# Patient Record
Sex: Male | Born: 1960 | Race: White | Hispanic: No | Marital: Married | Smoking: Former smoker
Health system: Southern US, Community
[De-identification: ages and names within clinical notes are randomized; demographics above are authoritative.]

## PROBLEM LIST (undated history)

## (undated) DIAGNOSIS — I10 Essential (primary) hypertension: Secondary | ICD-10-CM

## (undated) DIAGNOSIS — J309 Allergic rhinitis, unspecified: Secondary | ICD-10-CM

## (undated) DIAGNOSIS — F329 Major depressive disorder, single episode, unspecified: Secondary | ICD-10-CM

## (undated) DIAGNOSIS — I639 Cerebral infarction, unspecified: Secondary | ICD-10-CM

## (undated) DIAGNOSIS — F988 Other specified behavioral and emotional disorders with onset usually occurring in childhood and adolescence: Secondary | ICD-10-CM

## (undated) DIAGNOSIS — N189 Chronic kidney disease, unspecified: Secondary | ICD-10-CM

## (undated) HISTORY — DX: Other specified behavioral and emotional disorders with onset usually occurring in childhood and adolescence: F98.8

## (undated) HISTORY — DX: Allergic rhinitis, unspecified: J30.9

---
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
## 2001-11-27 HISTORY — PX: INGUINAL HERNIA REPAIR: SUR1180

## 2002-10-20 ENCOUNTER — Ambulatory Visit (HOSPITAL_COMMUNITY): Admission: RE | Admit: 2002-10-20 | Discharge: 2002-10-20 | Payer: Self-pay | Admitting: General Surgery

## 2004-12-11 ENCOUNTER — Observation Stay (HOSPITAL_COMMUNITY): Admission: EM | Admit: 2004-12-11 | Discharge: 2004-12-12 | Payer: Self-pay | Admitting: Emergency Medicine

## 2012-09-06 ENCOUNTER — Encounter: Payer: Self-pay | Admitting: Internal Medicine

## 2012-09-06 DIAGNOSIS — J309 Allergic rhinitis, unspecified: Secondary | ICD-10-CM | POA: Insufficient documentation

## 2012-09-06 DIAGNOSIS — F988 Other specified behavioral and emotional disorders with onset usually occurring in childhood and adolescence: Secondary | ICD-10-CM | POA: Insufficient documentation

## 2012-09-06 DIAGNOSIS — Z0000 Encounter for general adult medical examination without abnormal findings: Secondary | ICD-10-CM | POA: Insufficient documentation

## 2012-09-20 ENCOUNTER — Encounter: Payer: Self-pay | Admitting: Internal Medicine

## 2012-09-20 ENCOUNTER — Other Ambulatory Visit (INDEPENDENT_AMBULATORY_CARE_PROVIDER_SITE_OTHER): Payer: BC Managed Care – PPO

## 2012-09-20 ENCOUNTER — Ambulatory Visit (INDEPENDENT_AMBULATORY_CARE_PROVIDER_SITE_OTHER): Payer: BC Managed Care – PPO | Admitting: Internal Medicine

## 2012-09-20 VITALS — BP 122/90 | HR 96 | Temp 97.0°F | Ht 72.0 in | Wt 231.4 lb

## 2012-09-20 DIAGNOSIS — Z0000 Encounter for general adult medical examination without abnormal findings: Secondary | ICD-10-CM

## 2012-09-20 DIAGNOSIS — Z8349 Family history of other endocrine, nutritional and metabolic diseases: Secondary | ICD-10-CM | POA: Insufficient documentation

## 2012-09-20 DIAGNOSIS — R5383 Other fatigue: Secondary | ICD-10-CM

## 2012-09-20 DIAGNOSIS — Z23 Encounter for immunization: Secondary | ICD-10-CM

## 2012-09-20 DIAGNOSIS — R5381 Other malaise: Secondary | ICD-10-CM

## 2012-09-20 LAB — BASIC METABOLIC PANEL
BUN: 16 mg/dL (ref 6–23)
Calcium: 9.3 mg/dL (ref 8.4–10.5)
GFR: 76.84 mL/min (ref >60.00)
Glucose, Bld: 80 mg/dL (ref 70–99)

## 2012-09-20 LAB — CBC WITH DIFFERENTIAL/PLATELET
Eosinophils Relative: 3.1 % (ref 0.0–5.0)
HCT: 49.5 % (ref 39.0–52.0)
Hemoglobin: 16.4 g/dL (ref 13.0–17.0)
Lymphs Abs: 2.3 10*3/uL (ref 0.7–4.0)
Monocytes Relative: 10.3 % (ref 3.0–12.0)
Platelets: 242 10*3/uL (ref 150.0–400.0)
WBC: 8.4 10*3/uL (ref 4.5–10.5)

## 2012-09-20 LAB — LIPID PANEL
Cholesterol: 176 mg/dL (ref 0–200)
HDL: 33.8 mg/dL — ABNORMAL LOW (ref >39.00)
Triglycerides: 174 mg/dL — ABNORMAL HIGH (ref 0.0–149.0)
VLDL: 34.8 mg/dL (ref 0.0–40.0)

## 2012-09-20 LAB — URINALYSIS, ROUTINE W REFLEX MICROSCOPIC
Ketones, ur: NEGATIVE
Leukocytes, UA: NEGATIVE
Nitrite: NEGATIVE
Specific Gravity, Urine: 1.02 (ref 1.000–1.030)
pH: 6.5 (ref 5.0–8.0)

## 2012-09-20 LAB — TSH: TSH: 1.23 u[IU]/mL (ref 0.35–5.50)

## 2012-09-20 LAB — IBC PANEL: Iron: 82 ug/dL (ref 42–165)

## 2012-09-20 LAB — PSA: PSA: 0.6 ng/mL (ref 0.10–4.00)

## 2012-09-20 LAB — HEPATIC FUNCTION PANEL
AST: 20 U/L (ref 0–37)
Albumin: 3.9 g/dL (ref 3.5–5.2)

## 2012-09-20 MED ORDER — ASPIRIN 81 MG PO TBEC: 81.0000 mg | DELAYED_RELEASE_TABLET | Freq: Every day | ORAL | Status: DC

## 2012-09-20 NOTE — Assessment & Plan Note (Signed)

## 2012-09-20 NOTE — Assessment & Plan Note (Addendum)
Father history - Ok to check iron/ferritin

## 2012-09-20 NOTE — Assessment & Plan Note (Signed)
Etiology unclear, Exam otherwise benign, to check labs as documented, follow with expectant management, for testosterone check

## 2012-09-20 NOTE — Patient Instructions (Addendum)
Please start the Aspirin 81 mg - 1 per day - Coated only Continue all other medications as before Please keep your appointments with your specialists as you have planned - Dr Woodfin Ganja had the flu shot and tetanus shots today You will be contacted regarding the referral for: colonoscopy Please go to LAB in the Basement for the blood and/or urine tests to be done today You will be contacted by phone if any changes need to be made immediately.  Otherwise, you will receive a letter about your results with an explanation. Please remember to sign up for My Chart at your earliest convenience, as this will be important to you in the future with finding out test results. Please return in 1 year for your yearly visit, or sooner if needed, with Lab testing done 3-5 days before

## 2012-09-21 ENCOUNTER — Encounter: Payer: Self-pay | Admitting: Internal Medicine

## 2012-09-21 NOTE — Progress Notes (Signed)
Subjective:    Patient ID: Micheal Ayers, male    DOB: May 31, 1961, 51 y.o.   MRN: [id number]  HPI Here for wellness and f/u;  Overall doing ok;  Pt denies CP, worsening SOB, DOE, wheezing, orthopnea, PND, worsening LE edema, palpitations, dizziness or syncope.  Pt denies neurological change such as new Headache, facial or extremity weakness.  Pt denies polydipsia, polyuria, or low sugar symptoms. Pt states overall good compliance with treatment and medications, good tolerability, and trying to follow lower cholesterol diet.  Pt denies worsening depressive symptoms, suicidal ideation or panic. No fever, wt loss, night sweats, loss of appetite, or other constitutional symptoms.  Pt states good ability with ADL's, low fall risk, home safety reviewed and adequate, no significant changes in hearing or vision, and occasionally active with exercise.  Has not seen an MD for many yrs, except Dr Evelene Croon for ADD.  Does have sense of ongoing fatigue, but denies signficant hypersomnolence. Past Medical History  Diagnosis Date  . ADD (attention deficit disorder)   . Allergic rhinitis, cause unspecified    Past Surgical History  Procedure Date  . Elbow surgury 1969    left elbow  . Inguinal hernia repair 2003    left    reports that he has quit smoking. He has never used smokeless tobacco. He reports that he drinks alcohol. He reports that he does not use illicit drugs. family history includes Alcohol abuse in his others; Arthritis in his other; Cancer in his others; Hypertension in his others and unspecified family member; Kidney disease in his other; and Stroke in his other. Allergies  Allergen Reactions  . Erythromycin    Current Outpatient Prescriptions on File Prior to Visit  Medication Sig Dispense Refill  . atomoxetine (STRATTERA) 60 MG capsule Take 60 mg by mouth 2 (two) times daily.       Review of Systems Review of Systems  Constitutional: Negative for diaphoresis, activity change, appetite  change and unexpected weight change.  HENT: Negative for hearing loss, ear pain, facial swelling, mouth sores and neck stiffness.   Eyes: Negative for pain, redness and visual disturbance.  Respiratory: Negative for shortness of breath and wheezing.   Cardiovascular: Negative for chest pain and palpitations.  Gastrointestinal: Negative for diarrhea, blood in stool, abdominal distention and rectal pain.  Genitourinary: Negative for hematuria, flank pain and decreased urine volume.  Musculoskeletal: Negative for myalgias and joint swelling.  Skin: Negative for color change and wound.  Neurological: Negative for syncope and numbness.  Hematological: Negative for adenopathy.  Psychiatric/Behavioral: Negative for hallucinations, self-injury, decreased concentration and agitation.      Objective:   Physical Exam BP 122/90  Pulse 96  Temp 97 F (36.1 C) (Oral)  Ht 6' (1.829 m)  Wt 231 lb 6 oz (104.951 kg)  BMI 31.38 kg/m2  SpO2 97% Physical Exam  VS noted Constitutional: Pt is oriented to person, place, and time. Appears well-developed and well-nourished.  HENT:  Head: Normocephalic and atraumatic.  Right Ear: External ear normal.  Left Ear: External ear normal.  Nose: Nose normal.  Mouth/Throat: Oropharynx is clear and moist.  Eyes: Conjunctivae and EOM are normal. Pupils are equal, round, and reactive to light.  Neck: Normal range of motion. Neck supple. No JVD present. No tracheal deviation present.  Cardiovascular: Normal rate, regular rhythm, normal heart sounds and intact distal pulses.   Pulmonary/Chest: Effort normal and breath sounds normal.  Abdominal: Soft. Bowel sounds are normal. There is no tenderness.  Musculoskeletal: Normal range of motion. Exhibits no edema.  Lymphadenopathy:  Has no cervical adenopathy.  Neurological: Pt is alert and oriented to person, place, and time. Pt has normal reflexes. No cranial nerve deficit.  Skin: Skin is warm and dry. No rash noted.    Psychiatric:  Has  normal mood and affect. Behavior is normal.     Assessment & Plan:

## 2013-09-22 ENCOUNTER — Encounter: Payer: BC Managed Care – PPO | Admitting: Internal Medicine

## 2017-02-09 DIAGNOSIS — F900 Attention-deficit hyperactivity disorder, predominantly inattentive type: Secondary | ICD-10-CM | POA: Diagnosis not present

## 2017-06-15 DIAGNOSIS — E291 Testicular hypofunction: Secondary | ICD-10-CM | POA: Diagnosis not present

## 2017-06-20 DIAGNOSIS — E291 Testicular hypofunction: Secondary | ICD-10-CM | POA: Diagnosis not present

## 2017-07-06 DIAGNOSIS — E291 Testicular hypofunction: Secondary | ICD-10-CM | POA: Diagnosis not present

## 2017-08-14 DIAGNOSIS — Z23 Encounter for immunization: Secondary | ICD-10-CM | POA: Diagnosis not present

## 2017-08-14 DIAGNOSIS — Z79899 Other long term (current) drug therapy: Secondary | ICD-10-CM | POA: Diagnosis not present

## 2017-08-14 DIAGNOSIS — I10 Essential (primary) hypertension: Secondary | ICD-10-CM | POA: Diagnosis not present

## 2017-08-14 DIAGNOSIS — E291 Testicular hypofunction: Secondary | ICD-10-CM | POA: Diagnosis not present

## 2018-01-31 DIAGNOSIS — F900 Attention-deficit hyperactivity disorder, predominantly inattentive type: Secondary | ICD-10-CM | POA: Diagnosis not present

## 2018-05-02 DIAGNOSIS — M79606 Pain in leg, unspecified: Secondary | ICD-10-CM | POA: Diagnosis not present

## 2018-05-02 DIAGNOSIS — M25551 Pain in right hip: Secondary | ICD-10-CM | POA: Diagnosis not present

## 2018-05-02 DIAGNOSIS — R5383 Other fatigue: Secondary | ICD-10-CM | POA: Diagnosis not present

## 2018-05-02 DIAGNOSIS — M6281 Muscle weakness (generalized): Secondary | ICD-10-CM | POA: Diagnosis not present

## 2018-05-07 ENCOUNTER — Other Ambulatory Visit: Payer: Self-pay | Admitting: Family Medicine

## 2018-05-07 DIAGNOSIS — R5381 Other malaise: Secondary | ICD-10-CM

## 2018-05-07 DIAGNOSIS — Z87891 Personal history of nicotine dependence: Secondary | ICD-10-CM

## 2018-05-13 ENCOUNTER — Ambulatory Visit
Admission: RE | Admit: 2018-05-13 | Discharge: 2018-05-13 | Disposition: A | Discharge status: Home / Self Care | Payer: BLUE CROSS/BLUE SHIELD | Source: Ambulatory Visit | Attending: Nurse Practitioner | Admitting: Nurse Practitioner

## 2018-05-13 DIAGNOSIS — I714 Abdominal aortic aneurysm, without rupture: Secondary | ICD-10-CM | POA: Diagnosis not present

## 2018-05-13 DIAGNOSIS — Z87891 Personal history of nicotine dependence: Secondary | ICD-10-CM

## 2018-05-14 DIAGNOSIS — M722 Plantar fascial fibromatosis: Secondary | ICD-10-CM | POA: Diagnosis not present

## 2018-05-14 DIAGNOSIS — R202 Paresthesia of skin: Secondary | ICD-10-CM | POA: Diagnosis not present

## 2018-05-14 DIAGNOSIS — R29898 Other symptoms and signs involving the musculoskeletal system: Secondary | ICD-10-CM | POA: Diagnosis not present

## 2018-05-14 DIAGNOSIS — M79604 Pain in right leg: Secondary | ICD-10-CM | POA: Diagnosis not present

## 2018-05-15 ENCOUNTER — Other Ambulatory Visit: Payer: Self-pay | Admitting: Family Medicine

## 2018-05-17 ENCOUNTER — Other Ambulatory Visit: Payer: Self-pay | Admitting: Family Medicine

## 2018-05-17 DIAGNOSIS — R29898 Other symptoms and signs involving the musculoskeletal system: Secondary | ICD-10-CM

## 2018-06-06 DIAGNOSIS — M79606 Pain in leg, unspecified: Secondary | ICD-10-CM | POA: Diagnosis not present

## 2018-06-06 DIAGNOSIS — R030 Elevated blood-pressure reading, without diagnosis of hypertension: Secondary | ICD-10-CM | POA: Diagnosis not present

## 2018-06-09 ENCOUNTER — Ambulatory Visit
Admission: RE | Admit: 2018-06-09 | Discharge: 2018-06-09 | Disposition: A | Discharge status: Home / Self Care | Payer: BLUE CROSS/BLUE SHIELD | Source: Ambulatory Visit | Attending: Pediatric Pulmonology | Admitting: Pediatric Pulmonology

## 2018-06-09 DIAGNOSIS — M48061 Spinal stenosis, lumbar region without neurogenic claudication: Secondary | ICD-10-CM | POA: Diagnosis not present

## 2018-06-09 DIAGNOSIS — R29898 Other symptoms and signs involving the musculoskeletal system: Secondary | ICD-10-CM

## 2018-06-09 DIAGNOSIS — M5116 Intervertebral disc disorders with radiculopathy, lumbar region: Secondary | ICD-10-CM | POA: Diagnosis not present

## 2018-06-09 MED ORDER — GADOBENATE DIMEGLUMINE 529 MG/ML IV SOLN
20.0000 mL | Freq: Once | INTRAVENOUS | Status: AC | PRN
  Administered 2018-06-09: 20 mL via INTRAVENOUS

## 2018-07-10 DIAGNOSIS — R030 Elevated blood-pressure reading, without diagnosis of hypertension: Secondary | ICD-10-CM | POA: Diagnosis not present

## 2018-07-10 DIAGNOSIS — M5416 Radiculopathy, lumbar region: Secondary | ICD-10-CM | POA: Diagnosis not present

## 2018-07-10 DIAGNOSIS — Z6832 Body mass index (BMI) 32.0-32.9, adult: Secondary | ICD-10-CM | POA: Diagnosis not present

## 2018-07-20 IMAGING — MR MR LUMBAR SPINE WO/W CM
4 of 7 series · 16 of 48 positions shown · IV contrast (multihance)
Comparison: None.

CLINICAL DATA: Bilateral low back pain radiating to the right leg.

EXAM:
MRI LUMBAR SPINE WITHOUT AND WITH CONTRAST
TECHNIQUE: Multiplanar and multiecho pulse sequences of the lumbar spine were
obtained without and with intravenous contrast.
CONTRAST:  20mL MULTIHANCE GADOBENATE DIMEGLUMINE 529 MG/ML IV SOLN

[Series 6: T1 · sagittal · 4.0mm · 0.73mm/px · 3 of 15 slices shown (1 of 2)]
[im 1/15]
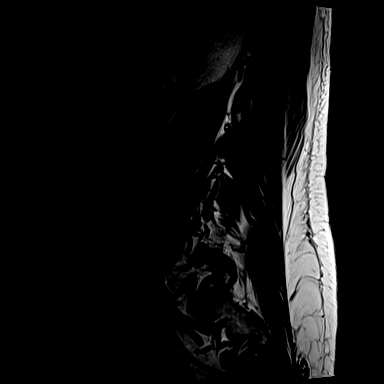
[im 8/15]
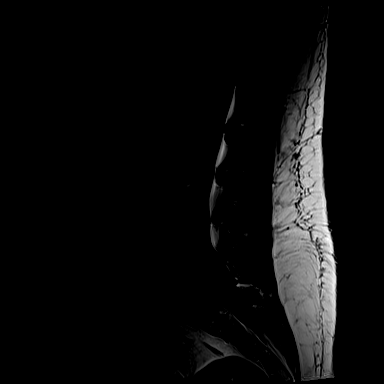
[im 15/15]
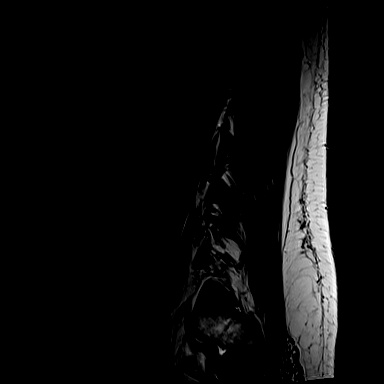

[Series 10: T1 · axial · 4.0mm · 0.31mm/px · z∈[-91,+96]mm · 3 of 41 slices shown (2 of 2)]
[im 5/41]
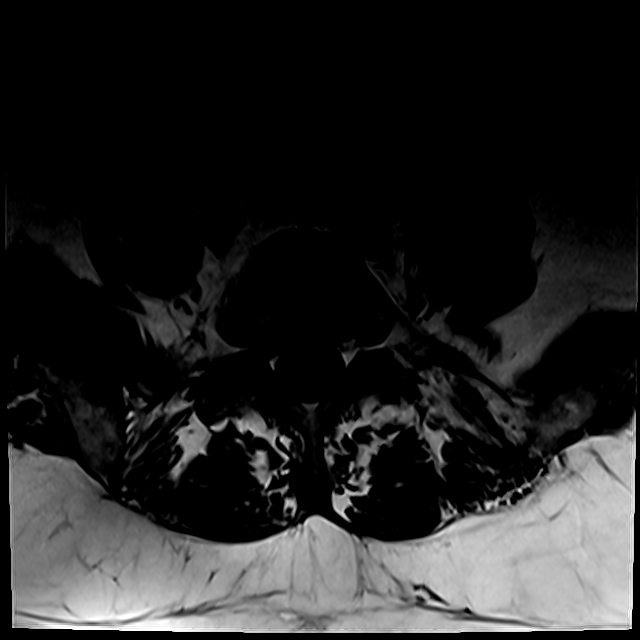
[im 21/41]
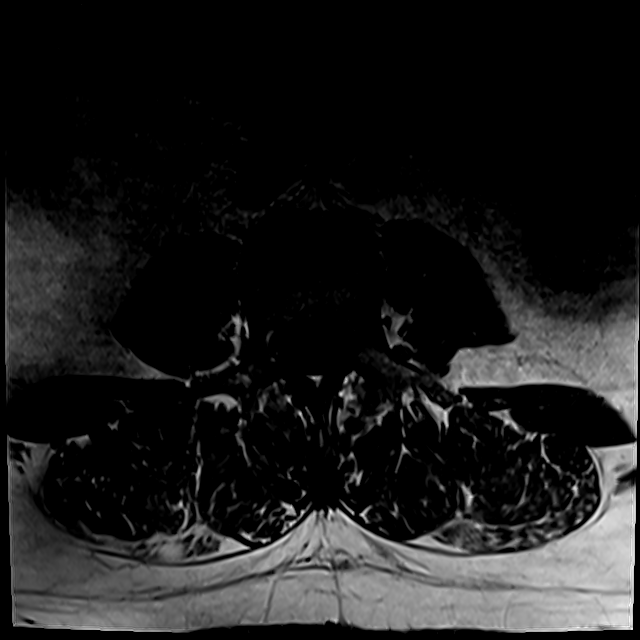
[im 37/41]
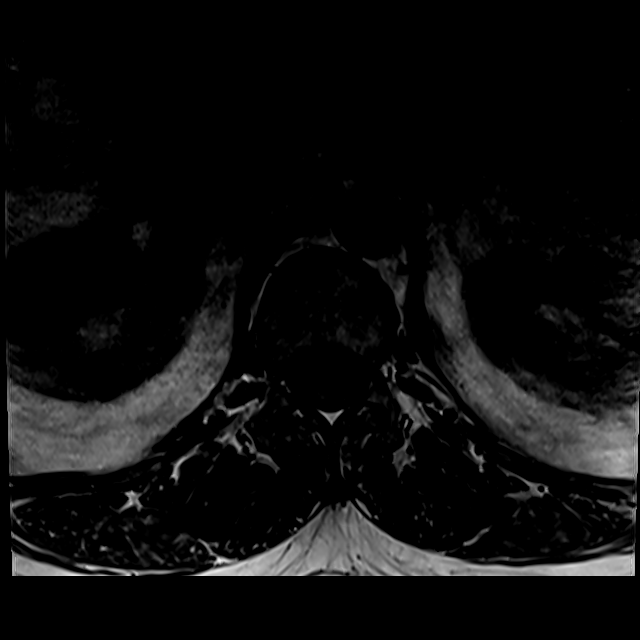

[Series 13: T2 · axial · 4.0mm · 0.31mm/px · z∈[-111,+96]mm · 7 of 41 slices shown (1 of 2)]
[im 1/41]
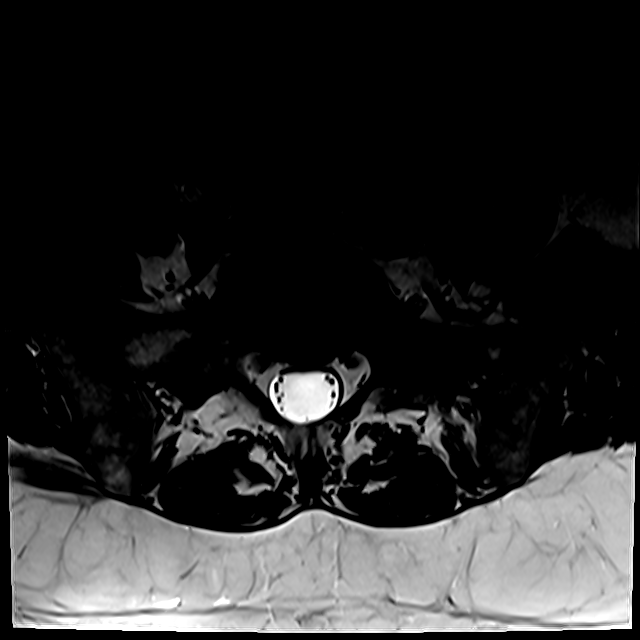
[im 5/41]
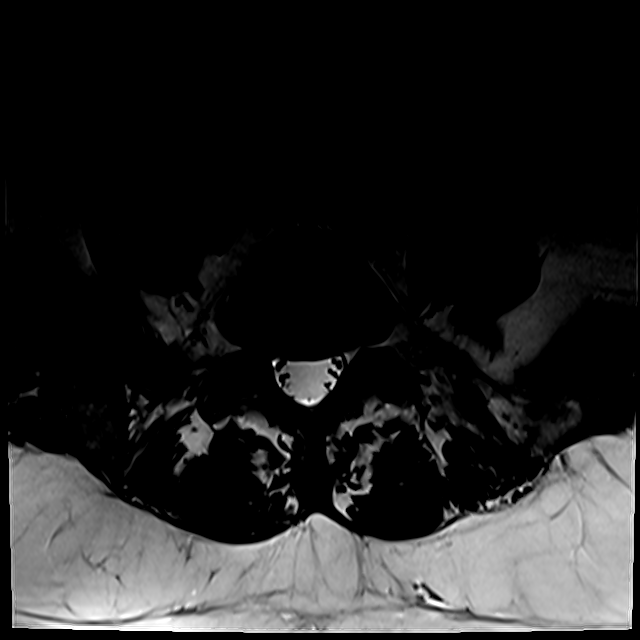
[im 9/41]
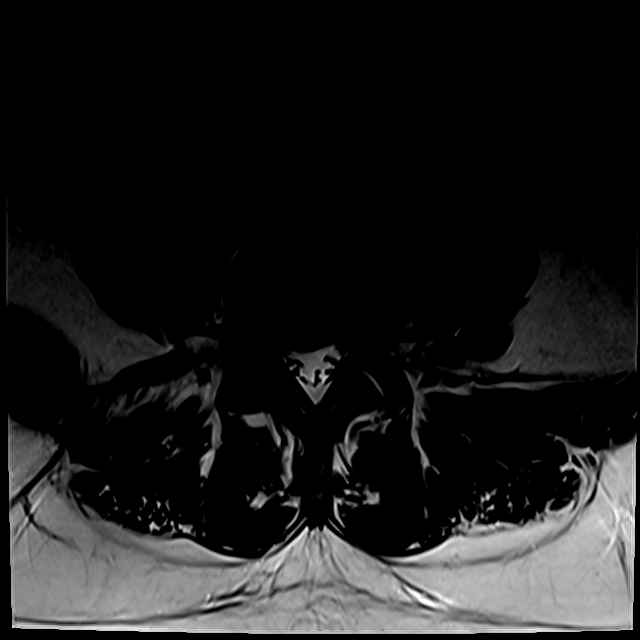
[im 13/41]
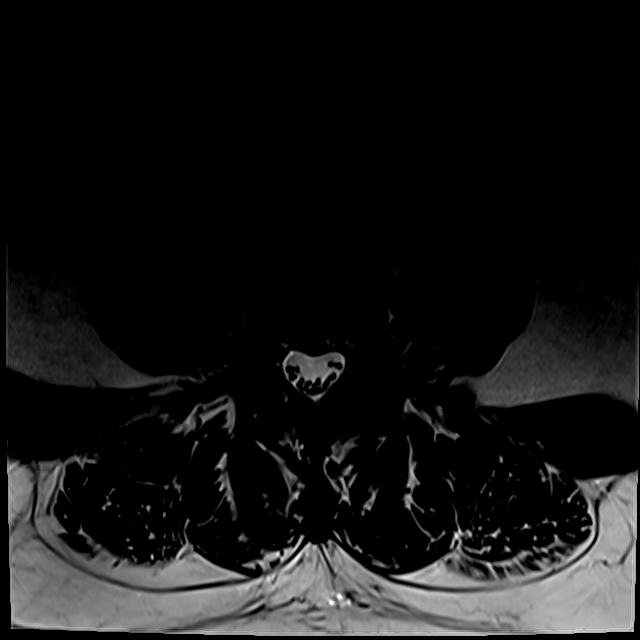
[im 17/41]
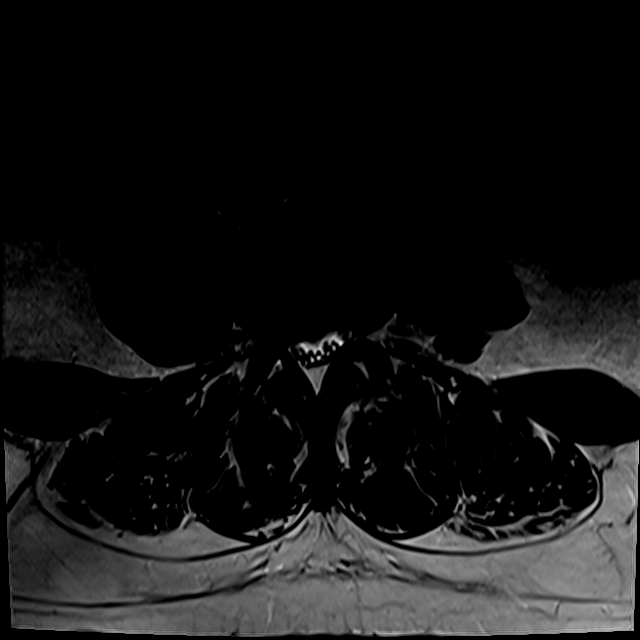
[im 21/41]
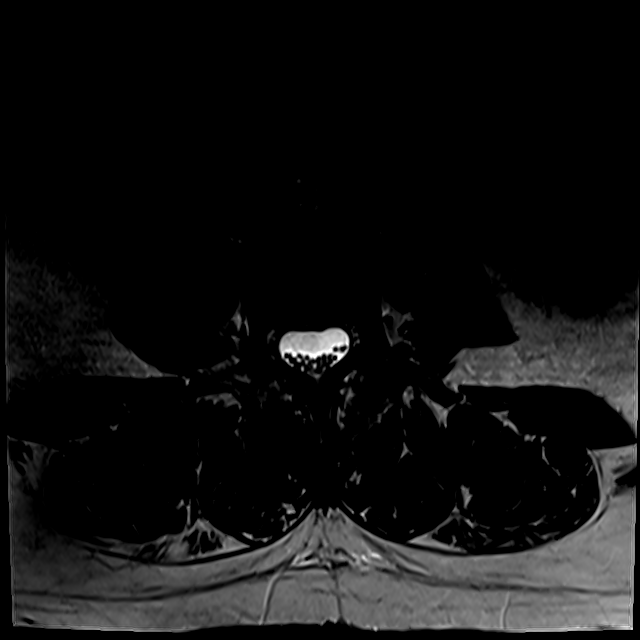
[im 37/41]
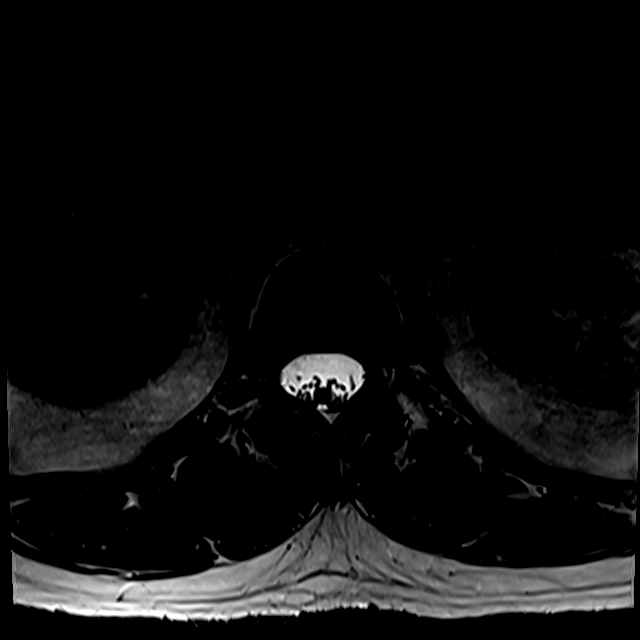

[Series 14: T2 · sagittal · 4.0mm · 0.73mm/px · 3 of 15 slices shown (2 of 2)]
[im 1/15]
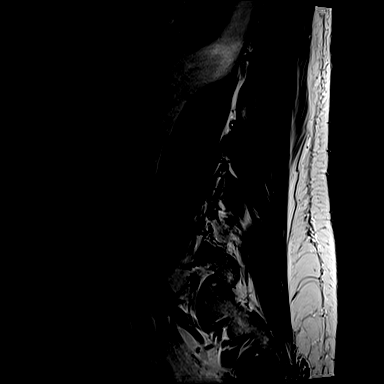
[im 10/15]
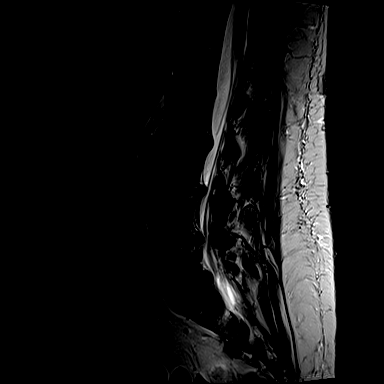
[im 15/15]
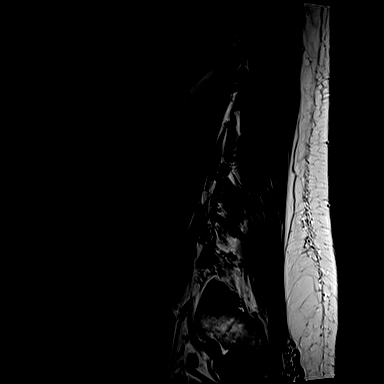

[16 of 48 positions shown; findings below may reference images not displayed]

FINDINGS: Segmentation: Normal. The lowest disc space is considered to be
L5-S1.

Alignment:  Normal

Vertebrae: No acute compression fracture, discitis-osteomyelitis of
focal marrow lesion.

Conus medullaris and cauda equina: The conus medullaris terminates
at the L1 level. The cauda equina and conus medullaris are both
normal.

Paraspinal and other soft tissues: The visualized aorta, IVC and
iliac vessels are normal. The visualized retroperitoneal organs and
paraspinal soft tissues are normal.

Disc levels:

The T12-L2 disc levels are normal.

L2-L3: Small left subarticular disc protrusion narrowing the left
lateral recess. No central spinal canal or neural foraminal
stenosis.

L3-L4: Medium-sized central disc protrusion narrows both lateral
recesses, right greater than left. Mild spinal canal stenosis. No
neural foraminal stenosis.

L4-L5: Small central disc extrusion with inferior migration mildly
narrowing the right lateral recess. No central spinal canal
stenosis. No neural foraminal stenosis.

L5-S1: Small disc bulge, right eccentric. Mild right foraminal
stenosis.

The visualized sacrum is normal.
IMPRESSION: 1. L3-L4 mild spinal canal stenosis with bilateral lateral recess
narrowing, right greater than left. Correlate for corresponding L4
radiculopathy.
2. L4-5 central disc extrusion narrowing the right lateral recess.

## 2018-08-08 DIAGNOSIS — M5416 Radiculopathy, lumbar region: Secondary | ICD-10-CM | POA: Diagnosis not present

## 2018-09-12 DIAGNOSIS — E559 Vitamin D deficiency, unspecified: Secondary | ICD-10-CM | POA: Diagnosis not present

## 2018-09-12 DIAGNOSIS — Z13220 Encounter for screening for lipoid disorders: Secondary | ICD-10-CM | POA: Diagnosis not present

## 2018-09-12 DIAGNOSIS — F900 Attention-deficit hyperactivity disorder, predominantly inattentive type: Secondary | ICD-10-CM | POA: Diagnosis not present

## 2018-09-12 DIAGNOSIS — Z79899 Other long term (current) drug therapy: Secondary | ICD-10-CM | POA: Diagnosis not present

## 2018-09-12 DIAGNOSIS — Z0000 Encounter for general adult medical examination without abnormal findings: Secondary | ICD-10-CM | POA: Diagnosis not present

## 2019-01-18 DIAGNOSIS — F900 Attention-deficit hyperactivity disorder, predominantly inattentive type: Secondary | ICD-10-CM | POA: Diagnosis not present

## 2019-04-20 DIAGNOSIS — R040 Epistaxis: Secondary | ICD-10-CM | POA: Diagnosis not present

## 2019-04-22 DIAGNOSIS — R040 Epistaxis: Secondary | ICD-10-CM | POA: Diagnosis not present

## 2020-02-21 ENCOUNTER — Ambulatory Visit: Payer: BC Managed Care – PPO | Attending: Family Medicine

## 2020-02-21 DIAGNOSIS — Z23 Encounter for immunization: Secondary | ICD-10-CM

## 2020-02-21 NOTE — Progress Notes (Signed)
   Covid-19 Vaccination Clinic  Name:  Micheal Ayers    MRN: [id number] DOB: [birth year]  02/21/2020  Mr. Micheal Ayers was observed post Covid-19 immunization for 15 minutes without incident. He was provided with Vaccine Information Sheet and instruction to access the V-Safe system.   Mr. Micheal Ayers was instructed to call 911 with any severe reactions post vaccine: Marland Kitchen Difficulty breathing  . Swelling of face and throat  . A fast heartbeat  . A bad rash all over body  . Dizziness and weakness   Immunizations Administered    Name Date Dose VIS Date Route   Pfizer COVID-19 Vaccine 02/21/2020  4:20 PM 0.3 mL 11/07/2019 Intramuscular   Manufacturer: [employer], [employer]   Lot: [id number]   NDC: [address]-0

## 2020-03-22 ENCOUNTER — Ambulatory Visit: Payer: BC Managed Care – PPO | Attending: Internal Medicine

## 2020-03-22 DIAGNOSIS — Z23 Encounter for immunization: Secondary | ICD-10-CM

## 2020-03-22 NOTE — Progress Notes (Signed)
   Covid-19 Vaccination Clinic  Name:  Micheal Ayers    MRN: [id number] DOB: [birth year]  03/22/2020  Micheal Ayers was observed post Covid-19 immunization for 15 minutes without incident. He was provided with Vaccine Information Sheet and instruction to access the V-Safe system.   Micheal Ayers was instructed to call 911 with any severe reactions post vaccine: Marland Kitchen Difficulty breathing  . Swelling of face and throat  . A fast heartbeat  . A bad rash all over body  . Dizziness and weakness   Immunizations Administered    Name Date Dose VIS Date Route   Pfizer COVID-19 Vaccine 03/22/2020  8:11 AM 0.3 mL 01/21/2019 Intramuscular   Manufacturer: [employer], [employer]   Lot: [id number]   NDC: [address]-3

## 2020-04-21 DIAGNOSIS — F900 Attention-deficit hyperactivity disorder, predominantly inattentive type: Secondary | ICD-10-CM | POA: Diagnosis not present

## 2020-07-22 DIAGNOSIS — I10 Essential (primary) hypertension: Secondary | ICD-10-CM | POA: Diagnosis not present

## 2020-07-22 DIAGNOSIS — R5382 Chronic fatigue, unspecified: Secondary | ICD-10-CM | POA: Diagnosis not present

## 2020-07-22 DIAGNOSIS — G479 Sleep disorder, unspecified: Secondary | ICD-10-CM | POA: Diagnosis not present

## 2020-07-22 DIAGNOSIS — R1030 Lower abdominal pain, unspecified: Secondary | ICD-10-CM | POA: Diagnosis not present

## 2020-07-30 DIAGNOSIS — R5382 Chronic fatigue, unspecified: Secondary | ICD-10-CM | POA: Diagnosis not present

## 2020-07-30 DIAGNOSIS — I10 Essential (primary) hypertension: Secondary | ICD-10-CM | POA: Diagnosis not present

## 2020-07-30 DIAGNOSIS — R7301 Impaired fasting glucose: Secondary | ICD-10-CM | POA: Diagnosis not present

## 2020-07-30 DIAGNOSIS — Z125 Encounter for screening for malignant neoplasm of prostate: Secondary | ICD-10-CM | POA: Diagnosis not present

## 2020-07-30 DIAGNOSIS — Z79899 Other long term (current) drug therapy: Secondary | ICD-10-CM | POA: Diagnosis not present

## 2020-12-14 DIAGNOSIS — Z1152 Encounter for screening for COVID-19: Secondary | ICD-10-CM | POA: Diagnosis not present

## 2022-06-09 ENCOUNTER — Ambulatory Visit (INDEPENDENT_AMBULATORY_CARE_PROVIDER_SITE_OTHER): Payer: No Typology Code available for payment source | Admitting: Adult Health

## 2022-06-09 ENCOUNTER — Encounter: Payer: Self-pay | Admitting: Adult Health

## 2022-06-09 VITALS — BP 165/109

## 2022-06-09 DIAGNOSIS — F902 Attention-deficit hyperactivity disorder, combined type: Secondary | ICD-10-CM

## 2022-06-09 MED ORDER — STRATTERA 100 MG PO CAPS: 100.0000 mg | ORAL_CAPSULE | Freq: Every day | ORAL | 2 refills | Status: DC

## 2022-06-09 MED ORDER — ATOMOXETINE HCL 60 MG PO CAPS: 60.0000 mg | ORAL_CAPSULE | Freq: Every day | ORAL | 0 refills | Status: DC

## 2022-06-09 MED ORDER — ATOMOXETINE HCL 25 MG PO CAPS: 25.0000 mg | ORAL_CAPSULE | Freq: Every day | ORAL | 0 refills | Status: DC

## 2022-06-09 MED ORDER — ATOMOXETINE HCL 40 MG PO CAPS: 40.0000 mg | ORAL_CAPSULE | Freq: Every day | ORAL | 0 refills | Status: DC

## 2022-06-09 NOTE — Progress Notes (Signed)
[facility] MD/PA/NP Initial Note  06/09/2022 9:08 AM Micheal Ayers  MRN:  [id number]  Chief Complaint:   HPI:   Patient seen today for initial psychiatric evaluation.  Describes mood today as - Mood symptoms - denies depression and anxiety. Reports irritability. Reports some worry and rumination. Mood is "all over the map". Feels "blah" a lot of the time with an occasional "I'm having a good day".  Feels like his job has played a part in mood dysregulation. Also noting Covid as a precipitant to some mood issues. Currently taking Stratera at 100mg  for ADD. Was diagnosed with ADD through family doctor when he was between 52 and 41. Was trialed on different medications that didn't work - Adderall - Ritalin. Was switched to [facility] after that and has taken it for nearly 15 years. He was unable to take the generic version and and has been on the brand name for years. His insurance recently changed and they no longer cover the brand name and he is paying over $300 a month. Feels like he is unable to continue to afford the medication on his salary and is considering other options. Varying interest and motivation. Taking medications as prescribed.  Energy levels lower. Active, does not have a regular exercise routine. Enjoys some usual interests and activities. Married. Lives with wife - 37 year old daughter in college. Sister in [place]. Spending time with family. Appetite adequate. Weight gain. Sleeps well most nights. Averages 8 hours. Focus and concentration stable. Completing tasks. Managing aspects of household. Works full time. Denies SI or HI.  Denies AH or VH.  Previous medication trials:  Ritalin, Adderall  Visit Diagnosis:    ICD-10-CM   1. Attention deficit hyperactivity disorder (ADHD), combined type  F90.2       Past Psychiatric History: Denies psychiatric hospitalization.   Past Medical History:  Past Medical History:  Diagnosis Date   ADD (attention deficit disorder)     Allergic rhinitis, cause unspecified     Past Surgical History:  Procedure Laterality Date   elbow surgury  1969   left elbow   INGUINAL HERNIA REPAIR  2003   left    Family Psychiatric History: Denies any family history of mental illness.   Family History:  Family History  Problem Relation Age of Onset   Hypertension Other    Alcohol abuse Other    Hypertension Other    Kidney disease Other    Cancer Other    Alcohol abuse Other    Arthritis Other    Stroke Other    Hypertension Other    Cancer Other     Social History:  Social History   Socioeconomic History   Marital status: Married    Spouse name: Not on file   Number of children: Not on file   Years of education: Not on file   Highest education level: Not on file  Occupational History   Not on file  Tobacco Use   Smoking status: Former   Smokeless tobacco: Never  Substance and Sexual Activity   Alcohol use: Yes    Comment: social   Drug use: No   Sexual activity: Not on file  Other Topics Concern   Not on file  Social History Narrative   Not on file   Social Determinants of Health   Financial Resource Strain: Not on file  Food Insecurity: Not on file  Transportation Needs: Not on file  Physical Activity: Not on file  Stress: Not on file  Social Connections: Not on file    Allergies:  Allergies  Allergen Reactions   Erythromycin     Other reaction(s): hives    Metabolic Disorder Labs: No results found for: "HGBA1C", "MPG" No results found for: "PROLACTIN" Lab Results  Component Value Date   CHOL 176 09/20/2012   TRIG 174.0 (H) 09/20/2012   HDL 33.80 (L) 09/20/2012   CHOLHDL 5 09/20/2012   VLDL 34.8 09/20/2012   LDLCALC 107 (H) 09/20/2012   Lab Results  Component Value Date   TSH 1.23 09/20/2012    Therapeutic Level Labs: No results found for: "LITHIUM" No results found for: "VALPROATE" No results found for: "CBMZ"  Current Medications: Current Outpatient Medications   Medication Sig Dispense Refill   aspirin 81 MG EC tablet Take 1 tablet (81 mg total) by mouth daily. Swallow whole. 30 tablet 12   atomoxetine (STRATTERA) 60 MG capsule Take 60 mg by mouth 2 (two) times daily.     No current facility-administered medications for this visit.    Medication Side Effects: none  Orders placed this visit:  No orders of the defined types were placed in this encounter.   Psychiatric Specialty Exam:  Review of Systems  Musculoskeletal:  Negative for gait problem.  Neurological:  Negative for tremors.  Psychiatric/Behavioral:         Please refer to HPI    There were no vitals taken for this visit.There is no height or weight on file to calculate BMI.  General Appearance: Casual and Neat  Eye Contact:  Good  Speech:  Clear and Coherent and Normal Rate  Volume:  Normal  Mood:  Euthymic  Affect:  Appropriate and Congruent  Thought Process:  Coherent and Descriptions of Associations: Intact  Orientation:  Full (Time, Place, and Person)  Thought Content: Logical   Suicidal Thoughts:  No  Homicidal Thoughts:  No  Memory:  WNL  Judgement:  Good  Insight:  Good  Psychomotor Activity:  Normal  Concentration:  Concentration: Good  Recall:  Good  Fund of Knowledge: Good  Language: Good  Assets:  Communication Skills Desire for Improvement Financial Resources/Insurance Housing Intimacy Leisure Time Physical Health Resilience Social Support Talents/Skills Transportation Vocational/Educational  ADL's:  Intact  Cognition: WNL  Prognosis:  Good   Screenings:   Receiving Psychotherapy: No   Treatment Plan/Recommendations:  Plan:  PDMP reviewed  Stratera 100mg  daily - brand name only  Also sent in taper if brand not covered - 60mg  x 7, 40mg  x 7, 25mg  x 7, then d/c.  Consider Vyvanse if not covered  Talked about BP issues   Time spent with patient was 60 minutes. Greater than 50% of face to face time with patient was spent on  counseling and coordination of care.    RTC 4 weeks  Patient advised to contact office with any questions, adverse effects, or acute worsening in signs and symptoms.      , NP

## 2022-06-13 ENCOUNTER — Telehealth: Payer: Self-pay

## 2022-06-14 NOTE — Telephone Encounter (Signed)
I submitted a prior Authorization for pt's Micheal Ayers and it was approved through 06/14/2023, unfortunately his co-pay is over $300.00 per Karin Golden. Not sure there is anything to do to get it lowered.

## 2022-06-14 NOTE — Telephone Encounter (Signed)
Ok -can we follow up with him and see what he would like to do?

## 2022-06-22 NOTE — Telephone Encounter (Signed)
LVM to [facility]. PA approved 7/18.

## 2022-06-22 NOTE — Telephone Encounter (Signed)
Patient notified of PA and scripts.

## 2022-06-22 NOTE — Telephone Encounter (Signed)
Patient lvm inquiring about the status of the Strattera.   Contact per patients request at # [phone]

## 2022-07-06 ENCOUNTER — Ambulatory Visit: Payer: No Typology Code available for payment source | Admitting: Adult Health

## 2022-07-06 ENCOUNTER — Encounter: Payer: Self-pay | Admitting: Adult Health

## 2022-07-06 DIAGNOSIS — F902 Attention-deficit hyperactivity disorder, combined type: Secondary | ICD-10-CM

## 2022-07-06 MED ORDER — ATOMOXETINE HCL 60 MG PO CAPS: 60.0000 mg | ORAL_CAPSULE | Freq: Every day | ORAL | 5 refills | Status: DC

## 2022-07-06 NOTE — Progress Notes (Signed)
TILMAN MCCLAREN [id number] 10-06-1961 61 y.o.  Subjective:   Patient ID:  Micheal Ayers is a 61 y.o. (DOB 1961/08/05) male.  Chief Complaint: No chief complaint on file.   HPI Micheal Ayers presents to the office today for follow-up of ADD.  Describes mood today as "ok". Denies tearfulness. Mood symptoms - denies depression (here and there) and anxiety. Reports less irritability. Reports some worry and rumination. Mood is consistent. Stating "I'm doing alright". Continues to taper down on Stratera - now at 40mg  daily. Willing to do a trial of the generic 60mg  daily. Was diagnosed with ADD through family doctor when he was between 22 and 41. Was trialed on different medications that didn't work - Adderall - Ritalin. Was switched to [facility] after that Varying interest and motivation. Taking medications as prescribed.  Energy levels lower. Active, does not have a regular exercise routine. Enjoys some usual interests and activities. Married. Lives with wife - 49 year old daughter. Sister in [place]. Spending time with family. Appetite adequate. Weight loss - 5 to 10 pounds. Sleeps well most nights. Averages 8 hours. Focus and concentration "not as sharp with decline in [place]". Completing tasks. Managing aspects of household. Works full time. Denies SI or HI.  Denies AH or VH. Denies self harm. Denies substance use.  Previous medication trials:  Ritalin, Adderall     Review of Systems:  Review of Systems  Musculoskeletal:  Negative for gait problem.  Neurological:  Negative for tremors.  Psychiatric/Behavioral:         Please refer to HPI    Medications: I have reviewed the patient's current medications.  Current Outpatient Medications  Medication Sig Dispense Refill   aspirin 81 MG EC tablet Take 1 tablet (81 mg total) by mouth daily. Swallow whole. 30 tablet 12   atomoxetine (STRATTERA) 25 MG capsule Take 1 capsule (25 mg total) by mouth daily. 7 capsule 0    atomoxetine (STRATTERA) 40 MG capsule Take 1 capsule (40 mg total) by mouth daily. 7 capsule 0   atomoxetine (STRATTERA) 60 MG capsule Take 60 mg by mouth 2 (two) times daily.     atomoxetine (STRATTERA) 60 MG capsule Take 1 capsule (60 mg total) by mouth daily. 7 capsule 0   STRATTERA 100 MG capsule Take 1 capsule (100 mg total) by mouth daily. 30 capsule 2   No current facility-administered medications for this visit.    Medication Side Effects: None  Allergies:  Allergies  Allergen Reactions   Erythromycin     Other reaction(s): hives    Past Medical History:  Diagnosis Date   ADD (attention deficit disorder)    Allergic rhinitis, cause unspecified     Past Medical History, Surgical history, Social history, and Family history were reviewed and updated as appropriate.   Please see review of systems for further details on the patient's review from today.   Objective:   Physical Exam:  There were no vitals taken for this visit.  Physical Exam Constitutional:      General: He is not in acute distress. Musculoskeletal:        General: No deformity.  Neurological:     Mental Status: He is alert and oriented to person, place, and time.     Coordination: Coordination normal.  Psychiatric:        Attention and Perception: Attention and perception normal. He does not perceive auditory or visual hallucinations.        Mood and Affect: Mood  normal. Mood is not anxious or depressed. Affect is not labile, blunt, angry or inappropriate.        Speech: Speech normal.        Behavior: Behavior normal.        Thought Content: Thought content normal. Thought content is not paranoid or delusional. Thought content does not include homicidal or suicidal ideation. Thought content does not include homicidal or suicidal plan.        Cognition and Memory: Cognition and memory normal.        Judgment: Judgment normal.     Comments: Insight intact     Lab Review:     Component Value  Date/Time   NA 137 09/20/2012 1054   K 4.3 09/20/2012 1054   CL 103 09/20/2012 1054   CO2 26 09/20/2012 1054   GLUCOSE 80 09/20/2012 1054   BUN 16 09/20/2012 1054   CREATININE 1.1 09/20/2012 1054   CALCIUM 9.3 09/20/2012 1054   PROT 7.5 09/20/2012 1054   ALBUMIN 3.9 09/20/2012 1054   AST 20 09/20/2012 1054   ALT 24 09/20/2012 1054   ALKPHOS 80 09/20/2012 1054   BILITOT 0.6 09/20/2012 1054       Component Value Date/Time   WBC 8.4 09/20/2012 1054   RBC 5.54 09/20/2012 1054   HGB 16.4 09/20/2012 1054   HCT 49.5 09/20/2012 1054   PLT 242.0 09/20/2012 1054   MCV 89.3 09/20/2012 1054   MCHC 33.1 09/20/2012 1054   RDW 12.6 09/20/2012 1054   LYMPHSABS 2.3 09/20/2012 1054   MONOABS 0.9 09/20/2012 1054   EOSABS 0.3 09/20/2012 1054   BASOSABS 0.0 09/20/2012 1054    No results found for: "POCLITH", "LITHIUM"   No results found for: "PHENYTOIN", "PHENOBARB", "VALPROATE", "CBMZ"   .res Assessment: Plan:    Plan:  PDMP reviewed  D/C Stratera 100mg  daily - brand name only  Add Stratera 60mg  daily  Consider Vyvanse if not covered  Talked about BP issues  Time spent with patient was 20 minutes. Greater than 50% of face to face time with patient was spent on counseling and coordination of care.    RTC 4 weeks  Patient advised to contact office with any questions, adverse effects, or acute worsening in signs and symptoms.  There are no diagnoses linked to this encounter.   Please see After Visit Summary for patient specific instructions.  No future appointments.  No orders of the defined types were placed in this encounter.   -------------------------------

## 2022-07-07 ENCOUNTER — Ambulatory Visit: Payer: No Typology Code available for payment source | Admitting: Adult Health

## 2023-01-04 ENCOUNTER — Ambulatory Visit (INDEPENDENT_AMBULATORY_CARE_PROVIDER_SITE_OTHER): Payer: 59 | Admitting: Adult Health

## 2023-01-04 ENCOUNTER — Encounter: Payer: Self-pay | Admitting: Adult Health

## 2023-01-04 DIAGNOSIS — F902 Attention-deficit hyperactivity disorder, combined type: Secondary | ICD-10-CM | POA: Diagnosis not present

## 2023-01-04 MED ORDER — ATOMOXETINE HCL 60 MG PO CAPS: 60.0000 mg | ORAL_CAPSULE | Freq: Every day | ORAL | 5 refills | Status: DC

## 2023-01-04 NOTE — Progress Notes (Signed)
Micheal Ayers [id number] [birth year] 62 y.o.  Subjective:   Patient ID:  Micheal Ayers is a 62 y.o. (DOB [birth year]) male.  Chief Complaint: No chief complaint on file.   HPI Micheal Ayers presents to the office today for follow-up of ADD.  Describes mood today as "ok". Denies tearfulness. Mood symptoms - denies depression and anxiety. Reports  irritability - "time to time". Reports some worry and rumination. Mood is consistent. Stating "I'm doing good". Feels like the Stratera at 60mg  works well. Varying interest and motivation. Taking medications as prescribed.  Energy levels lower. Active, does not have a regular exercise routine. Enjoys some usual interests and activities. Married. Lives with wife - 72 year old daughter. Sister in [place]. Spending time with family. Appetite adequate. Weight stable. Sleeps well most nights. Averages 8 to 9 hours. Focus and concentration stable. Completing tasks. Managing aspects of household. Works full time. Denies SI or HI.  Denies AH or VH. Denies self harm. Denies substance use.  Previous medication trials:  Ritalin, Adderall   Review of Systems:  Review of Systems  Musculoskeletal:  Negative for gait problem.  Neurological:  Negative for tremors.  Psychiatric/Behavioral:         Please refer to HPI    Medications: I have reviewed the patient's current medications.  Current Outpatient Medications  Medication Sig Dispense Refill   aspirin 81 MG EC tablet Take 1 tablet (81 mg total) by mouth daily. Swallow whole. 30 tablet 12   atomoxetine (STRATTERA) 60 MG capsule Take 1 capsule (60 mg total) by mouth daily. 30 capsule 5   STRATTERA 100 MG capsule Take 1 capsule (100 mg total) by mouth daily. 30 capsule 2   No current facility-administered medications for this visit.    Medication Side Effects: None  Allergies:  Allergies  Allergen Reactions   Erythromycin     Other reaction(s): hives    Past Medical History:   Diagnosis Date   ADD (attention deficit disorder)    Allergic rhinitis, cause unspecified     Past Medical History, Surgical history, Social history, and Family history were reviewed and updated as appropriate.   Please see review of systems for further details on the patient's review from today.   Objective:   Physical Exam:  There were no vitals taken for this visit.  Physical Exam Constitutional:      General: He is not in acute distress. Musculoskeletal:        General: No deformity.  Neurological:     Mental Status: He is alert and oriented to person, place, and time.     Coordination: Coordination normal.  Psychiatric:        Attention and Perception: Attention and perception normal. He does not perceive auditory or visual hallucinations.        Mood and Affect: Mood normal. Mood is not anxious or depressed. Affect is not labile, blunt, angry or inappropriate.        Speech: Speech normal.        Behavior: Behavior normal.        Thought Content: Thought content normal. Thought content is not paranoid or delusional. Thought content does not include homicidal or suicidal ideation. Thought content does not include homicidal or suicidal plan.        Cognition and Memory: Cognition and memory normal.        Judgment: Judgment normal.     Comments: Insight intact     Lab Review:  Component Value Date/Time   NA 137 09/20/2012 1054   K 4.3 09/20/2012 1054   CL 103 09/20/2012 1054   CO2 26 09/20/2012 1054   GLUCOSE 80 09/20/2012 1054   BUN 16 09/20/2012 1054   CREATININE 1.1 09/20/2012 1054   CALCIUM 9.3 09/20/2012 1054   PROT 7.5 09/20/2012 1054   ALBUMIN 3.9 09/20/2012 1054   AST 20 09/20/2012 1054   ALT 24 09/20/2012 1054   ALKPHOS 80 09/20/2012 1054   BILITOT 0.6 09/20/2012 1054       Component Value Date/Time   WBC 8.4 09/20/2012 1054   RBC 5.54 09/20/2012 1054   HGB 16.4 09/20/2012 1054   HCT 49.5 09/20/2012 1054   PLT 242.0 09/20/2012 1054   MCV  89.3 09/20/2012 1054   MCHC 33.1 09/20/2012 1054   RDW 12.6 09/20/2012 1054   LYMPHSABS 2.3 09/20/2012 1054   MONOABS 0.9 09/20/2012 1054   EOSABS 0.3 09/20/2012 1054   BASOSABS 0.0 09/20/2012 1054    No results found for: "POCLITH", "LITHIUM"   No results found for: "PHENYTOIN", "PHENOBARB", "VALPROATE", "CBMZ"   .res Assessment: Plan:    Plan:  PDMP reviewed  Continue Stratera 60mg  daily  Time spent with patient was 15 minutes. Greater than 50% of face to face time with patient was spent on counseling and coordination of care.    RTC 6 months  Patient advised to contact office with any questions, adverse effects, or acute worsening in signs and symptoms.  There are no diagnoses linked to this encounter.   Please see After Visit Summary for patient specific instructions.  No future appointments.  No orders of the defined types were placed in this encounter.   -------------------------------

## 2023-02-18 ENCOUNTER — Other Ambulatory Visit: Payer: Self-pay

## 2023-02-18 ENCOUNTER — Emergency Department (HOSPITAL_BASED_OUTPATIENT_CLINIC_OR_DEPARTMENT_OTHER): Payer: 59

## 2023-02-18 ENCOUNTER — Emergency Department (HOSPITAL_BASED_OUTPATIENT_CLINIC_OR_DEPARTMENT_OTHER): Payer: 59 | Admitting: Radiology

## 2023-02-18 ENCOUNTER — Inpatient Hospital Stay (HOSPITAL_BASED_OUTPATIENT_CLINIC_OR_DEPARTMENT_OTHER)
Admission: EM | Admit: 2023-02-18 | Discharge: 2023-02-21 | DRG: 065 | Disposition: A | Discharge status: Home / Self Care | Payer: 59 | Attending: Internal Medicine | Admitting: Internal Medicine

## 2023-02-18 ENCOUNTER — Emergency Department (HOSPITAL_COMMUNITY): Payer: 59

## 2023-02-18 DIAGNOSIS — Z1152 Encounter for screening for COVID-19: Secondary | ICD-10-CM

## 2023-02-18 DIAGNOSIS — Z7982 Long term (current) use of aspirin: Secondary | ICD-10-CM

## 2023-02-18 DIAGNOSIS — R9389 Abnormal findings on diagnostic imaging of other specified body structures: Secondary | ICD-10-CM | POA: Diagnosis present

## 2023-02-18 DIAGNOSIS — R42 Dizziness and giddiness: Secondary | ICD-10-CM | POA: Diagnosis not present

## 2023-02-18 DIAGNOSIS — Z6833 Body mass index (BMI) 33.0-33.9, adult: Secondary | ICD-10-CM

## 2023-02-18 DIAGNOSIS — Z823 Family history of stroke: Secondary | ICD-10-CM

## 2023-02-18 DIAGNOSIS — N182 Chronic kidney disease, stage 2 (mild): Secondary | ICD-10-CM | POA: Diagnosis present

## 2023-02-18 DIAGNOSIS — F988 Other specified behavioral and emotional disorders with onset usually occurring in childhood and adolescence: Secondary | ICD-10-CM | POA: Diagnosis present

## 2023-02-18 DIAGNOSIS — G9389 Other specified disorders of brain: Secondary | ICD-10-CM | POA: Diagnosis present

## 2023-02-18 DIAGNOSIS — I160 Hypertensive urgency: Secondary | ICD-10-CM | POA: Diagnosis present

## 2023-02-18 DIAGNOSIS — R4781 Slurred speech: Secondary | ICD-10-CM | POA: Diagnosis present

## 2023-02-18 DIAGNOSIS — R270 Ataxia, unspecified: Secondary | ICD-10-CM | POA: Diagnosis present

## 2023-02-18 DIAGNOSIS — I129 Hypertensive chronic kidney disease with stage 1 through stage 4 chronic kidney disease, or unspecified chronic kidney disease: Secondary | ICD-10-CM | POA: Diagnosis present

## 2023-02-18 DIAGNOSIS — Z888 Allergy status to other drugs, medicaments and biological substances status: Secondary | ICD-10-CM

## 2023-02-18 DIAGNOSIS — Z87891 Personal history of nicotine dependence: Secondary | ICD-10-CM

## 2023-02-18 DIAGNOSIS — G912 (Idiopathic) normal pressure hydrocephalus: Secondary | ICD-10-CM | POA: Diagnosis present

## 2023-02-18 DIAGNOSIS — R918 Other nonspecific abnormal finding of lung field: Secondary | ICD-10-CM

## 2023-02-18 DIAGNOSIS — I63542 Cerebral infarction due to unspecified occlusion or stenosis of left cerebellar artery: Secondary | ICD-10-CM | POA: Diagnosis not present

## 2023-02-18 DIAGNOSIS — Z8249 Family history of ischemic heart disease and other diseases of the circulatory system: Secondary | ICD-10-CM

## 2023-02-18 DIAGNOSIS — I639 Cerebral infarction, unspecified: Secondary | ICD-10-CM

## 2023-02-18 DIAGNOSIS — I739 Peripheral vascular disease, unspecified: Secondary | ICD-10-CM | POA: Diagnosis present

## 2023-02-18 DIAGNOSIS — R3915 Urgency of urination: Secondary | ICD-10-CM | POA: Diagnosis present

## 2023-02-18 DIAGNOSIS — F419 Anxiety disorder, unspecified: Secondary | ICD-10-CM | POA: Diagnosis present

## 2023-02-18 DIAGNOSIS — E785 Hyperlipidemia, unspecified: Secondary | ICD-10-CM | POA: Diagnosis present

## 2023-02-18 DIAGNOSIS — E669 Obesity, unspecified: Secondary | ICD-10-CM | POA: Diagnosis present

## 2023-02-18 DIAGNOSIS — N179 Acute kidney failure, unspecified: Secondary | ICD-10-CM | POA: Diagnosis present

## 2023-02-18 LAB — COMPREHENSIVE METABOLIC PANEL
ALT: 16 U/L (ref 0–44)
AST: 14 U/L — ABNORMAL LOW (ref 15–41)
Albumin: 4.4 g/dL (ref 3.5–5.0)
Alkaline Phosphatase: 65 U/L (ref 38–126)
Anion gap: 7 (ref 5–15)
BUN: 29 mg/dL — ABNORMAL HIGH (ref 6–20)
CO2: 28 mmol/L (ref 22–32)
Calcium: 10.1 mg/dL (ref 8.9–10.3)
Chloride: 102 mmol/L (ref 98–111)
Creatinine, Ser: 1.41 mg/dL — ABNORMAL HIGH (ref 0.61–1.24)
GFR, Estimated: 57 mL/min — ABNORMAL LOW (ref >60)
Glucose, Bld: 144 mg/dL — ABNORMAL HIGH (ref 70–99)
Potassium: 3.7 mmol/L (ref 3.5–5.1)
Sodium: 137 mmol/L (ref 135–145)
Total Bilirubin: 0.6 mg/dL (ref 0.3–1.2)
Total Protein: 7.4 g/dL (ref 6.5–8.1)

## 2023-02-18 LAB — CBC WITH DIFFERENTIAL/PLATELET
Abs Immature Granulocytes: 0.03 10*3/uL (ref 0.00–0.07)
Basophils Absolute: 0.1 10*3/uL (ref 0.0–0.1)
Basophils Relative: 1 %
Eosinophils Absolute: 0.2 10*3/uL (ref 0.0–0.5)
Eosinophils Relative: 2 %
HCT: 48.5 % (ref 39.0–52.0)
Hemoglobin: 16.7 g/dL (ref 13.0–17.0)
Immature Granulocytes: 0 %
Lymphocytes Relative: 26 %
Lymphs Abs: 2.5 10*3/uL (ref 0.7–4.0)
MCH: 29.9 pg (ref 26.0–34.0)
MCHC: 34.4 g/dL (ref 30.0–36.0)
MCV: 86.9 fL (ref 80.0–100.0)
Monocytes Absolute: 0.9 10*3/uL (ref 0.1–1.0)
Monocytes Relative: 9 %
Neutro Abs: 6.1 10*3/uL (ref 1.7–7.7)
Neutrophils Relative %: 62 %
Platelets: 255 10*3/uL (ref 150–400)
RBC: 5.58 MIL/uL (ref 4.22–5.81)
RDW: 12.7 % (ref 11.5–15.5)
WBC: 9.8 10*3/uL (ref 4.0–10.5)
nRBC: 0 % (ref 0.0–0.2)

## 2023-02-18 LAB — TROPONIN I (HIGH SENSITIVITY): Troponin I (High Sensitivity): 4 ng/L (ref <18)

## 2023-02-18 LAB — TSH: TSH: 0.795 u[IU]/mL (ref 0.350–4.500)

## 2023-02-18 LAB — RESP PANEL BY RT-PCR (RSV, FLU A&B, COVID)  RVPGX2
Influenza A by PCR: NEGATIVE
Influenza B by PCR: NEGATIVE
Resp Syncytial Virus by PCR: NEGATIVE
SARS Coronavirus 2 by RT PCR: NEGATIVE

## 2023-02-18 LAB — T4, FREE: Free T4: 1.01 ng/dL (ref 0.61–1.12)

## 2023-02-18 MED ORDER — DOXYCYCLINE HYCLATE 100 MG PO CAPS: 100.0000 mg | ORAL_CAPSULE | Freq: Two times a day (BID) | ORAL | 0 refills | Status: AC

## 2023-02-18 MED ORDER — AMOXICILLIN-POT CLAVULANATE 875-125 MG PO TABS: 1.0000 | ORAL_TABLET | Freq: Two times a day (BID) | ORAL | 0 refills | Status: AC

## 2023-02-18 NOTE — Plan of Care (Signed)
Called by Dr. Billy Fischer at [facility] for this patient with multiple weeks of gait disturbance.  CT head concerning for severe ventriculomegaly.  Recommend MRI brain without contrast followed by large-volume tap.  If gait improves after large-volume tap, please consult neurosurgery for further evaluation for NPH.  Please call neurology with questions as needed once the patient has the MRI and if there are any questions or concerns.  -- Amie Portland, MD Neurologist [facility] Pager: [phone]

## 2023-02-18 NOTE — ED Notes (Signed)
Transferred from [facility] by POV with IV in left Ac for MRI. In visitor waiting area with wife

## 2023-02-18 NOTE — ED Provider Notes (Signed)
[facility] Provider Note   CSN: [id number] Arrival date & time: 02/18/23  1436     History  Chief Complaint  Patient presents with   Fatigue   Medical Clearance    Micheal Ayers is a 62 y.o. male.  HPI     62yo male with history of ADD, low testosterone, presents with concern for fatigue.   Started blood pressure medication on Thursday BP 99991111 systolic  Feeling unsteady, running into walls for 2 weeks  Last night like he didn't have enough energy to talk, slurred speech, seems better today Started BP medicine Thursday morning Not feeling any better  Both sensation of being off balance and fatigue.  Does not feel like going to pass out and not room spinning. Feels like could fall asleep at any time.  2 weeks ago had joint pain, was tested for RMSF and Lyme.  No known tick bites. 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  Urologist put him on medicine more than one month ago, has had one refill-maybe 6 weeks ago, addressing low T.  In 150s.   No chest pain, n/v/d, no known black or bloody stools but doesn't look, no fever, no cough, congestion\. No falls. No headache. No change in vision or drooping of face. At different times arms feel heavy.    Pain in knees, joints   Dyspnea at times, not consistent, no specific time, felt it yesterday for less than 30 seconds   Occ etoh, remote hx of smoking, no other drugs  Past Medical History:  Diagnosis Date   ADD (attention deficit disorder)    Allergic rhinitis, cause unspecified      Home Medications Prior to Admission medications   Medication Sig Start Date End Date Taking? Authorizing Provider  amoxicillin-clavulanate (AUGMENTIN) 875-125 MG tablet Take 1 tablet by mouth every 12 (twelve) hours for 7 days. 02/18/23 02/25/23 Yes Gareth Morgan, MD  doxycycline (VIBRAMYCIN) 100 MG capsule Take 1 capsule (100 mg total) by mouth 2 (two) times daily for 7 days.  02/18/23 02/25/23 Yes Gareth Morgan, MD  aspirin 81 MG EC tablet Take 1 tablet (81 mg total) by mouth daily. Swallow whole. 09/20/12   Biagio Borg, MD  atomoxetine (STRATTERA) 60 MG capsule Take 1 capsule (60 mg total) by mouth daily. 01/04/23   Mozingo, Berdie Ogren, NP      Allergies    Erythromycin    Review of Systems   Review of Systems  Physical Exam Updated Vital Signs BP (!) 153/106 (BP Location: Right Arm)   Pulse (!) 101   Temp 97.9 F (36.6 C)   Resp 18   Ht 5\' 10"  (1.778 m)   Wt 107 kg   SpO2 96%   BMI 33.86 kg/m  Physical Exam Vitals and nursing note reviewed.  Constitutional:      General: He is not in acute distress.    Appearance: Normal appearance. He is well-developed. He is not ill-appearing or diaphoretic.  HENT:     Head: Normocephalic and atraumatic.  Eyes:     General: No visual field deficit.    Extraocular Movements: Extraocular movements intact.     Conjunctiva/sclera: Conjunctivae normal.     Pupils: Pupils are equal, round, and reactive to light.  Cardiovascular:     Rate and Rhythm: Normal rate and regular rhythm.     Pulses: Normal pulses.     Heart sounds: Normal heart sounds. No murmur heard.  No friction rub. No gallop.  Pulmonary:     Effort: Pulmonary effort is normal. No respiratory distress.     Breath sounds: Normal breath sounds. No wheezing or rales.  Abdominal:     General: There is no distension.     Palpations: Abdomen is soft.     Tenderness: There is no abdominal tenderness. There is no guarding.  Musculoskeletal:        General: No swelling or tenderness.     Cervical back: Normal range of motion.  Skin:    General: Skin is warm and dry.     Findings: No erythema or rash.  Neurological:     General: No focal deficit present.     Mental Status: He is alert and oriented to person, place, and time.     GCS: GCS eye subscore is 4. GCS verbal subscore is 5. GCS motor subscore is 6.     Cranial Nerves: No cranial  nerve deficit, dysarthria or facial asymmetry.     Sensory: No sensory deficit.     Motor: No weakness or tremor.     Coordination: Coordination normal. Finger-Nose-Finger Test normal.     ED Results / Procedures / Treatments   Labs (all labs ordered are listed, but only abnormal results are displayed) Labs Reviewed  COMPREHENSIVE METABOLIC PANEL - Abnormal; Notable for the following components:      Result Value   Glucose, Bld 144 (*)    BUN 29 (*)    Creatinine, Ser 1.41 (*)    AST 14 (*)    GFR, Estimated 57 (*)    All other components within normal limits  RESP PANEL BY RT-PCR (RSV, FLU A&B, COVID)  RVPGX2  TSH  CBC WITH DIFFERENTIAL/PLATELET  T4, FREE  TROPONIN I (HIGH SENSITIVITY)  TROPONIN I (HIGH SENSITIVITY)    EKG EKG Interpretation  Date/Time:  Sunday February 18 2023 15:29:26 EDT Ventricular Rate:  84 PR Interval:  204 QRS Duration: 110 QT Interval:  368 QTC Calculation: 435 R Axis:   63 Text Interpretation: Sinus rhythm Borderline prolonged PR interval Left atrial enlargement Nonspecific TW changes Confirmed by Gareth Morgan [id number]) on 02/18/2023 4:51:25 PM  Radiology CT Head Wo Contrast  Result Date: 02/18/2023 CLINICAL DATA:  Neuro deficit, increased fatigue and weakness EXAM: CT HEAD WITHOUT CONTRAST TECHNIQUE: Contiguous axial images were obtained from the base of the skull through the vertex without intravenous contrast. RADIATION DOSE REDUCTION: This exam was performed according to the departmental dose-optimization program which includes automated exposure control, adjustment of the mA and/or kV according to patient size and/or use of iterative reconstruction technique. COMPARISON:  None Available. FINDINGS: Brain: Ventriculomegaly, without acute callosal angle or sulcal crowding at the vertex, favored to represent ex vacuo dilatation in the setting of diffuse cerebral volume loss, which is most prominent in the frontal and parietal lobes, with less  significant atrophy in the temporal and right occipital lobe. No evidence of acute infarction, hemorrhage, mass, mass effect, or midline shift. Sequela of remote infarct in the inferior left cerebellum. No extra-axial fluid collection. Partial empty sella. Normal craniocervical junction. Vascular: No hyperdense vessel. Skull: Negative for fracture or focal lesion. Sinuses/Orbits: No acute finding. Other: The mastoid air cells are well aerated. IMPRESSION: 1. No acute intracranial process. 2. Ventriculomegaly, favored to represent ex vacuo dilatation in the setting of diffuse cerebral volume loss, which is most prominent in the frontal and parietal lobes, with less significant atrophy in the temporal and right occipital lobe.  Electronically Signed   By: Merilyn Baba M.D.   On: 02/18/2023 16:47   DG Chest 2 View  Result Date: 02/18/2023 CLINICAL DATA:  Fatigue. EXAM: CHEST - 2 VIEW COMPARISON:  December 11, 2004. FINDINGS: The heart size and mediastinal contours are within normal limits. Right lung is clear. Left lingular opacity is noted concerning for pneumonia or atelectasis. Mild compression deformity of a midthoracic vertebral body is noted most consistent with old fracture. IMPRESSION: Left lingular opacity is noted most consistent with pneumonia or atelectasis. Followup PA and lateral chest X-ray is recommended in 3-4 weeks following trial of antibiotic therapy to ensure resolution and exclude underlying malignancy. Electronically Signed   By: Marijo Conception M.D.   On: 02/18/2023 16:21    Procedures Procedures    Medications Ordered in ED Medications - No data to display  ED Course/ Medical Decision Making/ A&P                               62yo male with history of ADD, low testosterone, presents with concern for fatigue, sensation of being off balance.  Differential diagnosis includes anemia, electrolyte abnormality, cardiac abnormality, infection, hypothyroidism, low testosterone, CVA,  ICH, other toxic/metabolic abnormalities.  Had outpatient testing negative for lyme/RMSF.  EKG completed and personally evaluated and interpreted by me with sinus rhythm, no acute ST changes.   Labs completed and personally about interpreted by me show no clinically significant anemia, electrolyte abnormalities.  Troponin evaluated given report of occasional feeling of bilateral arm heaviness was negative and have low suspicion for ACS.  Ordered a TSH which normal.  Was started on a medication by his urologist 6 weeks ago, unknown with this medication was unclear if this could be a contributor.  CXR shows left lingular opacity noted most consistent with pneumonia or atelectasis. Will plan on treating for pneumonia-sent rx for doxycycline and augmentin to pharmacy. Recommend XR in 3-4 weeks to evaluate for resolution.  Given sensation of being off balance, CT head was completed which shows ventriculomegaly, favored to be volume loss.    Discussed transfer to [facility] for MRI to evaluate for posterior circulation CVA in setting of sensation of disequilibrium. Do feel he is stable to go POV.  MRI ordered.   Discussed with Dr. Rory Percy, Neurology regarding ventriculomegaly/?NPH. Typically this is outpatient work up but recommends ambulation, LP, repeat ambulation to see if improvement, referral to [facility] if NPH concerns.  Can discuss further with Neurology following MRI.          Final Clinical Impression(s) / ED Diagnoses Final diagnoses:  Disequilibrium  Opacity of lung on imaging study  Cerebral ventriculomegaly    Rx / DC Orders ED Discharge Orders          Ordered    doxycycline (VIBRAMYCIN) 100 MG capsule  2 times daily        02/18/23 1709    amoxicillin-clavulanate (AUGMENTIN) 875-125 MG tablet  Every 12 hours        02/18/23 1709              Gareth Morgan, MD 02/18/23 1744

## 2023-02-18 NOTE — ED Triage Notes (Addendum)
Patient arrives with complaints of increased fatigue/weakness x2 weeks. He tested negative for [place]/Lyme disease. Reports chills earlier in the week as well.   He has also been started on bp meds due to hypertension (systolic in the [id number]).  Sent here for further workup and CT scan of the head due to feeling of balance.

## 2023-02-19 ENCOUNTER — Encounter (HOSPITAL_COMMUNITY): Payer: Self-pay | Admitting: Family Medicine

## 2023-02-19 ENCOUNTER — Observation Stay (HOSPITAL_COMMUNITY): Payer: 59

## 2023-02-19 DIAGNOSIS — G912 (Idiopathic) normal pressure hydrocephalus: Secondary | ICD-10-CM | POA: Diagnosis present

## 2023-02-19 DIAGNOSIS — G9389 Other specified disorders of brain: Secondary | ICD-10-CM

## 2023-02-19 DIAGNOSIS — I6389 Other cerebral infarction: Secondary | ICD-10-CM

## 2023-02-19 DIAGNOSIS — N182 Chronic kidney disease, stage 2 (mild): Secondary | ICD-10-CM | POA: Diagnosis present

## 2023-02-19 DIAGNOSIS — N179 Acute kidney failure, unspecified: Secondary | ICD-10-CM

## 2023-02-19 DIAGNOSIS — I739 Peripheral vascular disease, unspecified: Secondary | ICD-10-CM | POA: Diagnosis present

## 2023-02-19 DIAGNOSIS — R3915 Urgency of urination: Secondary | ICD-10-CM | POA: Diagnosis present

## 2023-02-19 DIAGNOSIS — F419 Anxiety disorder, unspecified: Secondary | ICD-10-CM | POA: Diagnosis present

## 2023-02-19 DIAGNOSIS — I160 Hypertensive urgency: Secondary | ICD-10-CM

## 2023-02-19 DIAGNOSIS — R42 Dizziness and giddiness: Secondary | ICD-10-CM | POA: Diagnosis present

## 2023-02-19 DIAGNOSIS — F988 Other specified behavioral and emotional disorders with onset usually occurring in childhood and adolescence: Secondary | ICD-10-CM | POA: Diagnosis present

## 2023-02-19 DIAGNOSIS — Z6833 Body mass index (BMI) 33.0-33.9, adult: Secondary | ICD-10-CM | POA: Diagnosis not present

## 2023-02-19 DIAGNOSIS — Z1152 Encounter for screening for COVID-19: Secondary | ICD-10-CM | POA: Diagnosis not present

## 2023-02-19 DIAGNOSIS — R9389 Abnormal findings on diagnostic imaging of other specified body structures: Secondary | ICD-10-CM

## 2023-02-19 DIAGNOSIS — I129 Hypertensive chronic kidney disease with stage 1 through stage 4 chronic kidney disease, or unspecified chronic kidney disease: Secondary | ICD-10-CM | POA: Diagnosis present

## 2023-02-19 DIAGNOSIS — I639 Cerebral infarction, unspecified: Secondary | ICD-10-CM | POA: Diagnosis not present

## 2023-02-19 DIAGNOSIS — Z87891 Personal history of nicotine dependence: Secondary | ICD-10-CM | POA: Diagnosis not present

## 2023-02-19 DIAGNOSIS — I63542 Cerebral infarction due to unspecified occlusion or stenosis of left cerebellar artery: Secondary | ICD-10-CM | POA: Diagnosis present

## 2023-02-19 DIAGNOSIS — E785 Hyperlipidemia, unspecified: Secondary | ICD-10-CM | POA: Diagnosis present

## 2023-02-19 DIAGNOSIS — R4781 Slurred speech: Secondary | ICD-10-CM | POA: Diagnosis present

## 2023-02-19 DIAGNOSIS — Z8249 Family history of ischemic heart disease and other diseases of the circulatory system: Secondary | ICD-10-CM | POA: Diagnosis not present

## 2023-02-19 DIAGNOSIS — R270 Ataxia, unspecified: Secondary | ICD-10-CM | POA: Diagnosis present

## 2023-02-19 DIAGNOSIS — Z7982 Long term (current) use of aspirin: Secondary | ICD-10-CM | POA: Diagnosis not present

## 2023-02-19 DIAGNOSIS — Z888 Allergy status to other drugs, medicaments and biological substances status: Secondary | ICD-10-CM | POA: Diagnosis not present

## 2023-02-19 DIAGNOSIS — E669 Obesity, unspecified: Secondary | ICD-10-CM | POA: Diagnosis present

## 2023-02-19 DIAGNOSIS — Z823 Family history of stroke: Secondary | ICD-10-CM | POA: Diagnosis not present

## 2023-02-19 LAB — HIV ANTIBODY (ROUTINE TESTING W REFLEX): HIV Screen 4th Generation wRfx: NONREACTIVE

## 2023-02-19 LAB — BASIC METABOLIC PANEL
Anion gap: 9 (ref 5–15)
BUN: 20 mg/dL (ref 6–20)
CO2: 25 mmol/L (ref 22–32)
Calcium: 9.2 mg/dL (ref 8.9–10.3)
Chloride: 104 mmol/L (ref 98–111)
Creatinine, Ser: 1 mg/dL (ref 0.61–1.24)
GFR, Estimated: >60 mL/min (ref >60)
Glucose, Bld: 107 mg/dL — ABNORMAL HIGH (ref 70–99)
Potassium: 3.9 mmol/L (ref 3.5–5.1)
Sodium: 138 mmol/L (ref 135–145)

## 2023-02-19 LAB — LIPID PANEL
Cholesterol: 176 mg/dL (ref 0–200)
HDL: 37 mg/dL — ABNORMAL LOW (ref >40)
LDL Cholesterol: 118 mg/dL — ABNORMAL HIGH (ref 0–99)
Total CHOL/HDL Ratio: 4.8 RATIO
Triglycerides: 107 mg/dL (ref <150)
VLDL: 21 mg/dL (ref 0–40)

## 2023-02-19 LAB — ECHOCARDIOGRAM COMPLETE
AR max vel: 3.11 cm2
AV Area VTI: 2.91 cm2
AV Area mean vel: 3.02 cm2
AV Mean grad: 3 mmHg
AV Peak grad: 5.7 mmHg
Ao pk vel: 1.19 m/s
Area-P 1/2: 3.1 cm2
Height: 70 in
MV VTI: 3.98 cm2
S' Lateral: 2.7 cm
Weight: 3776 oz

## 2023-02-19 LAB — PROCALCITONIN: Procalcitonin: <0.1 ng/mL

## 2023-02-19 LAB — TROPONIN I (HIGH SENSITIVITY)
Troponin I (High Sensitivity): 5 ng/L (ref <18)
Troponin I (High Sensitivity): 5 ng/L (ref <18)

## 2023-02-19 MED ORDER — ACETAMINOPHEN 650 MG RE SUPP: 650.0000 mg | RECTAL | Status: DC | PRN

## 2023-02-19 MED ORDER — ASPIRIN 81 MG PO TBEC
81.0000 mg | DELAYED_RELEASE_TABLET | Freq: Every day | ORAL | Status: DC
  Administered 2023-02-19 – 2023-02-21 (×3): 81 mg via ORAL
  Filled 2023-02-19 (×3): qty 1

## 2023-02-19 MED ORDER — ROSUVASTATIN CALCIUM 5 MG PO TABS
10.0000 mg | ORAL_TABLET | Freq: Every day | ORAL | Status: DC
  Administered 2023-02-19 – 2023-02-21 (×3): 10 mg via ORAL
  Filled 2023-02-19 (×3): qty 2

## 2023-02-19 MED ORDER — SODIUM CHLORIDE 0.9 % IV SOLN: INTRAVENOUS | Status: AC

## 2023-02-19 MED ORDER — ENOXAPARIN SODIUM 40 MG/0.4ML IJ SOSY
40.0000 mg | PREFILLED_SYRINGE | INTRAMUSCULAR | Status: DC
  Administered 2023-02-19 – 2023-02-21 (×3): 40 mg via SUBCUTANEOUS
  Filled 2023-02-19 (×3): qty 0.4

## 2023-02-19 MED ORDER — ACETAMINOPHEN 160 MG/5ML PO SOLN: 650.0000 mg | ORAL | Status: DC | PRN

## 2023-02-19 MED ORDER — SENNOSIDES-DOCUSATE SODIUM 8.6-50 MG PO TABS: 1.0000 | ORAL_TABLET | Freq: Every evening | ORAL | Status: DC | PRN

## 2023-02-19 MED ORDER — LABETALOL HCL 5 MG/ML IV SOLN: 10.0000 mg | INTRAVENOUS | Status: DC | PRN

## 2023-02-19 MED ORDER — CLOPIDOGREL BISULFATE 75 MG PO TABS: 75.0000 mg | ORAL_TABLET | Freq: Every day | ORAL | Status: DC

## 2023-02-19 MED ORDER — ENOXAPARIN SODIUM 60 MG/0.6ML IJ SOSY: 0.5000 mg/kg | PREFILLED_SYRINGE | INTRAMUSCULAR | Status: DC

## 2023-02-19 MED ORDER — ACETAMINOPHEN 325 MG PO TABS: 650.0000 mg | ORAL_TABLET | ORAL | Status: DC | PRN

## 2023-02-19 MED ORDER — STROKE: EARLY STAGES OF RECOVERY BOOK
Freq: Once | Status: AC
  Filled 2023-02-19: qty 1

## 2023-02-19 NOTE — Progress Notes (Signed)
   02/19/23 1430  Spiritual Encounters  Type of Visit Initial  Care provided to: Pt and family  Referral source Patient request  Reason for visit Urgent spiritual support  OnCall Visit No  Spiritual Framework  Presenting Themes Meaning/purpose/sources of inspiration;Values and beliefs;Significant life change;Coping tools;Impactful experiences and emotions;Courage hope and growth  Patient Stress Factors Health changes;Major life changes  Interventions  Spiritual Care Interventions Made Established relationship of care and support;Reflective listening;Normalization of emotions;Narrative/life review;Explored values/beliefs/practices/strengths;Prayer;Self-care teaching;Encouragement   Chap responded to Spiritual Consult request for chap to help PT work through emotions after significance health event. PT was eager to converse, as we his family who was present, Wife Micheal Ayers) and daughter Micheal Ayers).  Chap listened with compassion as PT described what happened to him and how it has impacted him emotionally. Micheal Ayers helped PT explore his faith while also encouraging self reflection in this time, challenging him to use both to grow after he goes home and is recovered from this.  PT showed great emotion throughout our time together showed encouragement as a result of our time together. Chaplain service remain available.  Chap encouraged PT to have nurse page Spiritual Care if he wants to talk again.

## 2023-02-19 NOTE — Hospital Course (Signed)
Patient is a 62 year old male with past medical history of ADD and obesity who presented to the emergency room on 3/24 with slurred speech and difficulty walking.  He states the difficulty walking issues have been going on for 2 weeks and the trouble talking have been going on for about a week.  He had been evaluated in the outpatient setting with negative workup for tickborne diseases, but also noted to have elevated blood pressure and started on blood pressure medication.  At that time, no brain imaging was done.  In the emergency room, patient's head CT noted for ventriculomegaly and MRI noted small acute to early subacute infarctions and marked ventriculomegaly.  Also noted to have a creatinine of 1.41.  Patient brought in for further evaluation.

## 2023-02-19 NOTE — Progress Notes (Signed)
[facility] Progress Note Patient: Micheal Ayers [id number] DOB: [birth year] DOA: 02/18/2023  DOS: the patient was seen and examined on 02/19/2023  Brief hospital course: PMH of ADD present to the hospital with complaints of slurred speech and difficulty with balance. 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  Neurology was consulted.  Currently concerned with regards to NPH and would recommend LP before discharge. Assessment and Plan: Multiple acute small stroke. Likely small vessel disease. CT head unremarkable. MRI brain shows small acute to early subacute small vessel infarcts. MRA without large vessel occlusion. Echocardiogram shows preserved EF no valvular abnormality. LDL 118. Hemoglobin A1c currently pending. Cleared swallowing evaluation.  Currently on regular diet. Aspirin 81 mg daily prior to admission. Aspirin and Plavix on discharge for 3 weeks recommended then Plavix alone. Plavix currently on hold due to consideration for LP.  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 Neurology recommend LP and even shunt placement. Currently awaiting further recommendation.  HTN. On Diovan at home. Blood pressure currently stable. Monitor.  HLD. LDL 118. Currently on no medication. Crestor 10 mg recommended.  Former smoker.  Obesity. Body mass index is 33.86 kg/m.  Placing the patient at high risk of poor outcome.  Obesity Body mass index is 33.86 kg/m.  Placing the pt at higher risk of poor outcomes.  Subjective: No acute complaint.  No nausea no vomiting no fever no chills.  Physical Exam: General: in Mild distress, No Rash Cardiovascular: S1 and S2 Present, No Murmur Respiratory: Good respiratory effort, Bilateral Air entry present. No Crackles, No wheezes Abdomen: Bowel Sound present, No tenderness Extremities: No edema Neuro: Alert and oriented x3, no new focal deficit  Data Reviewed: I  have Reviewed nursing notes, Vitals, and Lab results. Since last encounter, pertinent lab results CBC and BMP   . I have ordered test including CBC and BMP  . I have discussed pt's care plan and test results with neurology  .   Disposition: Status is: Inpatient Remains inpatient appropriate because: Need further workup for NPH.  enoxaparin (LOVENOX) injection 40 mg Start: 02/19/23 1815 SCD's Start: 02/19/23 0151   Family Communication: No one at bedside Level of care: Telemetry Medical   Vitals:   02/19/23 0411 02/19/23 0716 02/19/23 1104 02/19/23 1556  BP: (!) 154/108 (!) 150/93 (!) 134/92 132/84  Pulse: 80 84 85 87  Resp: 17 17 16 16   Temp: 97.9 F (36.6 C) 98.2 F (36.8 C) 98.3 F (36.8 C) 98.6 F (37 C)  TempSrc: Oral Oral Oral Oral  SpO2: 100% 99% 98% 99%  Weight: 107 kg     Height: 5\' 10"  (1.778 m)        Author: Berle Mull, MD 02/19/2023 7:42 PM  Please look on [email or web address] to find out who is on call.

## 2023-02-19 NOTE — Progress Notes (Signed)
Patient just arrived to the floor as a new admission. Admissions was notified.

## 2023-02-19 NOTE — Progress Notes (Addendum)
STROKE TEAM PROGRESS NOTE   INTERVAL HISTORY No family is at the bedside.   Patient presented with balance issues x 2 weeks without any focal weakness noted.  Wife also stated that he was slurring words over the past week.  MRI revealed several small strokes, along with hydrocephalus. Stroke workup is continuing.  However hydrocephalus is a major concern and we discussed the possibility of a lumbar puncture to relieve this pressure as well as a neurosurgical consult for VP shunt for future management .Discussed extensively with patient at bedside.  Vitals:   02/19/23 0411 02/19/23 0716 02/19/23 1104 02/19/23 1556  BP: (!) 154/108 (!) 150/93 (!) 134/92 132/84  Pulse: 80 84 85 87  Resp: 17 17 16 16   Temp: 97.9 F (36.6 C) 98.2 F (36.8 C) 98.3 F (36.8 C) 98.6 F (37 C)  TempSrc: Oral Oral Oral Oral  SpO2: 100% 99% 98% 99%  Weight: 107 kg     Height: 5\' 10"  (1.778 m)      CBC:  Recent Labs  Lab 02/18/23 1528  WBC 9.8  NEUTROABS 6.1  HGB 16.7  HCT 48.5  MCV 86.9  PLT 123456   Basic Metabolic Panel:  Recent Labs  Lab 02/18/23 1528 02/19/23 1230  NA 137 138  K 3.7 3.9  CL 102 104  CO2 28 25  GLUCOSE 144* 107*  BUN 29* 20  CREATININE 1.41* 1.00  CALCIUM 10.1 9.2   Lipid Panel:  Recent Labs  Lab 02/19/23 0605  CHOL 176  TRIG 107  HDL 37*  CHOLHDL 4.8  VLDL 21  LDLCALC 118*   HgbA1c: No results for input(s): "HGBA1C" in the last 168 hours. Urine Drug Screen: No results for input(s): "LABOPIA", "COCAINSCRNUR", "LABBENZ", "AMPHETMU", "THCU", "LABBARB" in the last 168 hours.  Alcohol Level No results for input(s): "ETH" in the last 168 hours.  IMAGING past 24 hours ECHOCARDIOGRAM COMPLETE  Result Date: 02/19/2023    ECHOCARDIOGRAM REPORT   Patient Name:   Micheal Ayers Date of Exam: 02/19/2023 Medical Rec #:  [id number]        Height:       70.0 in Accession #:    [id number]       Weight:       236.0 lb Date of Birth:  1961/10/25        BSA:          2.239 m  Patient Age:    62 years         BP:           134/92 mmHg Patient Gender: M                HR:           74 bpm. Exam Location:  Inpatient Procedure: 2D Echo, Cardiac Doppler and Color Doppler Indications:    Stroke  History:        Patient has no prior history of Echocardiogram examinations.                 Stroke, Signs/Symptoms:Fatigue; Risk Factors:Hypertension.  Sonographer:    Wenda Low Referring Phys: [id number] Lynd  1. Left ventricular ejection fraction, by estimation, is 60 to 65%. The left ventricle has normal function. The left ventricle has no regional wall motion abnormalities. Left ventricular diastolic parameters were normal.  2. Right ventricular systolic function is normal. The right ventricular size is normal. There is normal pulmonary artery systolic pressure.  3. The mitral  valve is normal in structure. No evidence of mitral valve regurgitation. No evidence of mitral stenosis.  4. The aortic valve is normal in structure. Aortic valve regurgitation is not visualized. No aortic stenosis is present.  5. The inferior vena cava is normal in size with greater than 50% respiratory variability, suggesting right atrial pressure of 3 mmHg. FINDINGS  Left Ventricle: Left ventricular ejection fraction, by estimation, is 60 to 65%. The left ventricle has normal function. The left ventricle has no regional wall motion abnormalities. The left ventricular internal cavity size was normal in size. There is  no left ventricular hypertrophy. Left ventricular diastolic parameters were normal. Right Ventricle: The right ventricular size is normal. No increase in right ventricular wall thickness. Right ventricular systolic function is normal. There is normal pulmonary artery systolic pressure. The tricuspid regurgitant velocity is 1.68 m/s, and  with an assumed right atrial pressure of 8 mmHg, the estimated right ventricular systolic pressure is 123456 mmHg. Left Atrium: Left atrial size was  normal in size. Right Atrium: Right atrial size was normal in size. Pericardium: There is no evidence of pericardial effusion. Mitral Valve: The mitral valve is normal in structure. No evidence of mitral valve regurgitation. No evidence of mitral valve stenosis. MV peak gradient, 4.6 mmHg. The mean mitral valve gradient is 1.0 mmHg. Tricuspid Valve: The tricuspid valve is normal in structure. Tricuspid valve regurgitation is not demonstrated. No evidence of tricuspid stenosis. Aortic Valve: The aortic valve is normal in structure. Aortic valve regurgitation is not visualized. No aortic stenosis is present. Aortic valve mean gradient measures 3.0 mmHg. Aortic valve peak gradient measures 5.7 mmHg. Aortic valve area, by VTI measures 2.91 cm. Pulmonic Valve: The pulmonic valve was normal in structure. Pulmonic valve regurgitation is trivial. No evidence of pulmonic stenosis. Aorta: The aortic root is normal in size and structure. Venous: The inferior vena cava is normal in size with greater than 50% respiratory variability, suggesting right atrial pressure of 3 mmHg. IAS/Shunts: No atrial level shunt detected by color flow Doppler.  LEFT VENTRICLE PLAX 2D LVIDd:         4.60 cm   Diastology LVIDs:         2.70 cm   LV e' medial:    4.79 cm/s LV PW:         1.50 cm   LV E/e' medial:  11.1 LV IVS:        1.30 cm   LV e' lateral:   9.68 cm/s LVOT diam:     2.10 cm   LV E/e' lateral: 5.5 LV SV:         75 LV SV Index:   33 LVOT Area:     3.46 cm  RIGHT VENTRICLE RV Basal diam:  3.80 cm RV Mid diam:    3.80 cm RV S prime:     13.30 cm/s TAPSE (M-mode): 2.6 cm LEFT ATRIUM             Index        RIGHT ATRIUM           Index LA diam:        3.80 cm 1.70 cm/m   RA Area:     19.90 cm LA Vol (A2C):   53.5 ml 23.89 ml/m  RA Volume:   58.50 ml  26.12 ml/m LA Vol (A4C):   60.0 ml 26.79 ml/m LA Biplane Vol: 58.1 ml 25.94 ml/m  AORTIC VALVE  PULMONIC VALVE AV Area (Vmax):    3.11 cm     PV Vmax:       0.67  m/s AV Area (Vmean):   3.02 cm     PV Peak grad:  1.8 mmHg AV Area (VTI):     2.91 cm AV Vmax:           119.00 cm/s AV Vmean:          75.400 cm/s AV VTI:            0.257 m AV Peak Grad:      5.7 mmHg AV Mean Grad:      3.0 mmHg LVOT Vmax:         107.00 cm/s LVOT Vmean:        65.800 cm/s LVOT VTI:          0.216 m LVOT/AV VTI ratio: 0.84  AORTA Ao Root diam: 4.00 cm Ao Asc diam:  3.50 cm MITRAL VALVE               TRICUSPID VALVE MV Area (PHT): 3.10 cm    TR Peak grad:   11.3 mmHg MV Area VTI:   3.98 cm    TR Vmax:        168.00 cm/s MV Peak grad:  4.6 mmHg MV Mean grad:  1.0 mmHg    SHUNTS MV Vmax:       1.07 m/s    Systemic VTI:  0.22 m MV Vmean:      45.8 cm/s   Systemic Diam: 2.10 cm MV Decel Time: 245 msec MV E velocity: 53.10 cm/s MV A velocity: 91.70 cm/s MV E/A ratio:  0.58 Aditya Sabharwal Electronically signed by Hebert Soho Signature Date/Time: 02/19/2023/3:32:52 PM    Final    MR ANGIO HEAD WO CONTRAST  Result Date: 02/19/2023 CLINICAL DATA:  Neuro deficit, acute, stroke suspected; Stroke/TIA, determine embolic source. EXAM: MRA HEAD WITHOUT CONTRAST MRA NECK WITHOUT CONTRAST TECHNIQUE: Multiplanar, multiecho pulse sequences of the brain and surrounding structures were obtained without intravenous contrast. Angiographic images of the Circle of Willis were obtained using MRA technique without intravenous contrast. Angiographic images of the neck were obtained using MRA technique without intravenous contrast. Carotid stenosis measurements (when applicable) are obtained utilizing NASCET criteria, using the distal internal carotid diameter as the denominator. COMPARISON:  None Available. FINDINGS: MRA HEAD FINDINGS The study is mildly motion degraded. The intracranial vertebral arteries are widely patent to the basilar. Patent PICA and SCA origins are seen bilaterally. The basilar artery is widely patent. The basilar artery is widely patent. Posterior communicating arteries are diminutive or  absent. Both PCAs are patent proximally without evidence of a flow limiting proximal stenosis. The internal carotid arteries are patent from skull base to carotid termini without evidence of a significant stenosis. 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 ACAs and MCAs are patent without evidence of a significant A1 or M1 stenosis. Detailed branch vessel evaluation is limited by motion. No aneurysm is identified. MRA NECK FINDINGS Assessment is limited by noncontrast technique and mild motion. The origins of the common carotid arteries are not well evaluated. The remainder of the common carotid arteries as well as the cervical internal carotid arteries are patent without evidence of a significant stenosis. The vertebral arteries are patent and codominant with antegrade flow bilaterally. Portions of the V1 and V3 segments are not well evaluated.  No flow limiting vertebral artery stenosis is evident elsewhere. IMPRESSION: 1. Motion degraded head MRA without evidence of a large vessel occlusion or flow limiting proximal stenosis. 2. Patent cervical carotid and vertebral arteries without evidence of a flow limiting stenosis within the above described study limitations. Electronically Signed   By: Logan Bores M.D.   On: 02/19/2023 11:33   MR ANGIO NECK WO CONTRAST  Result Date: 02/19/2023 CLINICAL DATA:  Neuro deficit, acute, stroke suspected; Stroke/TIA, determine embolic source. EXAM: MRA HEAD WITHOUT CONTRAST MRA NECK WITHOUT CONTRAST TECHNIQUE: Multiplanar, multiecho pulse sequences of the brain and surrounding structures were obtained without intravenous contrast. Angiographic images of the Circle of Willis were obtained using MRA technique without intravenous contrast. Angiographic images of the neck were obtained using MRA technique without intravenous contrast. Carotid stenosis measurements (when applicable) are  obtained utilizing NASCET criteria, using the distal internal carotid diameter as the denominator. COMPARISON:  None Available. FINDINGS: MRA HEAD FINDINGS The study is mildly motion degraded. The intracranial vertebral arteries are widely patent to the basilar. Patent PICA and SCA origins are seen bilaterally. The basilar artery is widely patent. The basilar artery is widely patent. Posterior communicating arteries are diminutive or absent. Both PCAs are patent proximally without evidence of a flow limiting proximal stenosis. The internal carotid arteries are patent from skull base to carotid termini without evidence of a significant stenosis. 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 ACAs and MCAs are patent without evidence of a significant A1 or M1 stenosis. Detailed branch vessel evaluation is limited by motion. No aneurysm is identified. MRA NECK FINDINGS Assessment is limited by noncontrast technique and mild motion. The origins of the common carotid arteries are not well evaluated. The remainder of the common carotid arteries as well as the cervical internal carotid arteries are patent without evidence of a significant stenosis. The vertebral arteries are patent and codominant with antegrade flow bilaterally. Portions of the V1 and V3 segments are not well evaluated. No flow limiting vertebral artery stenosis is evident elsewhere. IMPRESSION: 1. Motion degraded head MRA without evidence of a large vessel occlusion or flow limiting proximal stenosis. 2. Patent cervical carotid and vertebral arteries without evidence of a flow limiting stenosis within the above described study limitations. Electronically Signed   By: Logan Bores M.D.   On: 02/19/2023 11:33   MR BRAIN WO CONTRAST  Result Date: 02/18/2023 CLINICAL DATA:  Initial evaluation for neuro deficit, stroke suspected. EXAM: MRI HEAD WITHOUT CONTRAST  TECHNIQUE: Multiplanar, multiecho pulse sequences of the brain and surrounding structures were obtained without intravenous contrast. COMPARISON:  Prior CT from earlier the same day. FINDINGS: Brain: Cerebral volume within normal limits. Scattered patchy T2/FLAIR hyperintensity involving the periventricular deep white matter both cerebral hemispheres, most consistent with chronic small vessel ischemic disease, mild for age. Small remote left cerebellar infarct. 7 mm focus of diffusion signal abnormality seen involving the subcortical white matter of the anterior right frontal corona radiata (series 2, image 44). In additional 8 mm focus of diffusion signal abnormality seen involving the right thalamus (series 2, image 31). Subtle associated signal loss seen at these locations on corresponding ADC map (series 250, images 44, 31 respectively. Findings consistent with small acute to early subacute small vessel infarcts. No associated hemorrhage or mass effect. No other evidence for acute or subacute ischemia. No areas of chronic cortical infarction. No acute or chronic intracranial  blood products. No mass lesion, mass effect or, or midline shift. Marked lateral and third ventriculomegaly, with comparatively normal size of the fourth ventricle. Changes are somewhat out of portion to overlying cortical sulcation. No findings to suggest transependymal flow of CSF. No extra-axial collection. Pituitary gland and suprasellar region within normal limits. Vascular: Major intracranial vascular flow voids are maintained. Skull and upper cervical spine: Craniocervical junction within normal limits. Bone marrow signal intensity normal. No scalp soft tissue abnormality. Sinuses/Orbits: Globes orbital soft tissues demonstrate no acute finding. Tiny subcentimeter T2 hypointense focus noted along the posteromedial aspect of the left globe on T2 weighted sequence (series 5, image 13), not well seen on corresponding sequences, and  favored to be related to an underlying vessel. Mild mucosal thickening noted about the left maxillary sinus. Paranasal sinuses are otherwise clear. Trace left mastoid effusion, of doubtful significance. Other: None. IMPRESSION: 1. Small acute to early subacute small vessel infarcts involving the subcortical white matter of the anterior right frontal corona radiata and right thalamus. No associated hemorrhage or mass effect. 2. Marked lateral and third ventriculomegaly, somewhat out of proportion to overlying cortical sulcation. Clinical correlation for possible in pH is advised. Possible underlying cerebral aqueductal stenosis would be the primary differential consideration. No significant transependymal flow of CSF, suggesting at this is a chronic and compensated finding. 3. Underlying mild chronic microvascular ischemic disease. Small remote left cerebellar infarct. Electronically Signed   By: Jeannine Boga M.D.   On: 02/18/2023 19:49    PHYSICAL EXAM  Temp:  [97.8 F (36.6 C)-98.6 F (37 C)] 98.6 F (37 C) (03/25 1556) Pulse Rate:  [65-102] 87 (03/25 1556) Resp:  [16-22] 16 (03/25 1556) BP: (132-173)/(84-117) 132/84 (03/25 1556) SpO2:  [96 %-100 %] 99 % (03/25 1556) Weight:  OH:[id number] kg] 107 kg (03/25 0411)  Physical Exam  Appears well-developed and well-nourished.  Middle-aged Caucasian male   Neuro: Mental Status: Patient is awake, alert, oriented to person, place, month, year, and situation. Patient is able to give a clear and coherent history. No signs of aphasia or neglect Cranial Nerves: II: Visual Fields are full. Pupils are equal, round, and reactive to light.   III,IV, VI: EOMI without ptosis or diploplia.  V: Facial sensation is symmetric to temperature VII: Facial movement is symmetric.  VIII: hearing is intact to voice X: Uvula elevates symmetrically XI: Shoulder shrug is symmetric. XII: tongue is midline without atrophy or fasciculations.  Motor: Tone is normal.  Bulk is normal. 5/5 strength was present in all four extremities.  Sensory: Sensation is symmetric to light touch and temperature in the arms and legs. Cerebellar: FNF intact bilaterally   ASSESSMENT/PLAN Micheal Ayers is a 62 y.o. male with history of ADD, low testosterone presenting with balance issues x 2 weeks without any focal weakness noted.  Wife also stated that he was slurring words over the past week.  MRI revealed several small strokes, along with hydrocephalus. Symptoms have improved.   Multiple small acute strokes, likely due to concurrent small vessel disease.  Concurrent with normal pressure hydrocephalus  CT head:  No acute intracranial process. Ventriculomegaly, favored to represent ex vacuo dilatation in the setting of diffuse cerebral volume loss, which is most prominent in the frontal and parietal lobes, with less significant atrophy in the temporal and right occipital lobe. MRI  Small acute to early subacute small vessel infarcts involving the subcortical white matter of the anterior right frontal corona radiata and right thalamus. No associated hemorrhage  or mass effect. Marked lateral and third ventriculomegaly, somewhat out of proportion to overlying cortical sulcation. Clinical correlation for possible in pH is advised. Possible underlying cerebral aqueductal stenosis would be the primary differential consideration. No significant transependymal flow of CSF, suggesting at this is a chronic and compensated finding. Underlying mild chronic microvascular ischemic disease. Small remote left cerebellar infarct MRA:  Motion degraded head MRA without evidence of a large vessel occlusion or flow limiting proximal stenosis. Patent cervical carotid and vertebral arteries without evidence of a flow limiting stenosis within the above described study limitations  2D Echo: EF 60 to 65%, no LVH, normal pulmonary artery systolic pressure no MVR, no TVR, no AVR trivial  pulmonic valve regurgitation.  LDL 118 HgbA1c No results found for requested labs within last 1095 days. VTE prophylaxis - lovenox    Diet   Diet regular Room service appropriate? No; Fluid consistency: Thin   aspirin 81 mg daily prior to admission, now on aspirin. Recommend aspirin and Plavix on discharge x 3 weeks then Plavix alone. PLAVIX on HOLD due to patient considering inpatient LP procedure Therapy recommendations:  Home with assistance Disposition:  home (lives with wife)  Normal-Pressure Hydrocephalus MRI  Marked lateral and third ventriculomegaly, somewhat out of proportion to overlying cortical sulcation. Clinical correlation for possible in pH is advised. Possible underlying cerebral aqueductal stenosis would be the primary differential consideration. No significant transependymal flow of CSF, suggesting at this is a chronic and compensated finding. Discussed LP procedure and Shunt placement with patient at bedside, pending decision on LP inpatient.  Monitor for increased NPH symptoms (balance issues, mood changes, forgetfulness/confusion) SAT CT with any acute worsening in mental status  Hypertension Home meds:  Diovan Permissive hypertension (OK if < 220/120) but gradually normalize in 5-7 days Long-term BP goal normotensive  Hyperlipidemia Home meds:  none LDL 118, goal < 70 Add Crestor 10mg   Continue statin at discharge  Other Stroke Risk Factors Former Cigarette smoker Obesity, Body mass index is 33.86 kg/m., BMI >/= 30 associated with increased stroke risk, recommend weight loss, diet and exercise as appropriate  Family hx stroke (granparent)  Other Active Problems 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 Note/plan to be edited by MD as needed.    Otelia Santee, DNP, AGACNP-BC [facility] Please use AMION for pager and EPIC for messaging  STROKE MD NOTE :  I have personally obtained  history,examined this patient, reviewed notes, independently viewed imaging studies, participated in medical decision making and plan of care.ROS completed by me personally and pertinent positives fully documented  I have made any additions or clarifications directly to the above note. Agree with note above.  Patient presented with 2-week history of slurred speech and worsening gait and balance issues as well as some bladder urgency.  Denies any new memory loss though he has some pre-existing short-term memory issues.  MRI scan shows tiny right frontal white matter and thalamic lacunar infarcts from small vessel disease.  CT scan and MRI shows significant ventriculomegaly without corresponding cortical atrophy raising concern for normal pressure hydrocephalus.  Long discussion with patient about need to do spinal tap to look for objective improvement in his gait and balance prior to and after the spinal tap and if helpful consider referral to neurosurgery for VP shunt.  Patient denies any prior history of remote head trauma, subarachnoid hemorrhage, meningitis.  Dual antiplatelet therapy but  hold Plavix till patient makes decision on spinal tap,.  Aggressive risk factor modification.  Discussed with Dr. Posey Pronto.  Greater than 50% time during this 50-minute visit was spent in counseling and coordination of care about his lacunar stroke and possible normal pressure hydrocephalus and discussion about evaluation and treatment plan and answering questions.  Antony Contras, MD Medical Director [facility] Stroke [facility] Pager: [phone] 02/19/2023 5:17 PM  To contact Stroke Continuity provider, please refer to [email or web address] After hours, contact General Neurology

## 2023-02-19 NOTE — Evaluation (Signed)
Clinical/Bedside Swallow Evaluation Patient Details  Name: Micheal Ayers MRN: [id number] Date of Birth: Nov 14, 1961  Today's Date: 02/19/2023 Time: SLP Start Time (ACUTE ONLY): 1117 SLP Stop Time (ACUTE ONLY): 1128 SLP Time Calculation (min) (ACUTE ONLY): 11 min  Past Medical History:  Past Medical History:  Diagnosis Date   ADD (attention deficit disorder)    Allergic rhinitis, cause unspecified    Past Surgical History:  Past Surgical History:  Procedure Laterality Date   elbow surgury  1969   left elbow   INGUINAL HERNIA REPAIR  2003   left   HPI:  Pt is a 62 y.o. M who presented 02/18/23 with slurred speech and balance difficulty. MRI brian 3/24: Small acute to early subacute small vessel infarcts involving the subcortical white matter of the anterior right frontal corona radiata and right thalamus. No significant PMH.    Assessment / Plan / Recommendation  Clinical Impression  Pt was seen for bedside swallow evaluation. He reported that he has been coughing intermittently with thin liquids for the past six weeks and questioned whether it aligns with when he drinks quickly. He exhibited a delayed cough once with consecutive swallows of thin liquids via straw, but tolerated all other boluses of liquids and solids without symptoms of oropharyngeal dysphagia. A regular texture diet with thin liquids is recommended at this time. SLP will follow to ensure tolerance to determine whether there are further needs. SLP Visit Diagnosis: Dysphagia, unspecified (R13.10)    Aspiration Risk  Mild aspiration risk    Diet Recommendation Regular;Thin liquid   Liquid Administration via: Cup;Straw Medication Administration: Whole meds with puree (or with liquid as tolerated) Supervision: Patient able to self feed;Intermittent supervision to cue for compensatory strategies Compensations: Slow rate;Small sips/bites Postural Changes: Seated upright at 90 degrees    Other  Recommendations Oral  Care Recommendations: Oral care BID    Recommendations for follow up therapy are one component of a multi-disciplinary discharge planning process, led by the attending physician.  Recommendations may be updated based on patient status, additional functional criteria and insurance authorization.  Follow up Recommendations Outpatient SLP      Assistance Recommended at Discharge    Functional Status Assessment Patient has had a recent decline in their functional status and demonstrates the ability to make significant improvements in function in a reasonable and predictable amount of time.  Frequency and Duration min 2x/week  2 weeks       Prognosis Prognosis for improved oropharyngeal function: Good      Swallow Study   General Date of Onset: 01/08/23 HPI: Pt is a 62 y.o. M who presented 02/18/23 with slurred speech and balance difficulty. MRI brian 3/24: Small acute to early subacute small vessel infarcts involving the subcortical white matter of the anterior right frontal corona radiata and right thalamus. No significant PMH. Type of Study: Bedside Swallow Evaluation Previous Swallow Assessment: none Diet Prior to this Study: NPO Temperature Spikes Noted: No Respiratory Status: Room air History of Recent Intubation: No Behavior/Cognition: Alert;Cooperative;Pleasant mood Oral Cavity Assessment: Within Functional Limits Oral Care Completed by SLP: No Oral Cavity - Dentition: Adequate natural dentition Vision: Functional for self-feeding Self-Feeding Abilities: Able to feed self Patient Positioning: Upright in bed;Postural control adequate for testing Baseline Vocal Quality: Normal Volitional Cough: Strong Volitional Swallow: Able to elicit    Oral/Motor/Sensory Function Overall Oral Motor/Sensory Function: Mild impairment   Ice Chips Ice chips: Within functional limits Presentation: Spoon   Thin Liquid Thin Liquid: Impaired Presentation: Cup;Straw  Pharyngeal  Phase  Impairments: Cough - Delayed    Nectar Thick Nectar Thick Liquid: Not tested   Honey Thick Honey Thick Liquid: Not tested   Puree Puree: Within functional limits Presentation: Spoon   Solid     Solid: Within functional limits Presentation: [place] [address], [place], [place] Office number [phone]  Horton Marshall 02/19/2023,12:42 PM

## 2023-02-19 NOTE — TOC Initial Note (Signed)
Transition of Care [facility] - Initial/Assessment Note    Patient Details  Name: Micheal Ayers MRN: [id number] Date of Birth: [birth year]  Transition of Care [facility]) CM/SW Contact:    Pollie Friar, RN Phone Number: 02/19/2023, 4:27 PM  Clinical Narrative:                 Pt is from home with his spouse and daughter. He usually has someone with him most of the time.  No DME at home.  Pt was driving self prior to the hospital but wife can provide transportation as needed. Pt manages his own medications and denies any issues. Outpatient therapy recommended. Pt prefers to attend at [facility]. Referral sent to [facility] outpatient rehab. Information on the AVS.l Wife will transport home when medically ready.  Expected Discharge Plan: OP Rehab Barriers to Discharge: Continued Medical Work up   Patient Goals and CMS Choice   CMS Medicare.gov Compare Post Acute Care list provided to:: Patient Choice offered to / list presented to : Patient, Spouse      Expected Discharge Plan and Services   Discharge Planning Services: CM Consult   Living arrangements for the past 2 months: Single Family Home                                      Prior Living Arrangements/Services Living arrangements for the past 2 months: Single Family Home Lives with:: Spouse Patient language and need for interpreter reviewed:: Yes Do you feel safe going back to the place where you live?: Yes        Care giver support system in place?: Yes (comment)   Criminal Activity/Legal Involvement Pertinent to Current Situation/Hospitalization: No - Comment as needed  Activities of Daily Living Home Assistive Devices/Equipment: None ADL Screening (condition at time of admission) Patient's cognitive ability adequate to safely complete daily activities?: Yes Is the patient deaf or have difficulty hearing?: No Does the patient have difficulty seeing, even when wearing glasses/contacts?: No Does the patient  have difficulty concentrating, remembering, or making decisions?: No Patient able to express need for assistance with ADLs?: No Does the patient have difficulty dressing or bathing?: No Independently performs ADLs?: Yes (appropriate for developmental age) Does the patient have difficulty walking or climbing stairs?: No Weakness of Legs: None Weakness of Arms/Hands: Left  Permission Sought/Granted                  Emotional Assessment Appearance:: Appears stated age Attitude/Demeanor/Rapport: Engaged Affect (typically observed): Accepting Orientation: : Oriented to Self, Oriented to Place, Oriented to  Time, Oriented to Situation   Psych Involvement: No (comment)  Admission diagnosis:  Disequilibrium [R42] Cerebral ventriculomegaly [G93.89] Acute CVA (cerebrovascular accident) ([place]) [I63.9] Opacity of lung on imaging study [R91.8] Ischemic cerebrovascular accident (CVA) [facility]) [I63.9] Patient Active Problem List   Diagnosis Date Noted   Ischemic cerebrovascular accident (CVA) ([place]) 02/19/2023   Cerebral ventriculomegaly 02/19/2023   Acute renal failure superimposed on stage 2 chronic kidney disease ([place]) 02/19/2023   Hypertensive urgency 02/19/2023   Abnormal CXR 02/19/2023   Fatigue 09/20/2012   Family history of hemochromatosis 09/20/2012   Preventative health care 09/06/2012   ADD (attention deficit disorder)    Allergic rhinitis, cause unspecified    PCP:  Jonathon Jordan, MD Pharmacy:   [facility], [place]  [place] [address] Phone: [phone] Fax: [phone]     Social Determinants of Health (SDOH) Social History: [place]: No Food Insecurity (02/19/2023)  Housing: Low Risk  (02/19/2023)  Transportation Needs: No Transportation Needs (02/19/2023)  Utilities: Not At Risk (02/19/2023)  Tobacco Use: Medium Risk (02/19/2023)   SDOH Interventions:      Readmission Risk Interventions     No data to display

## 2023-02-19 NOTE — Plan of Care (Signed)
  Problem: Education: Goal: Knowledge of disease or condition will improve Outcome: Not Progressing Goal: Knowledge of secondary prevention will improve (MUST DOCUMENT ALL) Outcome: Not Progressing Goal: Knowledge of patient specific risk factors will improve (Mark N/A or DELETE if not current risk factor) Outcome: Not Progressing   Problem: Ischemic Stroke/TIA Tissue Perfusion: Goal: Complications of ischemic stroke/TIA will be minimized Outcome: Not Progressing   Problem: Coping: Goal: Will verbalize positive feelings about self Outcome: Not Progressing Goal: Will identify appropriate support needs Outcome: Not Progressing   Problem: Health Behavior/Discharge Planning: Goal: Ability to manage health-related needs will improve Outcome: Not Progressing Goal: Goals will be collaboratively established with patient/family Outcome: Not Progressing   Problem: Self-Care: Goal: Ability to participate in self-care as condition permits will improve Outcome: Not Progressing Goal: Verbalization of feelings and concerns over difficulty with self-care will improve Outcome: Not Progressing Goal: Ability to communicate needs accurately will improve Outcome: Not Progressing   Problem: Nutrition: Goal: Risk of aspiration will decrease Outcome: Not Progressing Goal: Dietary intake will improve Outcome: Not Progressing   Problem: Education: Goal: Knowledge of General Education information will improve Description: Including pain rating scale, medication(s)/side effects and non-pharmacologic comfort measures Outcome: Not Progressing   Problem: Health Behavior/Discharge Planning: Goal: Ability to manage health-related needs will improve Outcome: Not Progressing   Problem: Clinical Measurements: Goal: Ability to maintain clinical measurements within normal limits will improve Outcome: Not Progressing Goal: Will remain free from infection Outcome: Not Progressing Goal: Diagnostic test  results will improve Outcome: Not Progressing Goal: Respiratory complications will improve Outcome: Not Progressing Goal: Cardiovascular complication will be avoided Outcome: Not Progressing   Problem: Activity: Goal: Risk for activity intolerance will decrease Outcome: Not Progressing   Problem: Nutrition: Goal: Adequate nutrition will be maintained Outcome: Not Progressing   Problem: Coping: Goal: Level of anxiety will decrease Outcome: Not Progressing   Problem: Elimination: Goal: Will not experience complications related to bowel motility Outcome: Not Progressing Goal: Will not experience complications related to urinary retention Outcome: Not Progressing   Problem: Pain Managment: Goal: General experience of comfort will improve Outcome: Not Progressing   Problem: Safety: Goal: Ability to remain free from injury will improve Outcome: Not Progressing   Problem: Skin Integrity: Goal: Risk for impaired skin integrity will decrease Outcome: Not Progressing   

## 2023-02-19 NOTE — Progress Notes (Signed)
OT Cancellation Note  Patient Details Name: Micheal Ayers MRN: [id number] DOB: [birth year]   Cancelled Treatment:    Reason Eval/Treat Not Completed: OT screened, no needs identified, will sign off Discussed with PT. Pt moving well and able to manage ADLs without assistance. No formal OT eval needed at this time.  Layla Maw 02/19/2023, 11:26 AM

## 2023-02-19 NOTE — Plan of Care (Signed)

## 2023-02-19 NOTE — Progress Notes (Signed)
Received information about this patient from [place], [place]. Awaiting for this patient to be transferred to our unit.

## 2023-02-19 NOTE — Progress Notes (Addendum)
ANTICOAGULATION CONSULT NOTE - Initial Consult  Pharmacy Consult for lovenox Indication: VTE prophylaxis  Allergies  Allergen Reactions   Erythromycin Hives    Patient Measurements: Height: 5\' 10"  (177.8 cm) Weight: 107 kg (236 lb) IBW/kg (Calculated) : 73  Vital Signs: Temp: 98.6 F (37 C) (03/25 1556) Temp Source: Oral (03/25 1556) BP: 132/84 (03/25 1556) Pulse Rate: 87 (03/25 1556)  Labs: Recent Labs    02/18/23 1528 02/19/23 0605 02/19/23 1027 02/19/23 1230  HGB 16.7  --   --   --   HCT 48.5  --   --   --   PLT 255  --   --   --   CREATININE 1.41*  --   --  1.00  TROPONINIHS 4 5 5   --     Estimated Creatinine Clearance: 96.2 mL/min (by C-G formula based on SCr of 1 mg/dL).   Medical History: Past Medical History:  Diagnosis Date   ADD (attention deficit disorder)    Allergic rhinitis, cause unspecified     Medications:  Medications Prior to Admission  Medication Sig Dispense Refill Last Dose   atomoxetine (STRATTERA) 60 MG capsule Take 1 capsule (60 mg total) by mouth daily. 30 capsule 5 02/18/2023   Cyanocobalamin (VITAMIN B12 PO) Take 1 tablet by mouth daily.   02/18/2023   meloxicam (MOBIC) 15 MG tablet Take 15 mg by mouth daily as needed for pain.   02/18/2023   valsartan-hydrochlorothiazide (DIOVAN-HCT) 80-12.5 MG tablet Take 1 tablet by mouth daily.   02/18/2023   VITAMIN D PO Take 1 tablet by mouth daily.   02/18/2023   Scheduled:   [START ON 02/20/2023]  stroke: early stages of recovery book   Does not apply Once   aspirin EC  81 mg Oral Daily   enoxaparin (LOVENOX) injection  0.5 mg/kg Subcutaneous Q24H   rosuvastatin  10 mg Oral Daily    Assessment: 25 YOM requiring VTE ppx with LMWH s/p CVA MR brain results reviewed. No bleed noted.  Goal of Therapy:  Monitor platelets by anticoagulation protocol: Yes   Plan:  Lovenox 40 mg [place] qday Monitor for signs of bleeding Pharmacy will sign off consult but continue to make recommendations  prn Thank you   Vaughan Basta BS, PharmD, BCPS Clinical Pharmacist 02/19/2023 5:12 PM  Contact: [phone] after 3 PM  "Be curious, not judgmental..." -Jamal Maes

## 2023-02-19 NOTE — H&P (Signed)
History and Physical    Micheal Ayers [id number] DOB: 05-08-1961 DOA: 02/18/2023  PCP: Jonathon Jordan, MD   Patient coming from: Home   Chief Complaint: Difficulty with balance, slurred speech   HPI: Micheal Ayers is a 62 y.o. male with medical history significant for ADD who presents to the emergency department with slurred speech and difficulty with balance.  Patient reports difficulty with balance while walking for roughly 2 weeks now.  He describes bumping into walls while walking.  He believes he has been falling towards the left and the right at times.  His wife noted dysarthria for the past week or so.  Patient was evaluated in the outpatient setting where he reports the workup which included testing for RMSF and Lyme was negative but he was noted to be hypertensive and started blood pressure medication 02/15/2023.  He did not have brain imaging as part of the outpatient workup.  He denies confusion or incontinence.  ED Course: Upon arrival to the ED, patient is found to be afebrile and saturating well on room air with elevated blood pressure.  EKG demonstrates sinus rhythm.  Chest x-ray notable for lingular opacity.  Head CT notable for ventriculomegaly.  MRI brain concerning for small acute to early subacute infarctions and marked ventriculomegaly.  Labs most notable for creatinine 1.41.  Neurology was consulted by the ED physician.  Review of Systems:  All other systems reviewed and apart from HPI, are negative.  Past Medical History:  Diagnosis Date   ADD (attention deficit disorder)    Allergic rhinitis, cause unspecified     Past Surgical History:  Procedure Laterality Date   elbow surgury  1969   left elbow   INGUINAL HERNIA REPAIR  2003   left    Social History:   reports that he has quit smoking. He has never used smokeless tobacco. He reports current alcohol use. He reports that he does not use drugs.  Allergies  Allergen Reactions   Erythromycin      Other reaction(s): hives    Family History  Problem Relation Age of Onset   Hypertension Other    Alcohol abuse Other    Hypertension Other    Kidney disease Other    Cancer Other    Alcohol abuse Other    Arthritis Other    Stroke Other    Hypertension Other    Cancer Other      Prior to Admission medications   Medication Sig Start Date End Date Taking? Authorizing Provider  amoxicillin-clavulanate (AUGMENTIN) 875-125 MG tablet Take 1 tablet by mouth every 12 (twelve) hours for 7 days. 02/18/23 02/25/23 Yes Gareth Morgan, MD  doxycycline (VIBRAMYCIN) 100 MG capsule Take 1 capsule (100 mg total) by mouth 2 (two) times daily for 7 days. 02/18/23 02/25/23 Yes Gareth Morgan, MD  aspirin 81 MG EC tablet Take 1 tablet (81 mg total) by mouth daily. Swallow whole. 09/20/12   Biagio Borg, MD  atomoxetine (STRATTERA) 60 MG capsule Take 1 capsule (60 mg total) by mouth daily. 01/04/23   Mozingo, Berdie Ogren, NP    Physical Exam: Vitals:   02/18/23 1759 02/18/23 2129 02/19/23 0059 02/19/23 0411  BP: (!) 173/117 (!) 164/110 (!) 149/113 (!) 154/108  Pulse: 100 (!) 102 65 80  Resp:  (!) 22 16 17   Temp: 97.8 F (36.6 C) 98.3 F (36.8 C) 98 F (36.7 C) 97.9 F (36.6 C)  TempSrc: Oral  Oral Oral  SpO2: 97% 96% 97% 100%  Weight:    107 kg  Height:    5\' 10"  (1.778 m)     Constitutional: NAD, no pallor or diaphoresis   Eyes: PERTLA, lids and conjunctivae normal ENMT: Mucous membranes are moist. Posterior pharynx clear of any exudate or lesions.   Neck: supple, no masses  Respiratory: no wheezing, no crackles. No accessory muscle use.  Cardiovascular: S1 & S2 heard, regular rate and rhythm. No extremity edema.   Abdomen: No distension, no tenderness, soft. Bowel sounds active.  Musculoskeletal: no clubbing / cyanosis. No joint deformity upper and lower extremities.   Skin: no significant rashes, lesions, ulcers. Warm, dry, well-perfused. Neurologic: CN 2-12 grossly intact.  Sensation intact. Strength 5/5 in all 4 limbs. Alert and oriented.  Psychiatric: Calm. Cooperative.    Labs and Imaging on Admission: I have personally reviewed following labs and imaging studies  CBC: Recent Labs  Lab 02/18/23 1528  WBC 9.8  NEUTROABS 6.1  HGB 16.7  HCT 48.5  MCV 86.9  PLT 123456   Basic Metabolic Panel: Recent Labs  Lab 02/18/23 1528  NA 137  K 3.7  CL 102  CO2 28  GLUCOSE 144*  BUN 29*  CREATININE 1.41*  CALCIUM 10.1   GFR: Estimated Creatinine Clearance: 68.2 mL/min (A) (by C-G formula based on SCr of 1.41 mg/dL (H)). Liver Function Tests: Recent Labs  Lab 02/18/23 1528  AST 14*  ALT 16  ALKPHOS 65  BILITOT 0.6  PROT 7.4  ALBUMIN 4.4   No results for input(s): "LIPASE", "AMYLASE" in the last 168 hours. No results for input(s): "AMMONIA" in the last 168 hours. Coagulation Profile: No results for input(s): "INR", "PROTIME" in the last 168 hours. Cardiac Enzymes: No results for input(s): "CKTOTAL", "CKMB", "CKMBINDEX", "TROPONINI" in the last 168 hours. BNP (last 3 results) No results for input(s): "PROBNP" in the last 8760 hours. HbA1C: No results for input(s): "HGBA1C" in the last 72 hours. CBG: No results for input(s): "GLUCAP" in the last 168 hours. Lipid Profile: No results for input(s): "CHOL", "HDL", "LDLCALC", "TRIG", "CHOLHDL", "LDLDIRECT" in the last 72 hours. Thyroid Function Tests: Recent Labs    02/18/23 1528  TSH 0.795  FREET4 1.01   Anemia Panel: No results for input(s): "VITAMINB12", "FOLATE", "FERRITIN", "TIBC", "IRON", "RETICCTPCT" in the last 72 hours. Urine analysis:    Component Value Date/Time   COLORURINE LT. YELLOW 09/20/2012 1054   APPEARANCEUR CLEAR 09/20/2012 1054   LABSPEC 1.020 09/20/2012 1054   PHURINE 6.5 09/20/2012 1054   GLUCOSEU NEGATIVE 09/20/2012 1054   HGBUR NEGATIVE 09/20/2012 1054   BILIRUBINUR NEGATIVE 09/20/2012 1054   KETONESUR NEGATIVE 09/20/2012 1054   UROBILINOGEN 2.0 09/20/2012  1054   NITRITE NEGATIVE 09/20/2012 1054   LEUKOCYTESUR NEGATIVE 09/20/2012 1054   Sepsis Labs: @LABRCNTIP (procalcitonin:4,lacticidven:4) ) Recent Results (from the past 240 hour(s))  Resp panel by RT-PCR (RSV, Flu A&B, Covid) Anterior Nasal Swab     Status: None   Collection Time: 02/18/23  3:05 PM   Specimen: Anterior Nasal Swab  Result Value Ref Range Status   SARS Coronavirus 2 by RT PCR NEGATIVE NEGATIVE Final    Comment: (NOTE) SARS-CoV-2 target nucleic acids are NOT DETECTED.  The SARS-CoV-2 RNA is generally detectable in upper respiratory specimens during the acute phase of infection. The lowest concentration of SARS-CoV-2 viral copies this assay can detect is 138 copies/mL. A negative result does not preclude SARS-Cov-2 infection and should not be used as the sole basis for treatment or other patient management decisions. A  negative result may occur with  improper specimen collection/handling, submission of specimen other than nasopharyngeal swab, presence of viral mutation(s) within the areas targeted by this assay, and inadequate number of viral copies(<138 copies/mL). A negative result must be combined with clinical observations, patient history, and epidemiological information. The expected result is Negative.  Fact Sheet for Patients:  EntrepreneurPulse.com.au  Fact Sheet for Healthcare Providers:  IncredibleEmployment.be  This test is no t yet approved or cleared by the [place] FDA and  has been authorized for detection and/or diagnosis of SARS-CoV-2 by FDA under an Emergency Use Authorization (EUA). This EUA will remain  in effect (meaning this test can be used) for the duration of the COVID-19 declaration under Section 564(b)(1) of the Act, 21 U.S.C.section 360bbb-3(b)(1), unless the authorization is terminated  or revoked sooner.       Influenza A by PCR NEGATIVE NEGATIVE Final   Influenza B by PCR NEGATIVE  NEGATIVE Final    Comment: (NOTE) The Xpert Xpress SARS-CoV-2/FLU/RSV plus assay is intended as an aid in the diagnosis of influenza from Nasopharyngeal swab specimens and should not be used as a sole basis for treatment. Nasal washings and aspirates are unacceptable for Xpert Xpress SARS-CoV-2/FLU/RSV testing.  Fact Sheet for Patients: EntrepreneurPulse.com.au  Fact Sheet for Healthcare Providers: IncredibleEmployment.be  This test is not yet approved or cleared by the [place] FDA and has been authorized for detection and/or diagnosis of SARS-CoV-2 by FDA under an Emergency Use Authorization (EUA). This EUA will remain in effect (meaning this test can be used) for the duration of the COVID-19 declaration under Section 564(b)(1) of the Act, 21 U.S.C. section 360bbb-3(b)(1), unless the authorization is terminated or revoked.     Resp Syncytial Virus by PCR NEGATIVE NEGATIVE Final    Comment: (NOTE) Fact Sheet for Patients: EntrepreneurPulse.com.au  Fact Sheet for Healthcare Providers: IncredibleEmployment.be  This test is not yet approved or cleared by the [place] FDA and has been authorized for detection and/or diagnosis of SARS-CoV-2 by FDA under an Emergency Use Authorization (EUA). This EUA will remain in effect (meaning this test can be used) for the duration of the COVID-19 declaration under Section 564(b)(1) of the Act, 21 U.S.C. section 360bbb-3(b)(1), unless the authorization is terminated or revoked.  Performed at [facility], [address] [place], [place] [address]      Radiological Exams on Admission: MR BRAIN WO CONTRAST  Result Date: 02/18/2023 CLINICAL DATA:  Initial evaluation for neuro deficit, stroke suspected. EXAM: MRI HEAD WITHOUT CONTRAST TECHNIQUE: Multiplanar, multiecho pulse sequences of the brain and surrounding structures were obtained  without intravenous contrast. COMPARISON:  Prior CT from earlier the same day. FINDINGS: Brain: Cerebral volume within normal limits. Scattered patchy T2/FLAIR hyperintensity involving the periventricular deep white matter both cerebral hemispheres, most consistent with chronic small vessel ischemic disease, mild for age. Small remote left cerebellar infarct. 7 mm focus of diffusion signal abnormality seen involving the subcortical white matter of the anterior right frontal corona radiata (series 2, image 44). In additional 8 mm focus of diffusion signal abnormality seen involving the right thalamus (series 2, image 31). Subtle associated signal loss seen at these locations on corresponding ADC map (series 250, images 44, 31 respectively. Findings consistent with small acute to early subacute small vessel infarcts. No associated hemorrhage or mass effect. No other evidence for acute or subacute ischemia. No areas of chronic cortical infarction. No acute or chronic intracranial blood products. No mass lesion, mass effect or, or  midline shift. Marked lateral and third ventriculomegaly, with comparatively normal size of the fourth ventricle. Changes are somewhat out of portion to overlying cortical sulcation. No findings to suggest transependymal flow of CSF. No extra-axial collection. Pituitary gland and suprasellar region within normal limits. Vascular: Major intracranial vascular flow voids are maintained. Skull and upper cervical spine: Craniocervical junction within normal limits. Bone marrow signal intensity normal. No scalp soft tissue abnormality. Sinuses/Orbits: Globes orbital soft tissues demonstrate no acute finding. Tiny subcentimeter T2 hypointense focus noted along the posteromedial aspect of the left globe on T2 weighted sequence (series 5, image 13), not well seen on corresponding sequences, and favored to be related to an underlying vessel. Mild mucosal thickening noted about the left maxillary sinus.  Paranasal sinuses are otherwise clear. Trace left mastoid effusion, of doubtful significance. Other: None. IMPRESSION: 1. Small acute to early subacute small vessel infarcts involving the subcortical white matter of the anterior right frontal corona radiata and right thalamus. No associated hemorrhage or mass effect. 2. Marked lateral and third ventriculomegaly, somewhat out of proportion to overlying cortical sulcation. Clinical correlation for possible in pH is advised. Possible underlying cerebral aqueductal stenosis would be the primary differential consideration. No significant transependymal flow of CSF, suggesting at this is a chronic and compensated finding. 3. Underlying mild chronic microvascular ischemic disease. Small remote left cerebellar infarct. Electronically Signed   By: Jeannine Boga M.D.   On: 02/18/2023 19:49   CT Head Wo Contrast  Result Date: 02/18/2023 CLINICAL DATA:  Neuro deficit, increased fatigue and weakness EXAM: CT HEAD WITHOUT CONTRAST TECHNIQUE: Contiguous axial images were obtained from the base of the skull through the vertex without intravenous contrast. RADIATION DOSE REDUCTION: This exam was performed according to the departmental dose-optimization program which includes automated exposure control, adjustment of the mA and/or kV according to patient size and/or use of iterative reconstruction technique. COMPARISON:  None Available. FINDINGS: Brain: Ventriculomegaly, without acute callosal angle or sulcal crowding at the vertex, favored to represent ex vacuo dilatation in the setting of diffuse cerebral volume loss, which is most prominent in the frontal and parietal lobes, with less significant atrophy in the temporal and right occipital lobe. No evidence of acute infarction, hemorrhage, mass, mass effect, or midline shift. Sequela of remote infarct in the inferior left cerebellum. No extra-axial fluid collection. Partial empty sella. Normal craniocervical junction.  Vascular: No hyperdense vessel. Skull: Negative for fracture or focal lesion. Sinuses/Orbits: No acute finding. Other: The mastoid air cells are well aerated. IMPRESSION: 1. No acute intracranial process. 2. Ventriculomegaly, favored to represent ex vacuo dilatation in the setting of diffuse cerebral volume loss, which is most prominent in the frontal and parietal lobes, with less significant atrophy in the temporal and right occipital lobe. Electronically Signed   By: Merilyn Baba M.D.   On: 02/18/2023 16:47   DG Chest 2 View  Result Date: 02/18/2023 CLINICAL DATA:  Fatigue. EXAM: CHEST - 2 VIEW COMPARISON:  December 11, 2004. FINDINGS: The heart size and mediastinal contours are within normal limits. Right lung is clear. Left lingular opacity is noted concerning for pneumonia or atelectasis. Mild compression deformity of a midthoracic vertebral body is noted most consistent with old fracture. IMPRESSION: Left lingular opacity is noted most consistent with pneumonia or atelectasis. Followup PA and lateral chest X-ray is recommended in 3-4 weeks following trial of antibiotic therapy to ensure resolution and exclude underlying malignancy. Electronically Signed   By: Marijo Conception M.D.   On: 02/18/2023  16:21    EKG: Independently reviewed. Sinus rhythm.   Assessment/Plan   1. Ischemic CVA; ventriculomegaly  - Appreciate neurology recommendations   - Continue cardiac monitoring with frequent neuro checks, check echo, MRA head & neck, A1c, and lipids, consult PT/OT/SLP, start ASA mg daily, and reevaulate need for NPH workup after he had recovered from the CVA    2. 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 Abnormal CXR  - Lingular opacity noted on CXR  - No signs or symptoms of PNA, procalcitonin pending  - Repeat imaging in 3-4 wks recommended    4. Hypertensive urgency  -  Appears to be outside window for permissive hypertension  - Valsartan-HCTZ held in light of acute increase in creatinine  - Use labetalol as-needed for now    DVT prophylaxis: Lovenox  Code Status: Full  Level of Care: Level of care: Telemetry Medical Family Communication: Wife at bedside   Disposition Plan:  Patient is from: Home Anticipated d/c is to: TBD Anticipated d/c date is: Possibly as early as 02/20/23  Patient currently: Pending CVA workup   Consults called: Neurology  Admission status: Observation     Vianne Bulls, MD [facility]  02/19/2023, 5:41 AM

## 2023-02-19 NOTE — Consult Note (Signed)
Neurology Consultation Reason for Consult: Stroke Referring Physician: Opyd, T  CC: Stroke  History is obtained from: Patient  HPI: Micheal Ayers is a 62 y.o. male who presents with trouble walking that is been going on for a couple of weeks.  He checked his blood pressure and found his systolic to be high and that is what caused him to present to the emergency department at [place].  His wife also notes that he has been slurring his words some over the past week.  Due to the symptoms he was brought to [facility] to get an MRI which reveals several small strokes as well as emergent ventricles.   LKW: 2 weeks ago tnk given?: no, outside of window   Past Medical History:  Diagnosis Date   ADD (attention deficit disorder)    Allergic rhinitis, cause unspecified     Family History  Problem Relation Age of Onset   Hypertension Other    Alcohol abuse Other    Hypertension Other    Kidney disease Other    Cancer Other    Alcohol abuse Other    Arthritis Other    Stroke Other    Hypertension Other    Cancer Other      Social History:  reports that he has quit smoking. He has never used smokeless tobacco. He reports current alcohol use. He reports that he does not use drugs.   Exam: Current vital signs: BP (!) 154/108 (BP Location: Right Arm)   Pulse 80   Temp 98 F (36.7 C) (Oral)   Resp 17   Ht 5\' 10"  (1.778 m)   Wt 107 kg   SpO2 100%   BMI 33.86 kg/m  Vital signs in last 24 hours: Temp:  [97.8 F (36.6 C)-98.3 F (36.8 C)] 98 F (36.7 C) (03/25 0059) Pulse Rate:  [65-102] 80 (03/25 0411) Resp:  [16-22] 17 (03/25 0411) BP: (149-173)/(106-117) 154/108 (03/25 0411) SpO2:  [96 %-100 %] 100 % (03/25 0411) Weight:  OH:[id number] kg] 107 kg (03/24 1447)   Physical Exam  Appears well-developed and well-nourished.   Neuro: Mental Status: Patient is awake, alert, oriented to person, place, month, year, and situation. Patient is able to give a clear and coherent  history. No signs of aphasia or neglect Cranial Nerves: II: Visual Fields are full. Pupils are equal, round, and reactive to light.   III,IV, VI: EOMI without ptosis or diploplia.  V: Facial sensation is symmetric to temperature VII: Facial movement is symmetric.  VIII: hearing is intact to voice X: Uvula elevates symmetrically XI: Shoulder shrug is symmetric. XII: tongue is midline without atrophy or fasciculations.  Motor: Tone is normal. Bulk is normal. 5/5 strength was present in all four extremities.  Sensory: Sensation is symmetric to light touch and temperature in the arms and legs. Cerebellar: FNF and HKS are intact bilaterally   I have reviewed labs in epic and the results pertinent to this consultation are: Creatinine 1.41  I have reviewed the images obtained: MRI brain-multiple small acute strokes  Impression: 62 year old male with multiple small acute strokes.  My suspicion is this represents concurrent small vessel disease as opposed to emboli, but he will need a workup for both.  I do not think that evaluating for NPH in the setting of acute strokes would be the best time, I would let him recover from this and then reevaluate the need for NPH evaluation.  The ventricles are slightly larger compared to the degree of atrophy,  but it is only slight and there is no transependymal flow, so I think I would favor not NPH.  Recommendations: - HgbA1c, fasting lipid panel - Frequent neuro checks - Echocardiogram - MRA head and neck - Prophylactic therapy-aspirin 81 mg daily - Risk factor modification - Telemetry monitoring - PT consult, OT consult, Speech consult - Stroke team to follow    Roland Rack, MD [facility] [phone]  If 7pm- 7am, please page neurology on call as listed in [place].

## 2023-02-19 NOTE — Evaluation (Signed)
Physical Therapy Evaluation Patient Details Name: MELVYN DEREN MRN: [id number] DOB: 12/27/1960 Today's Date: 02/19/2023  History of Present Illness  Pt is a 62 y.o. M who presents 02/18/2023 with slurred speech and difficulty in balance. MRI showing several small strokes as well as emergent ventricles. Significant PMH: none.  Clinical Impression  PTA, pt lives with his spouse and works for a [employer]. Pt presents with equal strength in BLE's and normal sensation and proprioception. Pt ambulating 600 ft with no assistive device and negotiated steps with a railing without physical assist. Demonstrates left drift and decreased left step length. Scoring 19/24 on the DGI, indicating impairments in dynamic balance. Would benefit from neuro OPPT to address and maximize functional independence.     Recommendations for follow up therapy are one component of a multi-disciplinary discharge planning process, led by the attending physician.  Recommendations may be updated based on patient status, additional functional criteria and insurance authorization.         Assistance Recommended at Discharge PRN  Patient can return home with the following  Assist for transportation    Equipment Recommendations None recommended by PT  Recommendations for Other Services       Functional Status Assessment Patient has had a recent decline in their functional status and demonstrates the ability to make significant improvements in function in a reasonable and predictable amount of time.     Precautions / Restrictions Precautions Precautions: Fall Restrictions Weight Bearing Restrictions: No      Mobility  Bed Mobility Overal bed mobility: Independent                  Transfers Overall transfer level: Independent Equipment used: None                    Ambulation/Gait Ambulation/Gait assistance: Supervision Gait Distance (Feet): 600 Feet Assistive device: None Gait  Pattern/deviations: Step-through pattern, Decreased stride length, Drifts right/left, Decreased step length - left Gait velocity: decreased     General Gait Details: Tendency to drift left, cues for correction. Shortened L step length, but adequate clearance  Stairs Stairs: Yes Stairs assistance: Supervision Stair Management: One rail Right Number of Stairs: 2    Wheelchair Mobility    Modified Rankin (Stroke Patients Only) Modified Rankin (Stroke Patients Only) Pre-Morbid Rankin Score: No symptoms Modified Rankin: Moderately severe disability     Balance Overall balance assessment: Needs assistance Sitting-balance support: Feet supported Sitting balance-Leahy Scale: Good     Standing balance support: No upper extremity supported, During functional activity Standing balance-Leahy Scale: Good                   Standardized Balance Assessment Standardized Balance Assessment : Dynamic Gait Index   Dynamic Gait Index Level Surface: Mild Impairment Change in Gait Speed: Mild Impairment Gait with Horizontal Head Turns: Normal Gait with Vertical Head Turns: Normal Gait and Pivot Turn: Normal Step Over Obstacle: Mild Impairment Step Around Obstacles: Mild Impairment Steps: Mild Impairment Total Score: 19       Pertinent Vitals/Pain Pain Assessment Pain Assessment: No/denies pain    Home Living Family/patient expects to be discharged to:: Private residence Living Arrangements: Spouse/significant other;Children (spouse, daughter) Available Help at Discharge: Family   Home Access: Stairs to enter   Technical brewer of Steps: 2 Alternate Level Stairs-Number of Steps:  (flight) Home Layout: Two level        Prior Function Prior Level of Function : Independent/Modified Independent  Mobility Comments: works for [employer], office duties       Journalist, newspaper        Extremity/Trunk Assessment   Upper Extremity  Assessment Upper Extremity Assessment: RUE deficits/detail;LUE deficits/detail RUE Deficits / Details: Strength 5/5 LUE Deficits / Details: Strength 5/5    Lower Extremity Assessment Lower Extremity Assessment: RLE deficits/detail;LLE deficits/detail RLE Deficits / Details: Strength 5/5 RLE Sensation: WNL LLE Deficits / Details: Strength 5/5 LLE Sensation: WNL    Cervical / Trunk Assessment Cervical / Trunk Assessment: Normal  Communication   Communication: No difficulties  Cognition Arousal/Alertness: Awake/alert Behavior During Therapy: WFL for tasks assessed/performed Overall Cognitive Status: Within Functional Limits for tasks assessed                                          General Comments      Exercises     Assessment/Plan    PT Assessment Patient needs continued PT services  PT Problem List Decreased balance;Decreased mobility       PT Treatment Interventions Gait training;Stair training;Functional mobility training;Therapeutic activities;Therapeutic exercise;Balance training    PT Goals (Current goals can be found in the Care Plan section)  Acute Rehab PT Goals Patient Stated Goal: return to baseline PT Goal Formulation: With patient Time For Goal Achievement: 03/05/23 Potential to Achieve Goals: Good    Frequency Min 2X/week     Co-evaluation               AM-PAC PT "6 Clicks" Mobility  Outcome Measure Help needed turning from your back to your side while in a flat bed without using bedrails?: None Help needed moving from lying on your back to sitting on the side of a flat bed without using bedrails?: None Help needed moving to and from a bed to a chair (including a wheelchair)?: None Help needed standing up from a chair using your arms (e.g., wheelchair or bedside chair)?: None Help needed to walk in hospital room?: A Little Help needed climbing 3-5 steps with a railing? : A Little 6 Click Score: 22    End of Session  Equipment Utilized During Treatment: Gait belt Activity Tolerance: Patient tolerated treatment well Patient left: in bed;with call bell/phone within reach;with bed alarm set Nurse Communication: Mobility status PT Visit Diagnosis: Unsteadiness on feet (R26.81)    [id number] PT Time Calculation (min) (ACUTE ONLY): 32 min   Charges:   PT Evaluation $PT Eval Low Complexity: 1 Low PT Treatments $Therapeutic Activity: 8-22 mins        Wyona Almas, PT, DPT Acute Rehabilitation Services Office [phone]   Deno Etienne 02/19/2023, 10:27 AM

## 2023-02-19 NOTE — Evaluation (Signed)
Speech Language Pathology Evaluation Patient Details Name: Micheal Ayers MRN: [id number] DOB: 22-May-1961 Today's Date: 02/19/2023 Time: [id number] SLP Time Calculation (min) (ACUTE ONLY): 16 min  Problem List:  Patient Active Problem List   Diagnosis Date Noted   Ischemic cerebrovascular accident (CVA) ([place]) 02/19/2023   Cerebral ventriculomegaly 02/19/2023   Acute renal failure superimposed on stage 2 chronic kidney disease ([place]) 02/19/2023   Hypertensive urgency 02/19/2023   Abnormal CXR 02/19/2023   Fatigue 09/20/2012   Family history of hemochromatosis 09/20/2012   Preventative health care 09/06/2012   ADD (attention deficit disorder)    Allergic rhinitis, cause unspecified    Past Medical History:  Past Medical History:  Diagnosis Date   ADD (attention deficit disorder)    Allergic rhinitis, cause unspecified    Past Surgical History:  Past Surgical History:  Procedure Laterality Date   elbow surgury  1969   left elbow   INGUINAL HERNIA REPAIR  2003   left   HPI:  Pt is a 62 y.o. M who presented 02/18/23 with slurred speech and balance difficulty. MRI brian 3/24: Small acute to early subacute small vessel infarcts involving the subcortical white matter of the anterior right frontal corona radiata and right thalamus. PMH: ADD.   Assessment / Plan / Recommendation Clinical Impression  Pt participated in speech-language-cognition evaluation. 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 He reported that he is on medication for ADD, but still has some difficulty attending at baseline. Pt reported that he has recently noticed that he has been having difficulty counting money at work and that he has been forgetting whether he has completed tasks. He reported that his speech is now "slurred" and that it is now approximately 75% back to baseline. The [facility] Mental Status Examination  was completed to evaluate the pt's cognitive-linguistic skills. He achieved a score of 28/30 which is within the normal limits of 27 or more out of 30. However, pt stated that he believes his cognition and processing speed are slower. Some difficulty was noted with memory and tasks which required executive functions; self-correction and additional processing time were often necessary for accuracy during these tasks. Pt also presented with mild dysarthria characterized by reduced articulatory precision and a reduced ability to adequately coordinate respiration with speech at the conversational level. Speech intelligibility was occasionally impacted during conversation. Pt became tearful towards the end of the evaluation and expressed that he was having difficulty emotionally processing everything; pt verbalized agreement with spiritual care consult and Ty, RN advised that she will place the order. Skilled SLP services are clinically indicated at this time to improve motor speech and cognitive-linguistic function.    SLP Assessment  SLP Recommendation/Assessment: Patient needs continued Speech Lanaguage Pathology Services SLP Visit Diagnosis: Cognitive communication deficit (R41.841);Dysarthria and anarthria (R47.1)    Recommendations for follow up therapy are one component of a multi-disciplinary discharge planning process, led by the attending physician.  Recommendations may be updated based on patient status, additional functional criteria and insurance authorization.    Follow Up Recommendations  Outpatient SLP    Assistance Recommended at Discharge     Functional Status Assessment Patient has had a recent decline in their functional status and demonstrates the ability to make significant improvements in function in a reasonable and predictable amount of time.  Frequency and Duration min 2x/week  2 weeks      SLP Evaluation  Cognition  Overall Cognitive Status: Impaired/Different from  baseline Arousal/Alertness: Awake/alert Orientation Level: Oriented X4 Year: 2024 Month: March Day of Week: Correct Attention: Focused;Sustained;Selective Focused Attention: Appears intact Sustained Attention: Appears intact Selective Attention: Impaired Selective Attention Impairment: Verbal complex Memory: Impaired Memory Impairment:  (Immediate: 5/5; delayed: 3/5; with cues: 2/2) Awareness: Appears intact Problem Solving: Impaired Problem Solving Impairment: Verbal complex (Money: 3/3; time: 1/1, but with reported difficulty processing) Executive Function: Reasoning;Sequencing;Organizing Reasoning: Appears intact Sequencing:  (clock: 4/4) Organizing: Impaired Organizing Impairment: Verbal complex (backward digit span: 2/2 with additional processing time)       Comprehension  Auditory Comprehension Overall Auditory Comprehension: Appears within functional limits for tasks assessed Yes/No Questions: Within Functional Limits Commands: Within Functional Limits    Expression Expression Primary Mode of Expression: Verbal Verbal Expression Overall Verbal Expression: Appears within functional limits for tasks assessed Initiation: No impairment Level of Generative/Spontaneous Verbalization: Conversation Repetition: No impairment Naming: No impairment   Oral / Motor  Oral Motor/Sensory Function Overall Oral Motor/Sensory Function: Mild impairment Motor Speech Overall Motor Speech: Impaired Respiration: Impaired Level of Impairment: Conversation Phonation: Low vocal intensity Articulation: Impaired Level of Impairment: Conversation Intelligibility: Intelligibility reduced Word: 75-100% accurate Phrase: 75-100% accurate Sentence: 75-100% accurate Conversation: 75-100% accurate Motor Planning: Witnin functional limits Motor Speech Errors: Aware;Consistent           Azure Barrales I. Hardin Negus, [place], [place] Office number [phone]  Horton Marshall 02/19/2023, 1:16 PM

## 2023-02-19 NOTE — Progress Notes (Signed)
*  PRELIMINARY RESULTS* Echocardiogram 2D Echocardiogram has been performed.  Micheal Ayers 02/19/2023, 2:59 PM

## 2023-02-19 NOTE — ED Provider Notes (Signed)
Pt transferred for MRI due to difficulty walking for 2 weeks.  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  Discussed with neurologist-recommends admission for ongoing workup and treatment.  Medicine consulted for admission for ongoing care.  Patient and wife updated of findings of studies and they are in agreement with treatment plan.   Quintella Reichert, MD 02/19/23 [phone]

## 2023-02-20 ENCOUNTER — Inpatient Hospital Stay (HOSPITAL_COMMUNITY): Payer: 59

## 2023-02-20 DIAGNOSIS — I639 Cerebral infarction, unspecified: Secondary | ICD-10-CM | POA: Diagnosis not present

## 2023-02-20 LAB — CBC
HCT: 49.8 % (ref 39.0–52.0)
Hemoglobin: 17.1 g/dL — ABNORMAL HIGH (ref 13.0–17.0)
MCH: 30.2 pg (ref 26.0–34.0)
MCHC: 34.3 g/dL (ref 30.0–36.0)
MCV: 88 fL (ref 80.0–100.0)
Platelets: 219 10*3/uL (ref 150–400)
RBC: 5.66 MIL/uL (ref 4.22–5.81)
RDW: 12.4 % (ref 11.5–15.5)
WBC: 6.2 10*3/uL (ref 4.0–10.5)
nRBC: 0 % (ref 0.0–0.2)

## 2023-02-20 LAB — GLUCOSE, CSF: Glucose, CSF: 65 mg/dL (ref 40–70)

## 2023-02-20 LAB — HEMOGLOBIN A1C
Hgb A1c MFr Bld: 6 % — ABNORMAL HIGH (ref 4.8–5.6)
Mean Plasma Glucose: 126 mg/dL

## 2023-02-20 LAB — BASIC METABOLIC PANEL
Anion gap: 9 (ref 5–15)
BUN: 18 mg/dL (ref 6–20)
CO2: 26 mmol/L (ref 22–32)
Calcium: 9.2 mg/dL (ref 8.9–10.3)
Chloride: 101 mmol/L (ref 98–111)
Creatinine, Ser: 0.99 mg/dL (ref 0.61–1.24)
GFR, Estimated: >60 mL/min (ref >60)
Glucose, Bld: 107 mg/dL — ABNORMAL HIGH (ref 70–99)
Potassium: 4 mmol/L (ref 3.5–5.1)
Sodium: 136 mmol/L (ref 135–145)

## 2023-02-20 LAB — CSF CELL COUNT WITH DIFFERENTIAL
RBC Count, CSF: 0 /mm3
Tube #: 3
WBC, CSF: 1 /mm3 (ref 0–5)

## 2023-02-20 LAB — MAGNESIUM: Magnesium: 1.9 mg/dL (ref 1.7–2.4)

## 2023-02-20 LAB — PROTEIN, CSF: Total  Protein, CSF: 60 mg/dL — ABNORMAL HIGH (ref 15–45)

## 2023-02-20 MED ORDER — LIDOCAINE HCL (PF) 1 % IJ SOLN
5.0000 mL | Freq: Once | INTRAMUSCULAR | Status: AC
  Administered 2023-02-20: 2 mL

## 2023-02-20 MED ORDER — HYDROCHLOROTHIAZIDE 12.5 MG PO TABS
12.5000 mg | ORAL_TABLET | Freq: Every day | ORAL | Status: DC
  Administered 2023-02-20 – 2023-02-21 (×2): 12.5 mg via ORAL
  Filled 2023-02-20 (×2): qty 1

## 2023-02-20 MED ORDER — LIDOCAINE HCL (PF) 1 % IJ SOLN: 5.0000 mL | Freq: Once | INTRAMUSCULAR | Status: DC

## 2023-02-20 MED ORDER — LORAZEPAM 0.5 MG PO TABS: 0.5000 mg | ORAL_TABLET | Freq: Four times a day (QID) | ORAL | Status: DC | PRN

## 2023-02-20 MED ORDER — LIDOCAINE HCL (PF) 1 % IJ SOLN
INTRAMUSCULAR | Status: AC
  Administered 2023-02-20: 5 mL
  Filled 2023-02-20: qty 5

## 2023-02-20 NOTE — Plan of Care (Signed)
  Problem: Education: Goal: Knowledge of disease or condition will improve Outcome: Progressing   Problem: Coping: Goal: Will verbalize positive feelings about self Outcome: Progressing Goal: Will identify appropriate support needs Outcome: Progressing   Problem: Self-Care: Goal: Ability to participate in self-care as condition permits will improve Outcome: Progressing Goal: Ability to communicate needs accurately will improve Outcome: Progressing   Problem: Nutrition: Goal: Dietary intake will improve Outcome: Progressing   Problem: Education: Goal: Knowledge of General Education information will improve Description: Including pain rating scale, medication(s)/side effects and non-pharmacologic comfort measures Outcome: Progressing   Problem: Activity: Goal: Risk for activity intolerance will decrease Outcome: Progressing   Problem: Nutrition: Goal: Adequate nutrition will be maintained Outcome: Progressing

## 2023-02-20 NOTE — Procedures (Signed)
Successful image guided LP from L2-L3 level. Clear colorless CSF. 57mL removed, 67mL sent to lab. No complications. See PACS report for details   Ascencion Dike [facility] Interventional Radiology 02/20/2023 4:47 PM

## 2023-02-20 NOTE — Progress Notes (Addendum)
STROKE TEAM PROGRESS NOTE   INTERVAL HISTORY Patient lying in bed in no acute distress.  Wife is at bedside. After discussion with patient and wife, patient has elected to have the LP procedure today for the noted hydrocephalus.   Unsuccessful attempt at bedside, please see procedure note. We will order for fluoroscopy-guided LP tomorrow.  LP will need to be done with physical therapy assessment before and after to evaluate effectiveness of release of pressure on patient's symptoms of balance issues.   Vitals:   02/19/23 2320 02/20/23 0404 02/20/23 0718 02/20/23 1106  BP: (!) 154/100 (!) 144/89 (!) 151/87 (!) 135/93  Pulse: 92 80 75 85  Resp: 18 18 18 16   Temp: 98.1 F (36.7 C) 98 F (36.7 C) 98.1 F (36.7 C) 98.1 F (36.7 C)  TempSrc: Oral Oral Oral Oral  SpO2: 98% 97% 98% 98%  Weight:      Height:       CBC:  Recent Labs  Lab 02/18/23 1528 02/20/23 0717  WBC 9.8 6.2  NEUTROABS 6.1  --   HGB 16.7 17.1*  HCT 48.5 49.8  MCV 86.9 88.0  PLT 255 [id number]    Basic Metabolic Panel:  Recent Labs  Lab 02/19/23 1230 02/20/23 0717  NA 138 136  K 3.9 4.0  CL 104 101  CO2 25 26  GLUCOSE 107* 107*  BUN 20 18  CREATININE 1.00 0.99  CALCIUM 9.2 9.2  MG  --  1.9    Lipid Panel:  Recent Labs  Lab 02/19/23 0605  CHOL 176  TRIG 107  HDL 37*  CHOLHDL 4.8  VLDL 21  LDLCALC 118*    HgbA1c:  Recent Labs  Lab 02/19/23 0605  HGBA1C 6.0*   Urine Drug Screen: No results for input(s): "LABOPIA", "COCAINSCRNUR", "LABBENZ", "AMPHETMU", "THCU", "LABBARB" in the last 168 hours.  Alcohol Level No results for input(s): "ETH" in the last 168 hours.  IMAGING past 24 hours ECHOCARDIOGRAM COMPLETE  Result Date: 02/19/2023    ECHOCARDIOGRAM REPORT   Patient Name:   BOMANI PIOTROWICZ [facility] Date of Exam: 02/19/2023 Medical Rec #:  [id number]        Height:       70.0 in Accession #:    [id number]       Weight:       236.0 lb Date of Birth:  May 25, 1961        BSA:          2.239 m Patient Age:     62 years         BP:           134/92 mmHg Patient Gender: M                HR:           74 bpm. Exam Location:  Inpatient Procedure: 2D Echo, Cardiac Doppler and Color Doppler Indications:    Stroke  History:        Patient has no prior history of Echocardiogram examinations.                 Stroke, Signs/Symptoms:Fatigue; Risk Factors:Hypertension.  Sonographer:    Wenda Low Referring Phys: [id number] [place]  1. Left ventricular ejection fraction, by estimation, is 60 to 65%. The left ventricle has normal function. The left ventricle has no regional wall motion abnormalities. Left ventricular diastolic parameters were normal.  2. Right ventricular systolic function is normal. The right ventricular size is normal. There  is normal pulmonary artery systolic pressure.  3. The mitral valve is normal in structure. No evidence of mitral valve regurgitation. No evidence of mitral stenosis.  4. The aortic valve is normal in structure. Aortic valve regurgitation is not visualized. No aortic stenosis is present.  5. The inferior vena cava is normal in size with greater than 50% respiratory variability, suggesting right atrial pressure of 3 mmHg. FINDINGS  Left Ventricle: Left ventricular ejection fraction, by estimation, is 60 to 65%. The left ventricle has normal function. The left ventricle has no regional wall motion abnormalities. The left ventricular internal cavity size was normal in size. There is  no left ventricular hypertrophy. Left ventricular diastolic parameters were normal. Right Ventricle: The right ventricular size is normal. No increase in right ventricular wall thickness. Right ventricular systolic function is normal. There is normal pulmonary artery systolic pressure. The tricuspid regurgitant velocity is 1.68 m/s, and  with an assumed right atrial pressure of 8 mmHg, the estimated right ventricular systolic pressure is 123456 mmHg. Left Atrium: Left atrial size was normal in size.  Right Atrium: Right atrial size was normal in size. Pericardium: There is no evidence of pericardial effusion. Mitral Valve: The mitral valve is normal in structure. No evidence of mitral valve regurgitation. No evidence of mitral valve stenosis. MV peak gradient, 4.6 mmHg. The mean mitral valve gradient is 1.0 mmHg. Tricuspid Valve: The tricuspid valve is normal in structure. Tricuspid valve regurgitation is not demonstrated. No evidence of tricuspid stenosis. Aortic Valve: The aortic valve is normal in structure. Aortic valve regurgitation is not visualized. No aortic stenosis is present. Aortic valve mean gradient measures 3.0 mmHg. Aortic valve peak gradient measures 5.7 mmHg. Aortic valve area, by VTI measures 2.91 cm. Pulmonic Valve: The pulmonic valve was normal in structure. Pulmonic valve regurgitation is trivial. No evidence of pulmonic stenosis. Aorta: The aortic root is normal in size and structure. Venous: The inferior vena cava is normal in size with greater than 50% respiratory variability, suggesting right atrial pressure of 3 mmHg. IAS/Shunts: No atrial level shunt detected by color flow Doppler.  LEFT VENTRICLE PLAX 2D LVIDd:         4.60 cm   Diastology LVIDs:         2.70 cm   LV e' medial:    4.79 cm/s LV PW:         1.50 cm   LV E/e' medial:  11.1 LV IVS:        1.30 cm   LV e' lateral:   9.68 cm/s LVOT diam:     2.10 cm   LV E/e' lateral: 5.5 LV SV:         75 LV SV Index:   33 LVOT Area:     3.46 cm  RIGHT VENTRICLE RV Basal diam:  3.80 cm RV Mid diam:    3.80 cm RV S prime:     13.30 cm/s TAPSE (M-mode): 2.6 cm LEFT ATRIUM             Index        RIGHT ATRIUM           Index LA diam:        3.80 cm 1.70 cm/m   RA Area:     19.90 cm LA Vol (A2C):   53.5 ml 23.89 ml/m  RA Volume:   58.50 ml  26.12 ml/m LA Vol (A4C):   60.0 ml 26.79 ml/m LA Biplane Vol: 58.1 ml 25.94  ml/m  AORTIC VALVE                    PULMONIC VALVE AV Area (Vmax):    3.11 cm     PV Vmax:       0.67 m/s AV Area  (Vmean):   3.02 cm     PV Peak grad:  1.8 mmHg AV Area (VTI):     2.91 cm AV Vmax:           119.00 cm/s AV Vmean:          75.400 cm/s AV VTI:            0.257 m AV Peak Grad:      5.7 mmHg AV Mean Grad:      3.0 mmHg LVOT Vmax:         107.00 cm/s LVOT Vmean:        65.800 cm/s LVOT VTI:          0.216 m LVOT/AV VTI ratio: 0.84  AORTA Ao Root diam: 4.00 cm Ao Asc diam:  3.50 cm MITRAL VALVE               TRICUSPID VALVE MV Area (PHT): 3.10 cm    TR Peak grad:   11.3 mmHg MV Area VTI:   3.98 cm    TR Vmax:        168.00 cm/s MV Peak grad:  4.6 mmHg MV Mean grad:  1.0 mmHg    SHUNTS MV Vmax:       1.07 m/s    Systemic VTI:  0.22 m MV Vmean:      45.8 cm/s   Systemic Diam: 2.10 cm MV Decel Time: 245 msec MV E velocity: 53.10 cm/s MV A velocity: 91.70 cm/s MV E/A ratio:  0.58 Aditya Sabharwal Electronically signed by Hebert Soho Signature Date/Time: 02/19/2023/3:32:52 PM    Final     PHYSICAL EXAM  Temp:  [98 F (36.7 C)-98.6 F (37 C)] 98.1 F (36.7 C) (03/26 1106) Pulse Rate:  [75-92] 85 (03/26 1106) Resp:  [16-18] 16 (03/26 1106) BP: (132-159)/(84-104) 135/93 (03/26 1106) SpO2:  [95 %-99 %] 98 % (03/26 1106)  Physical Exam  Appears well-developed and well-nourished.  Middle-aged Caucasian male   Neuro: Mental Status: Patient is awake, alert, oriented to person, place, month, year, and situation. Patient is able to give a clear and coherent history. No signs of aphasia or neglect.  Able to name previous 5 presidents/multiple animals when requested, able to correctly add, remembered 2 out of 3 words when asked after multiple minutes of conversation Cranial Nerves: II: Visual Fields are full. Pupils are equal, round, and reactive to light.   III,IV, VI: EOMI without ptosis or diploplia.  V: Facial sensation is symmetric to temperature VII: Facial movement is symmetric.  VIII: hearing is intact to voice X: Uvula elevates symmetrically XI: Shoulder shrug is symmetric. XII: tongue is  midline without atrophy or fasciculations.  Motor: Tone is normal. Bulk is normal. 5/5 strength was present in all four extremities.  Sensory: Sensation is symmetric to light touch and temperature in the arms and legs. Cerebellar: FNF intact bilaterally   ASSESSMENT/PLAN Mr. Micheal Ayers is a 62 y.o. male with history of ADD, low testosterone presenting with balance issues x 2 weeks without any focal weakness noted.  Wife also stated that he was slurring words over the past week.  MRI revealed several small strokes, along with hydrocephalus. Symptoms have improved.  Multiple small acute strokes, likely due to concurrent small vessel disease.  Concurrent with normal pressure hydrocephalus  CT head:  No acute intracranial process. Ventriculomegaly, favored to represent ex vacuo dilatation in the setting of diffuse cerebral volume loss, which is most prominent in the frontal and parietal lobes, with less significant atrophy in the temporal and right occipital lobe. MRI  Small acute to early subacute small vessel infarcts involving the subcortical white matter of the anterior right frontal corona radiata and right thalamus. No associated hemorrhage or mass effect. Marked lateral and third ventriculomegaly, somewhat out of proportion to overlying cortical sulcation. Clinical correlation for possible in pH is advised. Possible underlying cerebral aqueductal stenosis would be the primary differential consideration. No significant transependymal flow of CSF, suggesting at this is a chronic and compensated finding. Underlying mild chronic microvascular ischemic disease. Small remote left cerebellar infarct MRA:  Motion degraded head MRA without evidence of a large vessel occlusion or flow limiting proximal stenosis. Patent cervical carotid and vertebral arteries without evidence of a flow limiting stenosis within the above described study limitations  2D Echo: EF 60 to 65%, no LVH,  normal pulmonary artery systolic pressure no MVR, no TVR, no AVR trivial pulmonic valve regurgitation.  LDL 118 HgbA1c No results found for requested labs within last 1095 days. VTE prophylaxis - lovenox    Diet   Diet regular Room service appropriate? No; Fluid consistency: Thin   aspirin 81 mg daily prior to admission, now on aspirin. Recommend aspirin and Plavix on discharge x 3 weeks then Plavix alone. PLAVIX on HOLD due to pending inpatient LP procedure Therapy recommendations:  Home with assistance Disposition:  home (lives with wife)  Normal-Pressure Hydrocephalus MRI  Marked lateral and third ventriculomegaly, somewhat out of proportion to overlying cortical sulcation. Clinical correlation for possible in pH is advised. Possible underlying cerebral aqueductal stenosis would be the primary differential consideration. No significant transependymal flow of CSF, suggesting at this is a chronic and compensated finding. Bedside LP unsuccessful.  Pending fluoroscopy guided LP.  Monitor for increased NPH symptoms (balance issues, mood changes, forgetfulness/confusion) SAT CT with any acute worsening in mental status  Hypertension Home meds:  Diovan Permissive hypertension (OK if < 220/120) but gradually normalize in 5-7 days Long-term BP goal normotensive  Hyperlipidemia Home meds:  none LDL 118, goal < 70 Add Crestor 10mg   Continue statin at discharge  Other Stroke Risk Factors Former Cigarette smoker Obesity, Body mass index is 33.86 kg/m., BMI >/= 30 associated with increased stroke risk, recommend weight loss, diet and exercise as appropriate  Family hx stroke (grandparent)  Other Active Problems 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 Note/plan to be edited by MD as needed.    Otelia Santee, DNP, AGACNP-BC [facility] Please use AMION for pager and EPIC for messaging I have personally obtained  history,examined this patient, reviewed notes, independently viewed imaging studies, participated in medical decision making and plan of care.ROS completed by me personally and pertinent positives fully documented  I have made any additions or clarifications directly to the above note. Agree with note above.  Patient neurologically is improved from his small lacunar strokes.  He does have mild cognitive difficulties and some balance problems of late symptoms for normal pressure hydrocephalus are soft and borderline likely we were doing diagnostic spinal tap.  Discussed risk benefits with patient and wife and answered questions and  agreeable to proceed however bedside spinal tap was unsuccessful.  We will schedule this under fluoroscopy tomorrow with physical therapy assessment for gait and balance prior to spinal tap and after spinal tap to see if there is any meaningful improvement.  Discussed with Dr. Posey Pronto and Dr. Malen Gauze..  Greater than 50% time during this 35-minute visit was spent on counseling and coordination of care about his lacunar strokes and normal pressure hydrocephalus and answering questions.  Antony Contras, MD Medical Director [facility] Pager: [phone] 02/20/2023 1:47 PM

## 2023-02-20 NOTE — Progress Notes (Signed)
[facility] Progress Note Patient: Micheal Ayers [id number] DOB: 06/25/1961 DOA: 02/18/2023  DOS: the patient was seen and examined on 02/20/2023  Brief hospital course: PMH of ADD present to the hospital with complaints of slurred speech and difficulty with balance. 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  Neurology was consulted.  Currently concerned with regards to NPH and would recommend LP before discharge. Assessment and Plan: Multiple acute small stroke. Likely small vessel disease. CT head unremarkable. MRI brain shows small acute to early subacute small vessel infarcts. MRA without large vessel occlusion. Echocardiogram shows preserved EF no valvular abnormality. LDL 118. Hemoglobin A1c currently pending. Cleared swallowing evaluation.  Currently on regular diet. Aspirin 81 mg daily prior to admission. Aspirin and Plavix on discharge for 3 weeks recommended then Plavix alone. Plavix currently on hold due to consideration for LP.  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 Neurology recommend LP and even shunt placement. LP performed.  Opening pressure 15 cm. Repeat PT evaluation requested.  HTN. On Diovan at home. Blood pressure currently stable. Monitor.  HLD. LDL 118. Currently on no medication. Crestor 10 mg recommended.  Former smoker.  Obesity. Body mass index is 33.86 kg/m.  Placing the patient at high risk of poor outcome.  Anxiety. Will initiate Ativan. Patient has concerns with regards to uncertainty and appears to be crying easily.  Subjective: No acute complaint.  No nausea no vomiting no fever no chills.  Continues to have ataxia.  Continues to have fatigue and tiredness.  Continues to have urinary urgency.  Physical Exam: Clear to auscultation. S1-S2 present. In mild distress. Severely anxious and crying. No asterixis. No pronator drift. Mild left-sided weakness  noted.  Data Reviewed: I have Reviewed nursing notes, Vitals, and Lab results. Reviewed CBC and BMP.  Reordered CBC and BMP.  Disposition: Status is: Inpatient Remains inpatient appropriate because: Need further workup for NPH.  enoxaparin (LOVENOX) injection 40 mg Start: 02/19/23 1815 SCD's Start: 02/19/23 0151   Family Communication: Wife at bedside Level of care: Telemetry Medical   Vitals:   02/20/23 0404 02/20/23 0718 02/20/23 1106 02/20/23 1822  BP: (!) 144/89 (!) 151/87 (!) 135/93 (!) 128/90  Pulse: 80 75 85 92  Resp: 18 18 16 16   Temp: 98 F (36.7 C) 98.1 F (36.7 C) 98.1 F (36.7 C) 98 F (36.7 C)  TempSrc: Oral Oral Oral Oral  SpO2: 97% 98% 98% 94%  Weight:      Height:         Author: Berle Mull, MD 02/20/2023 6:55 PM  Please look on [email or web address] to find out who is on call.

## 2023-02-20 NOTE — Progress Notes (Signed)
Patient back in the unit after Lumber puncture

## 2023-02-20 NOTE — Procedures (Signed)
LUMBAR PUNCTURE (SPINAL TAP) PROCEDURE NOTE  Indication: AMS, ?NPH   Proceduralists: A. Rory Percy, MD  S. Toberman, NP   Risks, benefits and alternatives of the procedure were dicussed with the patient including but not limited to post-LP headache, bleeding, infection, weakness/numbness of legs(radiculopathy), death.   Consent obtained from: patient   Procedure Note The patient was prepped and draped, and using sterile technique a 20 gauge quinke spinal needle was inserted in the L4-5 space.   Unsuccessful return of fluid on 4 tries. Procedure will be requested under fluoro.  -- Amie Portland, MD Neurologist [facility] Pager: [phone]

## 2023-02-20 NOTE — Progress Notes (Signed)
Speech Language Pathology Treatment: Dysphagia;Cognitive-Linquistic  Patient Details Name: Micheal Ayers MRN: [id number] DOB: Apr 03, 1961 Today's Date: 02/20/2023 Time: 1205-1230 SLP Time Calculation (min) (ACUTE ONLY): 25 min  Assessment / Plan / Recommendation Clinical Impression  Pt seen at bedside for skilled ST intervention targeting goals for swallow safety, and education of compensatory strategies. Pt's wife was present at bedside. Pt reported no difficulty with swallowing, and indicated tolerance of regular diet and thin liquids. Pt accepted trials of these consistencies, and exhibited no difficulty with these textures at bedside. SLP provided education regarding compensatory strategies to maximize functional recall and intelligibility. Pt was encouraged to write checklists or notes to facilitate recall. Pt indicated this is a challenge for him, so SLP suggested recording lists on smart phone to facilitate completion and functional recall. Pt's speech was noted to be fully intelligible during this session. His wife indicated the dysarthria seems to surface with fatigue at the end of the day. Pt/wife were encouraged to implement strategies to increase clarity of speech, including decrease rate, and over-articulate sounds.   At this point, the session was interrupted, and soon after the pt was engaged in a procedure. SLP unable to resume education at that time. Will continue efforts. Goals updated.    HPI HPI: Pt is a 62 y.o. M who presented 02/18/23 with slurred speech and balance difficulty. MRI brian 3/24: Small acute to early subacute small vessel infarcts involving the subcortical white matter of the anterior right frontal corona radiata and right thalamus. PMH: ADD.      SLP Plan  Continue with current plan of care      Recommendations for follow up therapy are one component of a multi-disciplinary discharge planning process, led by the attending physician.  Recommendations may be  updated based on patient status, additional functional criteria and insurance authorization.    Recommendations  Diet recommendations: Regular;Thin liquid Liquids provided via: Cup;Straw Medication Administration:  (as tolerated) Supervision: Patient able to self feed Compensations: Slow rate;Small sips/bites;Minimize environmental distractions Postural Changes and/or Swallow Maneuvers: Seated upright 90 degrees           Oral care BID     Cognitive communication deficit (R41.841);Dysarthria and anarthria (R47.1);Dysphagia, unspecified (R13.10)     Continue with current plan of care    Micheal Ayers B. Quentin Ore, [facility], [place] Speech Language Pathologist Office: [phone]  Shonna Chock 02/20/2023, 2:12 PM

## 2023-02-21 ENCOUNTER — Other Ambulatory Visit (HOSPITAL_COMMUNITY): Payer: Self-pay

## 2023-02-21 DIAGNOSIS — G912 (Idiopathic) normal pressure hydrocephalus: Secondary | ICD-10-CM | POA: Diagnosis not present

## 2023-02-21 DIAGNOSIS — I639 Cerebral infarction, unspecified: Secondary | ICD-10-CM | POA: Diagnosis not present

## 2023-02-21 DIAGNOSIS — N179 Acute kidney failure, unspecified: Secondary | ICD-10-CM | POA: Diagnosis not present

## 2023-02-21 DIAGNOSIS — I160 Hypertensive urgency: Secondary | ICD-10-CM | POA: Diagnosis not present

## 2023-02-21 LAB — BASIC METABOLIC PANEL
Anion gap: 10 (ref 5–15)
BUN: 16 mg/dL (ref 6–20)
CO2: 26 mmol/L (ref 22–32)
Calcium: 9.4 mg/dL (ref 8.9–10.3)
Chloride: 101 mmol/L (ref 98–111)
Creatinine, Ser: 0.99 mg/dL (ref 0.61–1.24)
GFR, Estimated: >60 mL/min (ref >60)
Glucose, Bld: 119 mg/dL — ABNORMAL HIGH (ref 70–99)
Potassium: 3.7 mmol/L (ref 3.5–5.1)
Sodium: 137 mmol/L (ref 135–145)

## 2023-02-21 LAB — CBC
HCT: 49.8 % (ref 39.0–52.0)
Hemoglobin: 17.1 g/dL — ABNORMAL HIGH (ref 13.0–17.0)
MCH: 29.8 pg (ref 26.0–34.0)
MCHC: 34.3 g/dL (ref 30.0–36.0)
MCV: 86.9 fL (ref 80.0–100.0)
Platelets: 230 10*3/uL (ref 150–400)
RBC: 5.73 MIL/uL (ref 4.22–5.81)
RDW: 12.5 % (ref 11.5–15.5)
WBC: 7.9 10*3/uL (ref 4.0–10.5)
nRBC: 0 % (ref 0.0–0.2)

## 2023-02-21 LAB — MAGNESIUM: Magnesium: 1.7 mg/dL (ref 1.7–2.4)

## 2023-02-21 MED ORDER — ROSUVASTATIN CALCIUM 10 MG PO TABS
10.0000 mg | ORAL_TABLET | Freq: Every day | ORAL | 2 refills | Status: DC
  Filled 2023-02-21: qty 30, 30d supply, fill #0

## 2023-02-21 MED ORDER — ORAL CARE MOUTH RINSE: 15.0000 mL | OROMUCOSAL | Status: DC | PRN

## 2023-02-21 MED ORDER — CLOPIDOGREL BISULFATE 75 MG PO TABS
75.0000 mg | ORAL_TABLET | Freq: Every day | ORAL | 0 refills | Status: AC
  Filled 2023-02-21: qty 21, 21d supply, fill #0

## 2023-02-21 MED ORDER — ASPIRIN 81 MG PO TBEC
81.0000 mg | DELAYED_RELEASE_TABLET | Freq: Every day | ORAL | 12 refills | Status: DC
  Filled 2023-02-21: qty 30, 30d supply, fill #0

## 2023-02-21 NOTE — Plan of Care (Signed)
  Problem: Education: Goal: Knowledge of disease or condition will improve Outcome: Progressing Goal: Knowledge of secondary prevention will improve (MUST DOCUMENT ALL) Outcome: Progressing Goal: Knowledge of patient specific risk factors will improve (Mark N/A or DELETE if not current risk factor) Outcome: Progressing   Problem: Coping: Goal: Will verbalize positive feelings about self Outcome: Progressing Goal: Will identify appropriate support needs Outcome: Progressing   

## 2023-02-21 NOTE — Plan of Care (Signed)
  Problem: Education: Goal: Knowledge of disease or condition will improve Outcome: Progressing   Problem: Ischemic Stroke/TIA Tissue Perfusion: Goal: Complications of ischemic stroke/TIA will be minimized Outcome: Progressing   Problem: Coping: Goal: Will verbalize positive feelings about self Outcome: Progressing   Problem: Health Behavior/Discharge Planning: Goal: Ability to manage health-related needs will improve Outcome: Progressing   Problem: Nutrition: Goal: Dietary intake will improve Outcome: Progressing   Problem: Education: Goal: Knowledge of General Education information will improve Description: Including pain rating scale, medication(s)/side effects and non-pharmacologic comfort measures Outcome: Progressing

## 2023-02-21 NOTE — Progress Notes (Signed)
Mobility Specialist: Progress Note   02/21/23 1607  Mobility  Activity Ambulated with assistance in hallway  Level of Assistance Standby assist, set-up cues, supervision of patient - no hands on  Assistive Device None  Distance Ambulated (ft) 750 ft  Activity Response Tolerated well  Mobility Referral Yes  $Mobility charge 1 Mobility   Pre-Mobility: 116 HR Post-Mobility: 128 HR  Pt received in the bed and agreeable to mobility. Mod I with bed mobility and standby assist during ambulation. Occasional cross-over gait during ambulation but no overt LOB. C/o mild HA at end of ambulation, otherwise asymptomatic. Pt sitting EOB after session with call bell at his side.   [name] Mobility Specialist Please contact via SecureChat or Rehab office at [phone]

## 2023-02-21 NOTE — Progress Notes (Signed)
Discharge instructions given to patient. Patient ready to go home

## 2023-02-21 NOTE — Progress Notes (Signed)
Physical Therapy Treatment Patient Details Name: Micheal Ayers MRN: [id number] DOB: February 09, 1961 Today's Date: 02/21/2023   History of Present Illness Pt is a 62 y.o. M who presents 02/18/2023 with slurred speech and difficulty in balance. MRI showing several small strokes as well as emergent ventricles. S/p LP 3/26. Significant PMH: none.    PT Comments    Pt has made excellent progress towards his physical therapy goals during his inpatient stay. Demonstrates an improvement from 19/24 to 22/24 on the Dynamic Gait Index and an increase from 22/24 to 23/24 on the AMPAC 6 clicks score. Pt ambulating hallway distances independently. Still continues with mild decreased shortened L step length, decreased LLE SLS time, and mild LLE circumduction during swing through phase. Overall decreased drift towards left when ambulating. Would recommend follow up OPPT to address. No further acute PT needs.    Recommendations for follow up therapy are one component of a multi-disciplinary discharge planning process, led by the attending physician.  Recommendations may be updated based on patient status, additional functional criteria and insurance authorization.  Assistance Recommended at Discharge PRN  Patient can return home with the following Assist for transportation   Equipment Recommendations  None recommended by PT    Recommendations for Other Services       Precautions / Restrictions Precautions Precautions: None Restrictions Weight Bearing Restrictions: No     Mobility  Bed Mobility Overal bed mobility: Independent                  Transfers Overall transfer level: Independent Equipment used: None                    Ambulation/Gait Ambulation/Gait assistance: Modified independent (Device/Increase time) Gait Distance (Feet): 350 Feet Assistive device: None Gait Pattern/deviations: Step-through pattern, Decreased stride length, Decreased step length - left Gait  velocity: 2.5 ft/s Gait velocity interpretation: 1.31 - 2.62 ft/sec, indicative of limited community ambulator   General Gait Details: Shortened L step length, but able to correct with cueing. Mild circumduction   Stairs Stairs: Yes Stairs assistance: Supervision Stair Management: No rails Number of Stairs: 10     Wheelchair Mobility    Modified Rankin (Stroke Patients Only) Modified Rankin (Stroke Patients Only) Pre-Morbid Rankin Score: No symptoms Modified Rankin: No significant disability     Balance Overall balance assessment: Needs assistance Sitting-balance support: Feet supported Sitting balance-Leahy Scale: Good     Standing balance support: No upper extremity supported, During functional activity Standing balance-Leahy Scale: Good   Single Leg Stance - Right Leg: 10 Single Leg Stance - Left Leg: 0 Tandem Stance - Right Leg: 10 Tandem Stance - Left Leg: 10 Rhomberg - Eyes Opened: 10       Standardized Balance Assessment Standardized Balance Assessment : Dynamic Gait Index   Dynamic Gait Index Level Surface: Mild Impairment Change in Gait Speed: Normal Gait with Horizontal Head Turns: Normal Gait with Vertical Head Turns: Normal Gait and Pivot Turn: Normal Step Over Obstacle: Normal Step Around Obstacles: Mild Impairment Steps: Normal Total Score: 22      Cognition Arousal/Alertness: Awake/alert Behavior During Therapy: WFL for tasks assessed/performed Overall Cognitive Status: Within Functional Limits for tasks assessed                                          Exercises      General Comments  Pertinent Vitals/Pain Pain Assessment Pain Assessment: No/denies pain    Home Living                          Prior Function            PT Goals (current goals can now be found in the care plan section) Acute Rehab PT Goals Patient Stated Goal: return to baseline Potential to Achieve Goals: Good Progress  towards PT goals: Goals met/education completed, patient discharged from PT    Frequency    Min 2X/week      PT Plan Current plan remains appropriate    Co-evaluation              AM-PAC PT "6 Clicks" Mobility   Outcome Measure  Help needed turning from your back to your side while in a flat bed without using bedrails?: None Help needed moving from lying on your back to sitting on the side of a flat bed without using bedrails?: None Help needed moving to and from a bed to a chair (including a wheelchair)?: None Help needed standing up from a chair using your arms (e.g., wheelchair or bedside chair)?: None Help needed to walk in hospital room?: None Help needed climbing 3-5 steps with a railing? : A Little 6 Click Score: 23    End of Session   Activity Tolerance: Patient tolerated treatment well Patient left: in bed;with call bell/phone within reach Nurse Communication: Mobility status PT Visit Diagnosis: Unsteadiness on feet (R26.81)     Time: [id number] PT Time Calculation (min) (ACUTE ONLY): 10 min  Charges:  $Therapeutic Activity: 8-22 mins                     Wyona Almas, PT, DPT Acute Rehabilitation Services Office [phone]    Deno Etienne 02/21/2023, 5:11 PM

## 2023-02-21 NOTE — Progress Notes (Signed)
Stroke education given to pt. Specifically, BEFAST and 911 activation discussed. Individual risk factors discussed.   [place] RN Stroke Response

## 2023-02-21 NOTE — Discharge Summary (Signed)
Physician Discharge Summary   Patient: Micheal Ayers MRN: [id number] DOB: June 04, 1961  Admit date:     02/18/2023  Discharge date: {dischdate:26783}  Discharge Physician: Annita Brod   PCP: Jonathon Jordan, MD   Recommendations at discharge:  {Tip this will not be part of the note when signed- Example include specific recommendations for outpatient follow-up, pending tests to follow-up on. (Optional):26781}  ***  Discharge Diagnoses: Principal Problem:   Ischemic cerebrovascular accident (CVA) ([place]) Active Problems:   Cerebral ventriculomegaly   Acute renal failure superimposed on stage 2 chronic kidney disease (HCC)   Hypertensive urgency   Abnormal CXR   CVA (cerebral vascular accident) ([place])  Resolved Problems:   * No resolved hospital problems. [facility] Course: PMH of ADD present to the hospital with complaints of slurred speech and difficulty with balance. 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  Neurology was consulted.  Currently concerned with regards to NPH and would recommend LP before discharge.  Assessment and Plan: No notes have been filed under this hospital service. Service: Hospitalist     {Tip this will not be part of the note when signed Body mass index is 33.86 kg/m. , ,  (Optional):26781}  {(NOTE) Pain control PDMP Statment (Optional):26782} Consultants: *** Procedures performed: ***  Disposition: {Plan; Disposition:26390} Diet recommendation:  Discharge Diet Orders (From admission, onward)     Start     Ordered   02/21/23 0000  Diet - low sodium heart healthy        02/21/23 1745           {Diet_Plan:26776} DISCHARGE MEDICATION: Allergies as of 02/21/2023       Reactions   Erythromycin Hives        Medication List     STOP taking these medications    meloxicam 15 MG tablet Commonly known as: MOBIC       TAKE these medications    amoxicillin-clavulanate 875-125 MG tablet Commonly known as:  AUGMENTIN Take 1 tablet by mouth every 12 (twelve) hours for 7 days.   aspirin EC 81 MG tablet Take 1 tablet (81 mg total) by mouth daily. Swallow whole. Start taking on: February 22, 2023   atomoxetine 60 MG capsule Commonly known as: STRATTERA Take 1 capsule (60 mg total) by mouth daily.   clopidogrel 75 MG tablet Commonly known as: Plavix Take 1 tablet (75 mg total) by mouth daily for 21 days.   doxycycline 100 MG capsule Commonly known as: VIBRAMYCIN Take 1 capsule (100 mg total) by mouth 2 (two) times daily for 7 days.   rosuvastatin 10 MG tablet Commonly known as: CRESTOR Take 1 tablet (10 mg total) by mouth daily. Start taking on: February 22, 2023   valsartan-hydrochlorothiazide 80-12.5 MG tablet Commonly known as: DIOVAN-HCT Take 1 tablet by mouth daily.   VITAMIN B12 PO Take 1 tablet by mouth daily.   VITAMIN D PO Take 1 tablet by mouth daily.        Follow-up Information     [place] .   Why: ED for MRI, reevaluation Contact information: [address] [place] SSN-[id number] [phone]        Jonathon Jordan, MD .   Specialty: Family Medicine Why: recommend XR chest in 3-4 weeks to evaluate for resolution of possible infection on chest XR Contact information: [address] [place] [address] [place]  [facility]. Schedule an appointment as soon as possible for a visit in 1 week(s).   Specialty: Rehabilitation Contact information: [place], [place] [id number] [place] [phone]               Discharge Exam: Filed Weights   02/18/23 1447 02/19/23 0411  Weight: 107 kg 107 kg   ***  Condition at discharge: {DC Condition:26389}  The results of significant diagnostics from this hospitalization (including imaging, microbiology, ancillary and laboratory) are listed below for  reference.   Imaging Studies: DG FL GUIDED LUMBAR PUNCTURE  Result Date: 02/20/2023 CLINICAL DATA:  Ventriculomegaly on brain imaging. 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 Request for diagnostic lumbar puncture. EXAM: DIAGNOSTIC LUMBAR PUNCTURE UNDER FLUOROSCOPIC GUIDANCE COMPARISON:  Head CT and MRI 02/18/2023.  Lumbar MRI 06/09/2018. FLUOROSCOPY: Radiation Exposure Index (as provided by the fluoroscopic device): 4.4 mGy Kerma PROCEDURE: Informed consent was obtained from the patient prior to the procedure, including potential complications of headache, allergy, and pain. With the patient prone, the lower back was prepped with Betadine. 1% Lidocaine was used for local anesthesia. Lumbar puncture was performed at the L2-L3 level using a 20 gauge needle with return of clear, colorless CSF with an opening pressure of 15 cm water. A total of approximately 32 ml of CSF were obtained, of which 30 mL were sent for laboratory studies. Closing pressure of 13 cm of water. The patient tolerated the procedure well and there were no apparent complications. IMPRESSION: Technically successful lumbar puncture at the L2-L3 level as above. Procedure performed by Ascencion Dike PA-C and supervised by Dr. Richardean Sale Electronically Signed   By: Richardean Sale M.D.   On: 02/20/2023 17:04   ECHOCARDIOGRAM COMPLETE  Result Date: 02/19/2023    ECHOCARDIOGRAM REPORT   Patient Name:   Micheal Ayers [facility] Date of Exam: 02/19/2023 Medical Rec #:  [id number]        Height:       70.0 in Accession #:    [id number]       Weight:       236.0 lb Date of Birth:  [birth year]        BSA:          2.239 m Patient Age:    3 years         BP:           134/92 mmHg Patient Gender: M                HR:           74 bpm. Exam Location:  Inpatient Procedure: 2D Echo, Cardiac Doppler and Color Doppler Indications:    Stroke  History:        Patient has no prior history of Echocardiogram examinations.                 Stroke,  Signs/Symptoms:Fatigue; Risk Factors:Hypertension.  Sonographer:    Wenda Low Referring Phys: [id number] Posey  1. Left ventricular ejection fraction, by estimation, is 60 to 65%. The left ventricle has normal function. The left ventricle has no regional wall motion abnormalities. Left ventricular diastolic parameters were normal.  2. Right ventricular systolic function is normal. The right ventricular size is normal. There is normal pulmonary artery systolic pressure.  3. The mitral valve is normal in structure. No evidence of mitral valve regurgitation. No evidence of mitral stenosis.  4. The aortic valve is normal in structure.  Aortic valve regurgitation is not visualized. No aortic stenosis is present.  5. The inferior vena cava is normal in size with greater than 50% respiratory variability, suggesting right atrial pressure of 3 mmHg. FINDINGS  Left Ventricle: Left ventricular ejection fraction, by estimation, is 60 to 65%. The left ventricle has normal function. The left ventricle has no regional wall motion abnormalities. The left ventricular internal cavity size was normal in size. There is  no left ventricular hypertrophy. Left ventricular diastolic parameters were normal. Right Ventricle: The right ventricular size is normal. No increase in right ventricular wall thickness. Right ventricular systolic function is normal. There is normal pulmonary artery systolic pressure. The tricuspid regurgitant velocity is 1.68 m/s, and  with an assumed right atrial pressure of 8 mmHg, the estimated right ventricular systolic pressure is 123456 mmHg. Left Atrium: Left atrial size was normal in size. Right Atrium: Right atrial size was normal in size. Pericardium: There is no evidence of pericardial effusion. Mitral Valve: The mitral valve is normal in structure. No evidence of mitral valve regurgitation. No evidence of mitral valve stenosis. MV peak gradient, 4.6 mmHg. The mean mitral valve  gradient is 1.0 mmHg. Tricuspid Valve: The tricuspid valve is normal in structure. Tricuspid valve regurgitation is not demonstrated. No evidence of tricuspid stenosis. Aortic Valve: The aortic valve is normal in structure. Aortic valve regurgitation is not visualized. No aortic stenosis is present. Aortic valve mean gradient measures 3.0 mmHg. Aortic valve peak gradient measures 5.7 mmHg. Aortic valve area, by VTI measures 2.91 cm. Pulmonic Valve: The pulmonic valve was normal in structure. Pulmonic valve regurgitation is trivial. No evidence of pulmonic stenosis. Aorta: The aortic root is normal in size and structure. Venous: The inferior vena cava is normal in size with greater than 50% respiratory variability, suggesting right atrial pressure of 3 mmHg. IAS/Shunts: No atrial level shunt detected by color flow Doppler.  LEFT VENTRICLE PLAX 2D LVIDd:         4.60 cm   Diastology LVIDs:         2.70 cm   LV e' medial:    4.79 cm/s LV PW:         1.50 cm   LV E/e' medial:  11.1 LV IVS:        1.30 cm   LV e' lateral:   9.68 cm/s LVOT diam:     2.10 cm   LV E/e' lateral: 5.5 LV SV:         75 LV SV Index:   33 LVOT Area:     3.46 cm  RIGHT VENTRICLE RV Basal diam:  3.80 cm RV Mid diam:    3.80 cm RV S prime:     13.30 cm/s TAPSE (M-mode): 2.6 cm LEFT ATRIUM             Index        RIGHT ATRIUM           Index LA diam:        3.80 cm 1.70 cm/m   RA Area:     19.90 cm LA Vol (A2C):   53.5 ml 23.89 ml/m  RA Volume:   58.50 ml  26.12 ml/m LA Vol (A4C):   60.0 ml 26.79 ml/m LA Biplane Vol: 58.1 ml 25.94 ml/m  AORTIC VALVE                    PULMONIC VALVE AV Area (Vmax):    3.11 cm  PV Vmax:       0.67 m/s AV Area (Vmean):   3.02 cm     PV Peak grad:  1.8 mmHg AV Area (VTI):     2.91 cm AV Vmax:           119.00 cm/s AV Vmean:          75.400 cm/s AV VTI:            0.257 m AV Peak Grad:      5.7 mmHg AV Mean Grad:      3.0 mmHg LVOT Vmax:         107.00 cm/s LVOT Vmean:        65.800 cm/s LVOT VTI:           0.216 m LVOT/AV VTI ratio: 0.84  AORTA Ao Root diam: 4.00 cm Ao Asc diam:  3.50 cm MITRAL VALVE               TRICUSPID VALVE MV Area (PHT): 3.10 cm    TR Peak grad:   11.3 mmHg MV Area VTI:   3.98 cm    TR Vmax:        168.00 cm/s MV Peak grad:  4.6 mmHg MV Mean grad:  1.0 mmHg    SHUNTS MV Vmax:       1.07 m/s    Systemic VTI:  0.22 m MV Vmean:      45.8 cm/s   Systemic Diam: 2.10 cm MV Decel Time: 245 msec MV E velocity: 53.10 cm/s MV A velocity: 91.70 cm/s MV E/A ratio:  0.58 Aditya Sabharwal Electronically signed by Hebert Soho Signature Date/Time: 02/19/2023/3:32:52 PM    Final    MR ANGIO HEAD WO CONTRAST  Result Date: 02/19/2023 CLINICAL DATA:  Neuro deficit, acute, stroke suspected; Stroke/TIA, determine embolic source. EXAM: MRA HEAD WITHOUT CONTRAST MRA NECK WITHOUT CONTRAST TECHNIQUE: Multiplanar, multiecho pulse sequences of the brain and surrounding structures were obtained without intravenous contrast. Angiographic images of the Circle of Willis were obtained using MRA technique without intravenous contrast. Angiographic images of the neck were obtained using MRA technique without intravenous contrast. Carotid stenosis measurements (when applicable) are obtained utilizing NASCET criteria, using the distal internal carotid diameter as the denominator. COMPARISON:  None Available. FINDINGS: MRA HEAD FINDINGS The study is mildly motion degraded. The intracranial vertebral arteries are widely patent to the basilar. Patent PICA and SCA origins are seen bilaterally. The basilar artery is widely patent. The basilar artery is widely patent. Posterior communicating arteries are diminutive or absent. Both PCAs are patent proximally without evidence of a flow limiting proximal stenosis. The internal carotid arteries are patent from skull base to carotid termini without evidence of a significant stenosis. 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 ACAs and MCAs are patent without evidence of a significant A1 or M1 stenosis. Detailed branch vessel evaluation is limited by motion. No aneurysm is identified. MRA NECK FINDINGS Assessment is limited by noncontrast technique and mild motion. The origins of the common carotid arteries are not well evaluated. The remainder of the common carotid arteries as well as the cervical internal carotid arteries are patent without evidence of a significant stenosis. The vertebral arteries are patent and codominant with antegrade flow bilaterally. Portions of the V1 and V3 segments are not well evaluated. No flow limiting vertebral artery stenosis is evident elsewhere. IMPRESSION: 1. Motion degraded head  MRA without evidence of a large vessel occlusion or flow limiting proximal stenosis. 2. Patent cervical carotid and vertebral arteries without evidence of a flow limiting stenosis within the above described study limitations. Electronically Signed   By: Logan Bores M.D.   On: 02/19/2023 11:33   MR ANGIO NECK WO CONTRAST  Result Date: 02/19/2023 CLINICAL DATA:  Neuro deficit, acute, stroke suspected; Stroke/TIA, determine embolic source. EXAM: MRA HEAD WITHOUT CONTRAST MRA NECK WITHOUT CONTRAST TECHNIQUE: Multiplanar, multiecho pulse sequences of the brain and surrounding structures were obtained without intravenous contrast. Angiographic images of the Circle of Willis were obtained using MRA technique without intravenous contrast. Angiographic images of the neck were obtained using MRA technique without intravenous contrast. Carotid stenosis measurements (when applicable) are obtained utilizing NASCET criteria, using the distal internal carotid diameter as the denominator. COMPARISON:  None Available. FINDINGS: MRA HEAD FINDINGS The study is mildly motion degraded. The intracranial vertebral arteries are widely patent to the basilar. Patent PICA and SCA origins are seen  bilaterally. The basilar artery is widely patent. The basilar artery is widely patent. Posterior communicating arteries are diminutive or absent. Both PCAs are patent proximally without evidence of a flow limiting proximal stenosis. The internal carotid arteries are patent from skull base to carotid termini without evidence of a significant stenosis. 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 ACAs and MCAs are patent without evidence of a significant A1 or M1 stenosis. Detailed branch vessel evaluation is limited by motion. No aneurysm is identified. MRA NECK FINDINGS Assessment is limited by noncontrast technique and mild motion. The origins of the common carotid arteries are not well evaluated. The remainder of the common carotid arteries as well as the cervical internal carotid arteries are patent without evidence of a significant stenosis. The vertebral arteries are patent and codominant with antegrade flow bilaterally. Portions of the V1 and V3 segments are not well evaluated. No flow limiting vertebral artery stenosis is evident elsewhere. IMPRESSION: 1. Motion degraded head MRA without evidence of a large vessel occlusion or flow limiting proximal stenosis. 2. Patent cervical carotid and vertebral arteries without evidence of a flow limiting stenosis within the above described study limitations. Electronically Signed   By: Logan Bores M.D.   On: 02/19/2023 11:33   MR BRAIN WO CONTRAST  Result Date: 02/18/2023 CLINICAL DATA:  Initial evaluation for neuro deficit, stroke suspected. EXAM: MRI HEAD WITHOUT CONTRAST TECHNIQUE: Multiplanar, multiecho pulse sequences of the brain and surrounding structures were obtained without intravenous contrast. COMPARISON:  Prior CT from earlier the same day. FINDINGS: Brain: Cerebral volume within normal limits. Scattered patchy T2/FLAIR hyperintensity involving the  periventricular deep white matter both cerebral hemispheres, most consistent with chronic small vessel ischemic disease, mild for age. Small remote left cerebellar infarct. 7 mm focus of diffusion signal abnormality seen involving the subcortical white matter of the anterior right frontal corona radiata (series 2, image 44). In additional 8 mm focus of diffusion signal abnormality seen involving the right thalamus (series 2, image 31). Subtle associated signal loss seen at these locations on corresponding ADC map (series 250, images 44, 31 respectively. Findings consistent with small acute to early subacute small vessel infarcts. No associated hemorrhage or mass effect. No other evidence for acute or subacute ischemia. No areas of chronic cortical infarction. No acute or chronic intracranial blood products. No mass lesion, mass effect or, or midline shift. Marked lateral and  third ventriculomegaly, with comparatively normal size of the fourth ventricle. Changes are somewhat out of portion to overlying cortical sulcation. No findings to suggest transependymal flow of CSF. No extra-axial collection. Pituitary gland and suprasellar region within normal limits. Vascular: Major intracranial vascular flow voids are maintained. Skull and upper cervical spine: Craniocervical junction within normal limits. Bone marrow signal intensity normal. No scalp soft tissue abnormality. Sinuses/Orbits: Globes orbital soft tissues demonstrate no acute finding. Tiny subcentimeter T2 hypointense focus noted along the posteromedial aspect of the left globe on T2 weighted sequence (series 5, image 13), not well seen on corresponding sequences, and favored to be related to an underlying vessel. Mild mucosal thickening noted about the left maxillary sinus. Paranasal sinuses are otherwise clear. Trace left mastoid effusion, of doubtful significance. Other: None. IMPRESSION: 1. Small acute to early subacute small vessel infarcts involving the  subcortical white matter of the anterior right frontal corona radiata and right thalamus. No associated hemorrhage or mass effect. 2. Marked lateral and third ventriculomegaly, somewhat out of proportion to overlying cortical sulcation. Clinical correlation for possible in pH is advised. Possible underlying cerebral aqueductal stenosis would be the primary differential consideration. No significant transependymal flow of CSF, suggesting at this is a chronic and compensated finding. 3. Underlying mild chronic microvascular ischemic disease. Small remote left cerebellar infarct. Electronically Signed   By: Jeannine Boga M.D.   On: 02/18/2023 19:49   CT Head Wo Contrast  Result Date: 02/18/2023 CLINICAL DATA:  Neuro deficit, increased fatigue and weakness EXAM: CT HEAD WITHOUT CONTRAST TECHNIQUE: Contiguous axial images were obtained from the base of the skull through the vertex without intravenous contrast. RADIATION DOSE REDUCTION: This exam was performed according to the departmental dose-optimization program which includes automated exposure control, adjustment of the mA and/or kV according to patient size and/or use of iterative reconstruction technique. COMPARISON:  None Available. FINDINGS: Brain: Ventriculomegaly, without acute callosal angle or sulcal crowding at the vertex, favored to represent ex vacuo dilatation in the setting of diffuse cerebral volume loss, which is most prominent in the frontal and parietal lobes, with less significant atrophy in the temporal and right occipital lobe. No evidence of acute infarction, hemorrhage, mass, mass effect, or midline shift. Sequela of remote infarct in the inferior left cerebellum. No extra-axial fluid collection. Partial empty sella. Normal craniocervical junction. Vascular: No hyperdense vessel. Skull: Negative for fracture or focal lesion. Sinuses/Orbits: No acute finding. Other: The mastoid air cells are well aerated. IMPRESSION: 1. No acute  intracranial process. 2. Ventriculomegaly, favored to represent ex vacuo dilatation in the setting of diffuse cerebral volume loss, which is most prominent in the frontal and parietal lobes, with less significant atrophy in the temporal and right occipital lobe. Electronically Signed   By: Merilyn Baba M.D.   On: 02/18/2023 16:47   DG Chest 2 View  Result Date: 02/18/2023 CLINICAL DATA:  Fatigue. EXAM: CHEST - 2 VIEW COMPARISON:  December 11, 2004. FINDINGS: The heart size and mediastinal contours are within normal limits. Right lung is clear. Left lingular opacity is noted concerning for pneumonia or atelectasis. Mild compression deformity of a midthoracic vertebral body is noted most consistent with old fracture. IMPRESSION: Left lingular opacity is noted most consistent with pneumonia or atelectasis. Followup PA and lateral chest X-ray is recommended in 3-4 weeks following trial of antibiotic therapy to ensure resolution and exclude underlying malignancy. Electronically Signed   By: Marijo Conception M.D.   On: 02/18/2023 16:21    Microbiology:  Results for orders placed or performed during the hospital encounter of 02/18/23  Resp panel by RT-PCR (RSV, Flu A&B, Covid) Anterior Nasal Swab     Status: None   Collection Time: 02/18/23  3:05 PM   Specimen: Anterior Nasal Swab  Result Value Ref Range Status   SARS Coronavirus 2 by RT PCR NEGATIVE NEGATIVE Final    Comment: (NOTE) SARS-CoV-2 target nucleic acids are NOT DETECTED.  The SARS-CoV-2 RNA is generally detectable in upper respiratory specimens during the acute phase of infection. The lowest concentration of SARS-CoV-2 viral copies this assay can detect is 138 copies/mL. A negative result does not preclude SARS-Cov-2 infection and should not be used as the sole basis for treatment or other patient management decisions. A negative result may occur with  improper specimen collection/handling, submission of specimen other than nasopharyngeal  swab, presence of viral mutation(s) within the areas targeted by this assay, and inadequate number of viral copies(<138 copies/mL). A negative result must be combined with clinical observations, patient history, and epidemiological information. The expected result is Negative.  Fact Sheet for Patients:  EntrepreneurPulse.com.au  Fact Sheet for Healthcare Providers:  IncredibleEmployment.be  This test is no t yet approved or cleared by the [place] FDA and  has been authorized for detection and/or diagnosis of SARS-CoV-2 by FDA under an Emergency Use Authorization (EUA). This EUA will remain  in effect (meaning this test can be used) for the duration of the COVID-19 declaration under Section 564(b)(1) of the Act, 21 U.S.C.section 360bbb-3(b)(1), unless the authorization is terminated  or revoked sooner.       Influenza A by PCR NEGATIVE NEGATIVE Final   Influenza B by PCR NEGATIVE NEGATIVE Final    Comment: (NOTE) The Xpert Xpress SARS-CoV-2/FLU/RSV plus assay is intended as an aid in the diagnosis of influenza from Nasopharyngeal swab specimens and should not be used as a sole basis for treatment. Nasal washings and aspirates are unacceptable for Xpert Xpress SARS-CoV-2/FLU/RSV testing.  Fact Sheet for Patients: EntrepreneurPulse.com.au  Fact Sheet for Healthcare Providers: IncredibleEmployment.be  This test is not yet approved or cleared by the [place] FDA and has been authorized for detection and/or diagnosis of SARS-CoV-2 by FDA under an Emergency Use Authorization (EUA). This EUA will remain in effect (meaning this test can be used) for the duration of the COVID-19 declaration under Section 564(b)(1) of the Act, 21 U.S.C. section 360bbb-3(b)(1), unless the authorization is terminated or revoked.     Resp Syncytial Virus by PCR NEGATIVE NEGATIVE Final    Comment: (NOTE) Fact Sheet for  Patients: EntrepreneurPulse.com.au  Fact Sheet for Healthcare Providers: IncredibleEmployment.be  This test is not yet approved or cleared by the [place] FDA and has been authorized for detection and/or diagnosis of SARS-CoV-2 by FDA under an Emergency Use Authorization (EUA). This EUA will remain in effect (meaning this test can be used) for the duration of the COVID-19 declaration under Section 564(b)(1) of the Act, 21 U.S.C. section 360bbb-3(b)(1), unless the authorization is terminated or revoked.  Performed at [facility], [address], [place], [place] [address]   CSF culture w Gram Stain     Status: None (Preliminary result)   Collection Time: 02/20/23  4:30 PM   Specimen: PATH Cytology CSF; Cerebrospinal Fluid  Result Value Ref Range Status   Specimen Description CSF  Final   Special Requests NONE  Final   Gram Stain NO WBC SEEN NO ORGANISMS SEEN CYTOSPIN SMEAR   Final   Culture  Final    NO GROWTH < 24 HOURS Performed at [facility], [place] [address] [address] [place], [place] [address]    Report Status PENDING  Incomplete    Labs: CBC: Recent Labs  Lab 02/18/23 1528 02/20/23 0717 02/21/23 0358  WBC 9.8 6.2 7.9  NEUTROABS 6.1  --   --   HGB 16.7 17.1* 17.1*  HCT 48.5 49.8 49.8  MCV 86.9 88.0 86.9  PLT 255 219 [id number]   Basic Metabolic Panel: Recent Labs  Lab 02/18/23 1528 02/19/23 1230 02/20/23 0717 02/21/23 0358  NA 137 138 136 137  K 3.7 3.9 4.0 3.7  CL 102 104 101 101  CO2 28 25 26 26   GLUCOSE 144* 107* 107* 119*  BUN 29* 20 18 16   CREATININE 1.41* 1.00 0.99 0.99  CALCIUM 10.1 9.2 9.2 9.4  MG  --   --  1.9 1.7   Liver Function Tests: Recent Labs  Lab 02/18/23 1528  AST 14*  ALT 16  ALKPHOS 65  BILITOT 0.6  PROT 7.4  ALBUMIN 4.4   CBG: No results for input(s): "GLUCAP" in the last 168 hours.  Discharge time spent: {LESS THAN/GREATER DI:[id number] 30  minutes.  Signed: Annita Brod, MD [facility] 02/21/2023

## 2023-02-21 NOTE — Progress Notes (Signed)
STROKE TEAM PROGRESS NOTE   INTERVAL HISTORY Patient lying in bed in no acute distress.   He had spinal tap done yesterday by interventional radiology and opening pressure was only 14.  34 cc were removed.  Patient feels subjectively may be feeling little better with physical therapy and not yet walking look for objective improvement. Spinal fluid shows slightly elevated protein of 60.  Glucose and cell count is normal Vitals:   02/20/23 2307 02/21/23 0403 02/21/23 0430 02/21/23 1221  BP: (!) 134/90 (!) 153/103 127/83 (!) 144/105  Pulse: 79 78  83  Resp: 18 18  18   Temp: 98 F (36.7 C) 97.9 F (36.6 C)  98.1 F (36.7 C)  TempSrc: Oral Oral  Oral  SpO2: 95% 96%  97%  Weight:      Height:       CBC:  Recent Labs  Lab 02/18/23 1528 02/20/23 0717 02/21/23 0358  WBC 9.8 6.2 7.9  NEUTROABS 6.1  --   --   HGB 16.7 17.1* 17.1*  HCT 48.5 49.8 49.8  MCV 86.9 88.0 86.9  PLT 255 219 [id number]   Basic Metabolic Panel:  Recent Labs  Lab 02/20/23 0717 02/21/23 0358  NA 136 137  K 4.0 3.7  CL 101 101  CO2 26 26  GLUCOSE 107* 119*  BUN 18 16  CREATININE 0.99 0.99  CALCIUM 9.2 9.4  MG 1.9 1.7   Lipid Panel:  Recent Labs  Lab 02/19/23 0605  CHOL 176  TRIG 107  HDL 37*  CHOLHDL 4.8  VLDL 21  LDLCALC 118*   HgbA1c:  Recent Labs  Lab 02/19/23 0605  HGBA1C 6.0*   Urine Drug Screen: No results for input(s): "LABOPIA", "COCAINSCRNUR", "LABBENZ", "AMPHETMU", "THCU", "LABBARB" in the last 168 hours.  Alcohol Level No results for input(s): "ETH" in the last 168 hours.  IMAGING past 24 hours DG FL GUIDED LUMBAR PUNCTURE  Result Date: 02/20/2023 CLINICAL DATA:  Ventriculomegaly on brain imaging. 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 Request for diagnostic lumbar puncture. EXAM: DIAGNOSTIC LUMBAR PUNCTURE UNDER FLUOROSCOPIC GUIDANCE COMPARISON:  Head CT and MRI 02/18/2023.  Lumbar MRI 06/09/2018. FLUOROSCOPY: Radiation Exposure Index (as provided by the fluoroscopic device): 4.4  mGy Kerma PROCEDURE: Informed consent was obtained from the patient prior to the procedure, including potential complications of headache, allergy, and pain. With the patient prone, the lower back was prepped with Betadine. 1% Lidocaine was used for local anesthesia. Lumbar puncture was performed at the L2-L3 level using a 20 gauge needle with return of clear, colorless CSF with an opening pressure of 15 cm water. A total of approximately 32 ml of CSF were obtained, of which 30 mL were sent for laboratory studies. Closing pressure of 13 cm of water. The patient tolerated the procedure well and there were no apparent complications. IMPRESSION: Technically successful lumbar puncture at the L2-L3 level as above. Procedure performed by Ascencion Dike PA-C and supervised by Dr. Richardean Sale Electronically Signed   By: Richardean Sale M.D.   On: 02/20/2023 17:04    PHYSICAL EXAM  Temp:  [97.9 F (36.6 C)-98.4 F (36.9 C)] 98.1 F (36.7 C) (03/27 1221) Pulse Rate:  [78-92] 83 (03/27 1221) Resp:  [16-18] 18 (03/27 1221) BP: (117-153)/(83-105) 144/105 (03/27 1221) SpO2:  [93 %-97 %] 97 % (03/27 1221)  Physical Exam  Appears well-developed and well-nourished.  Middle-aged Caucasian male   Neuro: Mental Status: Patient is awake, alert, oriented to person, place, month, year, and situation. Patient is able  to give a clear and coherent history. No signs of aphasia or neglect.  Able to name previous 5 presidents/multiple animals when requested, able to correctly add, remembered 2 out of 3 words when asked after multiple minutes of conversation Cranial Nerves: II: Visual Fields are full. Pupils are equal, round, and reactive to light.   III,IV, VI: EOMI without ptosis or diploplia.  V: Facial sensation is symmetric to temperature VII: Facial movement is symmetric.  VIII: hearing is intact to voice X: Uvula elevates symmetrically XI: Shoulder shrug is symmetric. XII: tongue is midline without atrophy  or fasciculations.  Motor: Tone is normal. Bulk is normal. 5/5 strength was present in all four extremities.  Sensory: Sensation is symmetric to light touch and temperature in the arms and legs. Cerebellar: FNF intact bilaterally   ASSESSMENT/PLAN Micheal Ayers is a 62 y.o. male with history of ADD, low testosterone presenting with balance issues x 2 weeks without any focal weakness noted.  Wife also stated that he was slurring words over the past week.  MRI revealed several small strokes, along with hydrocephalus. Symptoms have improved.   Multiple small acute strokes, likely due to concurrent small vessel disease.  Concurrent with  ? normal pressure hydrocephalus  CT head:  No acute intracranial process. Ventriculomegaly, favored to represent ex vacuo dilatation in the setting of diffuse cerebral volume loss, which is most prominent in the frontal and parietal lobes, with less significant atrophy in the temporal and right occipital lobe. MRI  Small acute to early subacute small vessel infarcts involving the subcortical white matter of the anterior right frontal corona radiata and right thalamus. No associated hemorrhage or mass effect. Marked lateral and third ventriculomegaly, somewhat out of proportion to overlying cortical sulcation. Clinical correlation for possible in pH is advised. Possible underlying cerebral aqueductal stenosis would be the primary differential consideration. No significant transependymal flow of CSF, suggesting at this is a chronic and compensated finding. Underlying mild chronic microvascular ischemic disease. Small remote left cerebellar infarct MRA:  Motion degraded head MRA without evidence of a large vessel occlusion or flow limiting proximal stenosis. Patent cervical carotid and vertebral arteries without evidence of a flow limiting stenosis within the above described study limitations  2D Echo: EF 60 to 65%, no LVH, normal pulmonary artery  systolic pressure no MVR, no TVR, no AVR trivial pulmonic valve regurgitation.  LDL 118 HgbA1c No results found for requested labs within last 1095 days. VTE prophylaxis - lovenox    Diet   Diet regular Room service appropriate? No; Fluid consistency: Thin   aspirin 81 mg daily prior to admission, now on aspirin. Recommend aspirin and Plavix on discharge x 3 weeks then Plavix alone. PLAVIX on HOLD due to pending inpatient LP procedure Therapy recommendations:  Home with assistance Disposition:  home (lives with wife)  Normal-Pressure Hydrocephalus MRI  Marked lateral and third ventriculomegaly, somewhat out of proportion to overlying cortical sulcation. Clinical correlation for possible in pH is advised. Possible underlying cerebral aqueductal stenosis would be the primary differential consideration. No significant transependymal flow of CSF, suggesting at this is a chronic and compensated finding. Bedside LP unsuccessful.  Pending fluoroscopy guided LP showed elevated opening pressure of only 14.  CSF cell count is normal..  Monitor for increased NPH symptoms (balance issues, mood changes, forgetfulness/confusion) SAT CT with any acute worsening in mental status  Hypertension Home meds:  Diovan Permissive hypertension (OK if < 220/120) but gradually normalize in 5-7 days Long-term BP goal  normotensive  Hyperlipidemia Home meds:  none LDL 118, goal < 70 Add Crestor 10mg   Continue statin at discharge  Other Stroke Risk Factors Former Cigarette smoker Obesity, Body mass index is 33.86 kg/m., BMI >/= 30 associated with increased stroke risk, recommend weight loss, diet and exercise as appropriate  Family hx stroke (grandparent)  Other Active Problems 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  He does have mild cognitive difficulties and some balance problems of late symptoms for normal pressure  hydrocephalus are soft and borderline and spinal tap showed only opening pressure of 14.  Patient may be referred for outpatient consultation with neurosurgery to relax the classical clinical triad but brain imaging shows significant ventriculomegaly..  Stroke team will sign off.  Discussed with Dr. Maryland Pink..  Greater than 50% time during this 35-minute visit was spent on counseling and coordination of care about his lacunar strokes and normal pressure hydrocephalus and answering questions.  Antony Contras, MD Medical Director [facility] Pager: [phone] 02/21/2023 12:39 PM

## 2023-02-22 ENCOUNTER — Other Ambulatory Visit (HOSPITAL_COMMUNITY): Payer: Self-pay

## 2023-02-24 LAB — CSF CULTURE W GRAM STAIN
Culture: NO GROWTH
Gram Stain: NONE SEEN

## 2023-02-27 ENCOUNTER — Ambulatory Visit: Payer: 59

## 2023-02-27 ENCOUNTER — Encounter: Payer: Self-pay | Admitting: Physical Therapy

## 2023-02-27 ENCOUNTER — Ambulatory Visit: Payer: 59 | Attending: Hematology and Oncology | Admitting: Physical Therapy

## 2023-02-27 ENCOUNTER — Other Ambulatory Visit: Payer: Self-pay

## 2023-02-27 DIAGNOSIS — R41841 Cognitive communication deficit: Secondary | ICD-10-CM | POA: Diagnosis present

## 2023-02-27 DIAGNOSIS — I639 Cerebral infarction, unspecified: Secondary | ICD-10-CM | POA: Insufficient documentation

## 2023-02-27 DIAGNOSIS — R2689 Other abnormalities of gait and mobility: Secondary | ICD-10-CM | POA: Diagnosis present

## 2023-02-27 DIAGNOSIS — M6281 Muscle weakness (generalized): Secondary | ICD-10-CM

## 2023-02-27 DIAGNOSIS — R2681 Unsteadiness on feet: Secondary | ICD-10-CM

## 2023-02-27 NOTE — Therapy (Signed)
OUTPATIENT SPEECH LANGUAGE PATHOLOGY EVALUATION   Patient Name: Micheal Ayers MRN: [id number] DOB:01-15-1961, 62 y.o., male Today's Date: 02/27/2023  PCP: Jonathon Jordan, MD REFERRING PROVIDER: Lavina Hamman, MD Jonathon Jordan, MD (documentation))  END OF SESSION:  End of Session - 02/27/23 2312     Visit Number 1    Number of Visits 9    Date for SLP Re-Evaluation 05/05/23    SLP Start Time 1021    SLP Stop Time  1102    SLP Time Calculation (min) 41 min    Activity Tolerance Patient tolerated treatment well             Past Medical History:  Diagnosis Date   ADD (attention deficit disorder)    Allergic rhinitis, cause unspecified    Past Surgical History:  Procedure Laterality Date   elbow surgury  1969   left elbow   INGUINAL HERNIA REPAIR  2003   left   Patient Active Problem List   Diagnosis Date Noted   Ischemic cerebrovascular accident (CVA) 02/19/2023   Cerebral ventriculomegaly 02/19/2023   Acute renal failure superimposed on stage 2 chronic kidney disease 02/19/2023   Hypertensive urgency 02/19/2023   Abnormal CXR 02/19/2023   CVA (cerebral vascular accident) 02/19/2023   Fatigue 09/20/2012   Family history of hemochromatosis 09/20/2012   Preventative health care 09/06/2012   ADD (attention deficit disorder)    Allergic rhinitis, cause unspecified     ONSET DATE: 02/18/23   REFERRING DIAG:  Diagnosis  I63.9 (ICD-10-CM) - Acute CVA     THERAPY DIAG:  Cognitive communication deficit  Rationale for Evaluation and Treatment: Rehabilitation  SUBJECTIVE:   SUBJECTIVE STATEMENT: "My memory wasn't great before but it's worse now." (Pt with premorbid dx of ADD) Pt accompanied by: self  PERTINENT HISTORY: HPI: Pt is a 62 y.o. M who presented 02/18/23 with slurred speech and balance difficulty. MRI brian 3/24: Small acute to early subacute small vessel infarcts involving the subcortical white matter of the anterior right frontal corona  radiata and right thalamus. Pt has appointment to discuss possible shunt placement for presence of NPH, next week. PMH: ADD.  PAIN:  Are you having pain? No  FALLS: Has patient fallen in last 6 months?  See PT evaluation for details  LIVING ENVIRONMENT: Lives with:their spouse Lives in: House/apartment  PLOF:  Level of assistance: Independent with ADLs, Independent with IADLs, Needed assistance with IADLS (calendar, meds) Employment: Full-time employment  PATIENT GOALS: Improve memory  OBJECTIVE:   DIAGNOSTIC FINDINGS:  MR without contrast 02/18/23: IMPRESSION: 1. Small acute to early subacute small vessel infarcts involving the subcortical white matter of the anterior right frontal corona radiata and right thalamus. No associated hemorrhage or mass effect. 2. Marked lateral and third ventriculomegaly, somewhat out of proportion to overlying cortical sulcation. Clinical correlation for possible in pH is advised. Possible underlying cerebral aqueductal stenosis would be the primary differential consideration. No significant transependymal flow of CSF, suggesting at this is a chronic and compensated finding. 3. Underlying mild chronic microvascular ischemic disease. Small remote left cerebellar infarct.  Speech-Language Eval 02-19-23: Pt participated in speech-language-cognition evaluation. 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 He reported that he is on medication for ADD, but still has some difficulty attending at baseline. Pt reported that he has recently noticed that he has been having difficulty counting money at work and that he has been forgetting  whether he has completed tasks. He reported that his speech is now "slurred" and that it is now approximately 75% back to baseline. [facility] Mental Status Examination was completed to evaluate the pt's cognitive-linguistic  skills. He achieved a score of 28/30 which is within the normal limits of 27 or more out of 30. However, pt stated that he believes his cognition and processing speed are slower. Some difficulty was noted with memory and tasks which required executive functions; self-correction and additional processing time were often necessary for accuracy during these tasks. Pt also presented with mild dysarthria characterized by reduced articulatory precision and a reduced ability to adequately coordinate respiration with speech at the conversational level. Speech intelligibility was occasionally impacted during conversation. Pt became tearful towards the end of the evaluation and expressed that he was having difficulty emotionally processing everything; pt verbalized agreement with spiritual care consult and Ty, RN advised that she will place the order. Skilled SLP services are clinically indicated at this time to improve motor speech and cognitive-linguistic function.    COGNITION: Overall cognitive status: Impaired and History of cognitive impairments - at baseline (ADD) Areas of impairment:  Memory: Impaired: Short term Prospective Functional deficits: Pt reports "wasn't perfect" with med administration prior to CVA due to ADD, and req'd notes and reminders but did not always use them. He endorses rare but consistent skipping of meds, and the need to write notes at work due to detailed work.  Pt tells SLP he will "probably have to take more notes now" because he tells SLP he did not make enough compensations due to his ADD, prior to CVA.  Today pt and SLP reviewed keeping a planner and putting alarms in his phone for his meds; SLP also explained NO STOPPING alarms until he has COMPLETED the thing the alarm is set for. Pt acknowledged understanding.  MOTOR SPEECH: Appears WNL for this evaluation. Yvone Neu states that some afternoons his speech articulation remains better than others, but he feels like he is "almost  where I was before". SLP will not address dysarthria due to not bothering pt and pt's speech back to baseline, currently.   COGNITIVE COMMUNICATION: Following directions: Follows multi-step commands inconsistently  Auditory comprehension: Impaired: inasmuch as pt's attention is affected Verbal expression: WFL Functional communication: WFL  ORAL MOTOR EXAMINATION: Overall status: WFL  STANDARDIZED ASSESSMENTS: [facility] Verbal Learning Assessment indicated pt has some attentional deficits negatively impacting his memory, and likely pure memory deficits as well.  RECALL: 3/12 (below WNL), 4/12 (below WNL), and 8/12 (below WNL). RECOGNITION: 10/12 (below WNL). When SLP asked pt to use pen/paper, he recalled 11/12, and would have been 12/12 "If I could read my writing."  PATIENT REPORTED OUTCOME MEASURES (PROM): Cognitive Function: to be provided next session.   TODAY'S TREATMENT:  DATE:  02/27/23: Today pt and SLP reviewed keeping a planner and putting alarms in his phone for his meds; SLP also explained NO STOPPING alarms until he has COMPLETED the thing the alarm is set for. Pt acknowledged understanding. SLP performed diagnostic therapy and pt wrote down the list as much as possible and he recalled 11/12 and told SLP he could have remembered one more if his writing were more legible.   PATIENT EDUCATION: Education details: see "today's treatment" Person educated: Patient Education method: Explanation, Demonstration, Verbal cues, and Handouts Education comprehension: verbalized understanding, returned demonstration, verbal cues required, and needs further education   GOALS: Goals reviewed with patient? Yes  SHORT TERM GOALS: Target date: 04/01/23  Pt will utilize at least 1 compensation for memory during the day in 3 sessions Baseline: Goal status:  INITIAL  2.  Pt will report 100% compliance with meds and appointments with modified independence for 1 week Baseline:  Goal status: INITIAL  3.  Pt will indicate on-time payment of all bills for one bill cycle with mod I (with use of compensations) Baseline:  Goal status: INITIAL    LONG TERM GOALS: Target date: 05/05/23  Pt will utilize at least 2 compensations for memory during  his workday  Baseline:  Goal status: INITIAL  2.  Pt will report 100% compliance with meds and appointments with modified independent for 2 weeks Baseline:  Goal status: INITIAL  3.  Pt will indicate on-time payment of all bills for one bill cycle  after 04/01/23 with mod I (with use of compensations) Baseline:  Goal status: INITIAL  4.  Pt will score better than initial administration on PROM Baseline:  Goal status: INITIAL  ASSESSMENT:  CLINICAL IMPRESSION: Patient is a 62 y.o. male who was seen today for assessment of speech and memory skills following CVAs 02/18/23. Pt works as an Sports coach at a [employer] and needs to recall details to complete his job. Prior to CVA pt reports he was not adequately compensating for his ADD and would miss bill due dates and medications. Pt is currently using the schedule he got at discharge to track appointments but endorses this would no longer be adequate when he makes appointments for therapy today. He will stop and purchase a planner for calendar and important notes and to-do's. He states he is supposed to return to work full-time on Thursday 03/01/23. SLP is unsure pt will be able to withstand a full day of work 10 days after his CVA. SLP told pt to talk with PCP about this and suggested pt return half time until next Friday, 03/09/23.  OBJECTIVE IMPAIRMENTS: include memory. These impairments are limiting patient from return to work, managing medications, managing appointments, managing finances, household responsibilities, ADLs/IADLs, and  effectively communicating at home and in community. Factors affecting potential to achieve goals and functional outcome are previous level of function.. Patient will benefit from skilled SLP services to address above impairments and improve overall function.  REHAB POTENTIAL: Good  PLAN:  SLP FREQUENCY: 1x/week  SLP DURATION: 8 weeks  PLANNED INTERVENTIONS: Environmental controls, Cognitive reorganization, Internal/external aids, Functional tasks, SLP instruction and feedback, Compensatory strategies, and Patient/family education    [facility], [place] 02/27/2023, 11:13 PM

## 2023-02-27 NOTE — Therapy (Signed)
OUTPATIENT PHYSICAL THERAPY NEURO EVALUATION   Patient Name: ABBIE Ayers MRN: [id number] DOB:1961/03/03, 62 y.o., male Today's Date: 02/28/2023   PCP: Jonathon Jordan, MD REFERRING PROVIDER: Lavina Hamman, MD (Will send to Dr. Stephanie Acre)  [place]:  PT End of Session - 02/28/23 0737     Visit Number 1    Number of Visits 13    Date for PT Re-Evaluation 04/13/23    Authorization Type [employer]    PT Start Time [id number]    PT Stop Time [id number]    PT Time Calculation (min) 40 min    Activity Tolerance Patient tolerated treatment well    Behavior During Therapy [facility] for tasks assessed/performed             Past Medical History:  Diagnosis Date   ADD (attention deficit disorder)    Allergic rhinitis, cause unspecified    Past Surgical History:  Procedure Laterality Date   elbow surgury  1969   left elbow   INGUINAL HERNIA REPAIR  2003   left   Patient Active Problem List   Diagnosis Date Noted   Ischemic cerebrovascular accident (CVA) 02/19/2023   Cerebral ventriculomegaly 02/19/2023   Acute renal failure superimposed on stage 2 chronic kidney disease 02/19/2023   Hypertensive urgency 02/19/2023   Abnormal CXR 02/19/2023   CVA (cerebral vascular accident) 02/19/2023   Fatigue 09/20/2012   Family history of hemochromatosis 09/20/2012   Preventative health care 09/06/2012   ADD (attention deficit disorder)    Allergic rhinitis, cause unspecified     ONSET DATE: 02/18/2023  REFERRING DIAG: I63.9 (ICD-10-CM) - Acute CVA (cerebrovascular accident)  THERAPY DIAG:  Other abnormalities of gait and mobility  Unsteadiness on feet  Muscle weakness (generalized)  Rationale for Evaluation and Treatment: Rehabilitation  SUBJECTIVE:                                                                                                                                                                                             SUBJECTIVE STATEMENT: I'm doing okay, just "so darn  tired."  Maybe this all started about 3 weeks ago.  Woke up and had significant joint pain, overall didn't feel good.  Was having the balance issues, especially in the shower. Pt accompanied by: self  PERTINENT HISTORY: PMH:  NPH, HTN, HLD, obesity, anxiety  PAIN:  Are you having pain? No  PRECAUTIONS: Fall  WEIGHT BEARING RESTRICTIONS: No  FALLS: Has patient fallen in last 6 months? No  LIVING ENVIRONMENT: Lives with: lives with their spouse Lives in: House/apartment Stairs: Yes: Internal: 3 steps; on right going up and External:  12 steps; on right going up Has following equipment at home: None  PLOF: Independent; works two jobs  PATIENT GOALS: Pt's goal for therapy is to get back to normal, to do things without having to think about them.  OBJECTIVE:   DIAGNOSTIC FINDINGS: MRI shows acute CVA; Ventriculomegaly on brain imaging. 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 Request for diagnostic lumbar puncture.  COGNITION: Overall cognitive status:  Slowed answering questions; pt reports at times, wife had difficulty understanding him in the past several weeks.   Vitals:  137/108  HR 93 bpm After approx 5 minutes- 127/99 HR 91 bpm   SENSATION: WFL  COORDINATION: Slightly slowed heel to shin LLE  MUSCLE TONE: LLE: Mild   POSTURE: No Significant postural limitations  LOWER EXTREMITY ROM:   AROM WFL  LOWER EXTREMITY MMT:  Not formally assessed, but at least 3/5 BLEs  MMT Right Eval Left Eval  Hip flexion    Hip extension    Hip abduction    Hip adduction    Hip internal rotation    Hip external rotation    Knee flexion    Knee extension    Ankle dorsiflexion    Ankle plantarflexion    Ankle inversion    Ankle eversion    (Blank rows = not tested)   TRANSFERS: Assistive device utilized: None  Sit to stand: Modified independence Stand to sit: Modified independence  GAIT: Gait pattern: step through pattern, decreased arm swing- Left, decreased  step length- Left, and poor foot clearance- Left Distance walked: 100 ft Assistive device utilized: None Level of assistance: Modified independence Comments: decr L foot clearance  FUNCTIONAL TESTS:  Timed up and go (TUG): 12.62 10 meter walk test: 10.35 sec = 3.09 ft/sec SLS:  R 5.31, L 1.75 sec M-CTSIB  Condition 1: Firm Surface, EO 30 Sec, Normal Sway  Condition 2: Firm Surface, EC 30 Sec, Mild Sway  Condition 3: Foam Surface, EO 30 Sec, Mild and Moderate Sway  Condition 4: Foam Surface, EC 10.31 Sec, Severe Sway    PATIENT SURVEYS:  FOTO Intake 63; predicted 77  TODAY'S TREATMENT:                                                                                                                              DATE: 02/27/2023    PATIENT EDUCATION: Education details: PT eval results, POC, initiated HEP Person educated: Patient Education method: Explanation, Demonstration, and Handouts Education comprehension: verbalized understanding and needs further education  HOME EXERCISE PROGRAM: Access Code: [email or web address] URL: [email or web address] Date: 02/27/2023 Prepared by: [facility]  Exercises - Single Leg Stance with Support  - 1 x daily - 7 x weekly - 1 sets - 3 reps - 15 sec hold - Narrow Stance with Counter Support  - 1 x daily - 7 x weekly - 1 sets - 3 reps - 30 sec hold  GOALS: Goals reviewed with patient?  Yes  SHORT TERM GOALS: Target date: 03/16/2023  Pt will be independent with HEP for improved balance, gait.  Baseline: Goal status: INITIAL  LONG TERM GOALS: Target date: 04/13/2023  Pt will be independent with HEP for improved gait and balance. Baseline:  Goal status: INITIAL  2.  Pt will improve SLS on LLE to at least 3 sec for improved balance for stair and obstacle negotiation. Baseline: 1.75 sec L, 5.31 sec R Goal status: INITIAL  3.  Pt will improve Condition 4 on MCTSIB to at least 30 sec with moderate or less  sway to show improved balance. Baseline: 10 sec severe sway Goal status: INITIAL  4.  Pt will improve FOTO score to at least 77 to demonstrate improved overall functional mobility. Baseline: 63 Goal status: INITIAL  ASSESSMENT:  CLINICAL IMPRESSION: Patient is a 62 y.o. male who was seen today for physical therapy evaluation and treatment for acute CVA/disequilibrium.   He was hospitalized 02/18/2023 after presenting to ED; however, he reports several weeks hx prior of joint pain and decreased balance.  He does not report falls.  MRI shows acute CVA; lumbar puncture performed due to concerns for NPH; he is to follow up with neurologist next week.  Pt presents to OPPT with decreased balance, abnormal gait pattern, decreased functional strength of LLE with decreased foot clearance, decreased LLE SLS, decreased vestibular system use for balance.  Pt would benefit from skilled PT to address the above stated deficits for improved overall functional mobility and return to independent PLOF.  OBJECTIVE IMPAIRMENTS: Abnormal gait, decreased balance, decreased mobility, difficulty walking, and decreased strength.   ACTIVITY LIMITATIONS: standing and locomotion level  PARTICIPATION LIMITATIONS: community activity and occupation  PERSONAL FACTORS: HTN, HLD, obesity, anxiety, ADD are also affecting patient's functional outcome.   REHAB POTENTIAL: Good  CLINICAL DECISION MAKING: Evolving/moderate complexity  EVALUATION COMPLEXITY: Moderate  PLAN:  PT FREQUENCY: 2x/week  PT DURATION: 6 weeks  PLANNED INTERVENTIONS: Therapeutic exercises, Therapeutic activity, Neuromuscular re-education, Balance training, Gait training, Patient/Family education, Self Care, and Vestibular training  PLAN FOR NEXT SESSION: Review initial HEP and progress HEP for balance, LLE strength and SLS   Khristopher Kapaun W., PT 02/28/2023, 7:38 AM  [facility] at [facility] [address], [place], [place] [address] Phone # [phone] Fax # [phone]

## 2023-02-27 NOTE — Patient Instructions (Signed)

## 2023-03-05 NOTE — Progress Notes (Deleted)
[facility]  PATIENT: Micheal Ayers DOB: Sep 04, 1961  REFERRING DOCTOR OR PCP:  *** SOURCE: ***  _________________________________   HISTORICAL  CHIEF COMPLAINT:  No chief complaint on file.   HISTORY OF PRESENT ILLNESS:  ***   Imaging: MRI of the brain 02/18/2023 shows 2 small subacute infarctions in the anterior right frontal corona radiata and in the right thalamus.  Additionally there is severe lateral and third ventriculomegaly though the temporal horns are not enlarged and there is not crowding at the vertex.  No films are available for comparison.  MR angiogram of the neck and head 02/18/2023 showed normal arteries.   REVIEW OF SYSTEMS: Constitutional: No fevers, chills, sweats, or change in appetite Eyes: No visual changes, double vision, eye pain Ear, nose and throat: No hearing loss, ear pain, nasal congestion, sore throat Cardiovascular: No chest pain, palpitations Respiratory:  No shortness of breath at rest or with exertion.   No wheezes GastrointestinaI: No nausea, vomiting, diarrhea, abdominal pain, fecal incontinence Genitourinary:  No dysuria, urinary retention or frequency.  No nocturia. Musculoskeletal:  No neck pain, back pain Integumentary: No rash, pruritus, skin lesions Neurological: as above Psychiatric: No depression at this time.  No anxiety Endocrine: No palpitations, diaphoresis, change in appetite, change in weigh or increased thirst Hematologic/Lymphatic:  No anemia, purpura, petechiae. Allergic/Immunologic: No itchy/runny eyes, nasal congestion, recent allergic reactions, rashes  ALLERGIES: Allergies  Allergen Reactions   Erythromycin Hives    HOME MEDICATIONS:  Current Outpatient Medications:    aspirin EC 81 MG tablet, Take 1 tablet (81 mg total) by mouth daily. Swallow whole., Disp: 30 tablet, Rfl: 12   atomoxetine (STRATTERA) 60 MG capsule, Take 1 capsule (60 mg total) by mouth daily., Disp: 30 capsule, Rfl:  5   clopidogrel (PLAVIX) 75 MG tablet, Take 1 tablet (75 mg total) by mouth daily for 21 days., Disp: 21 tablet, Rfl: 0   Cyanocobalamin (VITAMIN B12 PO), Take 1 tablet by mouth daily., Disp: , Rfl:    rosuvastatin (CRESTOR) 10 MG tablet, Take 1 tablet (10 mg total) by mouth daily., Disp: 30 tablet, Rfl: 2   valsartan-hydrochlorothiazide (DIOVAN-HCT) 80-12.5 MG tablet, Take 1 tablet by mouth daily., Disp: , Rfl:    VITAMIN D PO, Take 1 tablet by mouth daily., Disp: , Rfl:   PAST MEDICAL HISTORY: Past Medical History:  Diagnosis Date   ADD (attention deficit disorder)    Allergic rhinitis, cause unspecified     PAST SURGICAL HISTORY: Past Surgical History:  Procedure Laterality Date   elbow surgury  1969   left elbow   INGUINAL HERNIA REPAIR  2003   left    FAMILY HISTORY: Family History  Problem Relation Age of Onset   Hypertension Other    Alcohol abuse Other    Hypertension Other    Kidney disease Other    Cancer Other    Alcohol abuse Other    Arthritis Other    Stroke Other    Hypertension Other    Cancer Other     SOCIAL HISTORY: Social History   Socioeconomic History   Marital status: Married    Spouse name: Not on file   Number of children: Not on file   Years of education: Not on file   Highest education level: Not on file  Occupational History   Not on file  Tobacco Use   Smoking status: Former   Smokeless tobacco: Never  Substance and Sexual Activity   Alcohol use: Yes  Comment: social   Drug use: No   Sexual activity: Not on file  Other Topics Concern   Not on file  Social History Narrative   Not on file   Social Determinants of Health   Financial Resource Strain: Not on file  Food Insecurity: No Food Insecurity (02/19/2023)   Hunger Vital Sign    Worried About Running Out of Food in the Last Year: Never true    Ran Out of Food in the Last Year: Never true  Transportation Needs: No Transportation Needs (02/19/2023)   PRAPARE -  Administrator, Civil ServiceTransportation    Lack of Transportation (Medical): No    Lack of Transportation (Non-Medical): No  Physical Activity: Not on file  Stress: Not on file  Social Connections: Not on file  Intimate Partner Violence: Not At Risk (02/19/2023)   Humiliation, Afraid, Rape, and Kick questionnaire    Fear of Current or Ex-Partner: No    Emotionally Abused: No    Physically Abused: No    Sexually Abused: No       PHYSICAL EXAM  There were no vitals filed for this visit.  There is no height or weight on file to calculate BMI.   General: The patient is well-developed and well-nourished and in no acute distress  HEENT:  Head is [place]/AT.  Sclera are anicteric.  Funduscopic exam shows normal optic discs and retinal vessels.  Neck: No carotid bruits are noted.  The neck is nontender.  Cardiovascular: The heart has a regular rate and rhythm with a normal S1 and S2. There were no murmurs, gallops or rubs.    Skin: Extremities are without rash or  edema.  Musculoskeletal:  Back is nontender  Neurologic Exam  Mental status: The patient is alert and oriented x 3 at the time of the examination. The patient has apparent normal recent and remote memory, with an apparently normal attention span and concentration ability.   Speech is normal.  Cranial nerves: Extraocular movements are full. Pupils are equal, round, and reactive to light and accomodation.  Visual fields are full.  Facial symmetry is present. There is good facial sensation to soft touch bilaterally.Facial strength is normal.  Trapezius and sternocleidomastoid strength is normal. No dysarthria is noted.  The tongue is midline, and the patient has symmetric elevation of the soft palate. No obvious hearing deficits are noted.  Motor:  Muscle bulk is normal.   Tone is normal. Strength is  5 / 5 in all 4 extremities.   Sensory: Sensory testing is intact to pinprick, soft touch and vibration sensation in all 4 extremities.  Coordination:  Cerebellar testing reveals good finger-nose-finger and heel-to-shin bilaterally.  Gait and station: Station is normal.   Gait is normal. Tandem gait is normal. Romberg is negative.   Reflexes: Deep tendon reflexes are symmetric and normal bilaterally.   Plantar responses are flexor.    DIAGNOSTIC DATA (LABS, IMAGING, TESTING) - I reviewed patient records, labs, notes, testing and imaging myself where available.  Lab Results  Component Value Date   WBC 7.9 02/21/2023   HGB 17.1 (H) 02/21/2023   HCT 49.8 02/21/2023   MCV 86.9 02/21/2023   PLT 230 02/21/2023      Component Value Date/Time   NA 137 02/21/2023 0358   K 3.7 02/21/2023 0358   CL 101 02/21/2023 0358   CO2 26 02/21/2023 0358   GLUCOSE 119 (H) 02/21/2023 0358   BUN 16 02/21/2023 0358   CREATININE 0.99 02/21/2023 0358   CALCIUM 9.4  02/21/2023 0358   PROT 7.4 02/18/2023 1528   ALBUMIN 4.4 02/18/2023 1528   AST 14 (L) 02/18/2023 1528   ALT 16 02/18/2023 1528   ALKPHOS 65 02/18/2023 1528   BILITOT 0.6 02/18/2023 1528   GFRNONAA >60 02/21/2023 0358   Lab Results  Component Value Date   CHOL 176 02/19/2023   HDL 37 (L) 02/19/2023   LDLCALC 118 (H) 02/19/2023   TRIG 107 02/19/2023   CHOLHDL 4.8 02/19/2023   Lab Results  Component Value Date   HGBA1C 6.0 (H) 02/19/2023   No results found for: "VITAMINB12" Lab Results  Component Value Date   TSH 0.795 02/18/2023       ASSESSMENT AND PLAN  ***   Winona Sison A. Epimenio Foot, MD, [facility] 03/05/2023, 8:14 PM Certified in Neurology, Clinical Neurophysiology, Sleep Medicine and Neuroimaging  [facility] [address] [place], [place] [address] [phone]

## 2023-03-06 ENCOUNTER — Encounter: Payer: Self-pay | Admitting: Physical Therapy

## 2023-03-06 ENCOUNTER — Ambulatory Visit: Payer: 59

## 2023-03-06 ENCOUNTER — Ambulatory Visit: Payer: 59 | Admitting: Physical Therapy

## 2023-03-06 DIAGNOSIS — M6281 Muscle weakness (generalized): Secondary | ICD-10-CM

## 2023-03-06 DIAGNOSIS — R2689 Other abnormalities of gait and mobility: Secondary | ICD-10-CM | POA: Diagnosis not present

## 2023-03-06 DIAGNOSIS — R41841 Cognitive communication deficit: Secondary | ICD-10-CM

## 2023-03-06 DIAGNOSIS — R2681 Unsteadiness on feet: Secondary | ICD-10-CM

## 2023-03-06 NOTE — Therapy (Signed)
OUTPATIENT SPEECH LANGUAGE PATHOLOGY TREATMENT   Patient Name: Micheal Ayers MRN: [id number] DOB:09/24/1961, 62 y.o., male Today's Date: 03/06/2023  PCP: Mila Palmer, MD REFERRING PROVIDER: Rolly Salter, MD Mila Palmer, MD (documentation))  END OF SESSION:  End of Session - 03/06/23 0902     Visit Number 2    Number of Visits 9    Date for SLP Re-Evaluation 05/05/23    SLP Start Time 0848    SLP Stop Time  0930    SLP Time Calculation (min) 42 min    Activity Tolerance Patient tolerated treatment well             Past Medical History:  Diagnosis Date   ADD (attention deficit disorder)    Allergic rhinitis, cause unspecified    Past Surgical History:  Procedure Laterality Date   elbow surgury  1969   left elbow   INGUINAL HERNIA REPAIR  2003   left   Patient Active Problem List   Diagnosis Date Noted   Ischemic cerebrovascular accident (CVA) 02/19/2023   Cerebral ventriculomegaly 02/19/2023   Acute renal failure superimposed on stage 2 chronic kidney disease 02/19/2023   Hypertensive urgency 02/19/2023   Abnormal CXR 02/19/2023   CVA (cerebral vascular accident) 02/19/2023   Fatigue 09/20/2012   Family history of hemochromatosis 09/20/2012   Preventative health care 09/06/2012   ADD (attention deficit disorder)    Allergic rhinitis, cause unspecified     ONSET DATE: 02/18/23   REFERRING DIAG:  Diagnosis  I63.9 (ICD-10-CM) - Acute CVA     THERAPY DIAG:  Cognitive communication deficit  Rationale for Evaluation and Treatment: Rehabilitation  SUBJECTIVE:   SUBJECTIVE STATEMENT: Pt is using pictures as memory compensation since last session, and is using other memory strategies since last session. Is buying a planner today and is going to work full time next week. Pt accompanied by: self  PERTINENT HISTORY: HPI: Pt is a 62 y.o. M who presented 02/18/23 with slurred speech and balance difficulty. MRI brian 3/24: Small acute to early  subacute small vessel infarcts involving the subcortical white matter of the anterior right frontal corona radiata and right thalamus. Pt has appointment to discuss possible shunt placement for presence of NPH, next week. PMH: ADD.  PAIN:  Are you having pain? No  PATIENT GOALS: Improve memory  OBJECTIVE:   DIAGNOSTIC FINDINGS:  MR without contrast 02/18/23: IMPRESSION: 1. Small acute to early subacute small vessel infarcts involving the subcortical white matter of the anterior right frontal corona radiata and right thalamus. No associated hemorrhage or mass effect. 2. Marked lateral and third ventriculomegaly, somewhat out of proportion to overlying cortical sulcation. Clinical correlation for possible in pH is advised. Possible underlying cerebral aqueductal stenosis would be the primary differential consideration. No significant transependymal flow of CSF, suggesting at this is a chronic and compensated finding. 3. Underlying mild chronic microvascular ischemic disease. Small remote left cerebellar infarct.  Speech-Language Eval 02-19-23: Pt participated in speech-language-cognition evaluation. 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 He reported that he is on medication for ADD, but still has some difficulty attending at baseline. Pt reported that he has recently noticed that he has been having difficulty counting money at work and that he has been forgetting whether he has completed tasks. He reported that his speech is now "slurred" and that it is now approximately 75% back to baseline. [facility]  Mental Status Examination was completed to evaluate the pt's cognitive-linguistic skills. He achieved a score of 28/30 which is within the normal limits of 27 or more out of 30. However, pt stated that he believes his cognition and processing speed are slower. Some difficulty was noted with  memory and tasks which required executive functions; self-correction and additional processing time were often necessary for accuracy during these tasks. Pt also presented with mild dysarthria characterized by reduced articulatory precision and a reduced ability to adequately coordinate respiration with speech at the conversational level. Speech intelligibility was occasionally impacted during conversation. Pt became tearful towards the end of the evaluation and expressed that he was having difficulty emotionally processing everything; pt verbalized agreement with spiritual care consult and Ty, RN advised that she will place the order. Skilled SLP services are clinically indicated at this time to improve motor speech and cognitive-linguistic function.   PATIENT REPORTED OUTCOME MEASURES (PROM): Cognitive Function: (long form) provided today and pt to return next visit.   TODAY'S TREATMENT:                                                                                                                                         DATE:  03/06/23: Pt has taken his meds without an alarm in the past week and been 100% successful. He has not gotten the planner yet but is going to today. He is using other memory compensations such as taking pictures. SLP encouraged planner usage once pt obtains planner. SLP reviewed pt's goals with him and he agreed that they are appropriate.  Pt to return to work next week and SLP and pt discussed some ways to make this transition easier using compensations. Pt reminded SLP he is dx'd with ADD prior to CVA and did not always have the best habits with memory compensations.  02/27/23: Today pt and SLP reviewed keeping a planner and putting alarms in his phone for his meds; SLP also explained NO STOPPING alarms until he has COMPLETED the thing the alarm is set for. Pt acknowledged understanding. SLP performed diagnostic therapy and pt wrote down the list as much as possible and he  recalled 11/12 and told SLP he could have remembered one more if his writing were more legible.   PATIENT EDUCATION: Education details: see "today's treatment" Person educated: Patient Education method: Explanation, Demonstration, Verbal cues, and Handouts Education comprehension: verbalized understanding, returned demonstration, verbal cues required, and needs further education   GOALS: Goals reviewed with patient? Yes  SHORT TERM GOALS: Target date: 04/01/23  Pt will utilize at least 1 compensation for memory during the day in 3 sessions Baseline:03/06/23 Goal status: Ongoing  2.  Pt will report 100% compliance with meds and appointments with modified independence for 1 week Baseline:  Goal status: Met  3.  Pt will indicate on-time payment of all bills for one bill cycle with mod I (  with use of compensations) Baseline:  Goal status: Ongoing    LONG TERM GOALS: Target date: 05/05/23  Pt will utilize at least 2 compensations for memory during  his workday  Baseline:  Goal status: Ongoing  2.  Pt will report 100% compliance with meds and appointments with modified independent for 2 weeks Baseline:  Goal status: Ongoing  3.  Pt will indicate on-time payment of all bills for one bill cycle  after 04/01/23 with mod I (with use of compensations) Baseline:  Goal status: Ongoing  4.  Pt will score better than initial administration on PROM Baseline:  Goal status: Ongoing  ASSESSMENT:  CLINICAL IMPRESSION: Patient is a 62 y.o. male who was seen today for treatment of speech and memory skills following CVAs 02/18/23. 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 Prior to CVA pt reports he was not adequately compensating for his ADD and would miss bill due dates and medications. Pt is currently using the schedule he got at discharge to track appointments but endorses this would no  longer be adequate when he makes appointments for therapy today. He has not purchased a Pensions consultantplanner for calendar and important notes and to-do's but tells SLP he is going today to do so. SLP is unsure pt will be able to withstand a full day of work a little over 2 weeks after his CVA.   OBJECTIVE IMPAIRMENTS: include memory. These impairments are limiting patient from return to work, managing medications, managing appointments, managing finances, household responsibilities, ADLs/IADLs, and effectively communicating at home and in community. Factors affecting potential to achieve goals and functional outcome are previous level of function.. Patient will benefit from skilled SLP services to address above impairments and improve overall function.  REHAB POTENTIAL: Good  PLAN:  SLP FREQUENCY: 1x/week  SLP DURATION: 8 weeks  PLANNED INTERVENTIONS: Environmental controls, Cognitive reorganization, Internal/external aids, Functional tasks, SLP instruction and feedback, Compensatory strategies, and Patient/family education    [facility] County Medical CenterCHINKE,Tayden Duran, CCC-SLP 03/06/2023, 9:05 AM

## 2023-03-06 NOTE — Therapy (Signed)
OUTPATIENT PHYSICAL THERAPY NEURO TREATMENT   Patient Name: Micheal Ayers MRN: [id number] DOB:1961/04/02, 62 y.o., male Today's Date: 03/06/2023   PCP: Mila PalmerSharon Wolters, MD REFERRING PROVIDER: Rolly SalterPranav M Patel, MD (Will send to Dr. Paulino RilyWolters)  END OF SESSION:  PT End of Session - 03/06/23 0801     Visit Number 2    Number of Visits 13    Date for PT Re-Evaluation 04/13/23    Authorization Type [employer]    PT Start Time 0805    PT Stop Time 0845    PT Time Calculation (min) 40 min    Activity Tolerance Patient tolerated treatment well    Behavior [facility] Therapy [facility] for tasks assessed/performed             Past Medical History:  Diagnosis Date   ADD (attention deficit disorder)    Allergic rhinitis, cause unspecified    Past Surgical History:  Procedure Laterality Date   elbow surgury  1969   left elbow   INGUINAL HERNIA REPAIR  2003   left   Patient Active Problem List   Diagnosis Date Noted   Ischemic cerebrovascular accident (CVA) 02/19/2023   Cerebral ventriculomegaly 02/19/2023   Acute renal failure superimposed on stage 2 chronic kidney disease 02/19/2023   Hypertensive urgency 02/19/2023   Abnormal CXR 02/19/2023   CVA (cerebral vascular accident) 02/19/2023   Fatigue 09/20/2012   Family history of hemochromatosis 09/20/2012   Preventative health care 09/06/2012   ADD (attention deficit disorder)    Allergic rhinitis, cause unspecified     ONSET DATE: 02/18/2023  REFERRING DIAG: I63.9 (ICD-10-CM) - Acute CVA (cerebrovascular accident)  THERAPY DIAG:  Other abnormalities of gait and mobility  Unsteadiness on feet  Muscle weakness (generalized)  Rationale for Evaluation and Treatment: Rehabilitation  SUBJECTIVE:                                                                                                                                                                                             SUBJECTIVE STATEMENT: Doing well except the fatigue.   Woke up today and very fatigued.  To see the neurologist soon. Pt accompanied by: self  PERTINENT HISTORY: PMH:  NPH, HTN, HLD, obesity, anxiety  PAIN:  Are you having pain? No  PRECAUTIONS: Fall  WEIGHT BEARING RESTRICTIONS: No  FALLS: Has patient fallen in last 6 months? No  LIVING ENVIRONMENT: Lives with: lives with their spouse Lives in: House/apartment Stairs: Yes: Internal: 3 steps; on right going up and External: 12 steps; on right going up Has following equipment at home: None  PLOF: Independent; works two jobs  PATIENT  GOALS: Pt's goal for therapy is to get back to normal, to do things without having to think about them.  OBJECTIVE:   TODAY'S TREATMENT: 03/06/2023 Activity Comments  Sit<>stand x 5 reps from mat surface, then 2nd set x 5 with LLE tucked posteriorly Decreased eccentric control   BP 115/95, HR 97 bpm   Alt step taps to 6" step, 2 x 10 reps Decreased foot clearance LLE  Marching in place 10 reps, 2#   Hip abduction/hip extension 2#, 2 x 10 reps Cues for upright trunk  Reviewed HEP from last visit: -SLS and Romberg Good return demo  Romberg EC 30 sec  Romberg EO head turns/head nods x 5 Light UE support  Standing on Airex:  feet apart head turns/nods x 5 EO Feet apart EC 3 x 30" Light UE support  Gait x 3 minutes in clinic, supervision Cues for posture, arm swing, L foot clearance   Access Code: [email or web address] URL: [email or web address] Date: 03/06/2023 Prepared by: [facility] - Outpatient  Rehab - [facility]  Program Notes Walk for 3-4 minutes, in your home, 3 times daily.  Make sure to stand tall, clear your feet with walking.  Exercises - Single Leg Stance with Support  - 1 x daily - 7 x weekly - 1 sets - 3 reps - 15 sec hold - Narrow Stance with Counter Support  - 1 x daily - 7 x weekly - 1 sets - 3 reps - 30 sec hold - March in Place  - 1 x daily - 3 x weekly - 3 sets - 10 reps - Standing Hip Abduction with Counter Support  - 1  x daily - 3 x weekly - 3 sets - 10 reps - Standing Hip Extension with Counter Support  - 1 x daily - 3 x weekly - 3 sets - 10 reps  PATIENT EDUCATION: Education details: HEP additions Person educated: Patient Education method: Explanation, Demonstration, and Handouts Education comprehension: verbalized understanding and returned demonstration  --------------------------------------------------------------- Objective measures below taken at initial evaluation:  DIAGNOSTIC FINDINGS: MRI shows acute CVA; Ventriculomegaly on brain imaging. 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 Request for diagnostic lumbar puncture.  COGNITION: Overall cognitive status:  Slowed answering questions; pt reports at times, wife had difficulty understanding him in the past several weeks.   Vitals:  137/108  HR 93 bpm After approx 5 minutes- 127/99 HR 91 bpm   SENSATION: WFL  COORDINATION: Slightly slowed heel to shin LLE  MUSCLE TONE: LLE: Mild   POSTURE: No Significant postural limitations  LOWER EXTREMITY ROM:   AROM WFL  LOWER EXTREMITY MMT:  Not formally assessed, but at least 3/5 BLEs  MMT Right Eval Left Eval  Hip flexion    Hip extension    Hip abduction    Hip adduction    Hip internal rotation    Hip external rotation    Knee flexion    Knee extension    Ankle dorsiflexion    Ankle plantarflexion    Ankle inversion    Ankle eversion    (Blank rows = not tested)   TRANSFERS: Assistive device utilized: None  Sit to stand: Modified independence Stand to sit: Modified independence  GAIT: Gait pattern: step through pattern, decreased arm swing- Left, decreased step length- Left, and poor foot clearance- Left Distance walked: 100 ft Assistive device utilized: None Level of assistance: Modified independence Comments: decr L foot clearance  FUNCTIONAL TESTS:  Timed up and go (TUG): 12.62 10 meter walk test:  10.35 sec = 3.09 ft/sec SLS:  R 5.31, L 1.75 sec M-CTSIB   Condition 1: Firm Surface, EO 30 Sec, Normal Sway  Condition 2: Firm Surface, EC 30 Sec, Mild Sway  Condition 3: Foam Surface, EO 30 Sec, Mild and Moderate Sway  Condition 4: Foam Surface, EC 10.31 Sec, Severe Sway    PATIENT SURVEYS:  FOTO Intake 63; predicted 77  TODAY'S TREATMENT:                                                                                                                              DATE: 02/27/2023    PATIENT EDUCATION: Education details: PT eval results, POC, initiated HEP Person educated: Patient Education method: Explanation, Demonstration, and Handouts Education comprehension: verbalized understanding and needs further education  HOME EXERCISE PROGRAM: Access Code: [email or web address] URL: [email or web address] Date: 02/27/2023 Prepared by: [facility] - Outpatient  Rehab - [facility]  Exercises - Single Leg Stance with Support  - 1 x daily - 7 x weekly - 1 sets - 3 reps - 15 sec hold - Narrow Stance with Counter Support  - 1 x daily - 7 x weekly - 1 sets - 3 reps - 30 sec hold  GOALS: Goals reviewed with patient? Yes  SHORT TERM GOALS: Target date: 03/16/2023  Pt will be independent with HEP for improved balance, gait.  Baseline: Goal status: IN PROGRESS  LONG TERM GOALS: Target date: 04/13/2023  Pt will be independent with HEP for improved gait and balance. Baseline:  Goal status: IN PROGRESS  2.  Pt will improve SLS on LLE to at least 3 sec for improved balance for stair and obstacle negotiation. Baseline: 1.75 sec L, 5.31 sec R Goal status: IN PROGRESS  3.  Pt will improve Condition 4 on MCTSIB to at least 30 sec with moderate or less sway to show improved balance. Baseline: 10 sec severe sway Goal status: IN PROGRESS  4.  Pt will improve FOTO score to at least 77 to demonstrate improved overall functional mobility. Baseline: 63 Goal status: IN PROGRESS  ASSESSMENT:  CLINICAL IMPRESSION: Skilled PT session today focused  on strengthening and balance.  Pt continues to report fatigue and PT encouraged pt to ask MD at appointment tomorrow about fatigue.  Added exercises to address LLE strengthening, and worked on Banker. He continues to use light UE support for balance; he will continue to benefit from skilled PT towards goals for improved overall functional mobility and return to independent PLOF.  OBJECTIVE IMPAIRMENTS: Abnormal gait, decreased balance, decreased mobility, difficulty walking, and decreased strength.   ACTIVITY LIMITATIONS: standing and locomotion level  PARTICIPATION LIMITATIONS: community activity and occupation  PERSONAL FACTORS: HTN, HLD, obesity, anxiety, ADD are also affecting patient's functional outcome.   REHAB POTENTIAL: Good  CLINICAL DECISION MAKING: Evolving/moderate complexity  EVALUATION COMPLEXITY: Moderate  PLAN:  PT FREQUENCY: 2x/week  PT DURATION: 6 weeks  PLANNED INTERVENTIONS: Therapeutic exercises, Therapeutic activity, Neuromuscular re-education, Balance  training, Gait training, Patient/Family education, Self Care, and Vestibular training  PLAN FOR NEXT SESSION: Review additions toHEP and progress HEP for balance, LLE strength and SLS.  Dynamic balance, attention to L side.   Gean Maidens., PT 03/06/2023, 8:37 AM  [facility] Re[facility] at [facility] [address] [place], [address] [place], [place] [address] Phone # [phone] Fax # [phone]

## 2023-03-07 ENCOUNTER — Encounter: Payer: Self-pay | Admitting: Neurology

## 2023-03-07 ENCOUNTER — Ambulatory Visit: Payer: 59 | Admitting: Neurology

## 2023-03-08 ENCOUNTER — Other Ambulatory Visit: Payer: Self-pay | Admitting: Neurosurgery

## 2023-03-12 ENCOUNTER — Encounter (HOSPITAL_COMMUNITY): Payer: Self-pay | Admitting: Neurosurgery

## 2023-03-12 NOTE — Progress Notes (Signed)
SDW call  Patient was given pre-op instructions over the phone. Patient verbalized understanding of instructions provided.     PCP Deboraha Sprang at [facility] Cardiologist -  Pulmonary:    PPM/ICD - Denies  Chest x-ray - 02/18/2023 EKG -  02/21/2023 Stress Test - ECHO - 02/19/2023 Cardiac Cath -   Sleep Study/sleep apnea/CPAP: Denies  Non-diabetic   Blood Thinner Instructions: Plavix, states last dose was Friday, March 09, 2023 Aspirin Instructions: ASA, states last dose was Friday, March 09, 2023   ERAS Protcol - No, NPO PRE-SURGERY Ensure or G2- No   COVID TEST- n/a    Anesthesia review: Yes. HTN, stroke   Patient denies shortness of breath, fever, cough and chest pain over the phone call    Your procedure is scheduled on Tuesday March 13, 2023  Report to [facility] Main Entrance "A" at 0530  A.M., then check in with the Admitting office.  Call this number if you have problems the morning of surgery:  [phone]   If you have any questions prior to your surgery date call [phone]: Open Monday-Friday 8am-4pm If you experience any cold or flu symptoms such as cough, fever, chills, shortness of breath, etc. between now and your scheduled surgery, please notify us at the above number     Remember:  Do not eat or drink after midnight the night before your surgery   Take these medicines the morning of surgery with A SIP OF WATER:  Strattera, prilosec  As of today, STOP taking any Aspirin (unless otherwise instructed by your surgeon) Aleve, Naproxen, Ibuprofen, Motrin, Advil, Goody's, BC's, all herbal medications, fish oil, and all vitamins.

## 2023-03-13 ENCOUNTER — Inpatient Hospital Stay (HOSPITAL_COMMUNITY)
Admission: RE | Admit: 2023-03-13 | Discharge: 2023-03-14 | DRG: 033 | Disposition: A | Discharge status: Home / Self Care | Payer: 59 | Attending: Neurosurgery | Admitting: Neurosurgery

## 2023-03-13 ENCOUNTER — Other Ambulatory Visit: Payer: Self-pay

## 2023-03-13 ENCOUNTER — Encounter (HOSPITAL_COMMUNITY): Payer: Self-pay | Admitting: Neurosurgery

## 2023-03-13 ENCOUNTER — Inpatient Hospital Stay (HOSPITAL_COMMUNITY): Payer: 59 | Admitting: Physician Assistant

## 2023-03-13 ENCOUNTER — Inpatient Hospital Stay (HOSPITAL_COMMUNITY)
Admission: RE | Disposition: A | Discharge status: Home / Self Care | Payer: Self-pay | Source: Home / Self Care | Attending: Neurosurgery

## 2023-03-13 DIAGNOSIS — Z881 Allergy status to other antibiotic agents status: Secondary | ICD-10-CM | POA: Diagnosis not present

## 2023-03-13 DIAGNOSIS — Z841 Family history of disorders of kidney and ureter: Secondary | ICD-10-CM

## 2023-03-13 DIAGNOSIS — Z823 Family history of stroke: Secondary | ICD-10-CM | POA: Diagnosis not present

## 2023-03-13 DIAGNOSIS — Z8261 Family history of arthritis: Secondary | ICD-10-CM

## 2023-03-13 DIAGNOSIS — Z7902 Long term (current) use of antithrombotics/antiplatelets: Secondary | ICD-10-CM | POA: Diagnosis not present

## 2023-03-13 DIAGNOSIS — Z811 Family history of alcohol abuse and dependence: Secondary | ICD-10-CM

## 2023-03-13 DIAGNOSIS — G911 Obstructive hydrocephalus: Secondary | ICD-10-CM

## 2023-03-13 DIAGNOSIS — Z809 Family history of malignant neoplasm, unspecified: Secondary | ICD-10-CM | POA: Diagnosis not present

## 2023-03-13 DIAGNOSIS — Z79899 Other long term (current) drug therapy: Secondary | ICD-10-CM | POA: Diagnosis not present

## 2023-03-13 DIAGNOSIS — Z8249 Family history of ischemic heart disease and other diseases of the circulatory system: Secondary | ICD-10-CM

## 2023-03-13 DIAGNOSIS — I10 Essential (primary) hypertension: Secondary | ICD-10-CM | POA: Diagnosis present

## 2023-03-13 DIAGNOSIS — Z7982 Long term (current) use of aspirin: Secondary | ICD-10-CM | POA: Diagnosis not present

## 2023-03-13 DIAGNOSIS — Z87891 Personal history of nicotine dependence: Secondary | ICD-10-CM

## 2023-03-13 DIAGNOSIS — Z8673 Personal history of transient ischemic attack (TIA), and cerebral infarction without residual deficits: Secondary | ICD-10-CM

## 2023-03-13 HISTORY — DX: Essential (primary) hypertension: I10

## 2023-03-13 HISTORY — PX: VENTRICULOPERITONEAL SHUNT: SHX204

## 2023-03-13 HISTORY — DX: Chronic kidney disease, unspecified: N18.9

## 2023-03-13 HISTORY — DX: Cerebral infarction, unspecified: I63.9

## 2023-03-13 SURGERY — SHUNT INSERTION VENTRICULAR-PERITONEAL
Anesthesia: General | Laterality: Right

## 2023-03-13 MED ORDER — ACETAMINOPHEN 650 MG RE SUPP: 650.0000 mg | RECTAL | Status: DC | PRN

## 2023-03-13 MED ORDER — FENTANYL CITRATE (PF) 250 MCG/5ML IJ SOLN
INTRAMUSCULAR | Status: AC
  Filled 2023-03-13: qty 5

## 2023-03-13 MED ORDER — VALSARTAN-HYDROCHLOROTHIAZIDE 80-12.5 MG PO TABS: 1.0000 | ORAL_TABLET | Freq: Every day | ORAL | Status: DC

## 2023-03-13 MED ORDER — OXYCODONE HCL 5 MG PO TABS: 5.0000 mg | ORAL_TABLET | Freq: Once | ORAL | Status: DC | PRN

## 2023-03-13 MED ORDER — HYDROCODONE-ACETAMINOPHEN 5-325 MG PO TABS
1.0000 | ORAL_TABLET | ORAL | Status: DC | PRN
  Administered 2023-03-13 – 2023-03-14 (×5): 1 via ORAL
  Filled 2023-03-13 (×5): qty 1

## 2023-03-13 MED ORDER — VANCOMYCIN HCL 1000 MG IV SOLR
INTRAVENOUS | Status: AC
  Filled 2023-03-13: qty 20

## 2023-03-13 MED ORDER — ONDANSETRON HCL 4 MG/2ML IJ SOLN
INTRAMUSCULAR | Status: DC | PRN
  Administered 2023-03-13: 4 mg via INTRAVENOUS

## 2023-03-13 MED ORDER — SUGAMMADEX SODIUM 200 MG/2ML IV SOLN
INTRAVENOUS | Status: DC | PRN
  Administered 2023-03-13: 200 mg via INTRAVENOUS

## 2023-03-13 MED ORDER — LEVETIRACETAM IN NACL 1000 MG/100ML IV SOLN
1000.0000 mg | Freq: Once | INTRAVENOUS | Status: DC
  Filled 2023-03-13: qty 100

## 2023-03-13 MED ORDER — PROPOFOL 10 MG/ML IV BOLUS
INTRAVENOUS | Status: DC | PRN
  Administered 2023-03-13: 180 mg via INTRAVENOUS
  Administered 2023-03-13: 20 mg via INTRAVENOUS

## 2023-03-13 MED ORDER — 0.9 % SODIUM CHLORIDE (POUR BTL) OPTIME
TOPICAL | Status: DC | PRN
  Administered 2023-03-13: 1000 mL

## 2023-03-13 MED ORDER — ROCURONIUM BROMIDE 10 MG/ML (PF) SYRINGE
PREFILLED_SYRINGE | INTRAVENOUS | Status: DC | PRN
  Administered 2023-03-13: 50 mg via INTRAVENOUS
  Administered 2023-03-13: 20 mg via INTRAVENOUS

## 2023-03-13 MED ORDER — OXYCODONE HCL 5 MG/5ML PO SOLN: 5.0000 mg | Freq: Once | ORAL | Status: DC | PRN

## 2023-03-13 MED ORDER — THROMBIN 5000 UNITS EX SOLR
CUTANEOUS | Status: AC
  Filled 2023-03-13: qty 10000

## 2023-03-13 MED ORDER — CHOLECALCIFEROL 10 MCG (400 UNIT) PO TABS
20.0000 ug | ORAL_TABLET | Freq: Every day | ORAL | Status: DC
  Administered 2023-03-13 – 2023-03-14 (×2): 20 ug via ORAL
  Filled 2023-03-13 (×2): qty 1

## 2023-03-13 MED ORDER — CHLORHEXIDINE GLUCONATE CLOTH 2 % EX PADS: 6.0000 | MEDICATED_PAD | Freq: Once | CUTANEOUS | Status: DC

## 2023-03-13 MED ORDER — VITAMIN B-12 1000 MCG PO TABS
1000.0000 ug | ORAL_TABLET | Freq: Every day | ORAL | Status: DC
  Administered 2023-03-14: 1000 ug via ORAL
  Filled 2023-03-13: qty 1

## 2023-03-13 MED ORDER — ONDANSETRON HCL 4 MG/2ML IJ SOLN: 4.0000 mg | Freq: Four times a day (QID) | INTRAMUSCULAR | Status: DC | PRN

## 2023-03-13 MED ORDER — HYDROCHLOROTHIAZIDE 12.5 MG PO TABS
12.5000 mg | ORAL_TABLET | Freq: Every day | ORAL | Status: DC
  Administered 2023-03-13 – 2023-03-14 (×2): 12.5 mg via ORAL
  Filled 2023-03-13 (×2): qty 1

## 2023-03-13 MED ORDER — DEXAMETHASONE SODIUM PHOSPHATE 10 MG/ML IJ SOLN
INTRAMUSCULAR | Status: DC | PRN
  Administered 2023-03-13: 10 mg via INTRAVENOUS

## 2023-03-13 MED ORDER — ONDANSETRON HCL 4 MG PO TABS: 4.0000 mg | ORAL_TABLET | ORAL | Status: DC | PRN

## 2023-03-13 MED ORDER — CEFAZOLIN SODIUM-DEXTROSE 2-4 GM/100ML-% IV SOLN
2.0000 g | INTRAVENOUS | Status: AC
  Administered 2023-03-13: 2 g via INTRAVENOUS
  Filled 2023-03-13: qty 100

## 2023-03-13 MED ORDER — LIDOCAINE 2% (20 MG/ML) 5 ML SYRINGE
INTRAMUSCULAR | Status: DC | PRN
  Administered 2023-03-13: 80 mg via INTRAVENOUS

## 2023-03-13 MED ORDER — IRBESARTAN 75 MG PO TABS
75.0000 mg | ORAL_TABLET | Freq: Every day | ORAL | Status: DC
  Administered 2023-03-13 – 2023-03-14 (×2): 75 mg via ORAL
  Filled 2023-03-13 (×2): qty 1

## 2023-03-13 MED ORDER — CHLORHEXIDINE GLUCONATE 0.12 % MT SOLN
15.0000 mL | Freq: Once | OROMUCOSAL | Status: AC
  Administered 2023-03-13: 15 mL via OROMUCOSAL
  Filled 2023-03-13: qty 15

## 2023-03-13 MED ORDER — THROMBIN (RECOMBINANT) 5000 UNITS EX SOLR
CUTANEOUS | Status: DC | PRN
  Administered 2023-03-13: 10 mL via TOPICAL

## 2023-03-13 MED ORDER — MANNITOL 25 % IV SOLN
INTRAVENOUS | Status: AC
  Filled 2023-03-13: qty 150

## 2023-03-13 MED ORDER — SODIUM CHLORIDE 0.9 % IV SOLN: INTRAVENOUS | Status: DC | PRN

## 2023-03-13 MED ORDER — LACTATED RINGERS IV SOLN: INTRAVENOUS | Status: DC

## 2023-03-13 MED ORDER — FENTANYL CITRATE (PF) 100 MCG/2ML IJ SOLN
25.0000 ug | INTRAMUSCULAR | Status: DC | PRN
  Administered 2023-03-13 (×4): 25 ug via INTRAVENOUS

## 2023-03-13 MED ORDER — ROSUVASTATIN CALCIUM 5 MG PO TABS
10.0000 mg | ORAL_TABLET | Freq: Every day | ORAL | Status: DC
  Administered 2023-03-13 – 2023-03-14 (×2): 10 mg via ORAL
  Filled 2023-03-13 (×2): qty 2

## 2023-03-13 MED ORDER — ACETAMINOPHEN 325 MG PO TABS: 650.0000 mg | ORAL_TABLET | ORAL | Status: DC | PRN

## 2023-03-13 MED ORDER — HYDROMORPHONE HCL 1 MG/ML IJ SOLN: 0.5000 mg | INTRAMUSCULAR | Status: DC | PRN

## 2023-03-13 MED ORDER — VANCOMYCIN HCL 1000 MG IV SOLR
INTRAVENOUS | Status: DC | PRN
  Administered 2023-03-13: 1000 mg via TOPICAL

## 2023-03-13 MED ORDER — ORAL CARE MOUTH RINSE: 15.0000 mL | Freq: Once | OROMUCOSAL | Status: AC

## 2023-03-13 MED ORDER — PANTOPRAZOLE SODIUM 20 MG PO TBEC
20.0000 mg | DELAYED_RELEASE_TABLET | Freq: Every day | ORAL | Status: DC
  Administered 2023-03-14: 20 mg via ORAL
  Filled 2023-03-13 (×3): qty 1

## 2023-03-13 MED ORDER — PROPOFOL 10 MG/ML IV BOLUS
INTRAVENOUS | Status: AC
  Filled 2023-03-13: qty 20

## 2023-03-13 MED ORDER — MIDAZOLAM HCL 2 MG/2ML IJ SOLN
INTRAMUSCULAR | Status: AC
  Filled 2023-03-13: qty 2

## 2023-03-13 MED ORDER — DOCUSATE SODIUM 100 MG PO CAPS
100.0000 mg | ORAL_CAPSULE | Freq: Two times a day (BID) | ORAL | Status: DC
  Administered 2023-03-13 – 2023-03-14 (×3): 100 mg via ORAL
  Filled 2023-03-13 (×3): qty 1

## 2023-03-13 MED ORDER — PROMETHAZINE HCL 12.5 MG PO TABS: 12.5000 mg | ORAL_TABLET | ORAL | Status: DC | PRN

## 2023-03-13 MED ORDER — FENTANYL CITRATE (PF) 100 MCG/2ML IJ SOLN
INTRAMUSCULAR | Status: AC
  Filled 2023-03-13: qty 2

## 2023-03-13 MED ORDER — ONDANSETRON HCL 4 MG/2ML IJ SOLN: 4.0000 mg | INTRAMUSCULAR | Status: DC | PRN

## 2023-03-13 MED ORDER — PHENYLEPHRINE 80 MCG/ML (10ML) SYRINGE FOR IV PUSH (FOR BLOOD PRESSURE SUPPORT)
PREFILLED_SYRINGE | INTRAVENOUS | Status: DC | PRN
  Administered 2023-03-13: 80 ug via INTRAVENOUS

## 2023-03-13 MED ORDER — ATOMOXETINE HCL 60 MG PO CAPS
60.0000 mg | ORAL_CAPSULE | Freq: Every day | ORAL | Status: DC
  Administered 2023-03-14: 60 mg via ORAL
  Filled 2023-03-13 (×2): qty 1

## 2023-03-13 MED ORDER — MIDAZOLAM HCL 2 MG/2ML IJ SOLN
INTRAMUSCULAR | Status: DC | PRN
  Administered 2023-03-13: 2 mg via INTRAVENOUS

## 2023-03-13 MED ORDER — FENTANYL CITRATE (PF) 250 MCG/5ML IJ SOLN
INTRAMUSCULAR | Status: DC | PRN
  Administered 2023-03-13: 100 ug via INTRAVENOUS
  Administered 2023-03-13: 25 ug via INTRAVENOUS
  Administered 2023-03-13: 50 ug via INTRAVENOUS
  Administered 2023-03-13: 25 ug via INTRAVENOUS
  Administered 2023-03-13: 50 ug via INTRAVENOUS

## 2023-03-13 MED ORDER — CEFAZOLIN SODIUM-DEXTROSE 2-4 GM/100ML-% IV SOLN
2.0000 g | Freq: Three times a day (TID) | INTRAVENOUS | Status: AC
  Administered 2023-03-13 (×2): 2 g via INTRAVENOUS
  Filled 2023-03-13 (×2): qty 100

## 2023-03-13 MED ORDER — HEMOSTATIC AGENTS (NO CHARGE) OPTIME
TOPICAL | Status: DC | PRN
  Administered 2023-03-13: 1 via TOPICAL

## 2023-03-13 MED ORDER — HYDROMORPHONE HCL 1 MG/ML IJ SOLN
INTRAMUSCULAR | Status: AC
  Filled 2023-03-13: qty 0.5

## 2023-03-13 MED ORDER — HYDROMORPHONE HCL 1 MG/ML IJ SOLN
INTRAMUSCULAR | Status: DC | PRN
  Administered 2023-03-13: .5 mg via INTRAVENOUS

## 2023-03-13 SURGICAL SUPPLY — 66 items
ADH SKN CLS APL DERMABOND .7 (GAUZE/BANDAGES/DRESSINGS) ×1
APL SKNCLS STERI-STRIP NONHPOA (GAUZE/BANDAGES/DRESSINGS) ×1
BAG COUNTER SPONGE SURGICOUNT (BAG) ×2 IMPLANT
BAG DECANTER FOR FLEXI CONT (MISCELLANEOUS) ×2 IMPLANT
BAG SPNG CNTER NS LX DISP (BAG) ×2
BAND INSRT 18 STRL LF DISP RB (MISCELLANEOUS)
BAND RUBBER #18 3X1/16 STRL (MISCELLANEOUS) IMPLANT
BENZOIN TINCTURE PRP APPL 2/3 (GAUZE/BANDAGES/DRESSINGS) ×2 IMPLANT
BNDG ADH 1X3 SHEER STRL LF (GAUZE/BANDAGES/DRESSINGS) ×6 IMPLANT
BNDG ADH THN 3X1 STRL LF (GAUZE/BANDAGES/DRESSINGS) ×3
BNDG GAUZE DERMACEA FLUFF 4 (GAUZE/BANDAGES/DRESSINGS) IMPLANT
BNDG GZE DERMACEA 4 6PLY (GAUZE/BANDAGES/DRESSINGS)
BUR ACORN 6.0 PRECISION (BURR) ×2 IMPLANT
CANISTER SUCT 3000ML PPV (MISCELLANEOUS) ×2 IMPLANT
CATH VENTRICULAR 9CM (Shunt) IMPLANT
CLIP RANEY DISP (INSTRUMENTS) IMPLANT
CLSR STERI-STRIP ANTIMIC 1/2X4 (GAUZE/BANDAGES/DRESSINGS) IMPLANT
DERMABOND ADVANCED .7 DNX12 (GAUZE/BANDAGES/DRESSINGS) ×2 IMPLANT
DRAPE INCISE IOBAN 85X60 (DRAPES) ×2 IMPLANT
DRAPE ORTHO SPLIT 77X108 STRL (DRAPES) ×2
DRAPE SURG 17X23 STRL (DRAPES) IMPLANT
DRAPE SURG ORHT 6 SPLT 77X108 (DRAPES) ×2 IMPLANT
DRSG OPSITE 4X5.5 SM (GAUZE/BANDAGES/DRESSINGS) ×4 IMPLANT
DRSG OPSITE POSTOP 3X4 (GAUZE/BANDAGES/DRESSINGS) IMPLANT
ELECT REM PT RETURN 9FT ADLT (ELECTROSURGICAL) ×1
ELECTRODE REM PT RTRN 9FT ADLT (ELECTROSURGICAL) ×2 IMPLANT
GAUZE 4X4 16PLY ~~LOC~~+RFID DBL (SPONGE) IMPLANT
GLOVE ECLIPSE 9.0 STRL (GLOVE) ×2 IMPLANT
GLOVE EXAM NITRILE XL STR (GLOVE) IMPLANT
GOWN STRL REUS W/ TWL LRG LVL3 (GOWN DISPOSABLE) IMPLANT
GOWN STRL REUS W/ TWL XL LVL3 (GOWN DISPOSABLE) IMPLANT
GOWN STRL REUS W/TWL 2XL LVL3 (GOWN DISPOSABLE) IMPLANT
GOWN STRL REUS W/TWL LRG LVL3 (GOWN DISPOSABLE)
GOWN STRL REUS W/TWL XL LVL3 (GOWN DISPOSABLE)
HEMOSTAT SURGICEL 2X14 (HEMOSTASIS) IMPLANT
KIT BASIN OR (CUSTOM PROCEDURE TRAY) ×2 IMPLANT
KIT TURNOVER KIT B (KITS) ×2 IMPLANT
MARKER SKIN DUAL TIP RULER LAB (MISCELLANEOUS) ×2 IMPLANT
NS IRRIG 1000ML POUR BTL (IV SOLUTION) ×2 IMPLANT
PACK LAMINECTOMY NEURO (CUSTOM PROCEDURE TRAY) ×2 IMPLANT
PAD ARMBOARD 7.5X6 YLW CONV (MISCELLANEOUS) ×2 IMPLANT
SHEATH PERITONEAL INTRO 46 (SHEATH) IMPLANT
SHEATH PERITONEAL INTRO 61 (SHEATH) IMPLANT
SHUNT STRATA 11 SNAP REG (Shunt) IMPLANT
SOL ELECTROSURG ANTI STICK (MISCELLANEOUS) ×1
SOLUTION ELECTROSURG ANTI STCK (MISCELLANEOUS) ×2 IMPLANT
SPONGE INTESTINAL PEANUT (DISPOSABLE) IMPLANT
SPONGE SURGIFOAM ABS GEL SZ50 (HEMOSTASIS) IMPLANT
SPONGE T-LAP 4X18 ~~LOC~~+RFID (SPONGE) IMPLANT
STAPLER VISISTAT 35W (STAPLE) ×2 IMPLANT
STRIP CLOSURE SKIN 1/2X4 (GAUZE/BANDAGES/DRESSINGS) ×4 IMPLANT
SUT CHROMIC 3 0 SH 27 (SUTURE) IMPLANT
SUT ETHILON 3 0 FSL (SUTURE) IMPLANT
SUT ETHILON 4 0 PS 2 18 (SUTURE) IMPLANT
SUT NURALON 4 0 TR CR/8 (SUTURE) IMPLANT
SUT SILK 0 TIES 10X30 (SUTURE) IMPLANT
SUT SILK 2 0 TIES 17X18 (SUTURE) ×1
SUT SILK 2-0 18XBRD TIE BLK (SUTURE) ×2 IMPLANT
SUT SILK 3 0 SH 30 (SUTURE) IMPLANT
SUT VIC AB 2-0 CT2 18 VCP726D (SUTURE) ×2 IMPLANT
SUT VIC AB 3-0 SH 8-18 (SUTURE) ×2 IMPLANT
SUT VICRYL 4-0 PS2 18IN ABS (SUTURE) IMPLANT
TOWEL GREEN STERILE (TOWEL DISPOSABLE) ×2 IMPLANT
TOWEL GREEN STERILE FF (TOWEL DISPOSABLE) ×2 IMPLANT
UNDERPAD 30X36 HEAVY ABSORB (UNDERPADS AND DIAPERS) ×2 IMPLANT
WATER STERILE IRR 1000ML POUR (IV SOLUTION) ×2 IMPLANT

## 2023-03-13 NOTE — Anesthesia Preprocedure Evaluation (Signed)
Anesthesia Evaluation  Patient identified by MRN, date of birth, ID band Patient awake    Reviewed: Allergy & Precautions, H&P , NPO status , Patient's Chart, lab work & pertinent test results  Airway Mallampati: II   Neck ROM: full    Dental   Pulmonary former smoker   breath sounds clear to auscultation       Cardiovascular hypertension,  Rhythm:regular Rate:Normal     Neuro/Psych hydrocephalus CVA    GI/Hepatic   Endo/Other    Renal/GU      Musculoskeletal   Abdominal   Peds  Hematology   Anesthesia Other Findings   Reproductive/Obstetrics                             Anesthesia Physical Anesthesia Plan  ASA: 3  Anesthesia Plan: General   Post-op Pain Management:    Induction: Intravenous  PONV Risk Score and Plan: 2 and Ondansetron, Dexamethasone, Midazolam and Treatment may vary due to age or medical condition  Airway Management Planned: Oral ETT  Additional Equipment:   Intra-op Plan:   Post-operative Plan: Extubation in OR  Informed Consent: I have reviewed the patients History and Physical, chart, labs and discussed the procedure including the risks, benefits and alternatives for the proposed anesthesia with the patient or authorized representative who has indicated his/her understanding and acceptance.     Dental advisory given  Plan Discussed with: CRNA, Anesthesiologist and Surgeon  Anesthesia Plan Comments:        Anesthesia Quick Evaluation

## 2023-03-13 NOTE — Brief Op Note (Signed)
03/13/2023  9:35 AM  PATIENT:  Micheal Ayers  61 y.o. male  PRE-OPERATIVE DIAGNOSIS:  Obstructive hydrocephalus  POST-OPERATIVE DIAGNOSIS:  Obstructive hydrocephalus  PROCEDURE:  Procedure(s): Shunt Placment - right occipital (Right)  SURGEON:  Surgeon(s) and Role:    Julio Sicks, MD - Primary  PHYSICIAN ASSISTANT:   ASSISTANTSMarland Mcalpine   ANESTHESIA:   general  EBL:  20 mL   BLOOD ADMINISTERED:none  DRAINS: none   LOCAL MEDICATIONS USED:  NONE  SPECIMEN:  No Specimen  DISPOSITION OF SPECIMEN:  N/A  COUNTS:  YES  TOURNIQUET:  * No tourniquets in log *  DICTATION: .Dragon Dictation  PLAN OF CARE: Admit to inpatient   PATIENT DISPOSITION:  PACU - hemodynamically stable.   Delay start of Pharmacological VTE agent (>24hrs) due to surgical blood loss or risk of bleeding: yes

## 2023-03-13 NOTE — Op Note (Signed)
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  Asst. Surgeon: Doran Durand, NP  Assistant present for entire case  Anesthesia: General  Indication: 62 year old male with progressive gait instability and memory loss.  Workup demonstrates evidence of marked ventriculomegaly consistent with chronic abductor stenosis.  Patient presents now for VP shunt.  Operative note: After induction of anesthesia, patient positioned supine with his head turned to the left.  Patient's right occipital region neck chest and abdomen prepped and draped sterilely.  Incision made paramedian in his epigastric region.  This carried down sharply to the anterior rectus fascia.  Anterior rectus fascia was split.  Muscle fibers divided.  Posterior rectus fascia was elevated and incised.  Blunt access was then gained to the peritoneal cavity.  This was directly visualized.  Attention then placed to the cranial region.  A right occipital incision was made.  A right occipital burr hole was made.  Dura was coagulated.  A pocket was made inferior to the incision for shunt valve placement.  Shunt passer was then passed from the cranial wound to the abdominal wound.  A Medtronic strata 2 valve set at level 1.0 was then passed from the cranial wound to the abdominal wound.  A 9 cm ventricular catheter was then passed to the lateral ventricle with good return of clear CSF under pressure.  This is then attached to the proximal snap connector of the shunt system.  Good flow was observed through the distal end of the catheter.  The distal catheter was then placed into the abdominal cavity.  Wounds were then closed in layers with Vicryl sutures.  Staples were placed at the surface of the occipital wound.  Steri-Strips and sterile dressing  were placed on the abdominal wound.  No apparent complications.  Patient tolerated procedure well and he returns to the recovery room postop.

## 2023-03-13 NOTE — Transfer of Care (Signed)
Immediate Anesthesia Transfer of Care Note  Patient: Micheal Ayers  Procedure(s) Performed: Shunt Placment - right occipital (Right)  Patient Location: PACU  Anesthesia Type:General  Level of Consciousness: awake and patient cooperative  Airway & Oxygen Therapy: Patient Spontanous Breathing and Patient connected to face mask oxygen  Post-op Assessment: Report given to RN, Post -op Vital signs reviewed and stable, and Patient moving all extremities  Post vital signs: Reviewed and stable  Last Vitals:  Vitals Value Taken Time  BP 145/95 03/13/23 1000  Temp    Pulse 76 03/13/23 1000  Resp    SpO2 98 % 03/13/23 1000  Vitals shown include unvalidated device data.  Last Pain:  Vitals:   03/13/23 0624  TempSrc:   PainSc: 0-No pain      Patients Stated Pain Goal: 0 (03/13/23 2956)  Complications: No notable events documented.

## 2023-03-13 NOTE — Anesthesia Procedure Notes (Signed)
Procedure Name: Intubation Date/Time: 03/13/2023 8:23 AM  Performed by: Wendi Snipes, CRNAPre-anesthesia Checklist: Patient identified, Emergency Drugs available, Suction available and Patient being monitored Patient Re-evaluated:Patient Re-evaluated prior to induction Oxygen Delivery Method: Circle System Utilized Preoxygenation: Pre-oxygenation with 100% oxygen Induction Type: IV induction Ventilation: Mask ventilation without difficulty Laryngoscope Size: Mac and 4 Grade View: Grade I Tube type: Oral Tube size: 7.5 mm Number of attempts: 1 Airway Equipment and Method: Stylet and Oral airway Placement Confirmation: ETT inserted through vocal cords under direct vision, positive ETCO2 and breath sounds checked- equal and bilateral Secured at: 23 cm Tube secured with: Tape Dental Injury: Teeth and Oropharynx as per pre-operative assessment

## 2023-03-13 NOTE — H&P (Signed)
Micheal Ayers is an 62 y.o. male.   Chief Complaint: Hydrocephalus HPI: 62 year old male with progressive difficulty with balance and coordination and increasing memory issues.  Workup demonstrates evidence of marked ventriculomegaly consistent with chronic aqueduct of stenosis.  Patient underwent diagnostic and therapeutic lumbar puncture.  He improved significantly with CSF drainage.  I discussed situation with him.  He presents now for VP shunting in hopes of improving his situation.  Past Medical History:  Diagnosis Date   ADD (attention deficit disorder)    Allergic rhinitis, cause unspecified    Chronic kidney disease    Acute renal failure   Hypertension    Stroke     Past Surgical History:  Procedure Laterality Date   elbow surgury  1969   left elbow   INGUINAL HERNIA REPAIR  2003   left    Family History  Problem Relation Age of Onset   Hypertension Other    Alcohol abuse Other    Hypertension Other    Kidney disease Other    Cancer Other    Alcohol abuse Other    Arthritis Other    Stroke Other    Hypertension Other    Cancer Other    Social History:  reports that he has quit smoking. He has never used smokeless tobacco. He reports current alcohol use. He reports that he does not use drugs.  Allergies:  Allergies  Allergen Reactions   Erythromycin Hives    Medications Prior to Admission  Medication Sig Dispense Refill   aspirin EC 81 MG tablet Take 1 tablet (81 mg total) by mouth daily. Swallow whole. 30 tablet 12   atomoxetine (STRATTERA) 60 MG capsule Take 1 capsule (60 mg total) by mouth daily. 30 capsule 5   Cholecalciferol (VITAMIN D3) 50 MCG (2000 UT) CAPS Take 2,000 Units by mouth daily.     clopidogrel (PLAVIX) 75 MG tablet Take 1 tablet (75 mg total) by mouth daily for 21 days. 21 tablet 0   cyanocobalamin (VITAMIN B12) 1000 MCG tablet Take 1,000 mcg by mouth daily.     omeprazole (PRILOSEC OTC) 20 MG tablet Take 20 mg by mouth daily.      rosuvastatin (CRESTOR) 10 MG tablet Take 1 tablet (10 mg total) by mouth daily. 30 tablet 2   valsartan-hydrochlorothiazide (DIOVAN-HCT) 80-12.5 MG tablet Take 1 tablet by mouth daily.      No results found for this or any previous visit (from the past 48 hour(s)). No results found.  Pertinent items noted in HPI and remainder of comprehensive ROS otherwise negative.  Blood pressure (!) 144/99, pulse 96, temperature 97.6 F (36.4 C), temperature source Oral, resp. rate 20, height 6' (1.829 m), weight 102.1 kg, SpO2 93 %.  Patient is awake and alert.  He is oriented and reasonably appropriate.  Speech is fluent.  Judgment insight are intact.  Cranial nerve function normal bilateral.  Motor examination 5/5 bilaterally sensory examination nonfocal.  Gait unsteady.  Posterior reasonably normal.  Examination head demonstrates evidence of significant frontal bossing and some generalized cranial vault enlargement.  Oropharynx nasopharynx and external auditory canals are normal.  Chest and abdomen are benign.  Extremities are free of major deformity. Assessment/Plan Patient with longstanding aqueductal stenosis from which she is beginning to decompensate.  I discussed situation with the patient.  We discussed observation versus intervention.  Patient wishes to move forward with a right occipital VP shunt placement in hopes of improving his symptoms.  Risks and benefits of explained.  Patient wishes to proceed.  [name] 03/13/2023, 8:03 AM

## 2023-03-14 ENCOUNTER — Encounter (HOSPITAL_COMMUNITY): Payer: Self-pay | Admitting: Neurosurgery

## 2023-03-14 ENCOUNTER — Ambulatory Visit: Payer: 59 | Admitting: Physical Therapy

## 2023-03-14 ENCOUNTER — Ambulatory Visit: Payer: 59

## 2023-03-14 MED ORDER — TRAMADOL HCL 50 MG PO TABS: 50.0000 mg | ORAL_TABLET | Freq: Four times a day (QID) | ORAL | 0 refills | Status: DC | PRN

## 2023-03-14 MED FILL — Thrombin For Soln 5000 Unit: CUTANEOUS | Qty: 2 | Status: AC

## 2023-03-14 NOTE — Discharge Summary (Signed)
Physician Discharge Summary  Patient ID: Micheal Ayers MRN: [id number] DOB/AGE: 1961-08-18 62 y.o.  Admit date: 03/13/2023 Discharge date: 03/14/2023  Admission Diagnoses:  Discharge Diagnoses:  Principal Problem:   Obstructive hydrocephalus   Discharged Condition: good  Hospital Course: Patient admitted to the hospital where he underwent uncomplicated VP shunting for treatment of his obstructive hydrocephalus.  Postoperatively doing well.  Standing ambulating and voiding without difficulty.  Minimal pain.  Tolerating regular diet.  Ready for discharge home.  Consults:   Significant Diagnostic Studies:   Treatments:   Discharge Exam: Blood pressure (!) 135/90, pulse 87, temperature 97.6 F (36.4 C), temperature source Oral, resp. rate 17, height 6' (1.829 m), weight 102.1 kg, SpO2 98 %. Awake and alert.  Oriented and appropriate.  Motor and sensory function intact.  Wound clean and dry.  Chest and abdomen benign. Disposition:  There are no questions and answers to display.         Allergies as of 03/14/2023       Reactions   Erythromycin Hives        Medication List     TAKE these medications    Aspirin Low Dose 81 MG tablet Generic drug: aspirin EC Take 1 tablet (81 mg total) by mouth daily. Swallow whole.   atomoxetine 60 MG capsule Commonly known as: STRATTERA Take 1 capsule (60 mg total) by mouth daily.   clopidogrel 75 MG tablet Commonly known as: Plavix Take 1 tablet (75 mg total) by mouth daily for 21 days.   cyanocobalamin 1000 MCG tablet Commonly known as: VITAMIN B12 Take 1,000 mcg by mouth daily.   PriLOSEC OTC 20 MG tablet Generic drug: omeprazole Take 20 mg by mouth daily.   rosuvastatin 10 MG tablet Commonly known as: CRESTOR Take 1 tablet (10 mg total) by mouth daily.   traMADol 50 MG tablet Commonly known as: Ultram Take 1 tablet (50 mg total) by mouth every 6 (six) hours as needed.   valsartan-hydrochlorothiazide 80-12.5  MG tablet Commonly known as: DIOVAN-HCT Take 1 tablet by mouth daily.   Vitamin D3 50 MCG (2000 UT) capsule Take 2,000 Units by mouth daily.         Signed: Kathaleen Maser Jamin Panther 03/14/2023, 8:55 AM

## 2023-03-14 NOTE — Anesthesia Postprocedure Evaluation (Signed)
Anesthesia Post Note  Patient: Micheal Ayers  Procedure(s) Performed: Shunt Placment - right occipital (Right)     Patient location during evaluation: PACU Anesthesia Type: General Level of consciousness: awake and alert Pain management: pain level controlled Vital Signs Assessment: post-procedure vital signs reviewed and stable Respiratory status: spontaneous breathing, nonlabored ventilation, respiratory function stable and patient connected to nasal cannula oxygen Cardiovascular status: blood pressure returned to baseline and stable Postop Assessment: no apparent nausea or vomiting Anesthetic complications: no   No notable events documented.  Last Vitals:  Vitals:   03/14/23 0326 03/14/23 0758  BP: (!) 145/72 (!) 135/90  Pulse: 91 87  Resp: 18 17  Temp: 36.6 C 36.4 C  SpO2: 93% 98%    Last Pain:  Vitals:   03/14/23 0758  TempSrc: Oral  PainSc:                  [name]

## 2023-03-14 NOTE — Discharge Instructions (Signed)

## 2023-03-14 NOTE — Progress Notes (Signed)
Pt. discharged home accompanied by wife. Prescriptions and discharge instructions given with verbalization of understanding. Incision site on back of head with no s/s of infection - no swelling, redness, bleeding, and/or drainage noted. Pt. transported out of this unit in wheelchair by the volunteer

## 2023-03-21 ENCOUNTER — Ambulatory Visit: Payer: 59

## 2023-03-21 ENCOUNTER — Encounter: Payer: Self-pay | Admitting: Physical Therapy

## 2023-03-21 ENCOUNTER — Telehealth: Payer: Self-pay | Admitting: Adult Health

## 2023-03-21 ENCOUNTER — Telehealth: Payer: Self-pay | Admitting: Physical Therapy

## 2023-03-21 ENCOUNTER — Ambulatory Visit: Payer: 59 | Admitting: Physical Therapy

## 2023-03-21 DIAGNOSIS — R2689 Other abnormalities of gait and mobility: Secondary | ICD-10-CM

## 2023-03-21 DIAGNOSIS — I639 Cerebral infarction, unspecified: Secondary | ICD-10-CM | POA: Diagnosis not present

## 2023-03-21 DIAGNOSIS — M6281 Muscle weakness (generalized): Secondary | ICD-10-CM | POA: Diagnosis present

## 2023-03-21 DIAGNOSIS — R2681 Unsteadiness on feet: Secondary | ICD-10-CM

## 2023-03-21 DIAGNOSIS — R41841 Cognitive communication deficit: Secondary | ICD-10-CM

## 2023-03-21 NOTE — Patient Instructions (Signed)
   Do functional tasks that will work on your attention:  Organize a Management consultant your closet/one wall of your closet or a Therapist, nutritional app or notes app to save gb Plan dinner meals for the week and then write the grocery list Make a Netflix "want to watch" list  If you are losing attention after 15 minutes, set your timer for 20 minutes.

## 2023-03-21 NOTE — Therapy (Signed)
OUTPATIENT PHYSICAL THERAPY NEURO TREATMENT   Patient Name: Micheal Ayers MRN: [id number] DOB:Jan 07, 1961, 62 y.o., male Today's Date: 03/21/2023   PCP: Mila Palmer, MD REFERRING PROVIDER: Rolly Salter, MD (Will send to Dr. Paulino Rily)  END OF SESSION:  PT End of Session - 03/21/23 0818     Visit Number 3    Number of Visits 13    Date for PT Re-Evaluation 04/13/23    Authorization Type [employer]    PT Start Time 0818    PT Stop Time 0842    PT Time Calculation (min) 24 min    Activity Tolerance Patient tolerated treatment well    Behavior During Therapy [facility] for tasks assessed/performed             Past Medical History:  Diagnosis Date   ADD (attention deficit disorder)    Allergic rhinitis, cause unspecified    Chronic kidney disease    Acute renal failure   Hypertension    Stroke    Past Surgical History:  Procedure Laterality Date   elbow surgury  1969   left elbow   INGUINAL HERNIA REPAIR  2003   left   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/2023   Procedure: Shunt Placment - right occipital;  Surgeon: Julio Sicks, MD;  Location: [facility] OR;  Service: Neurosurgery;  Laterality: Right;   Patient Active Problem List   Diagnosis Date Noted   Obstructive hydrocephalus 03/13/2023   Ischemic cerebrovascular accident (CVA) 02/19/2023   Cerebral ventriculomegaly 02/19/2023   Acute renal failure superimposed on stage 2 chronic kidney disease 02/19/2023   Hypertensive urgency 02/19/2023   Abnormal CXR 02/19/2023   CVA (cerebral vascular accident) 02/19/2023   Fatigue 09/20/2012   Family history of hemochromatosis 09/20/2012   Preventative health care 09/06/2012   ADD (attention deficit disorder)    Allergic rhinitis, cause unspecified     ONSET DATE: 02/18/2023  REFERRING DIAG: I63.9 (ICD-10-CM) - Acute CVA (cerebrovascular accident)  THERAPY DIAG:  Other abnormalities of gait and mobility  Unsteadiness on feet  Muscle weakness (generalized)  Rationale for  Evaluation and Treatment: Rehabilitation  SUBJECTIVE:                                                                                                                                                                                             SUBJECTIVE STATEMENT: Feel better already-was surprised, after the shunt placement last week. Pt accompanied by: self  PERTINENT HISTORY: PMH:  NPH, HTN, HLD, obesity, anxiety  PAIN:  Are you having pain? No  PRECAUTIONS: Fall Per MD note Following shunt placement: No lifting >5#, avoid excess motion of  neck; no driving x 2 weeks  WEIGHT BEARING RESTRICTIONS: No  FALLS: Has patient fallen in last 6 months? No  LIVING ENVIRONMENT: Lives with: lives with their spouse Lives in: House/apartment Stairs: Yes: Internal: 3 steps; on right going up and External: 12 steps; on right going up Has following equipment at home: None  PLOF: Independent; works two jobs  PATIENT GOALS: Pt's goal for therapy is to get back to normal, to do things without having to think about them.  OBJECTIVE:    TODAY'S TREATMENT: 03/21/2023 Activity Comments  Vitals:  120/101 HR 101   Reviewed walking program for home   139/98, HR 95   3 minutes of gait in clinic, no device RPE 12, decreased LLE foot clearance towards end of gait         PATIENT EDUCATION: Education details: Hold current HEP until following up with neurosurgeon and focus on walking program for HEP; signs/symptoms of CVA, BP measures/risk factors for CVA; follow up with Dr. Jordan Likes about headache behind R eye (on same side as shunt) Person educated: Patient Education method: Explanation Education comprehension: verbalized understanding    Access Code: [email or web address] URL: [email or web address] Date: 03/06/2023 Prepared by: [facility] - Outpatient  Rehab - [facility]  Program Notes Walk for 3-4 minutes, in your home, 3 times daily.  Make sure to stand tall, clear your feet with  walking.  Exercises - Single Leg Stance with Support  - 1 x daily - 7 x weekly - 1 sets - 3 reps - 15 sec hold - Narrow Stance with Counter Support  - 1 x daily - 7 x weekly - 1 sets - 3 reps - 30 sec hold - March in Place  - 1 x daily - 3 x weekly - 3 sets - 10 reps - Standing Hip Abduction with Counter Support  - 1 x daily - 3 x weekly - 3 sets - 10 reps - Standing Hip Extension with Counter Support  - 1 x daily - 3 x weekly - 3 sets - 10 reps   --------------------------------------------------------------- Objective measures below taken at initial evaluation:  DIAGNOSTIC FINDINGS: MRI shows acute CVA; Ventriculomegaly on brain imaging. 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 Request for diagnostic lumbar puncture.  COGNITION: Overall cognitive status:  Slowed answering questions; pt reports at times, wife had difficulty understanding him in the past several weeks.   Vitals:  137/108  HR 93 bpm After approx 5 minutes- 127/99 HR 91 bpm   SENSATION: WFL  COORDINATION: Slightly slowed heel to shin LLE  MUSCLE TONE: LLE: Mild   POSTURE: No Significant postural limitations  LOWER EXTREMITY ROM:   AROM WFL  LOWER EXTREMITY MMT:  Not formally assessed, but at least 3/5 BLEs  MMT Right Eval Left Eval  Hip flexion    Hip extension    Hip abduction    Hip adduction    Hip internal rotation    Hip external rotation    Knee flexion    Knee extension    Ankle dorsiflexion    Ankle plantarflexion    Ankle inversion    Ankle eversion    (Blank rows = not tested)   TRANSFERS: Assistive device utilized: None  Sit to stand: Modified independence Stand to sit: Modified independence  GAIT: Gait pattern: step through pattern, decreased arm swing- Left, decreased step length- Left, and poor foot clearance- Left Distance walked: 100 ft Assistive device utilized: None Level of assistance: Modified independence Comments: decr L foot clearance  FUNCTIONAL TESTS:   Timed up and go (TUG): 12.62 10 meter walk test: 10.35 sec = 3.09 ft/sec SLS:  R 5.31, L 1.75 sec M-CTSIB  Condition 1: Firm Surface, EO 30 Sec, Normal Sway  Condition 2: Firm Surface, EC 30 Sec, Mild Sway  Condition 3: Foam Surface, EO 30 Sec, Mild and Moderate Sway  Condition 4: Foam Surface, EC 10.31 Sec, Severe Sway    PATIENT SURVEYS:  FOTO Intake 63; predicted 77  TODAY'S TREATMENT:                                                                                                                              DATE: 02/27/2023    PATIENT EDUCATION: Education details: PT eval results, POC, initiated HEP Person educated: Patient Education method: Explanation, Demonstration, and Handouts Education comprehension: verbalized understanding and needs further education  HOME EXERCISE PROGRAM: Access Code: [email or web address] URL: [email or web address] Date: 02/27/2023 Prepared by: [facility] - Outpatient  Rehab - [facility]  Exercises - Single Leg Stance with Support  - 1 x daily - 7 x weekly - 1 sets - 3 reps - 15 sec hold - Narrow Stance with Counter Support  - 1 x daily - 7 x weekly - 1 sets - 3 reps - 30 sec hold  GOALS: Goals reviewed with patient? Yes  SHORT TERM GOALS: Target date: 03/16/2023  Pt will be independent with HEP for improved balance, gait.  Baseline: Goal status: IN PROGRESS  LONG TERM GOALS: Target date: 04/13/2023  Pt will be independent with HEP for improved gait and balance. Baseline:  Goal status: IN PROGRESS  2.  Pt will improve SLS on LLE to at least 3 sec for improved balance for stair and obstacle negotiation. Baseline: 1.75 sec L, 5.31 sec R Goal status: IN PROGRESS  3.  Pt will improve Condition 4 on MCTSIB to at least 30 sec with moderate or less sway to show improved balance. Baseline: 10 sec severe sway Goal status: IN PROGRESS  4.  Pt will improve FOTO score to at least 77 to demonstrate improved overall functional  mobility. Baseline: 63 Goal status: IN PROGRESS  ASSESSMENT:  CLINICAL IMPRESSION: Pt returns today for OPPT, undergoing R side shunt placement since last PT session.  MD notes and Dr. Lindalou Hose telephone encounter indicate pt is able to resume PT without restrictions.  Pt does report that he notes improved balance since surgery.  Skilled session shortened today-awaiting MD clearance following surgery.  Education provided today for walking program, monitoring BP and follow up with his PCP.  He will continue to benefit from skilled PT towards goals for improved overall functional mobility and return to independent PLOF.  OBJECTIVE IMPAIRMENTS: Abnormal gait, decreased balance, decreased mobility, difficulty walking, and decreased strength.   ACTIVITY LIMITATIONS: standing and locomotion level  PARTICIPATION LIMITATIONS: community activity and occupation  PERSONAL FACTORS: HTN, HLD, obesity, anxiety, ADD are also affecting patient's functional outcome.   REHAB POTENTIAL:  Good  CLINICAL DECISION MAKING: Evolving/moderate complexity  EVALUATION COMPLEXITY: Moderate  PLAN:  PT FREQUENCY: 2x/week  PT DURATION: 6 weeks  PLANNED INTERVENTIONS: Therapeutic exercises, Therapeutic activity, Neuromuscular re-education, Balance training, Gait training, Patient/Family education, Self Care, and Vestibular training  PLAN FOR NEXT SESSION: Review additions to HEP and progress HEP for balance, LLE strength and SLS.  Dynamic balance, attention to L side.  Check about progression of walking program.   Gean Maidens., PT 03/21/2023, 10:03 PM  [facility] at [facility] [address] [place], [address] [place], [place] [address] Phone # [phone] Fax # [phone]

## 2023-03-21 NOTE — Telephone Encounter (Signed)
Patient had a stroke and is undergoing OT and ST. He said his ST asked if his Strattera dose could be increased from 60 mg. Therapist felt it would be helpful in helping with memory retention.  I know it isn't controlled, but let me know and I'll pend. F/U is 8/8. Will ask admin to schedule earlier appt.

## 2023-03-21 NOTE — Therapy (Unsigned)
OUTPATIENT SPEECH LANGUAGE PATHOLOGY TREATMENT   Patient Name: Micheal Ayers MRN: [id number] DOB:09-09-1961, 62 y.o., male Today's Date: 03/21/2023  PCP: Mila Palmer, MD REFERRING PROVIDER: Rolly Salter, MD Mila Palmer, MD (documentation))  END OF SESSION:  End of Session - 03/21/23 0857     Visit Number 3    Number of Visits 9    Date for SLP Re-Evaluation 05/05/23    SLP Start Time 0851    SLP Stop Time  0930    SLP Time Calculation (min) 39 min    Activity Tolerance Patient tolerated treatment well             Past Medical History:  Diagnosis Date   ADD (attention deficit disorder)    Allergic rhinitis, cause unspecified    Chronic kidney disease    Acute renal failure   Hypertension    Stroke    Past Surgical History:  Procedure Laterality Date   elbow surgury  1969   left elbow   INGUINAL HERNIA REPAIR  2003   left   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   Procedure: Shunt Placment - right occipital;  Surgeon: Julio Sicks, MD;  Location: [facility] OR;  Service: Neurosurgery;  Laterality: Right;   Patient Active Problem List   Diagnosis Date Noted   Obstructive hydrocephalus 03/13/2023   Ischemic cerebrovascular accident (CVA) 02/19/2023   Cerebral ventriculomegaly 02/19/2023   Acute renal failure superimposed on stage 2 chronic kidney disease 02/19/2023   Hypertensive urgency 02/19/2023   Abnormal CXR 02/19/2023   CVA (cerebral vascular accident) 02/19/2023   Fatigue 09/20/2012   Family history of hemochromatosis 09/20/2012   Preventative health care 09/06/2012   ADD (attention deficit disorder)    Allergic rhinitis, cause unspecified     ONSET DATE: 02/18/23   REFERRING DIAG:  Diagnosis  I63.9 (ICD-10-CM) - Acute CVA     THERAPY DIAG:  Cognitive communication deficit  Rationale for Evaluation and Treatment: Rehabilitation  SUBJECTIVE:   SUBJECTIVE STATEMENT: Pt had VP shunt sx 03/12/23.Resume orders rec'd for PT.  Pt  accompanied by: self  PERTINENT HISTORY: HPI: Pt is a 62 y.o. M who presented 02/18/23 with slurred speech and balance difficulty. MRI brian 3/24: Small acute to early subacute small vessel infarcts involving the subcortical white matter of the anterior right frontal corona radiata and right thalamus. Pt has appointment to discuss possible shunt placement for presence of NPH, next week. PMH: ADD.  PAIN:  Are you having pain? Yes: NPRS scale: 2/10 Pain location: head Pain description: headache Aggravating factors: IDK Relieving factors: IDK  PATIENT GOALS: Improve memory  OBJECTIVE:   DIAGNOSTIC FINDINGS:  MR without contrast 02/18/23: IMPRESSION: 1. Small acute to early subacute small vessel infarcts involving the subcortical white matter of the anterior right frontal corona radiata and right thalamus. No associated hemorrhage or mass effect. 2. Marked lateral and third ventriculomegaly, somewhat out of proportion to overlying cortical sulcation. Clinical correlation for possible in pH is advised. Possible underlying cerebral aqueductal stenosis would be the primary differential consideration. No significant transependymal flow of CSF, suggesting at this is a chronic and compensated finding. 3. Underlying mild chronic microvascular ischemic disease. Small remote left cerebellar infarct.  Speech-Language Eval 02-19-23: Pt participated in speech-language-cognition evaluation. 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 He reported that he is on medication for ADD, but still has some difficulty attending at baseline. Pt reported that he  has recently noticed that he has been having difficulty counting money at work and that he has been forgetting whether he has completed tasks. He reported that his speech is now "slurred" and that it is now approximately 75% back to baseline. The [facility] Mental  Status Examination was completed to evaluate the pt's cognitive-linguistic skills. He achieved a score of 28/30 which is within the normal limits of 27 or more out of 30. However, pt stated that he believes his cognition and processing speed are slower. Some difficulty was noted with memory and tasks which required executive functions; self-correction and additional processing time were often necessary for accuracy during these tasks. Pt also presented with mild dysarthria characterized by reduced articulatory precision and a reduced ability to adequately coordinate respiration with speech at the conversational level. Speech intelligibility was occasionally impacted during conversation. Pt became tearful towards the end of the evaluation and expressed that he was having difficulty emotionally processing everything; pt verbalized agreement with spiritual care consult and Ty, RN advised that she will place the order. Skilled SLP services are clinically indicated at this time to improve motor speech and cognitive-linguistic function.   PATIENT REPORTED OUTCOME MEASURES (PROM): Cognitive Function: (long form) provided today and pt to return next visit.   TODAY'S TREATMENT:                                                                                                                                         DATE:  03/21/23: Pt states his memory is better since last session, however he still thinks attention is slightly depressed compared to prior to CVA. He is currently on Strattera  and SLP suggested talking with his referring MD for Strattera if it would be appropriate for him to have a dosage increase. Pt req'd cues for putting this and a reminder to call neurosurgeon about headaches into his phone.  03/06/23: Pt has taken his meds without an alarm in the past week and been 100% successful. He has not gotten the planner yet but is going to today. He is using other memory compensations such as taking  pictures. SLP encouraged planner usage once pt obtains planner. SLP reviewed pt's goals with him and he agreed that they are appropriate.  Pt to return to work next week and SLP and pt discussed some ways to make this transition easier using compensations. Pt reminded SLP he is dx'd with ADD prior to CVA and did not always have the best habits with memory compensations.  02/27/23: Today pt and SLP reviewed keeping a planner and putting alarms in his phone for his meds; SLP also explained NO STOPPING alarms until he has COMPLETED the thing the alarm is set for. Pt acknowledged understanding. SLP performed diagnostic therapy and pt wrote down the list as much as possible and he recalled 11/12 and told SLP he could have remembered one more  if his writing were more legible.   PATIENT EDUCATION: Education details: see "today's treatment" Person educated: Patient Education method: Explanation, Demonstration, Verbal cues, and Handouts Education comprehension: verbalized understanding, returned demonstration, verbal cues required, and needs further education   GOALS: Goals reviewed with patient? Yes  SHORT TERM GOALS: Target date: 04/01/23  Pt will utilize at least 1 compensation for memory during the day in 3 sessions Baseline:03/06/23 Goal status: Ongoing  2.  Pt will report 100% compliance with meds and appointments with modified independence for 1 week Baseline:  Goal status: Met  3.  Pt will indicate on-time payment of all bills for one bill cycle with mod I (with use of compensations) Baseline:  Goal status: Ongoing    LONG TERM GOALS: Target date: 05/05/23  Pt will utilize at least 2 compensations for memory during  his workday  Baseline:  Goal status: Ongoing  2.  Pt will report 100% compliance with meds and appointments with modified independent for 2 weeks Baseline:  Goal status: Ongoing  3.  Pt will indicate on-time payment of all bills for one bill cycle  after 04/01/23 with mod I  (with use of compensations) Baseline:  Goal status: Ongoing  4.  Pt will score better than initial administration on PROM Baseline:  Goal status: Ongoing  ASSESSMENT:  CLINICAL IMPRESSION: Patient is a 62 y.o. male who was seen today for treatment of speech and memory skills following CVAs 02/18/23. Pt works as an Publishing copy at a [employer] and needs to recall details to complete his job, and he is returning next week. Prior to CVA pt reports he was not adequately compensating for his ADD and would miss bill due dates and medications. Pt is currently using the schedule he got at discharge to track appointments but endorses this would no longer be adequate when he makes appointments for therapy today. He has not purchased a Pensions consultant for calendar and important notes and to-do's but tells SLP he is going today to do so. SLP is unsure pt will be able to withstand a full day of work a little over 2 weeks after his CVA.   OBJECTIVE IMPAIRMENTS: include memory. These impairments are limiting patient from return to work, managing medications, managing appointments, managing finances, household responsibilities, ADLs/IADLs, and effectively communicating at home and in community. Factors affecting potential to achieve goals and functional outcome are previous level of function.. Patient will benefit from skilled SLP services to address above impairments and improve overall function.  REHAB POTENTIAL: Good  PLAN:  SLP FREQUENCY: 1x/week  SLP DURATION: 8 weeks  PLANNED INTERVENTIONS: Environmental controls, Cognitive reorganization, Internal/external aids, Functional tasks, SLP instruction and feedback, Compensatory strategies, and Patient/family education    [facility], CCC-SLP 03/21/2023, 8:59 AM

## 2023-03-21 NOTE — Telephone Encounter (Signed)
Good morning, Micheal Ayers has undergone shunt placement since last seen by OPPT.  Please clarify if there are any restrictions or precautions; please send return to PT order.  Domenic Moras, PT 03/21/23 7:55 AM Phone: [phone] Fax: [phone]

## 2023-03-21 NOTE — Telephone Encounter (Signed)
Pt LVM @ 1:10p.  He had a stroke and he has a question about his medications.  Next appt 8/8

## 2023-03-22 NOTE — Telephone Encounter (Signed)
Please call to schedule an earlier appt with Almira Coaster to discuss his request to increase Strattera dose.

## 2023-03-22 NOTE — Telephone Encounter (Signed)
Would like to discuss with patient.

## 2023-03-27 ENCOUNTER — Ambulatory Visit: Payer: 59

## 2023-03-27 ENCOUNTER — Ambulatory Visit: Payer: 59 | Admitting: Physical Therapy

## 2023-03-28 ENCOUNTER — Encounter: Payer: Self-pay | Admitting: Physical Therapy

## 2023-03-28 ENCOUNTER — Ambulatory Visit: Payer: 59

## 2023-03-28 ENCOUNTER — Ambulatory Visit: Payer: 59 | Attending: Hematology and Oncology | Admitting: Physical Therapy

## 2023-03-28 DIAGNOSIS — R2689 Other abnormalities of gait and mobility: Secondary | ICD-10-CM | POA: Diagnosis present

## 2023-03-28 DIAGNOSIS — M6281 Muscle weakness (generalized): Secondary | ICD-10-CM | POA: Insufficient documentation

## 2023-03-28 DIAGNOSIS — R2681 Unsteadiness on feet: Secondary | ICD-10-CM | POA: Insufficient documentation

## 2023-03-28 DIAGNOSIS — R41841 Cognitive communication deficit: Secondary | ICD-10-CM

## 2023-03-28 NOTE — Therapy (Signed)
[place] [facility] [address] [place], [place], [address] Phone: [phone]   Fax:  [phone]  Patient Details  Name: Micheal Ayers MRN: [id number] Date of Birth: 09-Apr-1961 Referring Provider:  Mila Palmer, MD  Encounter Date: 03/28/2023  Pt arrived at PT session today, late due to worker installing oven at home.  Assessed BP measures:   03/28/2023 Activity Comments  Vitals:129/103 HR 96    2nd trial:  134/109 HR 95 bpm                Pt denies any symptoms of headache, blurred vision, just reports being stressed out due to being late.  Advised pt to follow up with MD for BP management prior to returning to PT.  Francella Barnett W., PT 03/28/2023, 3:18 PM  [place] [facility] [address] [place], [place], [address] Phone: [phone]   Fax:  [phone]

## 2023-03-28 NOTE — Therapy (Signed)
OUTPATIENT SPEECH LANGUAGE PATHOLOGY TREATMENT   Patient Name: Micheal Ayers MRN: [id number] DOB:12/15/1960, 62 y.o., male Today's Date: 03/28/2023  PCP: Mila Palmer, MD REFERRING PROVIDER: Rolly Salter, MD Mila Palmer, MD (documentation))  END OF SESSION:  End of Session - 03/28/23 1532     Visit Number 4    Number of Visits 9    Date for SLP Re-Evaluation 05/05/23    SLP Start Time 1527    SLP Stop Time  1610    SLP Time Calculation (min) 43 min    Activity Tolerance Patient tolerated treatment well             Past Medical History:  Diagnosis Date   ADD (attention deficit disorder)    Allergic rhinitis, cause unspecified    Chronic kidney disease    Acute renal failure   Hypertension    Stroke [facility])    Past Surgical History:  Procedure Laterality Date   elbow surgury  1969   left elbow   INGUINAL HERNIA REPAIR  2003   left   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/2023   Procedure: Shunt Placment - right occipital;  Surgeon: Julio Sicks, MD;  Location: [facility] OR;  Service: Neurosurgery;  Laterality: Right;   Patient Active Problem List   Diagnosis Date Noted   Obstructive hydrocephalus (HCC) 03/13/2023   Ischemic cerebrovascular accident (CVA) (HCC) 02/19/2023   Cerebral ventriculomegaly 02/19/2023   Acute renal failure superimposed on stage 2 chronic kidney disease (HCC) 02/19/2023   Hypertensive urgency 02/19/2023   Abnormal CXR 02/19/2023   CVA (cerebral vascular accident) (HCC) 02/19/2023   Fatigue 09/20/2012   Family history of hemochromatosis 09/20/2012   Preventative health care 09/06/2012   ADD (attention deficit disorder)    Allergic rhinitis, cause unspecified     ONSET DATE: 02/18/23   REFERRING DIAG:  Diagnosis  I63.9 (ICD-10-CM) - Acute CVA     THERAPY DIAG:  Cognitive communication deficit  Rationale for Evaluation and Treatment: Rehabilitation  SUBJECTIVE:   SUBJECTIVE STATEMENT: Pt's neurosurgeon cleared pt back to  work - starts Monday.  Pt accompanied by: self  PERTINENT HISTORY: HPI: Pt is a 62 y.o. M who presented 02/18/23 with slurred speech and balance difficulty. MRI brian 3/24: Small acute to early subacute small vessel infarcts involving the subcortical white matter of the anterior right frontal corona radiata and right thalamus. Pt has appointment to discuss possible shunt placement for presence of NPH, next week. PMH: ADD.  PAIN:  Are you having pain? Yes: NPRS scale: 2/10 Pain location: head Pain description: headache Aggravating factors: IDK Relieving factors: IDK  PATIENT GOALS: Improve memory  OBJECTIVE:   DIAGNOSTIC FINDINGS:  MR without contrast 02/18/23: IMPRESSION: 1. Small acute to early subacute small vessel infarcts involving the subcortical white matter of the anterior right frontal corona radiata and right thalamus. No associated hemorrhage or mass effect. 2. Marked lateral and third ventriculomegaly, somewhat out of proportion to overlying cortical sulcation. Clinical correlation for possible in pH is advised. Possible underlying cerebral aqueductal stenosis would be the primary differential consideration. No significant transependymal flow of CSF, suggesting at this is a chronic and compensated finding. 3. Underlying mild chronic microvascular ischemic disease. Small remote left cerebellar infarct.  Speech-Language Eval 02-19-23: Pt participated in speech-language-cognition evaluation. 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 He reported that he is on medication for ADD, but still has some difficulty attending at  baseline. Pt reported that he has recently noticed that he has been having difficulty counting money at work and that he has been forgetting whether he has completed tasks. He reported that his speech is now "slurred" and that it is now approximately 75% back to baseline. The  [facility] Mental Status Examination was completed to evaluate the pt's cognitive-linguistic skills. He achieved a score of 28/30 which is within the normal limits of 27 or more out of 30. However, pt stated that he believes his cognition and processing speed are slower. Some difficulty was noted with memory and tasks which required executive functions; self-correction and additional processing time were often necessary for accuracy during these tasks. Pt also presented with mild dysarthria characterized by reduced articulatory precision and a reduced ability to adequately coordinate respiration with speech at the conversational level. Speech intelligibility was occasionally impacted during conversation. Pt became tearful towards the end of the evaluation and expressed that he was having difficulty emotionally processing everything; pt verbalized agreement with spiritual care consult and Ty, RN advised that she will place the order. Skilled SLP services are clinically indicated at this time to improve motor speech and cognitive-linguistic function.   PATIENT REPORTED OUTCOME MEASURES (PROM): Cognitive Function: (long form) provided today and pt to return next visit.   TODAY'S TREATMENT:                                                                                                                                         DATE:  03/28/23: Pt arrived today 15 minutes late due to oven installer arriving 60 minutes late. Pt keeping track of appointments on his planner. Has not skipped an appointment.  Pt will track tasks differently at work - listing items on a paper list near computer, and in computer for results of lists (expected delivery date/s, e.g.). SLP and pt discussed energy conservation by planning trips in the warehouse to get as much as possible in one trip, asking others to pick up things for him when they are returning back by him, etc. Pt meds still independent without any skips and  refills are going WNL. Pt will ask Dr. Paulino Rily about any necessary refills when stopping today to ask about BP (elevated diastolic).   03/21/23: Pt states his memory is better since last session, however he still thinks attention is slightly depressed compared to prior to CVA. He is currently on Strattera 60mg  and SLP suggested talking with his referring MD for Strattera if it would be appropriate for him to have a dosage increase. Pt req'd cues for putting this and a reminder to call neurosurgeon about headaches into his phone.  03/06/23: Pt has taken his meds without an alarm in the past week and been 100% successful. He has not gotten the planner yet but is going to today. He is using other memory compensations such as taking pictures. SLP  encouraged planner usage once pt obtains planner. SLP reviewed pt's goals with him and he agreed that they are appropriate.  Pt to return to work next week and SLP and pt discussed some ways to make this transition easier using compensations. Pt reminded SLP he is dx'd with ADD prior to CVA and did not always have the best habits with memory compensations.  02/27/23: Today pt and SLP reviewed keeping a planner and putting alarms in his phone for his meds; SLP also explained NO STOPPING alarms until he has COMPLETED the thing the alarm is set for. Pt acknowledged understanding. SLP performed diagnostic therapy and pt wrote down the list as much as possible and he recalled 11/12 and told SLP he could have remembered one more if his writing were more legible.   PATIENT EDUCATION: Education details: see "today's treatment" Person educated: Patient Education method: Explanation, Demonstration, Verbal cues, and Handouts Education comprehension: verbalized understanding, returned demonstration, verbal cues required, and needs further education   GOALS: Goals reviewed with patient? Yes  SHORT TERM GOALS: Target date: 04/01/23  Pt will utilize at least 1 compensation for  memory during the day in 3 sessions Baseline:03/06/23, 03/28/23 Goal status: Ongoing  2.  Pt will report 100% compliance with meds and appointments with modified independence for 1 week Baseline:  Goal status: Met  3.  Pt will indicate on-time payment of all bills for one bill cycle with mod I (with use of compensations) Baseline:  Goal status: Met    LONG TERM GOALS: Target date: 05/05/23  Pt will utilize at least 2 compensations for memory during  his workday  Baseline:  Goal status: Ongoing  2.  Pt will report 100% compliance with meds and appointments with modified independent for 2 weeks Baseline:  Goal status: Ongoing  3.  Pt will indicate on-time payment of all bills for one bill cycle  after 04/01/23 with mod I (with use of compensations) Baseline:  Goal status: Ongoing  4.  Pt will score better than initial administration on PROM Baseline:  Goal status: Ongoing  ASSESSMENT:  CLINICAL IMPRESSION: Patient is a 62 y.o. male who was seen today for treatment of speech and memory skills following CVAs 02/18/23. 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 Prior to CVA pt reports he was not adequately compensating for his ADD and would miss bill due dates and medications. Pt is currently using the schedule he got at discharge to track appointments but endorses this would no longer be adequate when he makes appointments for therapy today.   OBJECTIVE IMPAIRMENTS: include memory. These impairments are limiting patient from return to work, managing medications, managing appointments, managing finances, household responsibilities, ADLs/IADLs, and effectively communicating at home and in community. Factors affecting potential to achieve goals and functional outcome are previous level of function.. Patient will benefit from skilled SLP services to address above impairments and improve  overall function.  REHAB POTENTIAL: Good  PLAN:  SLP FREQUENCY: 1x/week  SLP DURATION: 8 weeks  PLANNED INTERVENTIONS: Environmental controls, Cognitive reorganization, Internal/external aids, Functional tasks, SLP instruction and feedback, Compensatory strategies, and Patient/family education    [facility], CCC-SLP 03/28/2023, 3:33 PM

## 2023-04-03 ENCOUNTER — Ambulatory Visit: Payer: 59

## 2023-04-03 ENCOUNTER — Other Ambulatory Visit (HOSPITAL_COMMUNITY): Payer: Self-pay

## 2023-04-04 ENCOUNTER — Encounter: Payer: Self-pay | Admitting: Adult Health

## 2023-04-04 ENCOUNTER — Ambulatory Visit (INDEPENDENT_AMBULATORY_CARE_PROVIDER_SITE_OTHER): Payer: 59 | Admitting: Adult Health

## 2023-04-04 DIAGNOSIS — F902 Attention-deficit hyperactivity disorder, combined type: Secondary | ICD-10-CM | POA: Diagnosis not present

## 2023-04-04 MED ORDER — ATOMOXETINE HCL 80 MG PO CAPS: 80.0000 mg | ORAL_CAPSULE | Freq: Every day | ORAL | 5 refills | Status: DC

## 2023-04-04 NOTE — Progress Notes (Signed)
SELMAN PARZIALE [id number] 05/17/1961 62 y.o.  Subjective:   Patient ID:  Micheal Ayers is a 62 y.o. (DOB 07-Apr-1961) male.  Chief Complaint: No chief complaint on file.   HPI Micheal Ayers presents to the office today for follow-up of ADD.  Describes mood today as "ok". Denies tearfulness. Mood symptoms - reports some situational depression,  anxiety, and irritability. Denies worry, rumination, and irritability. Mood is consistent. Stating "I feel like I'm doing ok, not great". Reports recent hospitalization for 3 weeks with hydrocephalus - possibly 2 mini strokes. Has returned to work and is adjusting to previous schedule. Feels like the Stratera at 60mg  is helpful, but would like to try increasing dose to 80mg  daily. Varying interest and motivation. Taking medications as prescribed.  Energy levels lower. Active, does not have a regular exercise routine. Enjoys some usual interests and activities. Married. Lives with wife - 22 year old daughter. Sister in [place]. Spending time with family. Appetite decreased. Weight loss - 15 pounds. Sleeps well most nights. Averages 8 to 9 hours. Feels like a challenge to get up in the morning. Focus and concentration difficulties - recent hospitalization. Completing tasks. Managing aspects of household. Works full time - has been out of work for 6 weeks recovering from surgery. Denies SI or HI.  Denies AH or VH. Denies self harm. Denies substance use.  Previous medication trials:  Ritalin, Adderall    Flowsheet Row Admission (Discharged) from 03/13/20[facility] in [place] [facility] ED to Hosp-Admission (Discharged) from 02/18/2023 in [facility]  C-SSRS RISK CATEGORY No Risk No Risk        Review of Systems:  Review of Systems  Musculoskeletal:  Negative for gait problem.  Neurological:  Negative for tremors.  Psychiatric/Behavioral:         Please refer to HPI    Medications: I have reviewed  the patient's current medications.  Current Outpatient Medications  Medication Sig Dispense Refill   aspirin EC 81 MG tablet Take 1 tablet (81 mg total) by mouth daily. Swallow whole. 30 tablet 12   atomoxetine (STRATTERA) 60 MG capsule Take 1 capsule (60 mg total) by mouth daily. 30 capsule 5   Cholecalciferol (VITAMIN D3) 50 MCG (2000 UT) CAPS Take 2,000 Units by mouth daily.     cyanocobalamin (VITAMIN B12) 1000 MCG tablet Take 1,000 mcg by mouth daily.     omeprazole (PRILOSEC OTC) 20 MG tablet Take 20 mg by mouth daily.     rosuvastatin (CRESTOR) 10 MG tablet Take 1 tablet (10 mg total) by mouth daily. 30 tablet 2   traMADol (ULTRAM) 50 MG tablet Take 1 tablet (50 mg total) by mouth every 6 (six) hours as needed. 20 tablet 0   valsartan-hydrochlorothiazide (DIOVAN-HCT) 80-12.5 MG tablet Take 1 tablet by mouth daily.     No current facility-administered medications for this visit.    Medication Side Effects: None  Allergies:  Allergies  Allergen Reactions   Erythromycin Hives    Past Medical History:  Diagnosis Date   ADD (attention deficit disorder)    Allergic rhinitis, cause unspecified    Chronic kidney disease    Acute renal failure   Hypertension    Stroke [facility])     Past Medical History, Surgical history, Social history, and Family history were reviewed and updated as appropriate.   Please see review of systems for further details on the patient's review from today.   Objective:  Physical Exam:  There were no vitals taken for this visit.  Physical Exam Constitutional:      General: He is not in acute distress. Musculoskeletal:        General: No deformity.  Neurological:     Mental Status: He is alert and oriented to person, place, and time.     Coordination: Coordination normal.  Psychiatric:        Attention and Perception: Attention and perception normal. He does not perceive auditory or visual hallucinations.        Mood and Affect: Mood normal.  Mood is not anxious or depressed. Affect is not labile, blunt, angry or inappropriate.        Speech: Speech normal.        Behavior: Behavior normal.        Thought Content: Thought content normal. Thought content is not paranoid or delusional. Thought content does not include homicidal or suicidal ideation. Thought content does not include homicidal or suicidal plan.        Cognition and Memory: Cognition and memory normal.        Judgment: Judgment normal.     Comments: Insight intact     Lab Review:     Component Value Date/Time   NA 137 02/21/2023 0358   K 3.7 02/21/2023 0358   CL 101 02/21/2023 0358   CO2 26 02/21/2023 0358   GLUCOSE 119 (H) 02/21/2023 0358   BUN 16 02/21/2023 0358   CREATININE 0.99 02/21/2023 0358   CALCIUM 9.4 02/21/2023 0358   PROT 7.4 02/18/2023 1528   ALBUMIN 4.4 02/18/2023 1528   AST 14 (L) 02/18/2023 1528   ALT 16 02/18/2023 1528   ALKPHOS 65 02/18/2023 1528   BILITOT 0.6 02/18/2023 1528   GFRNONAA >60 02/21/2023 0358       Component Value Date/Time   WBC 7.9 02/21/2023 0358   RBC 5.73 02/21/2023 0358   HGB 17.1 (H) 02/21/2023 0358   HCT 49.8 02/21/2023 0358   PLT 230 02/21/2023 0358   MCV 86.9 02/21/2023 0358   MCH 29.8 02/21/2023 0358   MCHC 34.3 02/21/2023 0358   RDW 12.5 02/21/2023 0358   LYMPHSABS 2.5 02/18/2023 1528   MONOABS 0.9 02/18/2023 1528   EOSABS 0.2 02/18/2023 1528   BASOSABS 0.1 02/18/2023 1528    No results found for: "POCLITH", "LITHIUM"   No results found for: "PHENYTOIN", "PHENOBARB", "VALPROATE", "CBMZ"   .res Assessment: Plan:    Plan:  PDMP reviewed  Increase Stratera 60mg  to 80mg  daily  Time spent with patient was 10 minutes. Greater than 50% of face to face time with patient was spent on counseling and coordination of care.    RTC 3 months  Patient advised to contact office with any questions, adverse effects, or acute worsening in signs and symptoms.  There are no diagnoses linked to this  encounter.   Please see After Visit Summary for patient specific instructions.  Future Appointments  Date Time Provider Department Center  04/04/2023  9:40 AM Lacrecia Delval, Thereasa Solo, NP [facility] None  04/09/2023  8:45 AM Maia Breslow, CCC-SLP [facility]  07/05/2023  8:00 AM Chetan Mehring, Thereasa Solo, NP [facility] None    No orders of the defined types were placed in this encounter.   -------------------------------

## 2023-04-09 ENCOUNTER — Ambulatory Visit: Payer: 59 | Admitting: Speech Pathology

## 2023-04-09 ENCOUNTER — Ambulatory Visit: Payer: 59 | Admitting: Neurology

## 2023-04-09 DIAGNOSIS — R41841 Cognitive communication deficit: Secondary | ICD-10-CM

## 2023-04-09 DIAGNOSIS — R2689 Other abnormalities of gait and mobility: Secondary | ICD-10-CM | POA: Diagnosis not present

## 2023-04-09 NOTE — Patient Instructions (Signed)
Why Sleep? Sleep is when our brain is able to recharge and recover from our day Sleep is when our memories encode into long term memory!  During sleep our mind is able to work through stressful situations or problems we've faced and remove the emotionality form the situation. This is why you wake up feeling clear headed regarding a stressful or emotional situation When we are fatigued, we have a much harder time focusing (and remember-- attention is so important for every other thinking task we need to do!)   Sleep Hygiene Recommendations:  Create a sleep routine. It's best to go to bed and wake at same time daily.  Avoid using cell phone for at least 30 minutes prior to bedtime. Typically TV is ok, because it is a more passive activity Think about sleep environment: dark room, cool (65-68 degrees). If room is not dark enough, consider a sleep mask Viewing 2-10 minutes of morning light and sunset light can help set your circadian rhythm.  While viewing morning and evening light, consider implementing breathing exercises to target increasing attention capacity. Focus attention on breath- 3 counts in, 4 counts out. Set a timer and maintain attention as able, redirecting back to breath if your mind wanders  Avoid caffeine within 8 hours of bed time Dim your environment in the evening-- turn off lights you don't need, use lamps if able

## 2023-04-09 NOTE — Therapy (Signed)
OUTPATIENT SPEECH LANGUAGE PATHOLOGY TREATMENT (DISCHARGE)   Patient Name: Micheal Ayers MRN: [id number] DOB:11/06/1961, 62 y.o., male Today's Date: 04/09/2023  PCP: Mila Palmer, MD REFERRING PROVIDER: Rolly Salter, MD Mila Palmer, MD (documentation))  END OF SESSION:  End of Session - 04/09/23 4098     Visit Number 5    Number of Visits 9    Date for SLP Re-Evaluation 05/05/23    SLP Start Time 0838    SLP Stop Time  0917    SLP Time Calculation (min) 39 min    Activity Tolerance Patient tolerated treatment well              Past Medical History:  Diagnosis Date   ADD (attention deficit disorder)    Allergic rhinitis, cause unspecified    Chronic kidney disease    Acute renal failure   Hypertension    Stroke [facility])    Past Surgical History:  Procedure Laterality Date   elbow surgury  1969   left elbow   INGUINAL HERNIA REPAIR  2003   left   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/2023   Procedure: Shunt Placment - right occipital;  Surgeon: Julio Sicks, MD;  Location[facility] OR;  Service: Neurosurgery;  Laterality: Right;   Patient Active Problem List   Diagnosis Date Noted   Obstructive hydrocephalus (HCC) 03/13/2023   Ischemic cerebrovascular accident (CVA) (HCC) 02/19/2023   Cerebral ventriculomegaly 02/19/2023   Acute renal failure superimposed on stage 2 chronic kidney disease (HCC) 02/19/2023   Hypertensive urgency 02/19/2023   Abnormal CXR 02/19/2023   CVA (cerebral vascular accident) (HCC) 02/19/2023   Fatigue 09/20/2012   Family history of hemochromatosis 09/20/2012   Preventative health care 09/06/2012   ADD (attention deficit disorder)    Allergic rhinitis, cause unspecified     ONSET DATE: 02/18/23   REFERRING DIAG:  Diagnosis  I63.9 (ICD-10-CM) - Acute CVA     THERAPY DIAG:  Cognitive communication deficit  Rationale for Evaluation and Treatment: Rehabilitation  SUBJECTIVE:   SUBJECTIVE STATEMENT: Micheal Ayers reports fatigue is  biggest barrier upon return to work  Micheal Ayers accompanied by: self  PERTINENT HISTORY: HPI: Micheal Ayers is a 62 y.o. M who presented 02/18/23 with slurred speech and balance difficulty. MRI brian 3/24: Small acute to early subacute small vessel infarcts involving the subcortical white matter of the anterior right frontal corona radiata and right thalamus. Micheal Ayers has appointment to discuss possible shunt placement for presence of NPH, next week. PMH: ADD.  PAIN:  Are you having pain? Yes: NPRS scale: 2/10 Pain location: head Pain description: headache Aggravating factors: IDK Relieving factors: IDK  PATIENT GOALS: Improve memory  OBJECTIVE:   DIAGNOSTIC FINDINGS:  MR without contrast 02/18/23: IMPRESSION: 1. Small acute to early subacute small vessel infarcts involving the subcortical white matter of the anterior right frontal corona radiata and right thalamus. No associated hemorrhage or mass effect. 2. Marked lateral and third ventriculomegaly, somewhat out of proportion to overlying cortical sulcation. Clinical correlation for possible in pH is advised. Possible underlying cerebral aqueductal stenosis would be the primary differential consideration. No significant transependymal flow of CSF, suggesting at this is a chronic and compensated finding. 3. Underlying mild chronic microvascular ischemic disease. Small remote left cerebellar infarct.  Speech-Language Eval 02-19-23: Micheal Ayers participated in speech-language-cognition evaluation. 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 He reported that he is on medication for ADD, but still has some difficulty  attending at baseline. Micheal Ayers reported that he has recently noticed that he has been having difficulty counting money at work and that he has been forgetting whether he has completed tasks. He reported that his speech is now "slurred" and that it is now approximately 75% back to  baseline. The [facility] Mental Status Examination was completed to evaluate the Micheal Ayers's cognitive-linguistic skills. He achieved a score of 28/30 which is within the normal limits of 27 or more out of 30. However, Micheal Ayers stated that he believes his cognition and processing speed are slower. Some difficulty was noted with memory and tasks which required executive functions; self-correction and additional processing time were often necessary for accuracy during these tasks. Micheal Ayers also presented with mild dysarthria characterized by reduced articulatory precision and a reduced ability to adequately coordinate respiration with speech at the conversational level. Speech intelligibility was occasionally impacted during conversation. Micheal Ayers became tearful towards the end of the evaluation and expressed that he was having difficulty emotionally processing everything; Micheal Ayers verbalized agreement with spiritual care consult and Ty, RN advised that she will place the order. Skilled SLP services are clinically indicated at this time to improve motor speech and cognitive-linguistic function.   PATIENT REPORTED OUTCOME MEASURES (PROM): Cognitive Function: (long form) provided today and Micheal Ayers to return next visit.   TODAY'S TREATMENT:                                                                                                                                         DATE:  04/09/23: education provided re: energy management and sleep hygiene recommendations. Following education, Micheal Ayers able to verbalize x3 strategies he can implement to manage fatigue throughout the day. Reporting decreased motivation and task initiation resulting in decreased completion of household tasks. Education provided on executive function strategies to support. Following, Micheal Ayers able to break up cleaning bathroom into small steps and generate x2 strategies to make task easier. Recommend using to-do list with top three items to encourage task initiation and  completion. Micheal Ayers agreeable to ST d/c at this time, reports has tools he needs at this time.   03/28/23: Micheal Ayers arrived today 15 minutes late due to oven installer arriving 60 minutes late. Micheal Ayers keeping track of appointments on his planner. Has not skipped an appointment.  Micheal Ayers will track tasks differently at work - listing items on a paper list near computer, and in computer for results of lists (expected delivery date/s, e.g.). SLP and Micheal Ayers discussed energy conservation by planning trips in the warehouse to get as much as possible in one trip, asking others to pick up things for him when they are returning back by him, etc. Micheal Ayers meds still independent without any skips and refills are going WNL. Micheal Ayers will ask Dr. Paulino Rily about any necessary refills when stopping today to ask about BP (elevated diastolic).   03/21/23: Micheal Ayers states his memory is better since  last session, however he still thinks attention is slightly depressed compared to prior to CVA. He is currently on Strattera 60mg  and SLP suggested talking with his referring MD for Strattera if it would be appropriate for him to have a dosage increase. Micheal Ayers req'd cues for putting this and a reminder to call neurosurgeon about headaches into his phone.  03/06/23: Micheal Ayers has taken his meds without an alarm in the past week and been 100% successful. He has not gotten the planner yet but is going to today. He is using other memory compensations such as taking pictures. SLP encouraged planner usage once Micheal Ayers obtains planner. SLP reviewed Micheal Ayers's goals with him and he agreed that they are appropriate.  Micheal Ayers to return to work next week and SLP and Micheal Ayers discussed some ways to make this transition easier using compensations. Micheal Ayers reminded SLP he is dx'd with ADD prior to CVA and did not always have the best habits with memory compensations.  02/27/23: Today Micheal Ayers and SLP reviewed keeping a planner and putting alarms in his phone for his meds; SLP also explained NO STOPPING alarms until he has COMPLETED  the thing the alarm is set for. Micheal Ayers acknowledged understanding. SLP performed diagnostic therapy and Micheal Ayers wrote down the list as much as possible and he recalled 11/12 and told SLP he could have remembered one more if his writing were more legible.   PATIENT EDUCATION: Education details: see "today's treatment" Person educated: Patient Education method: Explanation, Demonstration, Verbal cues, and Handouts Education comprehension: verbalized understanding, returned demonstration, verbal cues required, and needs further education   GOALS: Goals reviewed with patient? Yes  SHORT TERM GOALS: Target date: 04/01/23  Micheal Ayers will utilize at least 1 compensation for memory during the day in 3 sessions Baseline:03/06/23, 03/28/23 Goal status: Ongoing  2.  Micheal Ayers will report 100% compliance with meds and appointments with modified independence for 1 week Baseline:  Goal status: Met  3.  Micheal Ayers will indicate on-time payment of all bills for one bill cycle with mod I (with use of compensations) Baseline:  Goal status: Met    LONG TERM GOALS: Target date: 05/05/23  Micheal Ayers will utilize at least 2 compensations for memory during  his workday  Baseline:  Goal status: met  2.  Micheal Ayers will report 100% compliance with meds and appointments with modified independent for 2 weeks Baseline:  Goal status: met  3.  Micheal Ayers will indicate on-time payment of all bills for one bill cycle  after 04/01/23 with mod I (with use of compensations) Baseline:  Goal status: deferred, Micheal Ayers d/c early has not needed to pay bill in past week  4.  Micheal Ayers will score better than initial administration on PROM Baseline:  Goal status: deferred, not completed at eval  ASSESSMENT:  CLINICAL IMPRESSION: Patient is a 62 y.o. male who was seen today for treatment of speech and memory skills following CVAs 02/18/23. 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 Prior to CVA Micheal Ayers reports he was  not adequately compensating for his ADD and would miss bill due dates and medications. Micheal Ayers has implemented memory strategies to aid in work performance, with primary concern at d/c being fatigue.   OBJECTIVE IMPAIRMENTS: include memory. These impairments are limiting patient from return to work, managing medications, managing appointments, managing finances, household responsibilities, ADLs/IADLs, and effectively communicating at home and in community. Factors affecting potential to achieve goals and functional outcome are previous  level of function.. Patient will benefit from skilled SLP services to address above impairments and improve overall function.  REHAB POTENTIAL: Good  PLAN:  SLP FREQUENCY: 1x/week  SLP DURATION: 8 weeks  PLANNED INTERVENTIONS: Environmental controls, Cognitive reorganization, Internal/external aids, Functional tasks, SLP instruction and feedback, Compensatory strategies, and Patient/family education  SPEECH THERAPY DISCHARGE SUMMARY  Visits from Start of Care: 5  Current functional level related to goals / functional outcomes: Improving cognition with primary residual concern being fatigue. Has returned to work, is managing appointments, paying bills. Continues to report challenges in completing home based tasks d/t decreased task initiation and motivation.    Remaining deficits: Mild cognitive deficits    Education / Equipment: Cognitive strategies/compensations and employment of to meet personal needs, HEP   Patient agrees to discharge. Patient goals were partially met. Patient is being discharged due to the patient's request.   Maia Breslow, CCC-SLP 04/09/2023, 10:34 AM

## 2023-07-05 ENCOUNTER — Encounter: Payer: Self-pay | Admitting: Adult Health

## 2023-07-05 ENCOUNTER — Ambulatory Visit: Payer: 59 | Admitting: Adult Health

## 2023-07-05 DIAGNOSIS — F902 Attention-deficit hyperactivity disorder, combined type: Secondary | ICD-10-CM

## 2023-07-05 DIAGNOSIS — F331 Major depressive disorder, recurrent, moderate: Secondary | ICD-10-CM | POA: Diagnosis not present

## 2023-07-05 MED ORDER — BUPROPION HCL ER (XL) 300 MG PO TB24: 300.0000 mg | ORAL_TABLET | Freq: Every day | ORAL | 0 refills | Status: DC

## 2023-07-05 MED ORDER — BUPROPION HCL ER (XL) 150 MG PO TB24: 150.0000 mg | ORAL_TABLET | Freq: Every day | ORAL | 2 refills | Status: DC

## 2023-07-05 NOTE — Progress Notes (Signed)
Micheal Ayers [id number] January 31, 1961 62 y.o.  Subjective:   Patient ID:  Micheal Ayers is a 62 y.o. (DOB 06-30-1961) male.  Chief Complaint: No chief complaint on file.   HPI Micheal Ayers presents to the office today for follow-up of ADD.  Describes mood today as "ok". Denies tearfulness. Mood symptoms - reports depression. Reports some anxiety, and irritability. Denies panic attacks. Denies a little worry, rumination, and irritability. Mood is lower. Stating "I don't feel too good". Reports Terri Skains is helpful for focus and concentration, but feels like he needs help with mood symptoms. Varying interest and motivation. Taking medications as prescribed.  Energy levels lower. Active, does not have a regular exercise routine. Enjoys some usual interests and activities. Married. Lives with wife and daughter. Sister in [place]. Spending time with family. Appetite decreased. Weight loss - 15 pounds. Sleeps well most nights. Averages 8 to 9 hours.  Focus and concentration difficulties. Completing tasks. Managing aspects of household. Works full time. Denies SI or HI.  Denies AH or VH. Denies self harm. Denies substance use.  Previous medication trials:  Ritalin, Adderall   Flowsheet Row Admission (Discharged) from 03/13/2023 in [facility] to Hosp-Admission (Discharged) from 02/18/2023 in [facility]  C-SSRS RISK CATEGORY No Risk No Risk        Review of Systems:  Review of Systems  Musculoskeletal:  Negative for gait problem.  Neurological:  Negative for tremors.  Psychiatric/Behavioral:         Please refer to HPI    Medications: I have reviewed the patient's current medications.  Current Outpatient Medications  Medication Sig Dispense Refill   aspirin EC 81 MG tablet Take 1 tablet (81 mg total) by mouth daily. Swallow whole. 30 tablet 12   atomoxetine (STRATTERA) 80 MG capsule Take 1 capsule (80 mg total) by  mouth daily. 30 capsule 5   Cholecalciferol (VITAMIN D3) 50 MCG (2000 UT) CAPS Take 2,000 Units by mouth daily.     cyanocobalamin (VITAMIN B12) 1000 MCG tablet Take 1,000 mcg by mouth daily.     omeprazole (PRILOSEC OTC) 20 MG tablet Take 20 mg by mouth daily.     rosuvastatin (CRESTOR) 10 MG tablet Take 1 tablet (10 mg total) by mouth daily. 30 tablet 2   traMADol (ULTRAM) 50 MG tablet Take 1 tablet (50 mg total) by mouth every 6 (six) hours as needed. 20 tablet 0   valsartan-hydrochlorothiazide (DIOVAN-HCT) 80-12.5 MG tablet Take 1 tablet by mouth daily.     No current facility-administered medications for this visit.    Medication Side Effects: None  Allergies:  Allergies  Allergen Reactions   Erythromycin Hives    Past Medical History:  Diagnosis Date   ADD (attention deficit disorder)    Allergic rhinitis, cause unspecified    Chronic kidney disease    Acute renal failure   Hypertension    Stroke [facility])     Past Medical History, Surgical history, Social history, and Family history were reviewed and updated as appropriate.   Please see review of systems for further details on the patient's review from today.   Objective:   Physical Exam:  There were no vitals taken for this visit.  Physical Exam Constitutional:      General: He is not in acute distress. Musculoskeletal:        General: No deformity.  Neurological:     Mental Status: He is alert and oriented  to person, place, and time.     Coordination: Coordination normal.  Psychiatric:        Attention and Perception: Attention and perception normal. He does not perceive auditory or visual hallucinations.        Mood and Affect: Mood normal. Mood is not anxious or depressed. Affect is not labile, blunt, angry or inappropriate.        Speech: Speech normal.        Behavior: Behavior normal.        Thought Content: Thought content normal. Thought content is not paranoid or delusional. Thought content does not  include homicidal or suicidal ideation. Thought content does not include homicidal or suicidal plan.        Cognition and Memory: Cognition and memory normal.        Judgment: Judgment normal.     Comments: Insight intact     Lab Review:     Component Value Date/Time   NA 137 02/21/2023 0358   K 3.7 02/21/2023 0358   CL 101 02/21/2023 0358   CO2 26 02/21/2023 0358   GLUCOSE 119 (H) 02/21/2023 0358   BUN 16 02/21/2023 0358   CREATININE 0.99 02/21/2023 0358   CALCIUM 9.4 02/21/2023 0358   PROT 7.4 02/18/2023 1528   ALBUMIN 4.4 02/18/2023 1528   AST 14 (L) 02/18/2023 1528   ALT 16 02/18/2023 1528   ALKPHOS 65 02/18/2023 1528   BILITOT 0.6 02/18/2023 1528   GFRNONAA >60 02/21/2023 0358       Component Value Date/Time   WBC 7.9 02/21/2023 0358   RBC 5.73 02/21/2023 0358   HGB 17.1 (H) 02/21/2023 0358   HCT 49.8 02/21/2023 0358   PLT 230 02/21/2023 0358   MCV 86.9 02/21/2023 0358   MCH 29.8 02/21/2023 0358   MCHC 34.3 02/21/2023 0358   RDW 12.5 02/21/2023 0358   LYMPHSABS 2.5 02/18/2023 1528   MONOABS 0.9 02/18/2023 1528   EOSABS 0.2 02/18/2023 1528   BASOSABS 0.1 02/18/2023 1528    No results found for: "POCLITH", "LITHIUM"   No results found for: "PHENYTOIN", "PHENOBARB", "VALPROATE", "CBMZ"   .res Assessment: Plan:    Plan:  PDMP reviewed  Stratera 80mg  daily  Add Wellbutrin XL 150mg  daily for depression  RTC 3 months  Patient advised to contact office with any questions, adverse effects, or acute worsening in signs and symptoms.  There are no diagnoses linked to this encounter.   Please see After Visit Summary for patient specific instructions.  Future Appointments  Date Time Provider Department Center  07/05/2023  8:00 AM , Thereasa Solo, NP CP-CP None    No orders of the defined types were placed in this encounter.   -------------------------------

## 2023-08-02 ENCOUNTER — Encounter: Payer: Self-pay | Admitting: Adult Health

## 2023-08-02 ENCOUNTER — Ambulatory Visit (INDEPENDENT_AMBULATORY_CARE_PROVIDER_SITE_OTHER): Payer: 59 | Admitting: Adult Health

## 2023-08-02 DIAGNOSIS — F331 Major depressive disorder, recurrent, moderate: Secondary | ICD-10-CM

## 2023-08-02 DIAGNOSIS — F902 Attention-deficit hyperactivity disorder, combined type: Secondary | ICD-10-CM

## 2023-08-02 MED ORDER — DESVENLAFAXINE SUCCINATE ER 50 MG PO TB24
50.0000 mg | ORAL_TABLET | Freq: Every day | ORAL | 2 refills | Status: AC
Start: 2023-08-02 — End: ?

## 2023-08-02 NOTE — Progress Notes (Signed)
Micheal Ayers [id number] Jun 21, 1961 62 y.o.  Subjective:   Patient ID:  Micheal Ayers is a 62 y.o. (DOB [birth year]) male.  Chief Complaint: No chief complaint on file.   HPI Micheal Ayers presents to the office today for follow-up of ADD and MDD.  Describes mood today as "ok". Denies tearfulness. Mood symptoms - reports depression - 4 out of 10 - "kinda bummed out". Reports some anxiety and irritability - "a little of both, but not over the top". Denies panic attacks. Reports some  worry, rumination, and irritability. Mood is lower. Stating "I'm still not feeling too good". Does not feel like the addition of Wellbutrin has been helpful. Feels like Terri Skains is helpful for focus and concentration. Varying interest and motivation. Taking medications as prescribed.  Energy levels lower. Active, does not have a regular exercise routine. Enjoys some usual interests and activities. Married. Lives with wife and daughter. Sister in [place]. Spending time with family. Appetite decreased. Weight gain - 10 pounds. Sleeps well most nights. Averages 7 to 8 hours.  Focus and concentration difficulties. Completing tasks. Managing aspects of household. Works full time. Denies SI or HI.  Denies AH or VH. Denies self harm. Denies substance use.  Previous medication trials:  Ritalin, Adderall   Flowsheet Row Admission (Discharged) from 03/13/2023 in [facility] to Hosp-Admission (Discharged) from 02/18/2023 in [facility]  C-SSRS RISK CATEGORY No Risk No Risk        Review of Systems:  Review of Systems  Musculoskeletal:  Negative for gait problem.  Neurological:  Negative for tremors.  Psychiatric/Behavioral:         Please refer to HPI    Medications: I have reviewed the patient's current medications.  Current Outpatient Medications  Medication Sig Dispense Refill   aspirin EC 81 MG tablet Take 1 tablet (81 mg total) by mouth  daily. Swallow whole. 30 tablet 12   atomoxetine (STRATTERA) 80 MG capsule Take 1 capsule (80 mg total) by mouth daily. 30 capsule 5   buPROPion (WELLBUTRIN XL) 150 MG 24 hr tablet Take 1 tablet (150 mg total) by mouth daily. 30 tablet 2   Cholecalciferol (VITAMIN D3) 50 MCG (2000 UT) CAPS Take 2,000 Units by mouth daily.     cyanocobalamin (VITAMIN B12) 1000 MCG tablet Take 1,000 mcg by mouth daily.     omeprazole (PRILOSEC OTC) 20 MG tablet Take 20 mg by mouth daily.     rosuvastatin (CRESTOR) 10 MG tablet Take 1 tablet (10 mg total) by mouth daily. 30 tablet 2   traMADol (ULTRAM) 50 MG tablet Take 1 tablet (50 mg total) by mouth every 6 (six) hours as needed. 20 tablet 0   valsartan-hydrochlorothiazide (DIOVAN-HCT) 80-12.5 MG tablet Take 1 tablet by mouth daily.     No current facility-administered medications for this visit.    Medication Side Effects: None  Allergies:  Allergies  Allergen Reactions   Erythromycin Hives    Past Medical History:  Diagnosis Date   ADD (attention deficit disorder)    Allergic rhinitis, cause unspecified    Chronic kidney disease    Acute renal failure   Hypertension    Stroke [facility])     Past Medical History, Surgical history, Social history, and Family history were reviewed and updated as appropriate.   Please see review of systems for further details on the patient's review from today.   Objective:   Physical Exam:  There  were no vitals taken for this visit.  Physical Exam Constitutional:      General: He is not in acute distress. Musculoskeletal:        General: No deformity.  Neurological:     Mental Status: He is alert and oriented to person, place, and time.     Coordination: Coordination normal.  Psychiatric:        Attention and Perception: Attention and perception normal. He does not perceive auditory or visual hallucinations.        Mood and Affect: Mood normal. Mood is not anxious or depressed. Affect is not labile, blunt,  angry or inappropriate.        Speech: Speech normal.        Behavior: Behavior normal.        Thought Content: Thought content normal. Thought content is not paranoid or delusional. Thought content does not include homicidal or suicidal ideation. Thought content does not include homicidal or suicidal plan.        Cognition and Memory: Cognition and memory normal.        Judgment: Judgment normal.     Comments: Insight intact     Lab Review:     Component Value Date/Time   NA 137 02/21/2023 0358   K 3.7 02/21/2023 0358   CL 101 02/21/2023 0358   CO2 26 02/21/2023 0358   GLUCOSE 119 (H) 02/21/2023 0358   BUN 16 02/21/2023 0358   CREATININE 0.99 02/21/2023 0358   CALCIUM 9.4 02/21/2023 0358   PROT 7.4 02/18/2023 1528   ALBUMIN 4.4 02/18/2023 1528   AST 14 (L) 02/18/2023 1528   ALT 16 02/18/2023 1528   ALKPHOS 65 02/18/2023 1528   BILITOT 0.6 02/18/2023 1528   GFRNONAA >60 02/21/2023 0358       Component Value Date/Time   WBC 7.9 02/21/2023 0358   RBC 5.73 02/21/2023 0358   HGB 17.1 (H) 02/21/2023 0358   HCT 49.8 02/21/2023 0358   PLT 230 02/21/2023 0358   MCV 86.9 02/21/2023 0358   MCH 29.8 02/21/2023 0358   MCHC 34.3 02/21/2023 0358   RDW 12.5 02/21/2023 0358   LYMPHSABS 2.5 02/18/2023 1528   MONOABS 0.9 02/18/2023 1528   EOSABS 0.2 02/18/2023 1528   BASOSABS 0.1 02/18/2023 1528    No results found for: "POCLITH", "LITHIUM"   No results found for: "PHENYTOIN", "PHENOBARB", "VALPROATE", "CBMZ"   .res Assessment: Plan:    Plan:  PDMP reviewed  Stratera 80mg  daily  D/C Wellbutrin XL 150mg  daily for depression - not helpful Add Pristiq 50mg    RTC 4 weeks  Time spent with patient was 15 minutes. Greater than 50% of face to face time with patient was spent on counseling and coordination of care.    Patient advised to contact office with any questions, adverse effects, or acute worsening in signs and symptoms.  There are no diagnoses linked to this  encounter.   Please see After Visit Summary for patient specific instructions.  Future Appointments  Date Time Provider Department Center  08/02/2023  8:00 AM Winnifred Dufford, Thereasa Solo, NP CP-CP None    No orders of the defined types were placed in this encounter.   -------------------------------

## 2023-08-27 ENCOUNTER — Telehealth: Payer: Self-pay | Admitting: Adult Health

## 2023-08-27 NOTE — Telephone Encounter (Signed)
Next visit is 08/29/23. Janyth Pupa made an earlier appt to see Rene Kocher as she has some questions about the Vyvanse. He hasn't seen much improvement in taking it. He wanted to come in person to see her. Phone number is [phone] (M)

## 2023-08-27 NOTE — Telephone Encounter (Signed)
Error

## 2023-08-29 ENCOUNTER — Encounter: Payer: Self-pay | Admitting: Adult Health

## 2023-08-29 ENCOUNTER — Ambulatory Visit: Payer: 59 | Admitting: Adult Health

## 2023-08-29 DIAGNOSIS — F902 Attention-deficit hyperactivity disorder, combined type: Secondary | ICD-10-CM

## 2023-08-29 DIAGNOSIS — F331 Major depressive disorder, recurrent, moderate: Secondary | ICD-10-CM | POA: Diagnosis not present

## 2023-08-29 MED ORDER — BUPROPION HCL ER (XL) 150 MG PO TB24
150.0000 mg | ORAL_TABLET | Freq: Every day | ORAL | 0 refills | Status: DC
Start: 2023-08-29 — End: 2023-10-03

## 2023-08-29 MED ORDER — BUPROPION HCL ER (XL) 300 MG PO TB24
300.0000 mg | ORAL_TABLET | Freq: Every day | ORAL | 2 refills | Status: DC
Start: 2023-08-29 — End: 2023-11-29

## 2023-08-29 NOTE — Progress Notes (Signed)
Micheal Ayers [id number] [birth year] 62 y.o.  Subjective:   Patient ID:  Micheal Ayers is a 62 y.o. (DOB January 12, 1961) male.  Chief Complaint: No chief complaint on file.   HPI Micheal Ayers presents to the office today for follow-up of ADD and MDD.  Describes mood today as "about the same". Denies tearfulness. Mood symptoms - reports depression - 7 out of 10. Does not feel like the addition of the Pristiq has been helpful.  Reports anxiety and irritability. Denies panic attacks. Reports some worry, rumination, and irritability. Mood is lower. Stating "I'm not feeling any better". Does not feel like the addition of Wellbutrin has been helpful. Feels like Terri Skains is helpful for focus and concentration. Varying interest and motivation. Taking medications as prescribed.  Energy levels lower. Active, does not have a regular exercise routine. Enjoys some usual interests and activities. Married. Lives with wife and daughter. Sister in [place]. Spending time with family. Appetite decreased. Weight steady. Sleeps well most nights. Averages 7 to 8 hours.  Focus and concentration difficulties. Completing tasks. Managing aspects of household. Works full time. Denies SI or HI.  Denies AH or VH. Denies self harm. Denies substance use.  Previous medication trials:  Ritalin, Adderall, Wellbutrin, Pristiq    Flowsheet Row Admission (Discharged) from 03/13/2023 in [facility] ED to Hosp-Admission (Discharged) from 02/18/2023 in [facility]  C-SSRS RISK CATEGORY No Risk No Risk        Review of Systems:  Review of Systems  Musculoskeletal:  Negative for gait problem.  Neurological:  Negative for tremors.  Psychiatric/Behavioral:         Please refer to HPI    Medications: I have reviewed the patient's current medications.  Current Outpatient Medications  Medication Sig Dispense Refill   aspirin EC 81 MG tablet Take 1 tablet (81  mg total) by mouth daily. Swallow whole. 30 tablet 12   atomoxetine (STRATTERA) 80 MG capsule Take 1 capsule (80 mg total) by mouth daily. 30 capsule 5   Cholecalciferol (VITAMIN D3) 50 MCG (2000 UT) CAPS Take 2,000 Units by mouth daily.     cyanocobalamin (VITAMIN B12) 1000 MCG tablet Take 1,000 mcg by mouth daily.     desvenlafaxine (PRISTIQ) 50 MG 24 hr tablet Take 1 tablet (50 mg total) by mouth daily. 30 tablet 2   omeprazole (PRILOSEC OTC) 20 MG tablet Take 20 mg by mouth daily.     rosuvastatin (CRESTOR) 10 MG tablet Take 1 tablet (10 mg total) by mouth daily. 30 tablet 2   valsartan-hydrochlorothiazide (DIOVAN-HCT) 80-12.5 MG tablet Take 1 tablet by mouth daily.     No current facility-administered medications for this visit.    Medication Side Effects: None  Allergies:  Allergies  Allergen Reactions   Erythromycin Hives    Past Medical History:  Diagnosis Date   ADD (attention deficit disorder)    Allergic rhinitis, cause unspecified    Chronic kidney disease    Acute renal failure   Hypertension    Stroke [facility])     Past Medical History, Surgical history, Social history, and Family history were reviewed and updated as appropriate.   Please see review of systems for further details on the patient's review from today.   Objective:   Physical Exam:  There were no vitals taken for this visit.  Physical Exam Constitutional:      General: He is not in acute distress. Musculoskeletal:  General: No deformity.  Neurological:     Mental Status: He is alert and oriented to person, place, and time.     Coordination: Coordination normal.  Psychiatric:        Attention and Perception: Attention and perception normal. He does not perceive auditory or visual hallucinations.        Mood and Affect: Mood normal. Mood is not anxious or depressed. Affect is not labile, blunt, angry or inappropriate.        Speech: Speech normal.        Behavior: Behavior normal.         Thought Content: Thought content normal. Thought content is not paranoid or delusional. Thought content does not include homicidal or suicidal ideation. Thought content does not include homicidal or suicidal plan.        Cognition and Memory: Cognition and memory normal.        Judgment: Judgment normal.     Comments: Insight intact     Lab Review:     Component Value Date/Time   NA 137 02/21/2023 0358   K 3.7 02/21/2023 0358   CL 101 02/21/2023 0358   CO2 26 02/21/2023 0358   GLUCOSE 119 (H) 02/21/2023 0358   BUN 16 02/21/2023 0358   CREATININE 0.99 02/21/2023 0358   CALCIUM 9.4 02/21/2023 0358   PROT 7.4 02/18/2023 1528   ALBUMIN 4.4 02/18/2023 1528   AST 14 (L) 02/18/2023 1528   ALT 16 02/18/2023 1528   ALKPHOS 65 02/18/2023 1528   BILITOT 0.6 02/18/2023 1528   GFRNONAA >60 02/21/2023 0358       Component Value Date/Time   WBC 7.9 02/21/2023 0358   RBC 5.73 02/21/2023 0358   HGB 17.1 (H) 02/21/2023 0358   HCT 49.8 02/21/2023 0358   PLT 230 02/21/2023 0358   MCV 86.9 02/21/2023 0358   MCH 29.8 02/21/2023 0358   MCHC 34.3 02/21/2023 0358   RDW 12.5 02/21/2023 0358   LYMPHSABS 2.5 02/18/2023 1528   MONOABS 0.9 02/18/2023 1528   EOSABS 0.2 02/18/2023 1528   BASOSABS 0.1 02/18/2023 1528    No results found for: "POCLITH", "LITHIUM"   No results found for: "PHENYTOIN", "PHENOBARB", "VALPROATE", "CBMZ"   .res Assessment: Plan:    Plan:  PDMP reviewed  Stratera 80mg  daily  Restart Wellbutrin XL 150mg  daily x 7 days, then increase to 300mg  daily. Denies seizure history.  RTC 4 weeks  Patient advised to contact office with any questions, adverse effects, or acute worsening in signs and symptoms.  There are no diagnoses linked to this encounter.   Please see After Visit Summary for patient specific instructions.  Future Appointments  Date Time Provider Department Center  08/29/2023  8:20 AM Prerana Strayer, Thereasa Solo, NP CP-CP None  09/05/2023  8:00 AM  Sharelle Burditt, Thereasa Solo, NP CP-CP None    No orders of the defined types were placed in this encounter.   -------------------------------

## 2023-09-05 ENCOUNTER — Ambulatory Visit: Payer: 59 | Admitting: Adult Health

## 2023-09-07 ENCOUNTER — Other Ambulatory Visit (HOSPITAL_COMMUNITY): Payer: Self-pay | Admitting: Neurosurgery

## 2023-09-07 DIAGNOSIS — G911 Obstructive hydrocephalus: Secondary | ICD-10-CM

## 2023-09-12 ENCOUNTER — Ambulatory Visit (HOSPITAL_COMMUNITY)
Admission: RE | Admit: 2023-09-12 | Discharge: 2023-09-12 | Disposition: A | Discharge status: Home / Self Care | Payer: 59 | Source: Ambulatory Visit | Attending: Neurosurgery | Admitting: Neurosurgery

## 2023-09-12 DIAGNOSIS — G911 Obstructive hydrocephalus: Secondary | ICD-10-CM | POA: Diagnosis present

## 2023-10-03 ENCOUNTER — Encounter: Payer: Self-pay | Admitting: Adult Health

## 2023-10-03 ENCOUNTER — Ambulatory Visit: Payer: 59 | Admitting: Adult Health

## 2023-10-03 DIAGNOSIS — F902 Attention-deficit hyperactivity disorder, combined type: Secondary | ICD-10-CM

## 2023-10-03 DIAGNOSIS — F331 Major depressive disorder, recurrent, moderate: Secondary | ICD-10-CM

## 2023-10-03 MED ORDER — ATOMOXETINE HCL 80 MG PO CAPS: 80.0000 mg | ORAL_CAPSULE | Freq: Every day | ORAL | 5 refills | Status: DC

## 2023-10-03 NOTE — Progress Notes (Signed)
Micheal Ayers [id number] 1961/02/10 62 y.o.  Subjective:   Patient ID:  Micheal Ayers is a 62 y.o. (DOB 1961-03-22) male.  Chief Complaint: No chief complaint on file.   HPI Micheal Ayers presents to the office today for follow-up of ADD and MDD.  Describes mood today as "better". Pleasant. Denies tearfulness. Mood symptoms - reports depression has improved - 3 out of 10. Reports anxiety and irritability at times. Denies recent panic attacks. Reports some worry, rumination, and over thinking. Mood has improved. Stating "I feel like I'm in a better place". Feels like current medication regimen is working well for him. Varying interest and motivation. Taking medications as prescribed.  Energy levels lower. Active, does not have a regular exercise routine. Enjoys some usual interests and activities. Married. Lives with wife and daughter. Sister in [place]. Spending time with family. Appetite decreased. Weight loss - 5 pounds. Sleeps well most nights. Averages 7 to 8 hours.  Focus and concentration "ok, but not great". Completing tasks. Managing aspects of household. Works full time. Denies SI or HI.  Denies AH or VH. Denies self harm. Denies substance use.  Previous medication trials:  Ritalin, Adderall, Wellbutrin, Pristiq   Flowsheet Row Admission (Discharged) from 03/13/2023 in [facility] to Hosp-Admission (Discharged) from 02/18/2023 in [facility]  C-SSRS RISK CATEGORY No Risk No Risk        Review of Systems:  Review of Systems  Musculoskeletal:  Negative for gait problem.  Neurological:  Negative for tremors.  Psychiatric/Behavioral:         Please refer to HPI    Medications: I have reviewed the patient's current medications.  Current Outpatient Medications  Medication Sig Dispense Refill   aspirin EC 81 MG tablet Take 1 tablet (81 mg total) by mouth daily. Swallow whole. 30 tablet 12   atomoxetine  (STRATTERA) 80 MG capsule Take 1 capsule (80 mg total) by mouth daily. 30 capsule 5   buPROPion (WELLBUTRIN XL) 150 MG 24 hr tablet Take 1 tablet (150 mg total) by mouth daily. 7 tablet 0   buPROPion (WELLBUTRIN XL) 300 MG 24 hr tablet Take 1 tablet (300 mg total) by mouth daily. 30 tablet 2   Cholecalciferol (VITAMIN D3) 50 MCG (2000 UT) CAPS Take 2,000 Units by mouth daily.     cyanocobalamin (VITAMIN B12) 1000 MCG tablet Take 1,000 mcg by mouth daily.     omeprazole (PRILOSEC OTC) 20 MG tablet Take 20 mg by mouth daily.     rosuvastatin (CRESTOR) 10 MG tablet Take 1 tablet (10 mg total) by mouth daily. 30 tablet 2   valsartan-hydrochlorothiazide (DIOVAN-HCT) 80-12.5 MG tablet Take 1 tablet by mouth daily.     No current facility-administered medications for this visit.    Medication Side Effects: None  Allergies:  Allergies  Allergen Reactions   Erythromycin Hives    Past Medical History:  Diagnosis Date   ADD (attention deficit disorder)    Allergic rhinitis, cause unspecified    Chronic kidney disease    Acute renal failure   Hypertension    Stroke [facility])     Past Medical History, Surgical history, Social history, and Family history were reviewed and updated as appropriate.   Please see review of systems for further details on the patient's review from today.   Objective:   Physical Exam:  There were no vitals taken for this visit.  Physical Exam Constitutional:  General: He is not in acute distress. Musculoskeletal:        General: No deformity.  Neurological:     Mental Status: He is alert and oriented to person, place, and time.     Coordination: Coordination normal.  Psychiatric:        Attention and Perception: Attention and perception normal. He does not perceive auditory or visual hallucinations.        Mood and Affect: Mood normal. Mood is not anxious or depressed. Affect is not labile, blunt, angry or inappropriate.        Speech: Speech normal.         Behavior: Behavior normal.        Thought Content: Thought content normal. Thought content is not paranoid or delusional. Thought content does not include homicidal or suicidal ideation. Thought content does not include homicidal or suicidal plan.        Cognition and Memory: Cognition and memory normal.        Judgment: Judgment normal.     Comments: Insight intact     Lab Review:     Component Value Date/Time   NA 137 02/21/2023 0358   K 3.7 02/21/2023 0358   CL 101 02/21/2023 0358   CO2 26 02/21/2023 0358   GLUCOSE 119 (H) 02/21/2023 0358   BUN 16 02/21/2023 0358   CREATININE 0.99 02/21/2023 0358   CALCIUM 9.4 02/21/2023 0358   PROT 7.4 02/18/2023 1528   ALBUMIN 4.4 02/18/2023 1528   AST 14 (L) 02/18/2023 1528   ALT 16 02/18/2023 1528   ALKPHOS 65 02/18/2023 1528   BILITOT 0.6 02/18/2023 1528   GFRNONAA >60 02/21/2023 0358       Component Value Date/Time   WBC 7.9 02/21/2023 0358   RBC 5.73 02/21/2023 0358   HGB 17.1 (H) 02/21/2023 0358   HCT 49.8 02/21/2023 0358   PLT 230 02/21/2023 0358   MCV 86.9 02/21/2023 0358   MCH 29.8 02/21/2023 0358   MCHC 34.3 02/21/2023 0358   RDW 12.5 02/21/2023 0358   LYMPHSABS 2.5 02/18/2023 1528   MONOABS 0.9 02/18/2023 1528   EOSABS 0.2 02/18/2023 1528   BASOSABS 0.1 02/18/2023 1528    No results found for: "POCLITH", "LITHIUM"   No results found for: "PHENYTOIN", "PHENOBARB", "VALPROATE", "CBMZ"   .res Assessment: Plan:    Plan:  PDMP reviewed  Stratera 80mg  daily Wellbutrin XL 300mg  daily. Denies seizure history.  RTC 4 weeks  Patient advised to contact office with any questions, adverse effects, or acute worsening in signs and symptoms.  There are no diagnoses linked to this encounter.   Please see After Visit Summary for patient specific instructions.  Future Appointments  Date Time Provider Department Center  10/03/2023  8:20 AM Thomasene Dubow, Thereasa Solo, NP CP-CP None    No orders of the defined types  were placed in this encounter.   -------------------------------

## 2023-11-29 ENCOUNTER — Encounter: Payer: Self-pay | Admitting: Adult Health

## 2023-11-29 ENCOUNTER — Ambulatory Visit: Payer: 59 | Admitting: Adult Health

## 2023-11-29 DIAGNOSIS — F331 Major depressive disorder, recurrent, moderate: Secondary | ICD-10-CM | POA: Diagnosis not present

## 2023-11-29 DIAGNOSIS — F902 Attention-deficit hyperactivity disorder, combined type: Secondary | ICD-10-CM

## 2023-11-29 MED ORDER — BUPROPION HCL ER (XL) 300 MG PO TB24: 300.0000 mg | ORAL_TABLET | Freq: Every day | ORAL | 5 refills | Status: DC

## 2023-11-29 NOTE — Progress Notes (Signed)
 [name] [id number] 08-17-1961 63 y.o.  Subjective:   Patient ID:  Micheal Ayers is a 63 y.o. (DOB 1961-04-29) male.  Chief Complaint: No chief complaint on file.   HPI BREYSON KELM presents to the office today for follow-up of ADD and MDD.  Describes mood today as ok. Pleasant. Denies tearfulness. Mood symptoms - reports depression has improved. Stating I go through spells. Reports anxiety and irritability at times. Denies recent panic attacks. Reports some worry, rumination, and over thinking. Mood is more stable. Stating I feel like the lows are not what the where - pretty even keeled. Feels like current medication regimen is working well for him. Varying interest and motivation. Taking medications as prescribed.  Energy levels lower. Active, does not have a regular exercise routine. Enjoys some usual interests and activities. Married. Lives with wife and daughter. Sister in [place]. Spending time with family. Appetite adequate. Weight gain from what he lost from surgery.  Sleeps well most nights. Averages 9 hours.  Focus and concentration ok. Completing tasks. Managing aspects of household. Works full time. Denies SI or HI.  Denies AH or VH. Denies self harm. Denies substance use.  Previous medication trials:  Ritalin, Adderall, Wellbutrin , Pristiq   Flowsheet Row Admission (Discharged) from 03/13/2023 in [facility] ED to Hosp-Admission (Discharged) from 02/18/2023 in [facility] [facility]  C-SSRS RISK CATEGORY No Risk No Risk        Review of Systems:  Review of Systems  Musculoskeletal:  Negative for gait problem.  Neurological:  Negative for tremors.  Psychiatric/Behavioral:         Please refer to HPI    Medications: I have reviewed the patient's current medications.  Current Outpatient Medications  Medication Sig Dispense Refill   aspirin  EC 81 MG tablet Take 1 tablet (81 mg total) by mouth  daily. Swallow whole. 30 tablet 12   atomoxetine  (STRATTERA ) 80 MG capsule Take 1 capsule (80 mg total) by mouth daily. 30 capsule 5   buPROPion  (WELLBUTRIN  XL) 300 MG 24 hr tablet Take 1 tablet (300 mg total) by mouth daily. 30 tablet 5   Cholecalciferol  (VITAMIN D3) 50 MCG (2000 UT) CAPS Take 2,000 Units by mouth daily.     cyanocobalamin  (VITAMIN B12) 1000 MCG tablet Take 1,000 mcg by mouth daily.     omeprazole (PRILOSEC OTC) 20 MG tablet Take 20 mg by mouth daily.     rosuvastatin  (CRESTOR ) 10 MG tablet Take 1 tablet (10 mg total) by mouth daily. 30 tablet 2   valsartan -hydrochlorothiazide  (DIOVAN -HCT) 80-12.5 MG tablet Take 1 tablet by mouth daily.     No current facility-administered medications for this visit.    Medication Side Effects: None  Allergies:  Allergies  Allergen Reactions   Erythromycin Hives   Erythromycin Base     Past Medical History:  Diagnosis Date   ADD (attention deficit disorder)    Allergic rhinitis, cause unspecified    Chronic kidney disease    Acute renal failure   Hypertension    Stroke [facility])     Past Medical History, Surgical history, Social history, and Family history were reviewed and updated as appropriate.   Please see review of systems for further details on the patient's review from today.   Objective:   Physical Exam:  There were no vitals taken for this visit.  Physical Exam  Lab Review:     Component Value Date/Time   NA 137 02/21/2023 0358  K 3.7 02/21/2023 0358   CL 101 02/21/2023 0358   CO2 26 02/21/2023 0358   GLUCOSE 119 (H) 02/21/2023 0358   BUN 16 02/21/2023 0358   CREATININE 0.99 02/21/2023 0358   CALCIUM  9.4 02/21/2023 0358   PROT 7.4 02/18/2023 1528   ALBUMIN 4.4 02/18/2023 1528   AST 14 (L) 02/18/2023 1528   ALT 16 02/18/2023 1528   ALKPHOS 65 02/18/2023 1528   BILITOT 0.6 02/18/2023 1528   GFRNONAA >60 02/21/2023 0358       Component Value Date/Time   WBC 7.9 02/21/2023 0358   RBC 5.73 02/21/2023  0358   HGB 17.1 (H) 02/21/2023 0358   HCT 49.8 02/21/2023 0358   PLT 230 02/21/2023 0358   MCV 86.9 02/21/2023 0358   MCH 29.8 02/21/2023 0358   MCHC 34.3 02/21/2023 0358   RDW 12.5 02/21/2023 0358   LYMPHSABS 2.5 02/18/2023 1528   MONOABS 0.9 02/18/2023 1528   EOSABS 0.2 02/18/2023 1528   BASOSABS 0.1 02/18/2023 1528    No results found for: POCLITH, LITHIUM   No results found for: PHENYTOIN, PHENOBARB, VALPROATE, CBMZ   .res Assessment: Plan:    Plan:  PDMP reviewed  Stratera 80mg  daily Wellbutrin  XL 300mg  daily. Denies seizure history.  RTC 6 months  Patient advised to contact office with any questions, adverse effects, or acute worsening in signs and symptoms.  Diagnoses and all orders for this visit:  Attention deficit hyperactivity disorder (ADHD), combined type  Major depressive disorder, recurrent episode, moderate (HCC) -     buPROPion  (WELLBUTRIN  XL) 300 MG 24 hr tablet; Take 1 tablet (300 mg total) by mouth daily.     Please see After Visit Summary for patient specific instructions.  No future appointments.   No orders of the defined types were placed in this encounter.   -------------------------------

## 2024-02-24 ENCOUNTER — Encounter (HOSPITAL_BASED_OUTPATIENT_CLINIC_OR_DEPARTMENT_OTHER): Payer: Self-pay

## 2024-02-24 ENCOUNTER — Encounter (HOSPITAL_COMMUNITY)
Admission: EM | Disposition: A | Discharge status: Rehabilitation Facility | Payer: Self-pay | Source: Home / Self Care | Attending: Neurosurgery

## 2024-02-24 ENCOUNTER — Other Ambulatory Visit: Payer: Self-pay

## 2024-02-24 ENCOUNTER — Emergency Department (HOSPITAL_BASED_OUTPATIENT_CLINIC_OR_DEPARTMENT_OTHER): Payer: Self-pay

## 2024-02-24 ENCOUNTER — Emergency Department (HOSPITAL_COMMUNITY): Payer: Self-pay | Admitting: Anesthesiology

## 2024-02-24 ENCOUNTER — Inpatient Hospital Stay (HOSPITAL_BASED_OUTPATIENT_CLINIC_OR_DEPARTMENT_OTHER)
Admission: EM | Admit: 2024-02-24 | Discharge: 2024-03-03 | DRG: 025 | Disposition: A | Discharge status: Rehabilitation Facility | Payer: Self-pay | Attending: Neurosurgery | Admitting: Neurosurgery

## 2024-02-24 DIAGNOSIS — F32A Depression, unspecified: Secondary | ICD-10-CM | POA: Diagnosis present

## 2024-02-24 DIAGNOSIS — Z8249 Family history of ischemic heart disease and other diseases of the circulatory system: Secondary | ICD-10-CM

## 2024-02-24 DIAGNOSIS — I6203 Nontraumatic chronic subdural hemorrhage: Secondary | ICD-10-CM | POA: Diagnosis not present

## 2024-02-24 DIAGNOSIS — R29703 NIHSS score 3: Secondary | ICD-10-CM | POA: Diagnosis present

## 2024-02-24 DIAGNOSIS — R29810 Facial weakness: Secondary | ICD-10-CM | POA: Diagnosis present

## 2024-02-24 DIAGNOSIS — Z823 Family history of stroke: Secondary | ICD-10-CM | POA: Diagnosis not present

## 2024-02-24 DIAGNOSIS — G936 Cerebral edema: Secondary | ICD-10-CM | POA: Diagnosis present

## 2024-02-24 DIAGNOSIS — Z9889 Other specified postprocedural states: Secondary | ICD-10-CM

## 2024-02-24 DIAGNOSIS — G4700 Insomnia, unspecified: Secondary | ICD-10-CM | POA: Diagnosis present

## 2024-02-24 DIAGNOSIS — E785 Hyperlipidemia, unspecified: Secondary | ICD-10-CM | POA: Diagnosis present

## 2024-02-24 DIAGNOSIS — I6200 Nontraumatic subdural hemorrhage, unspecified: Principal | ICD-10-CM | POA: Diagnosis present

## 2024-02-24 DIAGNOSIS — N400 Enlarged prostate without lower urinary tract symptoms: Secondary | ICD-10-CM | POA: Diagnosis present

## 2024-02-24 DIAGNOSIS — I951 Orthostatic hypotension: Secondary | ICD-10-CM | POA: Diagnosis present

## 2024-02-24 DIAGNOSIS — R5382 Chronic fatigue, unspecified: Secondary | ICD-10-CM | POA: Diagnosis present

## 2024-02-24 DIAGNOSIS — Z8673 Personal history of transient ischemic attack (TIA), and cerebral infarction without residual deficits: Secondary | ICD-10-CM

## 2024-02-24 DIAGNOSIS — Z87891 Personal history of nicotine dependence: Secondary | ICD-10-CM

## 2024-02-24 DIAGNOSIS — Z7982 Long term (current) use of aspirin: Secondary | ICD-10-CM

## 2024-02-24 DIAGNOSIS — K5900 Constipation, unspecified: Secondary | ICD-10-CM | POA: Diagnosis not present

## 2024-02-24 DIAGNOSIS — K219 Gastro-esophageal reflux disease without esophagitis: Secondary | ICD-10-CM | POA: Diagnosis present

## 2024-02-24 DIAGNOSIS — F988 Other specified behavioral and emotional disorders with onset usually occurring in childhood and adolescence: Secondary | ICD-10-CM | POA: Diagnosis present

## 2024-02-24 DIAGNOSIS — Z79899 Other long term (current) drug therapy: Secondary | ICD-10-CM | POA: Diagnosis not present

## 2024-02-24 DIAGNOSIS — I10 Essential (primary) hypertension: Secondary | ICD-10-CM

## 2024-02-24 DIAGNOSIS — I69298 Other sequelae of other nontraumatic intracranial hemorrhage: Secondary | ICD-10-CM | POA: Diagnosis not present

## 2024-02-24 DIAGNOSIS — G479 Sleep disorder, unspecified: Secondary | ICD-10-CM | POA: Diagnosis not present

## 2024-02-24 DIAGNOSIS — R4781 Slurred speech: Secondary | ICD-10-CM | POA: Diagnosis present

## 2024-02-24 DIAGNOSIS — K5901 Slow transit constipation: Secondary | ICD-10-CM | POA: Diagnosis not present

## 2024-02-24 DIAGNOSIS — R4701 Aphasia: Secondary | ICD-10-CM | POA: Diagnosis present

## 2024-02-24 DIAGNOSIS — I679 Cerebrovascular disease, unspecified: Secondary | ICD-10-CM | POA: Diagnosis not present

## 2024-02-24 DIAGNOSIS — S065XAA Traumatic subdural hemorrhage with loss of consciousness status unknown, initial encounter: Principal | ICD-10-CM | POA: Diagnosis present

## 2024-02-24 DIAGNOSIS — F909 Attention-deficit hyperactivity disorder, unspecified type: Secondary | ICD-10-CM | POA: Diagnosis present

## 2024-02-24 DIAGNOSIS — Z888 Allergy status to other drugs, medicaments and biological substances status: Secondary | ICD-10-CM | POA: Diagnosis not present

## 2024-02-24 DIAGNOSIS — E871 Hypo-osmolality and hyponatremia: Secondary | ICD-10-CM | POA: Diagnosis present

## 2024-02-24 DIAGNOSIS — Z881 Allergy status to other antibiotic agents status: Secondary | ICD-10-CM | POA: Diagnosis not present

## 2024-02-24 DIAGNOSIS — Z982 Presence of cerebrospinal fluid drainage device: Secondary | ICD-10-CM | POA: Diagnosis not present

## 2024-02-24 DIAGNOSIS — T502X5A Adverse effect of carbonic-anhydrase inhibitors, benzothiadiazides and other diuretics, initial encounter: Secondary | ICD-10-CM | POA: Diagnosis present

## 2024-02-24 DIAGNOSIS — R4586 Emotional lability: Secondary | ICD-10-CM | POA: Diagnosis not present

## 2024-02-24 DIAGNOSIS — R4189 Other symptoms and signs involving cognitive functions and awareness: Secondary | ICD-10-CM | POA: Diagnosis present

## 2024-02-24 DIAGNOSIS — G935 Compression of brain: Secondary | ICD-10-CM | POA: Diagnosis present

## 2024-02-24 DIAGNOSIS — F411 Generalized anxiety disorder: Secondary | ICD-10-CM | POA: Diagnosis not present

## 2024-02-24 DIAGNOSIS — G8191 Hemiplegia, unspecified affecting right dominant side: Secondary | ICD-10-CM | POA: Diagnosis present

## 2024-02-24 DIAGNOSIS — G9349 Other encephalopathy: Secondary | ICD-10-CM | POA: Diagnosis present

## 2024-02-24 DIAGNOSIS — R5381 Other malaise: Secondary | ICD-10-CM | POA: Diagnosis not present

## 2024-02-24 DIAGNOSIS — D72829 Elevated white blood cell count, unspecified: Secondary | ICD-10-CM | POA: Diagnosis present

## 2024-02-24 DIAGNOSIS — F410 Panic disorder [episodic paroxysmal anxiety] without agoraphobia: Secondary | ICD-10-CM | POA: Diagnosis present

## 2024-02-24 DIAGNOSIS — I69119 Unspecified symptoms and signs involving cognitive functions following nontraumatic intracerebral hemorrhage: Secondary | ICD-10-CM | POA: Diagnosis not present

## 2024-02-24 HISTORY — PX: CRANIOTOMY: SHX93

## 2024-02-24 HISTORY — DX: Major depressive disorder, single episode, unspecified: F32.9

## 2024-02-24 HISTORY — DX: Hereditary hemochromatosis: E83.110

## 2024-02-24 LAB — COMPREHENSIVE METABOLIC PANEL WITH GFR
ALT: 11 U/L (ref 0–44)
AST: 10 U/L — ABNORMAL LOW (ref 15–41)
Albumin: 4.3 g/dL (ref 3.5–5.0)
Alkaline Phosphatase: 71 U/L (ref 38–126)
Anion gap: 10 (ref 5–15)
BUN: 17 mg/dL (ref 8–23)
CO2: 24 mmol/L (ref 22–32)
Calcium: 9.3 mg/dL (ref 8.9–10.3)
Chloride: 103 mmol/L (ref 98–111)
Creatinine, Ser: 1.1 mg/dL (ref 0.61–1.24)
GFR, Estimated: >60 mL/min (ref >60)
Glucose, Bld: 114 mg/dL — ABNORMAL HIGH (ref 70–99)
Potassium: 4.1 mmol/L (ref 3.5–5.1)
Sodium: 137 mmol/L (ref 135–145)
Total Bilirubin: 0.6 mg/dL (ref 0.0–1.2)
Total Protein: 7.6 g/dL (ref 6.5–8.1)

## 2024-02-24 LAB — DIFFERENTIAL
Abs Immature Granulocytes: 0.01 10*3/uL (ref 0.00–0.07)
Basophils Absolute: 0.1 10*3/uL (ref 0.0–0.1)
Basophils Relative: 1 %
Eosinophils Absolute: 0.1 10*3/uL (ref 0.0–0.5)
Eosinophils Relative: 1 %
Immature Granulocytes: 0 %
Lymphocytes Relative: 21 %
Lymphs Abs: 1.8 10*3/uL (ref 0.7–4.0)
Monocytes Absolute: 0.6 10*3/uL (ref 0.1–1.0)
Monocytes Relative: 7 %
Neutro Abs: 6.2 10*3/uL (ref 1.7–7.7)
Neutrophils Relative %: 70 %

## 2024-02-24 LAB — CBC
HCT: 47.9 % (ref 39.0–52.0)
Hemoglobin: 16.4 g/dL (ref 13.0–17.0)
MCH: 30.4 pg (ref 26.0–34.0)
MCHC: 34.2 g/dL (ref 30.0–36.0)
MCV: 88.9 fL (ref 80.0–100.0)
Platelets: 256 10*3/uL (ref 150–400)
RBC: 5.39 MIL/uL (ref 4.22–5.81)
RDW: 12.7 % (ref 11.5–15.5)
WBC: 8.8 10*3/uL (ref 4.0–10.5)
nRBC: 0 % (ref 0.0–0.2)

## 2024-02-24 LAB — GLUCOSE, CAPILLARY: Glucose-Capillary: 112 mg/dL — ABNORMAL HIGH (ref 70–99)

## 2024-02-24 LAB — PROTIME-INR
INR: 1.1 (ref 0.8–1.2)
Prothrombin Time: 14.2 s (ref 11.4–15.2)

## 2024-02-24 LAB — MRSA NEXT GEN BY PCR, NASAL: MRSA by PCR Next Gen: NOT DETECTED

## 2024-02-24 LAB — ABO/RH: ABO/RH(D): A POS

## 2024-02-24 LAB — CBG MONITORING, ED: Glucose-Capillary: 115 mg/dL — ABNORMAL HIGH (ref 70–99)

## 2024-02-24 LAB — ETHANOL: Alcohol, Ethyl (B): <10 mg/dL (ref <10)

## 2024-02-24 LAB — APTT: aPTT: 28 s (ref 24–36)

## 2024-02-24 LAB — TYPE AND SCREEN
ABO/RH(D): A POS
Antibody Screen: NEGATIVE

## 2024-02-24 SURGERY — CRANIOTOMY HEMATOMA EVACUATION SUBDURAL
Anesthesia: General | Site: Head | Laterality: Left

## 2024-02-24 MED ORDER — DEXAMETHASONE SODIUM PHOSPHATE 10 MG/ML IJ SOLN
6.0000 mg | Freq: Four times a day (QID) | INTRAMUSCULAR | Status: AC
  Administered 2024-02-24 – 2024-02-25 (×4): 6 mg via INTRAVENOUS
  Filled 2024-02-24 (×4): qty 1

## 2024-02-24 MED ORDER — PROMETHAZINE HCL 25 MG PO TABS: 12.5000 mg | ORAL_TABLET | ORAL | Status: DC | PRN

## 2024-02-24 MED ORDER — SODIUM CHLORIDE 0.9 % IV SOLN: INTRAVENOUS | Status: DC

## 2024-02-24 MED ORDER — ACETAMINOPHEN 325 MG PO TABS
650.0000 mg | ORAL_TABLET | ORAL | Status: DC | PRN
  Administered 2024-02-28 – 2024-03-02 (×5): 650 mg via ORAL
  Filled 2024-02-24 (×5): qty 2

## 2024-02-24 MED ORDER — HYDROCHLOROTHIAZIDE 12.5 MG PO TABS
12.5000 mg | ORAL_TABLET | Freq: Every day | ORAL | Status: DC
Start: 2024-02-25 — End: 2024-03-03
  Administered 2024-02-25 – 2024-03-03 (×8): 12.5 mg via ORAL
  Filled 2024-02-24 (×8): qty 1

## 2024-02-24 MED ORDER — LIDOCAINE 2% (20 MG/ML) 5 ML SYRINGE
INTRAMUSCULAR | Status: DC | PRN
  Administered 2024-02-24: 40 mg via INTRAVENOUS

## 2024-02-24 MED ORDER — ONDANSETRON HCL 4 MG PO TABS: 4.0000 mg | ORAL_TABLET | ORAL | Status: DC | PRN

## 2024-02-24 MED ORDER — DEXAMETHASONE SODIUM PHOSPHATE 10 MG/ML IJ SOLN
INTRAMUSCULAR | Status: DC | PRN
  Administered 2024-02-24: 10 mg via INTRAVENOUS

## 2024-02-24 MED ORDER — POTASSIUM CHLORIDE IN NACL 20-0.9 MEQ/L-% IV SOLN
INTRAVENOUS | Status: AC
  Filled 2024-02-24 (×3): qty 1000

## 2024-02-24 MED ORDER — BACITRACIN ZINC 500 UNIT/GM EX OINT
TOPICAL_OINTMENT | CUTANEOUS | Status: AC
  Filled 2024-02-24: qty 28.35

## 2024-02-24 MED ORDER — CHLORHEXIDINE GLUCONATE CLOTH 2 % EX PADS
6.0000 | MEDICATED_PAD | Freq: Every day | CUTANEOUS | Status: DC
  Administered 2024-02-24 – 2024-02-29 (×4): 6 via TOPICAL

## 2024-02-24 MED ORDER — ONDANSETRON HCL 4 MG/2ML IJ SOLN
INTRAMUSCULAR | Status: DC | PRN
  Administered 2024-02-24: 4 mg via INTRAVENOUS

## 2024-02-24 MED ORDER — LACTATED RINGERS IV SOLN: INTRAVENOUS | Status: DC | PRN

## 2024-02-24 MED ORDER — CHLORHEXIDINE GLUCONATE 0.12 % MT SOLN: 15.0000 mL | Freq: Once | OROMUCOSAL | Status: AC

## 2024-02-24 MED ORDER — THROMBIN 5000 UNITS EX KIT
PACK | CUTANEOUS | Status: AC
  Filled 2024-02-24: qty 1

## 2024-02-24 MED ORDER — ESMOLOL HCL 100 MG/10ML IV SOLN
INTRAVENOUS | Status: DC | PRN
  Administered 2024-02-24 (×2): 50 mg via INTRAVENOUS

## 2024-02-24 MED ORDER — ONDANSETRON HCL 4 MG/2ML IJ SOLN: 4.0000 mg | INTRAMUSCULAR | Status: DC | PRN

## 2024-02-24 MED ORDER — SODIUM CHLORIDE 0.9 % IV SOLN
INTRAVENOUS | Status: DC | PRN
Start: 2024-02-24 — End: 2024-02-24

## 2024-02-24 MED ORDER — DEXAMETHASONE SODIUM PHOSPHATE 4 MG/ML IJ SOLN
4.0000 mg | Freq: Three times a day (TID) | INTRAMUSCULAR | Status: DC
  Administered 2024-02-26 – 2024-03-03 (×17): 4 mg via INTRAVENOUS
  Filled 2024-02-24 (×18): qty 1

## 2024-02-24 MED ORDER — FENTANYL CITRATE (PF) 250 MCG/5ML IJ SOLN
INTRAMUSCULAR | Status: AC
Start: 2024-02-24 — End: ?
  Filled 2024-02-24: qty 5

## 2024-02-24 MED ORDER — FENTANYL CITRATE (PF) 100 MCG/2ML IJ SOLN: 25.0000 ug | INTRAMUSCULAR | Status: DC | PRN

## 2024-02-24 MED ORDER — ORAL CARE MOUTH RINSE: 15.0000 mL | OROMUCOSAL | Status: DC | PRN

## 2024-02-24 MED ORDER — OXYCODONE HCL 5 MG PO TABS: 5.0000 mg | ORAL_TABLET | Freq: Once | ORAL | Status: DC | PRN

## 2024-02-24 MED ORDER — IRBESARTAN 150 MG PO TABS
75.0000 mg | ORAL_TABLET | Freq: Every day | ORAL | Status: DC
  Administered 2024-02-25 – 2024-03-03 (×8): 75 mg via ORAL
  Filled 2024-02-24 (×8): qty 1

## 2024-02-24 MED ORDER — LORAZEPAM 2 MG/ML IJ SOLN
1.0000 mg | Freq: Once | INTRAMUSCULAR | Status: AC
  Administered 2024-02-24: 1 mg via INTRAVENOUS
  Filled 2024-02-24: qty 1

## 2024-02-24 MED ORDER — VALSARTAN-HYDROCHLOROTHIAZIDE 80-12.5 MG PO TABS: 1.0000 | ORAL_TABLET | Freq: Every day | ORAL | Status: DC

## 2024-02-24 MED ORDER — THROMBIN 5000 UNITS EX SOLR
OROMUCOSAL | Status: DC | PRN
  Administered 2024-02-24: 5 mL via TOPICAL

## 2024-02-24 MED ORDER — CHLORHEXIDINE GLUCONATE 0.12 % MT SOLN
OROMUCOSAL | Status: AC
  Administered 2024-02-24: 15 mL via OROMUCOSAL
  Filled 2024-02-24: qty 15

## 2024-02-24 MED ORDER — ACETAMINOPHEN 650 MG RE SUPP: 650.0000 mg | RECTAL | Status: DC | PRN

## 2024-02-24 MED ORDER — PANTOPRAZOLE SODIUM 40 MG IV SOLR
40.0000 mg | Freq: Every day | INTRAVENOUS | Status: DC
  Administered 2024-02-24 – 2024-02-27 (×4): 40 mg via INTRAVENOUS
  Filled 2024-02-24 (×4): qty 10

## 2024-02-24 MED ORDER — CEFAZOLIN SODIUM-DEXTROSE 1-4 GM/50ML-% IV SOLN
1.0000 g | Freq: Three times a day (TID) | INTRAVENOUS | Status: AC
  Administered 2024-02-24 – 2024-02-25 (×2): 1 g via INTRAVENOUS
  Filled 2024-02-24 (×2): qty 50

## 2024-02-24 MED ORDER — ROCURONIUM BROMIDE 10 MG/ML (PF) SYRINGE
PREFILLED_SYRINGE | INTRAVENOUS | Status: DC | PRN
  Administered 2024-02-24: 30 mg via INTRAVENOUS
  Administered 2024-02-24: 20 mg via INTRAVENOUS
  Administered 2024-02-24: 50 mg via INTRAVENOUS

## 2024-02-24 MED ORDER — AMISULPRIDE (ANTIEMETIC) 5 MG/2ML IV SOLN: 10.0000 mg | Freq: Once | INTRAVENOUS | Status: DC | PRN

## 2024-02-24 MED ORDER — BUPROPION HCL ER (XL) 150 MG PO TB24
300.0000 mg | ORAL_TABLET | Freq: Every day | ORAL | Status: DC
Start: 2024-02-24 — End: 2024-03-03
  Administered 2024-02-25 – 2024-03-03 (×8): 300 mg via ORAL
  Filled 2024-02-24 (×4): qty 2
  Filled 2024-02-24 (×2): qty 1
  Filled 2024-02-24 (×2): qty 2

## 2024-02-24 MED ORDER — IOHEXOL 350 MG/ML SOLN
100.0000 mL | Freq: Once | INTRAVENOUS | Status: AC | PRN
  Administered 2024-02-24: 100 mL via INTRAVENOUS

## 2024-02-24 MED ORDER — FENTANYL CITRATE (PF) 250 MCG/5ML IJ SOLN
INTRAMUSCULAR | Status: DC | PRN
  Administered 2024-02-24 (×2): 50 ug via INTRAVENOUS
  Administered 2024-02-24: 100 ug via INTRAVENOUS
  Administered 2024-02-24: 50 ug via INTRAVENOUS

## 2024-02-24 MED ORDER — PROPOFOL 10 MG/ML IV BOLUS
INTRAVENOUS | Status: DC | PRN
  Administered 2024-02-24: 50 mg via INTRAVENOUS
  Administered 2024-02-24: 150 mg via INTRAVENOUS

## 2024-02-24 MED ORDER — CEFAZOLIN SODIUM-DEXTROSE 2-3 GM-%(50ML) IV SOLR
INTRAVENOUS | Status: DC | PRN
  Administered 2024-02-24: 2 g via INTRAVENOUS

## 2024-02-24 MED ORDER — SENNA 8.6 MG PO TABS
1.0000 | ORAL_TABLET | Freq: Two times a day (BID) | ORAL | Status: DC
  Administered 2024-02-24 – 2024-03-03 (×16): 8.6 mg via ORAL
  Filled 2024-02-24 (×16): qty 1

## 2024-02-24 MED ORDER — SODIUM CHLORIDE 0.9 % IV SOLN: 12.5000 mg | INTRAVENOUS | Status: DC | PRN

## 2024-02-24 MED ORDER — ATOMOXETINE HCL 40 MG PO CAPS
80.0000 mg | ORAL_CAPSULE | Freq: Every day | ORAL | Status: DC
  Administered 2024-02-25 – 2024-03-03 (×8): 80 mg via ORAL
  Filled 2024-02-24 (×8): qty 2

## 2024-02-24 MED ORDER — OXYCODONE HCL 5 MG/5ML PO SOLN: 5.0000 mg | Freq: Once | ORAL | Status: DC | PRN

## 2024-02-24 MED ORDER — 0.9 % SODIUM CHLORIDE (POUR BTL) OPTIME
TOPICAL | Status: DC | PRN
  Administered 2024-02-24: 3000 mL

## 2024-02-24 MED ORDER — LABETALOL HCL 5 MG/ML IV SOLN
INTRAVENOUS | Status: DC | PRN
  Administered 2024-02-24 (×2): 5 mg via INTRAVENOUS

## 2024-02-24 MED ORDER — LEVETIRACETAM IN NACL 500 MG/100ML IV SOLN
500.0000 mg | Freq: Two times a day (BID) | INTRAVENOUS | Status: DC
Start: 2024-02-24 — End: 2024-02-28
  Administered 2024-02-24 – 2024-02-28 (×8): 500 mg via INTRAVENOUS
  Filled 2024-02-24 (×8): qty 100

## 2024-02-24 MED ORDER — THROMBIN 20000 UNITS EX SOLR
CUTANEOUS | Status: AC
  Filled 2024-02-24: qty 20000

## 2024-02-24 MED ORDER — ORAL CARE MOUTH RINSE: 15.0000 mL | Freq: Once | OROMUCOSAL | Status: AC

## 2024-02-24 MED ORDER — LIDOCAINE-EPINEPHRINE 1 %-1:100000 IJ SOLN
INTRAMUSCULAR | Status: AC
  Filled 2024-02-24: qty 1

## 2024-02-24 MED ORDER — LABETALOL HCL 5 MG/ML IV SOLN: 10.0000 mg | INTRAVENOUS | Status: DC | PRN

## 2024-02-24 MED ORDER — SUGAMMADEX SODIUM 200 MG/2ML IV SOLN
INTRAVENOUS | Status: DC | PRN
  Administered 2024-02-24 (×4): 50 mg via INTRAVENOUS

## 2024-02-24 MED ORDER — THROMBIN 20000 UNITS EX SOLR
CUTANEOUS | Status: DC | PRN
  Administered 2024-02-24: 20 mL via TOPICAL

## 2024-02-24 MED ORDER — DEXAMETHASONE SODIUM PHOSPHATE 4 MG/ML IJ SOLN
4.0000 mg | Freq: Four times a day (QID) | INTRAMUSCULAR | Status: AC
  Administered 2024-02-25 – 2024-02-26 (×4): 4 mg via INTRAVENOUS
  Filled 2024-02-24 (×3): qty 1

## 2024-02-24 MED ORDER — MORPHINE SULFATE (PF) 2 MG/ML IV SOLN: 1.0000 mg | INTRAVENOUS | Status: DC | PRN

## 2024-02-24 MED ORDER — PHENYLEPHRINE 80 MCG/ML (10ML) SYRINGE FOR IV PUSH (FOR BLOOD PRESSURE SUPPORT)
PREFILLED_SYRINGE | INTRAVENOUS | Status: DC | PRN
  Administered 2024-02-24 (×2): 40 ug via INTRAVENOUS

## 2024-02-24 MED ORDER — PROPOFOL 10 MG/ML IV BOLUS
INTRAVENOUS | Status: AC
  Filled 2024-02-24: qty 20

## 2024-02-24 MED ORDER — HYDROCODONE-ACETAMINOPHEN 5-325 MG PO TABS
1.0000 | ORAL_TABLET | ORAL | Status: DC | PRN
  Administered 2024-02-26 – 2024-02-27 (×2): 1 via ORAL
  Filled 2024-02-24 (×2): qty 1

## 2024-02-24 MED ORDER — LIDOCAINE-EPINEPHRINE 1 %-1:100000 IJ SOLN
INTRAMUSCULAR | Status: DC | PRN
  Administered 2024-02-24: 10 mL

## 2024-02-24 SURGICAL SUPPLY — 53 items
BAG COUNTER SPONGE SURGICOUNT (BAG) ×2 IMPLANT
BUR SPIRAL ROUTER 2.3 (BUR) ×2 IMPLANT
CANISTER SUCT 3000ML PPV (MISCELLANEOUS) ×2 IMPLANT
CATH VENTRIC 35X38 W/TROCAR LG (CATHETERS) IMPLANT
CLIP TI MEDIUM 6 (CLIP) IMPLANT
DRAPE MICROSCOPE SLANT 54X150 (MISCELLANEOUS) IMPLANT
DRAPE NEUROLOGICAL W/INCISE (DRAPES) ×2 IMPLANT
DRAPE SURG 17X23 STRL (DRAPES) IMPLANT
DRAPE WARM FLUID 44X44 (DRAPES) ×2 IMPLANT
DURAPREP 6ML APPLICATOR 50/CS (WOUND CARE) ×2 IMPLANT
ELECT REM PT RETURN 9FT ADLT (ELECTROSURGICAL) ×1 IMPLANT
ELECTRODE REM PT RTRN 9FT ADLT (ELECTROSURGICAL) ×2 IMPLANT
EVACUATOR 1/8 PVC DRAIN (DRAIN) IMPLANT
GAUZE 4X4 16PLY ~~LOC~~+RFID DBL (SPONGE) IMPLANT
GAUZE SPONGE 4X4 12PLY STRL (GAUZE/BANDAGES/DRESSINGS) ×2 IMPLANT
GAUZE SPONGE 4X4 12PLY STRL LF (GAUZE/BANDAGES/DRESSINGS) IMPLANT
GLOVE BIO SURGEON STRL SZ7 (GLOVE) ×2 IMPLANT
GLOVE BIO SURGEON STRL SZ8 (GLOVE) ×2 IMPLANT
GLOVE BIOGEL PI IND STRL 7.0 (GLOVE) ×2 IMPLANT
GOWN STRL REUS W/ TWL LRG LVL3 (GOWN DISPOSABLE) ×2 IMPLANT
GOWN STRL REUS W/ TWL XL LVL3 (GOWN DISPOSABLE) ×2 IMPLANT
GOWN STRL REUS W/TWL 2XL LVL3 (GOWN DISPOSABLE) IMPLANT
HEMOSTAT POWDER KIT SURGIFOAM (HEMOSTASIS) ×2 IMPLANT
KIT BASIN OR (CUSTOM PROCEDURE TRAY) ×2 IMPLANT
KIT DRAIN CSF ACCUDRAIN (MISCELLANEOUS) IMPLANT
KIT TURNOVER KIT B (KITS) ×2 IMPLANT
NDL HYPO 22X1.5 SAFETY MO (MISCELLANEOUS) ×2 IMPLANT
NEEDLE HYPO 22X1.5 SAFETY MO (MISCELLANEOUS) ×1 IMPLANT
NS IRRIG 1000ML POUR BTL (IV SOLUTION) ×2 IMPLANT
PACK CRANIOTOMY CUSTOM (CUSTOM PROCEDURE TRAY) ×2 IMPLANT
PAD ARMBOARD POSITIONER FOAM (MISCELLANEOUS) ×2 IMPLANT
PATTIES SURGICAL .25X.25 (GAUZE/BANDAGES/DRESSINGS) IMPLANT
PATTIES SURGICAL .5 X.5 (GAUZE/BANDAGES/DRESSINGS) IMPLANT
PATTIES SURGICAL .5 X3 (DISPOSABLE) IMPLANT
PATTIES SURGICAL 1X1 (DISPOSABLE) IMPLANT
PERFORATOR LRG 14-11MM (BIT) ×2 IMPLANT
PIN MAYFIELD SKULL DISP (PIN) IMPLANT
PLATE CRANIAL 12 2H RIGID UNI (Plate) IMPLANT
SCREW UNIII AXS SD 1.5X4 (Screw) IMPLANT
SPONGE NEURO XRAY DETECT 1X3 (DISPOSABLE) IMPLANT
SPONGE SURGIFOAM ABS GEL 100 (HEMOSTASIS) ×2 IMPLANT
STAPLER VISISTAT (STAPLE) ×2 IMPLANT
SUT 3-0 BLK 1X30 PSL (SUTURE) IMPLANT
SUT NURALON 4 0 TR CR/8 (SUTURE) ×4 IMPLANT
SUT VIC AB 2-0 CP2 18 (SUTURE) ×2 IMPLANT
SYR CONTROL 10ML LL (SYRINGE) ×2 IMPLANT
TAPE CLOTH SURG 4X10 WHT LF (GAUZE/BANDAGES/DRESSINGS) IMPLANT
TOWEL GREEN STERILE (TOWEL DISPOSABLE) ×2 IMPLANT
TOWEL GREEN STERILE FF (TOWEL DISPOSABLE) ×2 IMPLANT
TRAY FOLEY MTR SLVR 16FR STAT (SET/KITS/TRAYS/PACK) ×2 IMPLANT
TUBING FEATHERFLOW (TUBING) IMPLANT
UNDERPAD 30X36 HEAVY ABSORB (UNDERPADS AND DIAPERS) IMPLANT
WATER STERILE IRR 1000ML POUR (IV SOLUTION) ×2 IMPLANT

## 2024-02-24 NOTE — Anesthesia Postprocedure Evaluation (Signed)
 Anesthesia Post Note  Patient: Micheal Ayers  Procedure(s) Performed: CRANIOTOMY HEMATOMA EVACUATION SUBDURAL (Left: Head)     Patient location during evaluation: PACU Anesthesia Type: General Level of consciousness: awake and alert Pain management: pain level controlled Vital Signs Assessment: post-procedure vital signs reviewed and stable Respiratory status: spontaneous breathing, nonlabored ventilation and respiratory function stable Cardiovascular status: stable and blood pressure returned to baseline Anesthetic complications: no  No notable events documented.  Last Vitals:  Vitals:   02/24/24 1406 02/24/24 1430  BP: (!) 108/94 107/82  Pulse:  68  Resp: 13 10  Temp: (!) 36.4 C   SpO2: 96% 96%    Last Pain:  Vitals:   02/24/24 1128  TempSrc: (P) Oral                 Beryle Lathe

## 2024-02-24 NOTE — Op Note (Signed)
 02/24/2024  2:02 PM  PATIENT:  Micheal Ayers  63 y.o. male  PRE-OPERATIVE DIAGNOSIS: Large left chronic subdural hematoma  POST-OPERATIVE DIAGNOSIS:  same  PROCEDURE: Left frontotemporoparietal craniotomy for evacuation of large left chronic subdural hematoma  SURGEON:  Marikay Alar, MD  ASSISTANTS: Verlin Dike, FNP  ANESTHESIA:   General  EBL: 50 ml  Total I/O In: -  Out: 500 [Urine:450; Blood:50]  BLOOD ADMINISTERED: none  DRAINS: Subdural drain and subgaleal drain  SPECIMEN:  none  INDICATION FOR PROCEDURE: This patient presented with right facial droop and difficulty with speech. Imaging showed a large left chronic subdural hematoma. Recommended a left craniotomy for evacuation of subdural hematoma. Patient understood the risks, benefits, and alternatives and potential outcomes and wished to proceed.  PROCEDURE DETAILS: The patient was taken to the operating room and after induction of adequate generalized endotracheal anesthesia, the head was affixed in a 3 point Mayfield head rest, and turned to the right to expose the left frontotemporal parietal region. The head was shaved and then cleaned and then prepped with DuraPrep and draped in the usual sterile fashion. 10 cc of local anesthetic was injected, and a curvilinear incision was made on the left of the head on the superior temporal line. Raney clips were placed to establish hemostasis of the scalp, the muscle was reflected with the scalp flap, to expose the left frontotemporal parietal region. A burr hole was placed, and a craniotomy flap was turned utilizing the high-speed, air powered drill. The flap was then placed in bacitracin-containing saline solution, and the dura was opened to expose a large left chronic subdural hematoma multiple membranes. A hematoma was then removed with a combination of irrigation and suction.  Membranes were coagulated and fenestrated.  I continued to irrigate until the irrigant was clear  to, and dried any bleeding with bipolar cautery. I then placed a subdural drain through separate stab incision and close the dura with a running 4-0 Nurolon suture. Dural tack up sutures were placed. The dura was lined with Gelfoam, and the craniotomy flap was replaced with doggie-bone plates. The wound was copiously irrigated. A subgaleal drain was placed, and the galea was then closed with interrupted 2-0 Vicryl suture. The skin was then closed with staples a sterile dressing was applied. The patient was then taken out of the 3-point Mayfield headrest and awakened from general anesthesia, and transported to the recovery room in stable condition. At the end of the procedure all sponge, needle, and instrument counts were correct.    PLAN OF CARE: Admit to inpatient   PATIENT DISPOSITION:  PACU - hemodynamically stable.   Delay start of Pharmacological VTE agent (>24hrs) due to surgical blood loss or risk of bleeding:  yes

## 2024-02-24 NOTE — Transfer of Care (Signed)
 Immediate Anesthesia Transfer of Care Note  Patient: Micheal Ayers  Procedure(s) Performed: CRANIOTOMY HEMATOMA EVACUATION SUBDURAL (Left: Head)  Patient Location: PACU  Anesthesia Type:General  Level of Consciousness: awake, oriented, patient cooperative, and responds to stimulation  Airway & Oxygen Therapy: Patient Spontanous Breathing and Patient connected to face mask oxygen  Post-op Assessment: Report given to RN and Post -op Vital signs reviewed and stable  Post vital signs: Reviewed and stable  Last Vitals:  Vitals Value Taken Time  BP 108/94 02/24/24 1408  Temp    Pulse 76 02/24/24 1414  Resp 16 02/24/24 1414  SpO2 96 % 02/24/24 1414  Vitals shown include unfiled device data.  Last Pain:  Vitals:   02/24/24 1128  TempSrc: (P) Oral         Complications: No notable events documented.

## 2024-02-24 NOTE — H&P (Signed)
 Subjective: Micheal Ayers is a 63 y.o. male admitted for left chronic subdural hematoma. Onset of symptoms was 2 days ago, stable since that time.  The pain is rated none.  He denies trauma or head injury or falls.  He has had 2 days worth of right facial droop and some difficulty with word finding and decreased speech output.  MRI or CT showed large left chronic subdural hematoma.  Right parietal shunt is in place.  He had a right parietal shunt placed by Dr. Jordan Likes 1 year ago.  Past Medical History:  Diagnosis Date   ADD (attention deficit disorder)    Allergic rhinitis, cause unspecified    Chronic kidney disease    Acute renal failure   Hypertension    Stroke [facility])     Past Surgical History:  Procedure Laterality Date   elbow surgury  1969   left elbow   INGUINAL HERNIA REPAIR  2003   left   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/2023   Procedure: Shunt Placment - right occipital;  Surgeon: Julio Sicks, MD;  Location: [facility] OR;  Service: Neurosurgery;  Laterality: Right;    Prior to Admission medications   Medication Sig Start Date End Date Taking? Authorizing Provider  aspirin EC 81 MG tablet Take 1 tablet (81 mg total) by mouth daily. Swallow whole. 02/22/23   Hollice Espy, MD  atomoxetine (STRATTERA) 80 MG capsule Take 1 capsule (80 mg total) by mouth daily. 10/03/23   Mozingo, Thereasa Solo, NP  buPROPion (WELLBUTRIN XL) 300 MG 24 hr tablet Take 1 tablet (300 mg total) by mouth daily. 11/29/23   Mozingo, Thereasa Solo, NP  Cholecalciferol (VITAMIN D3) 50 MCG (2000 UT) CAPS Take 2,000 Units by mouth daily.    [provider]  cyanocobalamin (VITAMIN B12) 1000 MCG tablet Take 1,000 mcg by mouth daily.    [provider]  omeprazole (PRILOSEC OTC) 20 MG tablet Take 20 mg by mouth daily.    [provider]  rosuvastatin (CRESTOR) 10 MG tablet Take 1 tablet (10 mg total) by mouth daily. 02/22/23   Hollice Espy, MD  valsartan-hydrochlorothiazide (DIOVAN-HCT)  80-12.5 MG tablet Take 1 tablet by mouth daily. 02/14/23   [provider]   Allergies  Allergen Reactions   Erythromycin Hives   Erythromycin Base     Social History   Tobacco Use   Smoking status: Former   Smokeless tobacco: Never  Substance Use Topics   Alcohol use: Yes    Comment: social    Family History  Problem Relation Age of Onset   Hypertension Other    Alcohol abuse Other    Hypertension Other    Kidney disease Other    Cancer Other    Alcohol abuse Other    Arthritis Other    Stroke Other    Hypertension Other    Cancer Other      Review of Systems  Positive ROS: neg  All other systems have been reviewed and were otherwise negative with the exception of those mentioned in the HPI and as above.  Objective: Vital signs in last 24 hours: Temp:  [98 F (36.7 C)] 98 F (36.7 C) (03/30 1046) Pulse Rate:  [76-91] 76 (03/30 1048) Resp:  [13-20] 13 (03/30 1048) BP: (130-138)/(76-95) 130/76 (03/30 1047) SpO2:  [99 %-100 %] 99 % (03/30 1048) Weight:  [107.1 kg] 107.1 kg (03/30 0943)  General Appearance: Alert, cooperative, no distress, appears stated age Head: Normocephalic, without obvious abnormality, atraumatic Eyes: PERRL, conjunctiva/corneas  clear, EOM's intact    Neck: Supple, symmetrical, trachea midline Back: Symmetric, no curvature, ROM normal, no CVA tenderness Lungs:  respirations unlabored Heart: Regular rate and rhythm Abdomen: Soft, non-tender Extremities: Extremities normal, atraumatic, no cyanosis or edema Pulses: 2+ and symmetric all extremities Skin: Skin color, texture, turgor normal, no rashes or lesions  NEUROLOGIC:   Mental status: Alert and can state his name and can name my watch.  Seems to have decent attention span. Motor Exam - grossly normal with may be mild pronator drift on the right Sensory Exam - grossly normal Reflexes: Normal Coordination -not tested Gait -not tested Balance -not tested Cranial Nerves: I:  smell Not tested  II: visual acuity  OS: nl    OD: nl  II: visual fields Full to confrontation  II: pupils Equal, round, reactive to light  III,VII: ptosis None  III,IV,VI: extraocular muscles  Full ROM  V: mastication Normal  V: facial light touch sensation  Normal  V,VII: corneal reflex  Present  VII: facial muscle function - upper  Normal  VII: facial muscle function - lower Normal  VIII: hearing Not tested  IX: soft palate elevation  Normal  IX,X: gag reflex Present  XI: trapezius strength  5/5  XI: sternocleidomastoid strength 5/5  XI: neck flexion strength  5/5  XII: tongue strength  Normal    Data Review Lab Results  Component Value Date   WBC 8.8 02/24/2024   HGB 16.4 02/24/2024   HCT 47.9 02/24/2024   MCV 88.9 02/24/2024   PLT 256 02/24/2024   Lab Results  Component Value Date   NA 137 02/24/2024   K 4.1 02/24/2024   CL 103 02/24/2024   CO2 24 02/24/2024   BUN 17 02/24/2024   CREATININE 1.10 02/24/2024   GLUCOSE 114 (H) 02/24/2024   Lab Results  Component Value Date   INR 1.1 02/24/2024    Assessment/Plan:  Estimated body mass index is 32.02 kg/m as calculated from the following:   Height as of this encounter: 6' (1.829 m).   Weight as of this encounter: 107.1 kg. Micheal Ayers admitted for left craniotomy for subdural hematoma.  He reports taking no anticoagulants or antiplatelet agents..  I explained the condition and procedure to the Micheal Ayers and answered any questions.  He understands the risk of the surgery include but are not limited to bleeding, infection, stroke, loss of speech, loss of vision, numbness, weakness, paralysis, loss of bowel bladder or sexual function, need for further surgery, reaccumulation, lack of relief of symptoms, worsening symptoms, and anesthesia risks.  Micheal Ayers wishes to proceed with procedure as planned. Understands risks/ benefits and typical outcomes of procedure.   Tia Alert 02/24/2024 11:00 AM

## 2024-02-24 NOTE — Consult Note (Signed)
 1610 Code stroke cart activated/ Dr Rush Landmark at bedside.  Per report, pt LKW [id number] on 02/23/2024.  Pt having R sided facial droop and aphasia.  MRS 0 [phone] Dr Selina Cooley paged/ patient taken to CT 0946 Dr Selina Cooley called back regarding patient 0956 Dr Selina Cooley called back after seeing NCCT with subdural hematoma and canceled code stroke.

## 2024-02-24 NOTE — Anesthesia Preprocedure Evaluation (Addendum)
 Anesthesia Evaluation  Patient identified by MRN, date of birth, ID band Patient awake    Reviewed: Allergy & Precautions, NPO status , Patient's Chart, lab work & pertinent test results  History of Anesthesia Complications Negative for: history of anesthetic complications  Airway Mallampati: III  TM Distance: >3 FB Neck ROM: Full    Dental  (+) Dental Advisory Given, Teeth Intact   Pulmonary former smoker   Pulmonary exam normal        Cardiovascular hypertension, Pt. on medications Normal cardiovascular exam     Neuro/Psych  SDH  CT Head - IMPRESSION: 1. Massive subdural hematoma on the left, primarily low-density with signs of septation. Maximal thickness along the left frontal convexity measures nearly 5 cm with 1.5 cm of midline shift. 2. Small subacute appearing right parafalcine subdural hematoma measuring up to 5 mm in thickness. 3. VP shunt. Decrease in ventricular volume least partially from the mass effect but there may also be increased drainage.   CVA  negative psych ROS   GI/Hepatic negative GI ROS, Neg liver ROS,,,  Endo/Other   Obesity   Renal/GU negative Renal ROS     Musculoskeletal negative musculoskeletal ROS (+)    Abdominal   Peds  (+) ATTENTION DEFICIT DISORDER WITHOUT HYPERACTIVITY Hematology negative hematology ROS (+)   Anesthesia Other Findings   Reproductive/Obstetrics                             Anesthesia Physical Anesthesia Plan  ASA: 4 and emergent  Anesthesia Plan: General   Post-op Pain Management: Ofirmev IV (intra-op)*   Induction: Intravenous  PONV Risk Score and Plan: 2 and Treatment may vary due to age or medical condition, Ondansetron and Dexamethasone  Airway Management Planned: Oral ETT  Additional Equipment: Arterial line  Intra-op Plan:   Post-operative Plan: Possible Post-op intubation/ventilation  Informed Consent: I  have reviewed the patients History and Physical, chart, labs and discussed the procedure including the risks, benefits and alternatives for the proposed anesthesia with the patient or authorized representative who has indicated his/her understanding and acceptance.     Dental advisory given  Plan Discussed with: CRNA and Anesthesiologist  Anesthesia Plan Comments:         Anesthesia Quick Evaluation

## 2024-02-24 NOTE — ED Notes (Signed)
 RN at bedside whole time. Neurosurgeon to see at bedside.

## 2024-02-24 NOTE — ED Notes (Signed)
 Called [facility], requesting Ed to Ed tx via [facility]; accepting physician: Margarita Grizzle, MD

## 2024-02-24 NOTE — Anesthesia Procedure Notes (Signed)
 Procedure Name: Intubation Date/Time: 02/24/2024 12:29 PM  Performed by: Chelsea Aus, CRNAPre-anesthesia Checklist: Patient identified, Emergency Drugs available, Suction available and Patient being monitored Patient Re-evaluated:Patient Re-evaluated prior to induction Oxygen Delivery Method: Circle system utilized Preoxygenation: Pre-oxygenation with 100% oxygen Induction Type: IV induction Ventilation: Mask ventilation without difficulty Laryngoscope Size: Mac and 4 Grade View: Grade I Tube type: Oral Tube size: 7.0 mm Number of attempts: 1 Placement Confirmation: ETT inserted through vocal cords under direct vision, positive ETCO2 and breath sounds checked- equal and bilateral Secured at: 22 cm Tube secured with: Tape Dental Injury: Teeth and Oropharynx as per pre-operative assessment

## 2024-02-24 NOTE — ED Provider Notes (Signed)
 [place] EMERGENCY DEPARTMENT AT [facility] Provider Note   CSN: [id number] Arrival date & time: 02/24/24  0350  An emergency department physician performed an initial assessment on this suspected stroke patient at 15.  History  Chief Complaint  Patient presents with   Slurred Speech   Numbness    Micheal Ayers is a 63 y.o. male.  The history is provided by the patient, the spouse and medical records. No language interpreter was used.  Neurologic Problem This is a new problem. The current episode started 12 to 24 hours ago. The problem occurs constantly. The problem has not changed since onset.Pertinent negatives include no chest pain, no abdominal pain, no headaches and no shortness of breath. Nothing aggravates the symptoms. Nothing relieves the symptoms. He has tried nothing for the symptoms. The treatment provided no relief.       Home Medications Prior to Admission medications   Medication Sig Start Date End Date Taking? Authorizing Provider  aspirin EC 81 MG tablet Take 1 tablet (81 mg total) by mouth daily. Swallow whole. 02/22/23   Hollice Espy, MD  atomoxetine (STRATTERA) 80 MG capsule Take 1 capsule (80 mg total) by mouth daily. 10/03/23   Mozingo, Thereasa Solo, NP  buPROPion (WELLBUTRIN XL) 300 MG 24 hr tablet Take 1 tablet (300 mg total) by mouth daily. 11/29/23   Mozingo, Thereasa Solo, NP  Cholecalciferol (VITAMIN D3) 50 MCG (2000 UT) CAPS Take 2,000 Units by mouth daily.    [provider]  cyanocobalamin (VITAMIN B12) 1000 MCG tablet Take 1,000 mcg by mouth daily.    [provider]  omeprazole (PRILOSEC OTC) 20 MG tablet Take 20 mg by mouth daily.    [provider]  rosuvastatin (CRESTOR) 10 MG tablet Take 1 tablet (10 mg total) by mouth daily. 02/22/23   Hollice Espy, MD  valsartan-hydrochlorothiazide (DIOVAN-HCT) 80-12.5 MG tablet Take 1 tablet by mouth daily. 02/14/23   [provider]       Allergies    Erythromycin and Erythromycin base    Review of Systems   Review of Systems  Constitutional:  Positive for fatigue. Negative for chills and fever.  HENT:  Negative for congestion.   Eyes:  Negative for visual disturbance.  Respiratory:  Negative for cough, chest tightness and shortness of breath.   Cardiovascular:  Negative for chest pain.  Gastrointestinal:  Negative for abdominal pain, constipation, diarrhea, nausea and vomiting.  Genitourinary:  Negative for dysuria.  Musculoskeletal:  Negative for back pain and neck pain.  Skin:  Negative for rash.  Neurological:  Positive for speech difficulty and weakness. Negative for seizures, light-headedness, numbness and headaches.  Psychiatric/Behavioral:  Negative for agitation.   All other systems reviewed and are negative.   Physical Exam Updated Vital Signs BP (!) 138/95 (BP Location: Right Arm)   Pulse 91   Resp 20   Ht 6' (1.829 m)   Wt 107.1 kg   SpO2 100%   BMI 32.02 kg/m  Physical Exam Vitals and nursing note reviewed.  Constitutional:      General: He is not in acute distress.    Appearance: He is well-developed. He is not ill-appearing, toxic-appearing or diaphoretic.  HENT:     Head: Normocephalic and atraumatic.     Nose: No congestion or rhinorrhea.     Mouth/Throat:     Mouth: Mucous membranes are moist.     Pharynx: No oropharyngeal exudate or posterior oropharyngeal erythema.  Eyes:  Extraocular Movements: Extraocular movements intact.     Conjunctiva/sclera: Conjunctivae normal.     Pupils: Pupils are equal, round, and reactive to light.  Cardiovascular:     Rate and Rhythm: Normal rate and regular rhythm.     Pulses: Normal pulses.     Heart sounds: No murmur heard. Pulmonary:     Effort: Pulmonary effort is normal. No respiratory distress.     Breath sounds: Normal breath sounds. No wheezing, rhonchi or rales.  Chest:     Chest wall: No tenderness.  Abdominal:     Palpations:  Abdomen is soft.     Tenderness: There is no abdominal tenderness. There is no right CVA tenderness, left CVA tenderness, guarding or rebound.  Musculoskeletal:        General: No swelling.     Cervical back: Neck supple. No tenderness.  Skin:    General: Skin is warm and dry.     Capillary Refill: Capillary refill takes less than 2 seconds.  Neurological:     Mental Status: He is alert.     Motor: Weakness present.  Psychiatric:        Mood and Affect: Mood normal.     ED Results / Procedures / Treatments   Labs (all labs ordered are listed, but only abnormal results are displayed) Labs Reviewed  COMPREHENSIVE METABOLIC PANEL WITH GFR - Abnormal; Notable for the following components:      Result Value   Glucose, Bld 114 (*)    AST 10 (*)    All other components within normal limits  CBG MONITORING, ED - Abnormal; Notable for the following components:   Glucose-Capillary 115 (*)    All other components within normal limits  ETHANOL  PROTIME-INR  APTT  CBC  DIFFERENTIAL  RAPID URINE DRUG SCREEN, HOSP PERFORMED  URINALYSIS, ROUTINE W REFLEX MICROSCOPIC    EKG EKG Interpretation Date/Time:  Sunday February 24 2024 09:38:40 EDT Ventricular Rate:  93 PR Interval:  207 QRS Duration:  106 QT Interval:  372 QTC Calculation: 463 R Axis:   88  Text Interpretation: Sinus rhythm Borderline prolonged PR interval Inferior infarct, old when compared to prior, similar appearnce. No STEMI Confirmed by Theda Belfast ([id number]) on 02/24/2024 11:26:20 AM  Radiology CT ANGIO HEAD NECK W WO CM W PERF (CODE STROKE) Result Date: 02/24/2024 CLINICAL DATA:  Subdural EXAM: CT ANGIOGRAPHY HEAD AND NECK CT PERFUSION BRAIN TECHNIQUE: Multidetector CT imaging of the head and neck was performed using the standard protocol during bolus administration of intravenous contrast. Multiplanar CT image reconstructions and MIPs were obtained to evaluate the vascular anatomy. Carotid stenosis measurements (when  applicable) are obtained utilizing NASCET criteria, using the distal internal carotid diameter as the denominator. Multiphase CT imaging of the brain was performed following IV bolus contrast injection. Subsequent parametric perfusion maps were calculated using RAPID software. RADIATION DOSE REDUCTION: This exam was performed according to the departmental dose-optimization program which includes automated exposure control, adjustment of the mA and/or kV according to patient size and/or use of iterative reconstruction technique. CONTRAST:  OMNIPAQUE IOHEXOL 350 MG/ML SOLN COMPARISON:  02/19/2023 FINDINGS: Aortic arch: Atheromatous plaque with 3 vessel branching. Right carotid system: Limited atheromatous change. Medial outpouching from the mid ICA, proud by 1-2 mm. No dissection flap or ulceration. No significant stenosis Left carotid system: Mild mixed density plaque centered at the bifurcation without stenosis or ulceration. Mild beading and dilatation of the distal ICA measuring up to 5.5 mm. Vertebral arteries: Subclavian and  vertebral arteries are smoothly contoured and diffusely patent. Skeleton: No evidence of fracture or bone lesion. Generalized degenerative endplate and facet spurring. Other neck: No acute finding Upper chest: Clear apical lungs Review of the MIP images confirms the above findings CTA HEAD FINDINGS Anterior circulation: No significant stenosis, proximal occlusion, aneurysm, or vascular malformation. Posterior circulation: Vertebral and basilar arteries are smoothly contoured and diffusely patent. No branch occlusion, beading, or aneurysm Venous sinuses: Unremarkable for arterial timing Anatomic variants: None significant CT Brain Perfusion Findings: Artifactual tagging primarily of the large left subdural hematoma. Some compressive perfusional changes may also be present in the high left cerebral cortex. IMPRESSION: 1. No emergent arterial finding. No vascular lesion seen underlying  the subdural hemorrhage. 2. 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 Electronically Signed   By: Tiburcio Pea M.D.   On: 02/24/2024 10:17   CT HEAD CODE STROKE WO CONTRAST Result Date: 02/24/2024 CLINICAL DATA:  Code stroke.  Right facial droop and aphasia EXAM: CT HEAD WITHOUT CONTRAST TECHNIQUE: Contiguous axial images were obtained from the base of the skull through the vertex without intravenous contrast. RADIATION DOSE REDUCTION: This exam was performed according to the departmental dose-optimization program which includes automated exposure control, adjustment of the mA and/or kV according to patient size and/or use of iterative reconstruction technique. COMPARISON:  Head CT 09/12/2023 FINDINGS: Brain: Large subdural hematoma on the left with primarily iso to low density, although a few bands of high-density are present from more recent clot or septation and there is some hazy high-density layering of clot posteriorly. There is maximal thickness is at the left frontal lobe where up to the 4.9 cm thickness with midline shift measuring 1.5 cm at the anterior septum pellucidum. Especially on sagittal reformats there is sign of multi septation. There is a smaller right parafalcine subdural hematoma with layering hematocrit, a subacute feature, thickness up to 5 mm. VP shunt from right posterior approach, tip near the upper third ventricle. The ventricles are decompressed compared to prior imaging, at least somewhat due to the shift but there may also be increased drainage. Chronic infarct in the left cerebellum Vascular: No acute finding Skull: No acute finding Sinuses/Orbits: No acute finding Other: Critical Value/emergent results were called by telephone at the time of interpretation on 02/24/2024 at 10:01 am to provider [facility] , who verbally acknowledged these results. IMPRESSION: 1. Massive subdural hematoma on the left, primarily low-density with signs of septation. Maximal  thickness along the left frontal convexity measures nearly 5 cm with 1.5 cm of midline shift. 2. Small subacute appearing right parafalcine subdural hematoma measuring up to 5 mm in thickness. 3. VP shunt. Decrease in ventricular volume least partially from the mass effect but there may also be increased drainage. Electronically Signed   By: Tiburcio Pea M.D.   On: 02/24/2024 10:06    Procedures Procedures    CRITICAL CARE Performed by: Canary Brim Giovanni Biby Total critical care time: 55 minutes Critical care time was exclusive of separately billable procedures and treating other patients. Critical care was necessary to treat or prevent imminent or life-threatening deterioration. Critical care was time spent personally by me on the following activities: development of treatment plan with patient and/or surrogate as well as nursing, discussions with consultants, evaluation of patient's response to treatment, examination of patient, obtaining history from patient or surrogate, ordering and performing treatments and interventions, ordering and review of laboratory studies, ordering and review of radiographic studies, pulse oximetry and re-evaluation of patient's  condition.  Medications Ordered in ED Medications  iohexol (OMNIPAQUE) 350 MG/ML injection 100 mL (100 mLs Intravenous Contrast Given 02/24/24 0954)    ED Course/ Medical Decision Making/ A&P                                 Medical Decision Making Amount and/or Complexity of Data Reviewed Labs: ordered. Radiology: ordered.  Risk Prescription drug management. Decision regarding hospitalization.   Micheal Ayers is a 63 y.o. male with a past medical history significant for obstructive hydrocephalus status post VP shunt with Dr. Jordan Likes last year, previous stroke, and previous kidney disease who presents for speech difficulty and facial droop.  According to patient and family, he has had difficulty with his speech today and was  last normal at 9 PM last night going to bed.  He is also noted to have a subtle right facial droop that the wife noticed today.  Patient reports that he was not feeling well Friday and Saturday but there was nothing definitive or focal to explain symptoms.  He was not have any headache and denies any trauma.  Otherwise no fevers, chills, congestion, cough, nausea, vomiting, constipation, diarrhea, or urinary changes.  Denies other complaints.  Reports he is doing well since his previous hydrocephalus episode and previous stroke.  On exam, lungs clear.  Chest nontender.  Abdomen nontender.  Patient does have a subtle right lower facial droop that spares the forehead.  He also had some expressive aphasia and was not dysarthric for me.  Pupils are symmetric and reactive with normal extract movements.  Intact finger-nose-finger testing.  No neglect.  Visual fields intact.  Intact sensation and strength in extremities.  Due to his speech difficulty and unilateral weakness in the face, will activate a code stroke for possible LVO.  Initial CT scan was reviewed by me and is concerning for a large subdural.  I called radiology who confirmed a 5 cm subdural with nearly 2 cm of midline shift.  Dr. Selina Cooley with neurology agreed with neurosurgery consultation.  I spoke with Dr. Yetta Barre with neurosurgery who wanted to come see the patient here at [facility] himself and he will post him for the OR.  Patient had a bed request placed for the neurosurgery ICU service however he will go directly to the preop area at [facility] for procedure and surgery.  We also spoke with Dr. Rosalia Hammers in the emergency department who accepted if the patient needs to go ED to ED transfer as well.  She will remain n.p.o. and last time he ate was 6 PM last night.  Patient was seen by Dr. Yetta Barre in this emergency department and she will go directly to preop area for suspected craniotomy.        Final Clinical Impression(s) / ED  Diagnoses Final diagnoses:  Subdural hematoma (HCC)  Facial droop  Aphasia   Clinical Impression: 1. Subdural hematoma (HCC)   2. Facial droop   3. Aphasia     Disposition: Admit  This note was prepared with assistance of Dragon voice recognition software. Occasional wrong-word or sound-a-like substitutions may have occurred due to the inherent limitations of voice recognition software.        Anshu Wehner, Canary Brim, MD 02/24/24 [phone]

## 2024-02-24 NOTE — ED Triage Notes (Signed)
 Hx obtained by pt. With assist from his wife. She reports that pt. "Hasn't felt well since Thursday, and he hasn't been eating much". She further tells me that upon inspection after arising this morning she noted pt. To have a right-sided mouth droop and that he was not as articulate as usual "having trouble forming his thoughts.". Pt. Has clear speech and answers all questions appropriately.

## 2024-02-24 NOTE — Progress Notes (Signed)
 SBP goal <140 going by cuff pressures per labetalol order.

## 2024-02-24 NOTE — Consult Note (Signed)
 NAME:  Micheal Ayers, MRN:  [id number], DOB:  [birth year], LOS: 0 ADMISSION DATE:  02/24/2024, CONSULTATION DATE: 02/24/2024 REFERRING MD: Dr. Yetta Barre, CHIEF COMPLAINT: Subdural hematoma  History of Present Illness:  63 year old former smoker with history of hypertension, CVA, obstructive hydrocephalus with a VP shunt from 2024 (Dr. Jordan Likes), depression, ADD, hyperlipidemia.  He was reportedly well on 3/29, developed difficulty with his speech the morning of 3/30.  Wife also noticed some right facial droop.  No headache, nausea, vomiting or other symptoms.  Evaluated in the ED where CT head showed large left subdural hematoma.  Evaluated by Dr. Yetta Barre with neurosurgery and underwent left frontotemporoparietal craniotomy for evacuation of the large left subdural hematoma. He transfers now to the ICU for further care.  Pertinent  Medical History   Past Medical History:  Diagnosis Date   ADD (attention deficit disorder)    Allergic rhinitis, cause unspecified    Chronic kidney disease    Acute renal failure   Hypertension    Stroke (HCC)     Significant Hospital Events: Including procedures, antibiotic start and stop dates in addition to other pertinent events   Head CT 3/30 > large left subdural hematoma, primary low density with some signs of septation.  Left frontal convexity approximately 5 cm with 1.5 to 2.0 cm midline shift.  VP shunt was intact with some decreased ventricular volume likely due to the mass effect.  Interim History / Subjective:    Objective   Blood pressure 117/86, pulse 70, temperature 98.2 F (36.8 C), resp. rate (!) 21, height 6' 0.01" (1.829 m), weight 107.1 kg, SpO2 96%.        Intake/Output Summary (Last 24 hours) at 02/24/2024 1526 Last data filed at 02/24/2024 1500 Gross per 24 hour  Intake 932.33 ml  Output 500 ml  Net 432.33 ml   Filed Weights   02/24/24 0943 02/24/24 1131  Weight: 107.1 kg 107.1 kg    Examination: General: Obese gentleman laying  in bed, no distress HENT: Surgical drain in place, ventricular drain in place.  Dressing intact.  Oropharynx clear.  Strong voice, no stridor, no secretions Lungs: Clear bilaterally Cardiovascular: Distant, regular, no murmur Abdomen: Protuberant, nondistended, nontender, positive bowel sounds Extremities: No edema Neuro: Awake, interacting appropriately but with some subtle word finding difficulty.  No apparent facial droop.  Tongue midline.  Moves all extremities symmetrically GU: Foley catheter in place  Resolved Hospital Problem list     Assessment & Plan:   Subacute/chronic left subdural hematoma, POD #0 status post left frontotemporoparietal craniotomy for evacuation. Encephalopathy/agitation postoperatively -ICU monitoring with frequent neurochecks -Seizure precautions -Repeat head CT 6 AM on 3/31 -Keppra for seizure prophylaxis -Dexamethasone taper in place -Blood pressure goal >.  Labetalol available as needed -Pain control, morphine ordered, also enteral Vicodin -Try low-dose Ativan for his postop agitation.  Could consider scheduling or transition to Precedex depending on effectiveness.  Careful to avoid oversedation -Surgical site and drain management as per neurosurgery -Hold home aspirin for now, restart when okay with neurosurgery  Hypertension -Valsartan/HCTZ  Hyperlipidemia -Rosuvastatin  GERD -Protonix  ADD -Strattera  Depression -Wellbutrin    Best Practice (right click and "Reselect all SmartList Selections" daily)   Diet/type: Regular consistency (see orders) DVT prophylaxis SCD Pressure ulcer(s): N/A GI prophylaxis: PPI Lines: Arterial Line Foley:  Yes, and it is no longer needed and removal ordered  Code Status:  full code Last date of multidisciplinary goals of care discussion [pending]  Labs  CBC: Recent Labs  Lab 02/24/24 0947  WBC 8.8  NEUTROABS 6.2  HGB 16.4  HCT 47.9  MCV 88.9  PLT 256    Basic Metabolic Panel: Recent  Labs  Lab 02/24/24 0947  NA 137  K 4.1  CL 103  CO2 24  GLUCOSE 114*  BUN 17  CREATININE 1.10  CALCIUM 9.3   GFR: Estimated Creatinine Clearance: 89.2 mL/min (by C-G formula based on SCr of 1.1 mg/dL). Recent Labs  Lab 02/24/24 0947  WBC 8.8    Liver Function Tests: Recent Labs  Lab 02/24/24 0947  AST 10*  ALT 11  ALKPHOS 71  BILITOT 0.6  PROT 7.6  ALBUMIN 4.3   No results for input(s): "LIPASE", "AMYLASE" in the last 168 hours. No results for input(s): "AMMONIA" in the last 168 hours.  ABG No results found for: "PHART", "PCO2ART", "PO2ART", "HCO3", "TCO2", "ACIDBASEDEF", "O2SAT"   Coagulation Profile: Recent Labs  Lab 02/24/24 0947  INR 1.1    Cardiac Enzymes: No results for input(s): "CKTOTAL", "CKMB", "CKMBINDEX", "TROPONINI" in the last 168 hours.  HbA1C: Hgb A1c MFr Bld  Date/Time Value Ref Range Status  02/19/2023 06:05 AM 6.0 (H) 4.8 - 5.6 % Final    Comment:    (NOTE)         Prediabetes: 5.7 - 6.4         Diabetes: >6.4         Glycemic control for adults with diabetes: <7.0     CBG: Recent Labs  Lab 02/24/24 0935 02/24/24 1506  GLUCAP 115* 112*    Review of Systems:   As per HPI  Past Medical History:  He,  has a past medical history of ADD (attention deficit disorder), Allergic rhinitis, cause unspecified, Chronic kidney disease, Hypertension, and Stroke (HCC).   Surgical History:   Past Surgical History:  Procedure Laterality Date   elbow surgury  1969   left elbow   INGUINAL HERNIA REPAIR  2003   left   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/2023   Procedure: Shunt Placment - right occipital;  Surgeon: Julio Sicks, MD;  Location: [facility] OR;  Service: Neurosurgery;  Laterality: Right;     Social History:   reports that he has quit smoking. He has never used smokeless tobacco. He reports current alcohol use. He reports that he does not use drugs.   Family History:  His family history includes Alcohol abuse in some other  family members; Arthritis in an other family member; Cancer in some other family members; Hypertension in some other family members; Kidney disease in an other family member; Stroke in an other family member.   Allergies Allergies  Allergen Reactions   Erythromycin Hives   Erythromycin Base      Home Medications  Prior to Admission medications   Medication Sig Start Date End Date Taking? Authorizing Provider  aspirin EC 81 MG tablet Take 1 tablet (81 mg total) by mouth daily. Swallow whole. 02/22/23   Hollice Espy, MD  atomoxetine (STRATTERA) 80 MG capsule Take 1 capsule (80 mg total) by mouth daily. 10/03/23   Mozingo, Thereasa Solo, NP  buPROPion (WELLBUTRIN XL) 300 MG 24 hr tablet Take 1 tablet (300 mg total) by mouth daily. 11/29/23   Mozingo, Thereasa Solo, NP  Cholecalciferol (VITAMIN D3) 50 MCG (2000 UT) CAPS Take 2,000 Units by mouth daily.    [provider]  cyanocobalamin (VITAMIN B12) 1000 MCG tablet Take 1,000 mcg by mouth daily.    [provider]  omeprazole (PRILOSEC OTC) 20 MG tablet Take 20 mg by mouth daily.    [provider]  rosuvastatin (CRESTOR) 10 MG tablet Take 1 tablet (10 mg total) by mouth daily. 02/22/23   Hollice Espy, MD  valsartan-hydrochlorothiazide (DIOVAN-HCT) 80-12.5 MG tablet Take 1 tablet by mouth daily. 02/14/23   [provider]     Critical care time: NA     Levy Pupa, MD, PhD 02/24/2024, 4:02 PM [facility] [phone] or if no answer before 7:00PM call [phone] For any issues after 7:00PM please call eLink [phone]

## 2024-02-25 ENCOUNTER — Encounter (HOSPITAL_COMMUNITY): Payer: Self-pay | Admitting: Neurological Surgery

## 2024-02-25 ENCOUNTER — Inpatient Hospital Stay (HOSPITAL_COMMUNITY): Payer: Self-pay

## 2024-02-25 NOTE — Progress Notes (Signed)
 Patient readmitted yesterday with large left subacute/chronic subdural hematoma complicated by prior VP shunting.  Patient underwent craniotomy with evacuation of subdural hematoma.  Looks much better today.  Currently awake and alert.  Oriented and appropriate.  Motor and sensory function intact.  Wound clean and dry.  Drain output moderate from both his subdural and subgaleal drains.  Follow-up head CT scan demonstrates resolution of the majority of his large left-sided hematoma.  Patient with significant left-sided pneumocephalus.  Midline shift much improved.  Progressing well following craniotomy.  Begin to mobilize.  Follow-up head CT scan tomorrow.  Hopefully we can get drains out tomorrow.

## 2024-02-25 NOTE — Progress Notes (Signed)
 Patient ID: Micheal Ayers, male   DOB: [birth year], 62 y.o.   MRN: [id number] He looks good this morning.  He looks much better.  He is awake and alert and pleasant and interactive.  No aphasia.  CT scan looks good with expected pneumocephalus.  Mobilize as tolerated.  Dr. Jordan Likes will assume care from here.

## 2024-02-25 NOTE — Progress Notes (Signed)
 NAME:  Micheal Ayers, MRN:  [id number], DOB:  08-18-1961, LOS: 1 ADMISSION DATE:  02/24/2024, CONSULTATION DATE: 02/24/2024 REFERRING MD: Dr. Yetta Barre, CHIEF COMPLAINT: Subdural hematoma  History of Present Illness:  63 year old former smoker with history of hypertension, CVA, obstructive hydrocephalus with a VP shunt from 2024 (Dr. Jordan Likes), depression, ADD, hyperlipidemia.  He was reportedly well on 3/29, developed difficulty with his speech the morning of 3/30.  Wife also noticed some right facial droop.  No headache, nausea, vomiting or other symptoms.  Evaluated in the ED where CT head showed large left subdural hematoma.  Evaluated by Dr. Yetta Barre with neurosurgery and underwent left frontotemporoparietal craniotomy for evacuation of the large left subdural hematoma. He transfers now to the ICU for further care.  Pertinent  Medical History   Past Medical History:  Diagnosis Date   ADD (attention deficit disorder)    Allergic rhinitis, cause unspecified    Chronic kidney disease    Acute renal failure   Hypertension    Stroke (HCC)     Significant Hospital Events: Including procedures, antibiotic start and stop dates in addition to other pertinent events   Head CT 3/30 > large left subdural hematoma, primary low density with some signs of septation.  Left frontal convexity approximately 5 cm with 1.5 to 2.0 cm midline shift.  VP shunt was intact with some decreased ventricular volume likely due to the mass effect. Underwent craniotomy for evacuation.  Interim History / Subjective:  Today he feels a little off but denies pain. Afebrile overnight.   Objective   Blood pressure 135/88, pulse 88, temperature (!) 97.5 F (36.4 C), temperature source Oral, resp. rate 17, height 6' 0.01" (1.829 m), weight 107.1 kg, SpO2 94%.    FiO2 (%):  [28 %] 28 %   Intake/Output Summary (Last 24 hours) at 02/25/2024 1124 Last data filed at 02/25/2024 1000 Gross per 24 hour  Intake 2832.61 ml  Output  2421 ml  Net 411.61 ml   Filed Weights   02/24/24 0943 02/24/24 1131  Weight: 107.1 kg 107.1 kg    Examination: General: elderly man sitting up in the chair in NAD HENT: 2 surgical drains with blood-tinged CSF Lungs: breathing comfortably on [place], CTAB Cardiovascular: S1S2, RRR Abdomen: soft, NT Extremities: no cyanosis or edema Neuro: awake, alert, normal speech and answering questions appropriately. Per wife, does not seem to fully comprehend what has been going on despite multiple explanations.   BUN 17 Cr 1.1 WBC 8.8 H/H 16.4/47.9 Platelets 256 CT head 3/31> evacuation of SDH, some pneumocephalus but improved mass effect. VP shunt remains in place. Small SDH on R still present.  Resolved Hospital Problem list     Assessment & Plan:   Subacute/chronic left subdural hematoma, POD #0 status post left frontotemporoparietal craniotomy for evacuation. Encephalopathy/agitation postoperatively -ICU monitoring, CSF drains in place. Follow up head CT tomorrow per NS.  -seizure precautions, keppra for seizure prophylaxis -hold chemical DVT prophylaxis -PT,  mobilize. Advancing diet -steroid taper -prophylactic antibiotics for CSF drain  Hypertension -irbesartan & hydrochlorothiazide] -labetalol PRN for HTN  Hyperlipidemia -hold statin with recent brain bleed  GERD -con't PPI; PTA med  ADD -con't PTA Strattera  Depression -Wellbutrin; if seizures this needs to be stopped  Patient updated with wife at bedside.   Best Practice (right click and "Reselect all SmartList Selections" daily)   Diet/type: Regular consistency (see orders) DVT prophylaxis SCD Pressure ulcer(s): N/A GI prophylaxis: PPI Lines: N/A Foley:  N/A Code Status:  full code Last date of multidisciplinary goals of care discussion [pending]  Labs   CBC: Recent Labs  Lab 02/24/24 0947  WBC 8.8  NEUTROABS 6.2  HGB 16.4  HCT 47.9  MCV 88.9  PLT 256    Basic Metabolic Panel: Recent Labs   Lab 02/24/24 0947  NA 137  K 4.1  CL 103  CO2 24  GLUCOSE 114*  BUN 17  CREATININE 1.10  CALCIUM 9.3   GFR: Estimated Creatinine Clearance: 89.2 mL/min (by C-G formula based on SCr of 1.1 mg/dL). Recent Labs  Lab 02/24/24 0947  WBC 8.8    Critical care time:     Steffanie Dunn, DO 02/25/24 11:58 AM  Pulmonary & Critical Care  For contact information, see Amion. If no response to pager, please call PCCM consult pager. After hours, 7PM- 7AM, please call Elink.

## 2024-02-25 NOTE — Evaluation (Signed)
 Physical Therapy Evaluation Patient Details Name: Micheal Ayers MRN: [id number] DOB: 20-May-1961 Today's Date: 02/25/2024  History of Present Illness  Pt is a 63 y.o. M who presents 02/24/2024 with speech difficulty and right facial droop. CT head showed large left subdural hematoma. S/p left frontotemporparietal craniotomy 3/30. Significant PMH: hypertension, CVA, obstructive hydrocephalus with a VP shunt from 2024 (Dr. Jordan Likes), depression, ADD, hyperlipidemia.  Clinical Impression  Pt admitted with above. 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 Pt reports good pain control and is overall mobilizing well. Pt ambulating 120 ft with no assistive device and CGA for stability. Demonstrates mild dynamic balance deficits and right inattention. Discussed turning head to scan environment for obstacle negotiation. Recommend OPPT (neuro based setting) at d/c to address deficits and maximize functional independence.        If plan is discharge home, recommend the following: A little help with walking and/or transfers;A little help with bathing/dressing/bathroom;Assistance with cooking/housework;Assist for transportation;Help with stairs or ramp for entrance   Can travel by private vehicle        Equipment Recommendations None recommended by PT  Recommendations for Other Services       Functional Status Assessment Patient has had a recent decline in their functional status and demonstrates the ability to make significant improvements in function in a reasonable and predictable amount of time.     Precautions / Restrictions Precautions Precautions: Fall;Other (comment) Precaution/Restrictions Comments: Hemovac, ventricular drainage catheter Restrictions Weight Bearing Restrictions Per Provider Order: No      Mobility  Bed Mobility Overal bed mobility: Needs Assistance Bed Mobility: Supine to Sit     Supine to sit: Supervision           Transfers Overall transfer level: Needs assistance Equipment used: None Transfers: Sit to/from Stand Sit to Stand: Contact guard assist                Ambulation/Gait Ambulation/Gait assistance: Contact guard assist Gait Distance (Feet): 120 Feet Assistive device: IV Pole Gait Pattern/deviations: Step-through pattern, Decreased stride length, Drifts right/left Gait velocity: decreased     General Gait Details: Mild right drift, bumping into one obstacle on R, CGA for safety  Stairs            Wheelchair Mobility     Tilt Bed    Modified Rankin (Stroke Patients Only) Modified Rankin (Stroke Patients Only) Pre-Morbid Rankin Score: No symptoms Modified Rankin: Moderately severe disability     Balance Overall balance assessment: Mild deficits observed, not formally tested                                           Pertinent Vitals/Pain Pain Assessment Pain Assessment: No/denies pain    Home Living Family/patient expects to be discharged to:: Private residence Living Arrangements: Spouse/significant other;Children (daughter moving out next month) Available Help at Discharge: Family Type of Home: House Home Access: Stairs to enter Entrance Stairs-Rails: Left Entrance Stairs-Number of Steps: 2   Home Layout: Able to live on main level with bedroom/bathroom (his bedroom on 2nd floor) Home Equipment: None      Prior Function Prior Level of Function : Independent/Modified Independent             Mobility Comments: 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; on his feet half of the day       Extremity/Trunk  Assessment   Upper Extremity Assessment Upper Extremity Assessment: Overall WFL for tasks assessed    Lower Extremity Assessment Lower Extremity Assessment: Overall WFL for tasks assessed    Cervical / Trunk Assessment Cervical / Trunk Assessment: Normal  Communication   Communication Communication: No apparent difficulties     Cognition Arousal: Alert Behavior During Therapy: WFL for tasks assessed/performed   PT - Cognitive impairments: No apparent impairments                       PT - Cognition Comments: Pt A&O with good recall of events leading up to hospitalization and command following throughout session. Recalled working with therapist from last year. Did not assess higher level Following commands: Intact       Cueing       General Comments      Exercises     Assessment/Plan    PT Assessment Patient needs continued PT services  PT Problem List Decreased activity tolerance;Decreased balance;Decreased mobility       PT Treatment Interventions Gait training;Stair training;Functional mobility training;Therapeutic exercise;Balance training;Therapeutic activities;Patient/family education    PT Goals (Current goals can be found in the Care Plan section)  Acute Rehab PT Goals Patient Stated Goal: to improve PT Goal Formulation: With patient Time For Goal Achievement: 03/10/24 Potential to Achieve Goals: Good    Frequency Min 2X/week     Co-evaluation               AM-PAC PT "6 Clicks" Mobility  Outcome Measure Help needed turning from your back to your side while in a flat bed without using bedrails?: None Help needed moving from lying on your back to sitting on the side of a flat bed without using bedrails?: A Little Help needed moving to and from a bed to a chair (including a wheelchair)?: A Little Help needed standing up from a chair using your arms (e.g., wheelchair or bedside chair)?: A Little Help needed to walk in hospital room?: A Little Help needed climbing 3-5 steps with a railing? : A Little 6 Click Score: 19    End of Session Equipment Utilized During Treatment: Gait belt Activity Tolerance: Patient tolerated treatment well Patient left: in chair;with call bell/phone within reach;with chair alarm set Nurse Communication: Mobility status PT Visit Diagnosis:  Unsteadiness on feet (R26.81)    Time: 6578-4696 PT Time Calculation (min) (ACUTE ONLY): 27 min   Charges:   PT Evaluation $PT Eval Moderate Complexity: 1 Mod PT Treatments $Therapeutic Activity: 8-22 mins PT General Charges $$ ACUTE PT VISIT: 1 Visit         Lillia Pauls, PT, DPT Acute Rehabilitation Services Office [phone]   [name] 02/25/2024, 1:11 PM

## 2024-02-26 ENCOUNTER — Inpatient Hospital Stay (HOSPITAL_COMMUNITY): Payer: Self-pay

## 2024-02-26 DIAGNOSIS — G935 Compression of brain: Secondary | ICD-10-CM | POA: Diagnosis present

## 2024-02-26 DIAGNOSIS — K219 Gastro-esophageal reflux disease without esophagitis: Secondary | ICD-10-CM | POA: Diagnosis present

## 2024-02-26 DIAGNOSIS — R5381 Other malaise: Secondary | ICD-10-CM | POA: Diagnosis not present

## 2024-02-26 DIAGNOSIS — F411 Generalized anxiety disorder: Secondary | ICD-10-CM | POA: Diagnosis not present

## 2024-02-26 DIAGNOSIS — F32A Depression, unspecified: Secondary | ICD-10-CM | POA: Diagnosis present

## 2024-02-26 DIAGNOSIS — G4700 Insomnia, unspecified: Secondary | ICD-10-CM | POA: Diagnosis present

## 2024-02-26 DIAGNOSIS — Z982 Presence of cerebrospinal fluid drainage device: Secondary | ICD-10-CM | POA: Diagnosis not present

## 2024-02-26 DIAGNOSIS — R4781 Slurred speech: Secondary | ICD-10-CM | POA: Diagnosis present

## 2024-02-26 DIAGNOSIS — Z7982 Long term (current) use of aspirin: Secondary | ICD-10-CM | POA: Diagnosis not present

## 2024-02-26 DIAGNOSIS — Z8249 Family history of ischemic heart disease and other diseases of the circulatory system: Secondary | ICD-10-CM | POA: Diagnosis not present

## 2024-02-26 DIAGNOSIS — K5900 Constipation, unspecified: Secondary | ICD-10-CM | POA: Diagnosis not present

## 2024-02-26 DIAGNOSIS — R4701 Aphasia: Secondary | ICD-10-CM | POA: Diagnosis present

## 2024-02-26 DIAGNOSIS — N400 Enlarged prostate without lower urinary tract symptoms: Secondary | ICD-10-CM | POA: Diagnosis present

## 2024-02-26 DIAGNOSIS — Z87891 Personal history of nicotine dependence: Secondary | ICD-10-CM | POA: Diagnosis not present

## 2024-02-26 DIAGNOSIS — G479 Sleep disorder, unspecified: Secondary | ICD-10-CM | POA: Diagnosis not present

## 2024-02-26 DIAGNOSIS — K5901 Slow transit constipation: Secondary | ICD-10-CM | POA: Diagnosis not present

## 2024-02-26 DIAGNOSIS — F909 Attention-deficit hyperactivity disorder, unspecified type: Secondary | ICD-10-CM | POA: Diagnosis present

## 2024-02-26 DIAGNOSIS — G8191 Hemiplegia, unspecified affecting right dominant side: Secondary | ICD-10-CM | POA: Diagnosis present

## 2024-02-26 DIAGNOSIS — R5382 Chronic fatigue, unspecified: Secondary | ICD-10-CM | POA: Diagnosis present

## 2024-02-26 DIAGNOSIS — Z823 Family history of stroke: Secondary | ICD-10-CM | POA: Diagnosis not present

## 2024-02-26 DIAGNOSIS — G9349 Other encephalopathy: Secondary | ICD-10-CM | POA: Diagnosis present

## 2024-02-26 DIAGNOSIS — R29703 NIHSS score 3: Secondary | ICD-10-CM | POA: Diagnosis present

## 2024-02-26 DIAGNOSIS — R4586 Emotional lability: Secondary | ICD-10-CM | POA: Diagnosis not present

## 2024-02-26 DIAGNOSIS — S065XAA Traumatic subdural hemorrhage with loss of consciousness status unknown, initial encounter: Secondary | ICD-10-CM | POA: Diagnosis not present

## 2024-02-26 DIAGNOSIS — I6200 Nontraumatic subdural hemorrhage, unspecified: Secondary | ICD-10-CM | POA: Diagnosis present

## 2024-02-26 DIAGNOSIS — E785 Hyperlipidemia, unspecified: Secondary | ICD-10-CM | POA: Diagnosis present

## 2024-02-26 DIAGNOSIS — I69298 Other sequelae of other nontraumatic intracranial hemorrhage: Secondary | ICD-10-CM | POA: Diagnosis not present

## 2024-02-26 DIAGNOSIS — R29810 Facial weakness: Secondary | ICD-10-CM | POA: Diagnosis present

## 2024-02-26 DIAGNOSIS — I10 Essential (primary) hypertension: Secondary | ICD-10-CM | POA: Diagnosis present

## 2024-02-26 DIAGNOSIS — Z881 Allergy status to other antibiotic agents status: Secondary | ICD-10-CM | POA: Diagnosis not present

## 2024-02-26 DIAGNOSIS — G936 Cerebral edema: Secondary | ICD-10-CM | POA: Diagnosis present

## 2024-02-26 MED ORDER — POLYETHYLENE GLYCOL 3350 17 G PO PACK
17.0000 g | PACK | Freq: Every day | ORAL | Status: DC
  Administered 2024-02-26 – 2024-03-03 (×6): 17 g via ORAL
  Filled 2024-02-26 (×6): qty 1

## 2024-02-26 NOTE — Progress Notes (Signed)
 NAME:  Micheal Ayers, MRN:  [id number], DOB:  23-Jun-1961, LOS: 2 ADMISSION DATE:  02/24/2024, CONSULTATION DATE: 02/24/2024 REFERRING MD: Dr. Yetta Barre, CHIEF COMPLAINT: Subdural hematoma  History of Present Illness:  63 year old former smoker with history of hypertension, CVA, obstructive hydrocephalus with a VP shunt from 2024 (Dr. Jordan Likes), depression, ADD, hyperlipidemia.  He was reportedly well on 3/29, developed difficulty with his speech the morning of 3/30.  Wife also noticed some right facial droop.  No headache, nausea, vomiting or other symptoms.  Evaluated in the ED where CT head showed large left subdural hematoma.  Evaluated by Dr. Yetta Barre with neurosurgery and underwent left frontotemporoparietal craniotomy for evacuation of the large left subdural hematoma. He transfers now to the ICU for further care.  Pertinent  Medical History   Past Medical History:  Diagnosis Date   ADD (attention deficit disorder)    Allergic rhinitis, cause unspecified    Chronic kidney disease    Acute renal failure   Hypertension    Stroke (HCC)     Significant Hospital Events: Including procedures, antibiotic start and stop dates in addition to other pertinent events   Head CT 3/30 > large left subdural hematoma, primary low density with some signs of septation.  Left frontal convexity approximately 5 cm with 1.5 to 2.0 cm midline shift.  VP shunt was intact with some decreased ventricular volume likely due to the mass effect. Underwent craniotomy for evacuation.  Interim History / Subjective:  Self-discontinued surgical drain this morning. No BM since surgery. Per wife and RN, more alert and lucid today.  Objective   Blood pressure 117/78, pulse 79, temperature 97.6 F (36.4 C), temperature source Oral, resp. rate 14, height 6' 0.01" (1.829 m), weight 107.1 kg, SpO2 97%.        Intake/Output Summary (Last 24 hours) at 02/26/2024 0939 Last data filed at 02/26/2024 0800 Gross per 24 hour  Intake  1049.54 ml  Output 2735 ml  Net -1685.46 ml   Filed Weights   02/24/24 0943 02/24/24 1131  Weight: 107.1 kg 107.1 kg    Examination: General: Elderly man sitting up in bed in NAD HENT: One surgical drain, leaking some CSF around other drain.  Lungs: breathing Comfortably on [place], decreased basilar breath sounds Cardiovascular: S1S2, RRR Abdomen: Soft, NT Extremities: No cyanosis or edema Neuro: Awake, alert, normal speech, answering questions appropriately.  Head CT 4/1: ongoing pneumocephalus, small residual left SDH with drain. VP shunt.  Resolved Hospital Problem list     Assessment & Plan:   Subacute/chronic left subdural hematoma, POD #0 status post left frontotemporoparietal craniotomy for evacuation. Encephalopathy/agitation postoperatively -d/c CSF drains and transferring out of ICU -seizure precautions, keppra for seizure prophylaxis -con't holding chemical DVT prophylaxis -PT, OT -steroid taper -can d/c prophylactic antibiotics  Hypertension -con't hydrochlorothiazide & irbesartan -labetalol PRN for HTN -goal SBP <140  Hyperlipidemia -hold statin due to ICH  GERD -con't PPI; PTA  ADD -con't PTA strattera  Depression -Wellbutrin; if he develops seizures this needs to be stopped  Constipation -add miralax daily, con't senna BID  Patient updated with wife at bedside today. Transferring out of ICU. PCCM will be available as needed. Please call with questions.  Best Practice (right click and "Reselect all SmartList Selections" daily)   Diet/type: Regular consistency (see orders) DVT prophylaxis SCD Pressure ulcer(s): N/A GI prophylaxis: PPI Lines: N/A Foley:  N/A Code Status:  full code Last date of multidisciplinary goals of care discussion [pending]  Labs  CBC: Recent Labs  Lab 02/24/24 0947  WBC 8.8  NEUTROABS 6.2  HGB 16.4  HCT 47.9  MCV 88.9  PLT 256    Basic Metabolic Panel: Recent Labs  Lab 02/24/24 0947  NA 137  K 4.1   CL 103  CO2 24  GLUCOSE 114*  BUN 17  CREATININE 1.10  CALCIUM 9.3   GFR: Estimated Creatinine Clearance: 89.2 mL/min (by C-G formula based on SCr of 1.1 mg/dL). Recent Labs  Lab 02/24/24 0947  WBC 8.8    Critical care time:     Steffanie Dunn, DO 02/26/24 12:42 PM [facility]  For contact information, see Amion. If no response to pager, please call PCCM consult pager. After hours, 7PM- 7AM, please call Elink.

## 2024-02-26 NOTE — Evaluation (Signed)
 Occupational Therapy Evaluation Patient Details Name: Micheal Ayers MRN: [id number] DOB: 1961-02-27 Today's Date: 02/26/2024   History of Present Illness   Pt is a 63 y.o. M who presents 02/24/2024 with speech difficulty and right facial droop. CT head showed large left subdural hematoma. S/p left frontotemporparietal craniotomy 3/30. Significant PMH: hypertension, CVA, obstructive hydrocephalus with a VP shunt from 2024 (Dr. Jordan Likes), depression, ADD, hyperlipidemia.     Clinical Impressions Patient admitted for the diagnosis above.  PTA he lives at home with his wife, has two grown children and continues to work full time.  Patient needed no assist for any aspect of ADL,iADL or mobility.  Patient presenting with impairments to coordination to both [place] R upper extremity, poor static and dynamic balance, possible inattention to R side of his body, and slowed responses to commands and questions.  Currently he is needing up to Mod A for self feeding and upper body ADL, Max A for lower body ADL, and up to Min A to Mod A for very simple sit tot stand, and to take a few steps at RW level.  This is a significant decline from his baseline.  Patient demonstrates the ability to participate in higher intensity rehab, and should be able to reach a Mod I level for ADL completion.  OT will continue efforts in the acute setting to address deficits, and Patient will benefit from intensive inpatient follow-up therapy, >3 hours/day.     If plan is discharge home, recommend the following:   Assistance with cooking/housework;Assist for transportation;A lot of help with bathing/dressing/bathroom;A lot of help with walking and/or transfers;Help with stairs or ramp for entrance     Functional Status Assessment   Patient has had a recent decline in their functional status and demonstrates the ability to make significant improvements in function in a reasonable and predictable amount of time.     Equipment  Recommendations   Tub/shower bench;BSC/3in1     Recommendations for Other Services         Precautions/Restrictions   Precautions Precautions: Fall Restrictions Weight Bearing Restrictions Per Provider Order: No     Mobility Bed Mobility               General bed mobility comments: OOB in chair    Transfers Overall transfer level: Needs assistance Equipment used: Rolling walker (2 wheels) Transfers: Sit to/from Stand Sit to Stand: Min assist                  Balance Overall balance assessment: Needs assistance Sitting-balance support: Feet supported Sitting balance-Leahy Scale: Good     Standing balance support: Bilateral upper extremity supported Standing balance-Leahy Scale: Poor Standing balance comment: unable to grasp RW with R hand.  Slow steps to the R leg                           ADL either performed or assessed with clinical judgement   ADL Overall ADL's : Needs assistance/impaired Eating/Feeding: Moderate assistance;Sitting   Grooming: Wash/dry hands;Wash/dry face;Moderate assistance;Sitting   Upper Body Bathing: Moderate assistance;Sitting   Lower Body Bathing: Maximal assistance;Sitting/lateral leans   Upper Body Dressing : Maximal assistance;Sitting   Lower Body Dressing: Maximal assistance;Sitting/lateral leans   Toilet Transfer: Moderate assistance;Stand-pivot;BSC/3in1                   Vision Patient Visual Report: No change from baseline       Perception Perception: Impaired  Preception Impairment Details: Inattention/Neglect Perception-Other Comments: Tendency to leave R hand behind him   Praxis Praxis: Impaired Praxis Impairment Details: Ideomotor, Motor planning, Limb apraxia     Pertinent Vitals/Pain Pain Assessment Pain Score: 4  Pain Location: headache Pain Descriptors / Indicators: Headache Pain Intervention(s): Monitored during session     Extremity/Trunk Assessment Upper Extremity  Assessment Upper Extremity Assessment: Right hand dominant;RUE deficits/detail;LUE deficits/detail RUE Deficits / Details: Delayed recruiment of muscles for MMT.  Weak grip, difficulty bringing hand to mouth.  3+/5 MMT RUE Sensation: WNL RUE Coordination: decreased fine motor;decreased gross motor LUE Deficits / Details: Having mild coordination deficits to the L UE as well, not as pronounced as the R.  Able to bring L hand to his mouth, but delayed as well. LUE Sensation: WNL LUE Coordination: decreased fine motor;decreased gross motor   Lower Extremity Assessment Lower Extremity Assessment: Defer to PT evaluation   Cervical / Trunk Assessment Cervical / Trunk Assessment: Normal   Communication Communication Communication: No apparent difficulties Factors Affecting Communication: Difficulty expressing self   Cognition Arousal: Alert Behavior During Therapy: WFL for tasks assessed/performed Cognition: No apparent impairments             OT - Cognition Comments: Slowed responses, following commands.                 Following commands: Intact       Cueing  General Comments   Cueing Techniques: Verbal cues      Exercises     Shoulder Instructions      Home Living Family/patient expects to be discharged to:: Private residence Living Arrangements: Spouse/significant other;Children Available Help at Discharge: Family Type of Home: House Home Access: Stairs to enter Secretary/administrator of Steps: 2 Entrance Stairs-Rails: Left Home Layout: Able to live on main level with bedroom/bathroom     Bathroom Shower/Tub: Chief Strategy Officer: Standard Bathroom Accessibility: Yes   Home Equipment: None          Prior Functioning/Environment Prior Level of Function : Independent/Modified Independent             Mobility Comments: 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; on his feet half of the day ADLs Comments: Ind with all ADL, iADL, continues to  work.    OT Problem List: Decreased strength;Impaired balance (sitting and/or standing);Decreased knowledge of use of DME or AE;Decreased coordination;Impaired UE functional use   OT Treatment/Interventions: Self-care/ADL training;Therapeutic exercise;Therapeutic activities;Visual/perceptual remediation/compensation;Patient/family education;DME and/or AE instruction;Balance training      OT Goals(Current goals can be found in the care plan section)   Acute Rehab OT Goals Patient Stated Goal: Return home and go back to work OT Goal Formulation: With patient Time For Goal Achievement: 03/11/24 Potential to Achieve Goals: Good ADL Goals Pt Will Perform Eating: with adaptive utensils;with min assist;sitting Pt Will Perform Grooming: with min assist;standing Pt Will Perform Upper Body Bathing: with min assist;sitting Pt Will Perform Upper Body Dressing: with min assist;sitting Pt Will Perform Lower Body Dressing: with mod assist;sit to/from stand Pt Will Transfer to Toilet: with min assist;ambulating;regular height toilet   OT Frequency:  Min 2X/week    Co-evaluation              AM-PAC OT "6 Clicks" Daily Activity     Outcome Measure Help from another person eating meals?: A Lot Help from another person taking care of personal grooming?: A Lot Help from another person toileting, which includes using toliet, bedpan, or urinal?: A Lot Help  from another person bathing (including washing, rinsing, drying)?: A Lot Help from another person to put on and taking off regular upper body clothing?: A Lot Help from another person to put on and taking off regular lower body clothing?: A Lot 6 Click Score: 12   End of Session Equipment Utilized During Treatment: Gait belt;Rolling walker (2 wheels) Nurse Communication: Mobility status  Activity Tolerance: Patient tolerated treatment well Patient left: in chair;with call bell/phone within reach;with chair alarm set;with nursing/sitter in  room  OT Visit Diagnosis: Unsteadiness on feet (R26.81);Other abnormalities of gait and mobility (R26.89);Muscle weakness (generalized) (M62.81);Feeding difficulties (R63.3);Apraxia (R48.2);Ataxia, unspecified (R27.0);Hemiplegia and hemiparesis Hemiplegia - Right/Left: Right Hemiplegia - dominant/non-dominant: Dominant Hemiplegia - caused by: Other Nontraumatic intracranial hemorrhage                Time: 2956-2130 OT Time Calculation (min): 24 min Charges:  OT General Charges $OT Visit: 1 Visit OT Evaluation $OT Eval Moderate Complexity: 1 Mod OT Treatments $Self Care/Home Management : 8-22 mins  02/26/2024  RP, OTR/L  Acute Rehabilitation Services  Office:  [phone]   Suzanna Obey 02/26/2024, 3:34 PM

## 2024-02-26 NOTE — Progress Notes (Signed)
Inpatient Rehab Admissions Coordinator:  ° °Per therapy recommendation,  patient was screened for CIR candidacy by Ltanya Bayley, MS, CCC-SLP. At this time, Pt. Appears to be a a potential candidate for CIR. I will place   order for rehab consult per protocol for full assessment. Please contact me any with questions. ° °Jacy Brocker, MS, CCC-SLP °Rehab Admissions Coordinator  °[phone] (celll) °[phone] (office) ° °

## 2024-02-26 NOTE — TOC CM/SW Note (Signed)
 Transition of Care [facility]) - Inpatient Brief Assessment   Patient Details  Name: DARAN FAVARO MRN: [id number] Date of Birth: December 04, 1960  Transition of Care [facility]) CM/SW Contact:    Mearl Latin, LCSW Phone Number: 02/26/2024, 9:09 AM   Clinical Narrative: Patient admitted from home with spouse undergoing workup for subdural hematoma. TOC following for outpatient PT referral. He is currently on 3L nasal cannula.    Transition of Care Asessment: Insurance and Status: Selfpay Patient has primary care physician: Yes Home environment has been reviewed: From home Prior level of function:: Independent Prior/Current Home Services: No current home services Social Drivers of Health Review: SDOH reviewed no interventions necessary Readmission risk has been reviewed: Yes Transition of care needs: transition of care needs identified, TOC will continue to follow

## 2024-02-26 NOTE — Progress Notes (Signed)
 Physical Therapy Treatment Patient Details Name: Micheal Ayers MRN: [id number] DOB: [birth year] Today's Date: 02/26/2024   History of Present Illness Pt is a 63 y.o. M who presents 02/24/2024 with speech difficulty and right facial droop. CT head showed large left subdural hematoma. S/p left frontotemporparietal craniotomy 3/30. Significant PMH: hypertension, CVA, obstructive hydrocephalus with a VP shunt from 2024 (Dr. Jordan Likes), depression, ADD, hyperlipidemia.    PT Comments  Pt with regression towards his physical therapy goals today. Pt received sitting up in chair; reports feeling "blah," and he did not sleep well last night. States he has an 8/10 headache (RN present to provide pain medication). Pt able to stand from chair with CGA, took a couple steps forward and had a gross lateral loss of balance towards right, requiring modA by therapist to correct. Worked on pre gait training, marching in place and forward and backwards steps. Attempted to utilize a walker for increased external support, however, pt unable to functionally grasp with R hand. Interestingly, pt has a strong R hand grip. RN notified of changes in comparison to yesterday and neuro team aware. Based on regression, updated d/c recommendation to intensive inpatient follow-up therapy, >3 hours/day. Will continue to assess.     If plan is discharge home, recommend the following: A little help with walking and/or transfers;A little help with bathing/dressing/bathroom;Assistance with cooking/housework;Assist for transportation;Help with stairs or ramp for entrance   Can travel by private vehicle        Equipment Recommendations  None recommended by PT    Recommendations for Other Services       Precautions / Restrictions Precautions Precautions: Fall Restrictions Weight Bearing Restrictions Per Provider Order: No     Mobility  Bed Mobility               General bed mobility comments: OOB in chair     Transfers Overall transfer level: Needs assistance Equipment used: None Transfers: Sit to/from Stand Sit to Stand: Contact guard assist           General transfer comment: CGA for safety    Ambulation/Gait Ambulation/Gait assistance: Mod assist Gait Distance (Feet): 3 Feet Assistive device: None, Rolling walker (2 wheels) Gait Pattern/deviations: Step-through pattern, Decreased stride length, Drifts right/left Gait velocity: decreased Gait velocity interpretation: <1.31 ft/sec, indicative of household ambulator   General Gait Details: Pt with gross lateral loss of balance to R, requiring modA to correct   Stairs             Wheelchair Mobility     Tilt Bed    Modified Rankin (Stroke Patients Only) Modified Rankin (Stroke Patients Only) Pre-Morbid Rankin Score: No symptoms Modified Rankin: Moderately severe disability     Balance Overall balance assessment: Needs assistance Sitting-balance support: Feet supported Sitting balance-Leahy Scale: Good     Standing balance support: No upper extremity supported, During functional activity Standing balance-Leahy Scale: Poor                              Communication Communication Communication: No apparent difficulties  Cognition Arousal: Alert Behavior During Therapy: WFL for tasks assessed/performed   PT - Cognitive impairments: Awareness, Safety/Judgement                       PT - Cognition Comments: Pt with loss of balance during session without righting reaction Following commands: Intact      Cueing  Exercises      General Comments        Pertinent Vitals/Pain Pain Assessment Pain Assessment: 0-10 Pain Score: 8  Pain Location: headache Pain Descriptors / Indicators: Headache Pain Intervention(s): Monitored during session, RN gave pain meds during session    Home Living                          Prior Function            PT Goals (current goals  can now be found in the care plan section) Acute Rehab PT Goals Patient Stated Goal: to improve PT Goal Formulation: With patient Time For Goal Achievement: 03/10/24 Potential to Achieve Goals: Good Progress towards PT goals: Not progressing toward goals - comment    Frequency    Min 2X/week      PT Plan      Co-evaluation              AM-PAC PT "6 Clicks" Mobility   Outcome Measure  Help needed turning from your back to your side while in a flat bed without using bedrails?: None Help needed moving from lying on your back to sitting on the side of a flat bed without using bedrails?: A Little Help needed moving to and from a bed to a chair (including a wheelchair)?: A Little Help needed standing up from a chair using your arms (e.g., wheelchair or bedside chair)?: A Little Help needed to walk in hospital room?: A Little Help needed climbing 3-5 steps with a railing? : A Little 6 Click Score: 19    End of Session Equipment Utilized During Treatment: Gait belt Activity Tolerance: Patient tolerated treatment well Patient left: in chair;with call bell/phone within reach;with chair alarm set Nurse Communication: Mobility status PT Visit Diagnosis: Unsteadiness on feet (R26.81)     Time: 4098-1191 PT Time Calculation (min) (ACUTE ONLY): 26 min  Charges:    $Therapeutic Activity: 23-37 mins PT General Charges $$ ACUTE PT VISIT: 1 Visit                     Lillia Pauls, PT, DPT Acute Rehabilitation Services Office [phone]    [name] 62/11/2023, 1:40 PM

## 2024-02-26 NOTE — Progress Notes (Signed)
 Overall stable.  Minimal headache.  A little bit restless overnight.  No confusion.  Afebrile.  Vital signs are stable.  Awake and alert.  Oriented and appropriate.  Cranial nerve function intact.  Motor 5/5 bilaterally.  Wounds clean and dry.  Drains removed today.  Follow-up head CT scan with decreased right pneumocephalus but still with a significant right frontal air collection.  Minimal residual subdural blood.  Overall progressing well.  Transfer from ICU.  Continue working with therapy.  Hopefully home in a few days.

## 2024-02-26 NOTE — Progress Notes (Signed)
 Pt admitted to the unit as a transfer from [facility] ICU via chair with spouse and belongings to the side. Pt alert and verbally responsive, follows command. MAE x4, VSS, telemetry applied and verified with CCMD, skin assessment completed with second RN per protocol, Skin intact with no pressure ulcer noted except for head surgical incision site unremarkable with staples intact. Pt sitting up in chair with chair alarm on, spouse sitting in room with pt. Pt's call light is within reach. Dionne Bucy RN   02/26/24 1248  Vitals  Temp 97.7 F (36.5 C)  Temp Source Oral  BP 120/87  MAP (mmHg) 97  BP Location Left Arm  BP Method Automatic  Patient Position (if appropriate) Sitting  Pulse Rate 85  Pulse Rate Source Monitor  ECG Heart Rate 86  Resp 16  MEWS COLOR  MEWS Score Color Green  Oxygen Therapy  SpO2 97 %  O2 Device Room Air  Pain Assessment  Pain Scale 0-10  Pain Score 2  Pain Intervention(s) Refused;Emotional support;Rest  MEWS Score  MEWS Temp 0  MEWS Systolic 0  MEWS Pulse 0  MEWS RR 0  MEWS LOC 0  MEWS Score 0

## 2024-02-27 NOTE — Progress Notes (Addendum)
 Physical Therapy Treatment Patient Details Name: Micheal Ayers MRN: [id number] DOB: 07-Aug-1961 Today's Date: 02/27/2024   History of Present Illness Pt is a 63 y.o. M who presents 02/24/2024 with speech difficulty and right facial droop. CT head showed large left subdural hematoma. S/p left frontotemporparietal craniotomy 3/30. Significant PMH: hypertension, CVA, obstructive hydrocephalus with a VP shunt from 2024 (Dr. Jordan Likes), depression, ADD, hyperlipidemia.    PT Comments  Pt with some progression towards his physical therapy goals in comparison to yesterday, but still a regression overall since 3/31 evaluation. Session focused on functional mobility, ADL's, balance and gait training. Pt requiring minimal assist for ambulating ~30 ft with no assistive device. Demonstrates right inattention, bumping into two obstacles on right despite verbal cueing. Presents as a high fall risk based on decreased safety awareness and decreased gait speed. Based on gross change from baseline of independence and working full time, recommend intensive inpatient follow-up therapy, >3 hours/day to address deficits and maximize functional independence.     If plan is discharge home, recommend the following: A little help with walking and/or transfers;A little help with bathing/dressing/bathroom;Assistance with cooking/housework;Assist for transportation;Help with stairs or ramp for entrance   Can travel by private vehicle        Equipment Recommendations  None recommended by PT    Recommendations for Other Services       Precautions / Restrictions Precautions Precautions: Fall Restrictions Weight Bearing Restrictions Per Provider Order: No     Mobility  Bed Mobility Overal bed mobility: Needs Assistance Bed Mobility: Supine to Sit     Supine to sit: Supervision          Transfers Overall transfer level: Needs assistance Equipment used: None Transfers: Sit to/from Stand Sit to Stand: Contact  guard assist                Ambulation/Gait Ambulation/Gait assistance: Min assist Gait Distance (Feet): 30 Feet Assistive device: None Gait Pattern/deviations: Step-through pattern, Decreased stride length, Drifts right/left Gait velocity: decreased Gait velocity interpretation: <1.8 ft/sec, indicate of risk for recurrent falls   General Gait Details: Pt requiring consistent minA throughout gait, drift towards R bumping into 2 objects despite verbal cueing, increased lateral sway, verbal cues for increased foot clearance. Chair follow utilized   Comptroller Bed    Modified Rankin (Stroke Patients Only) Modified Rankin (Stroke Patients Only) Pre-Morbid Rankin Score: No symptoms Modified Rankin: Moderately severe disability     Balance Overall balance assessment: Needs assistance Sitting-balance support: Feet supported Sitting balance-Leahy Scale: Fair Sitting balance - Comments: One episode of posterior LOB; pt able to correct with CGA and verbal cueing   Standing balance support: No upper extremity supported, During functional activity Standing balance-Leahy Scale: Poor Standing balance comment: reliant on external assist by therapist                            Communication Communication Communication: No apparent difficulties  Cognition Arousal: Alert Behavior During Therapy: WFL for tasks assessed/performed   PT - Cognitive impairments: Awareness, Safety/Judgement                         Following commands: Intact      Cueing Cueing Techniques: Verbal cues  Exercises General Exercises - Lower Extremity Long Arc Quad: Both, 10 reps, Seated Hip  Flexion/Marching: Both, 10 reps, Seated Other Exercises Other Exercises: Sitting: functional reaching with RUE in all planes    General Comments        Pertinent Vitals/Pain Pain Assessment Pain Assessment: 0-10 Pain Score: 9  Pain  Location: headache Pain Descriptors / Indicators: Headache Pain Intervention(s): Monitored during session, Patient requesting pain meds-RN notified    Home Living                          Prior Function            PT Goals (current goals can now be found in the care plan section) Acute Rehab PT Goals Patient Stated Goal: to improve Potential to Achieve Goals: Good Progress towards PT goals: Progressing toward goals    Frequency    Min 3X/week      PT Plan      Co-evaluation              AM-PAC PT "6 Clicks" Mobility   Outcome Measure  Help needed turning from your back to your side while in a flat bed without using bedrails?: None Help needed moving from lying on your back to sitting on the side of a flat bed without using bedrails?: A Little Help needed moving to and from a bed to a chair (including a wheelchair)?: A Little Help needed standing up from a chair using your arms (e.g., wheelchair or bedside chair)?: A Little Help needed to walk in hospital room?: A Little Help needed climbing 3-5 steps with a railing? : A Lot 6 Click Score: 18    End of Session Equipment Utilized During Treatment: Gait belt Activity Tolerance: Patient tolerated treatment well Patient left: in chair;with call bell/phone within reach;with chair alarm set Nurse Communication: Mobility status PT Visit Diagnosis: Unsteadiness on feet (R26.81)     Time: 1610-9604 PT Time Calculation (min) (ACUTE ONLY): 31 min  Charges:    $Gait Training: 8-22 mins $Therapeutic Activity: 8-22 mins PT General Charges $$ ACUTE PT VISIT: 1 Visit                     Lillia Pauls, PT, DPT Acute Rehabilitation Services Office [phone]    [name] 02/27/2024, 1:13 PM

## 2024-02-27 NOTE — PMR Pre-admission (Signed)
 PMR Admission Coordinator Pre-Admission Assessment  Patient: Micheal Ayers is an 63 y.o., male MRN: [id number] DOB: 1961-06-20 Height: 6' 0.01" (182.9 cm) Weight: 107.1 kg              Insurance Information HMO:     PPO:      PCP:      IPA:      80/20:      OTHER:  PRIMARY: [employer] Choice POS II      Policy#: [id number]      Subscriber: pt CM Name: Charisse March      Phone#: [phone]     Fax#: [phone] Pre-Cert#: [id number] auth for CIR from [place] with Monia Pouch after peer to peer with updates due to fax listed above on 03/10/24      Employer: [employer] Benefits:  Phone #:      Name:  Eff. Date: 02/26/24     Deduct: $650 ($0 met)      Out of Pocket Max: $3000 ($0 met)      Life Max: n/a  CIR: 80%      SNF: 80% Outpatient:      Co-Pay: $30/visit Home Health: 80%      Co-Pay: 20% DME: 80%     Co-Pay: 20% Providers:  SECONDARY:       Policy#:       Phone#:   Artist:       Phone#:   The Engineer, materials Information Summary" for patients in Inpatient Rehabilitation Facilities with attached "Privacy Act Statement-Health Care Records" was provided and verbally reviewed with: Patient and Family  Emergency Contact Information Contact Information     Name Relation Home Work Mobile   Pev[place] Spouse   [phone]      Other Contacts   None on File    Current Medical History  Patient Admitting Diagnosis: SDH   History of Present Illness: PT is a 63 y/o male with PMH of obstructive hydrocephalus s/p VP Shunt in 2024 per Dr. Jordan Likes, previous CVA, and kidney disease who presented to [facility] on 02/24/24 with c/o speech difficulty and R facial droop.  Imaging revealed large L SDH with 1.5-2 cm midline shift, for which neurosurgery was consulted and recommendations were for a L craniotomy for evacuation.  Pt to OR with Dr. Yetta Barre on 3/30.  Follow up CT revealed resolution of majority of SDH, improvement of MLS, and significant left sided pneumocephalus which improved on  serial CTs.  Therapy evaluations completed and pt was recommended for CIR.   Complete NIHSS TOTAL: 3 Glasgow Coma Scale Score: 15  Patient's medical record from [facility] has been reviewed by the rehabilitation admission coordinator and physician.  Past Medical History  Past Medical History:  Diagnosis Date   ADD (attention deficit disorder)    Allergic rhinitis, cause unspecified    Chronic kidney disease    Acute renal failure   Hypertension    Stroke [facility])     Has the patient had major surgery during 100 days prior to admission? Yes  Family History  family history includes Alcohol abuse in some other family members; Arthritis in an other family member; Cancer in some other family members; Hypertension in some other family members; Kidney disease in an other family member; Stroke in an other family member.   Current Medications   Current Facility-Administered Medications:    acetaminophen (TYLENOL) tablet 650 mg, 650 mg, Oral, Q4H PRN **OR** acetaminophen (TYLENOL) suppository 650 mg, 650 mg, Rectal, Q4H PRN, Marikay Alar  Lorin Picket, MD   atomoxetine (STRATTERA) capsule 80 mg, 80 mg, Oral, Daily, Arman Bogus, MD, 80 mg at 02/27/24 1610   buPROPion (WELLBUTRIN XL) 24 hr tablet 300 mg, 300 mg, Oral, Daily, Arman Bogus, MD, 300 mg at 02/27/24 9604   Chlorhexidine Gluconate Cloth 2 % PADS 6 each, 6 each, Topical, Daily, Arman Bogus, MD, 6 each at 02/26/24 1600   [COMPLETED] dexamethasone (DECADRON) injection 6 mg, 6 mg, Intravenous, Q6H, 6 mg at 02/25/24 1155 **FOLLOWED BY** [COMPLETED] dexamethasone (DECADRON) injection 4 mg, 4 mg, Intravenous, Q6H, 4 mg at 02/26/24 1142 **FOLLOWED BY** dexamethasone (DECADRON) injection 4 mg, 4 mg, Intravenous, Q8H, Arman Bogus, MD, 4 mg at 02/27/24 1348   irbesartan (AVAPRO) tablet 75 mg, 75 mg, Oral, Daily, 75 mg at 02/27/24 0917 **AND** hydrochlorothiazide (HYDRODIURIL) tablet 12.5 mg, 12.5 mg, Oral, Daily, Arman Bogus, MD, 12.5 mg at 02/27/24 5409   HYDROcodone-acetaminophen (NORCO/VICODIN) 5-325 MG per tablet 1 tablet, 1 tablet, Oral, Q4H PRN, Arman Bogus, MD, 1 tablet at 02/26/24 1210   labetalol (NORMODYNE) injection 10-40 mg, 10-40 mg, Intravenous, Q10 min PRN, Julio Sicks, MD   levETIRAcetam (KEPPRA) IVPB 500 mg/100 mL premix, 500 mg, Intravenous, Q12H, Arman Bogus, MD, Last Rate: 400 mL/hr at 02/27/24 0411, 500 mg at 02/27/24 0411   morphine (PF) 2 MG/ML injection 1-2 mg, 1-2 mg, Intravenous, Q2H PRN, Arman Bogus, MD   ondansetron [facility]) tablet 4 mg, 4 mg, Oral, Q4H PRN **OR** ondansetron (ZOFRAN) injection 4 mg, 4 mg, Intravenous, Q4H PRN, Arman Bogus, MD   Oral care mouth rinse, 15 mL, Mouth Rinse, PRN, Arman Bogus, MD   pantoprazole (PROTONIX) injection 40 mg, 40 mg, Intravenous, QHS, Arman Bogus, MD, 40 mg at 02/26/24 2119   polyethylene glycol (MIRALAX / GLYCOLAX) packet 17 g, 17 g, Oral, Daily, Steffanie Dunn, DO, 17 g at 02/27/24 8119   promethazine (PHENERGAN) tablet 12.5-25 mg, 12.5-25 mg, Oral, Q4H PRN, Arman Bogus, MD   senna [facility]) tablet 8.6 mg, 1 tablet, Oral, BID, Arman Bogus, MD, 8.6 mg at 02/27/24 1478  Patients Current Diet:  Diet Order             Diet regular Room service appropriate? 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  Self Care: Did the patient need help bathing, dressing, using the toilet or eating?  Independent  Indoor Mobility: Did the patient need assistance with walking from room to room (with or without device)? Independent  Stairs: Did the patient need assistance with internal or external stairs (with or without device)? Independent  Functional Cognition: Did the patient need help planning regular tasks such as shopping or remembering to take medications? Independent  Patient Information Are you of Hispanic, Latino/a,or Spanish origin?: A. No, not of Hispanic, Latino/a, or Spanish origin What  is your race?: A. White Do you need or want an interpreter to communicate with a doctor or health care staff?: 0. No  Patient's Response To:  Health Literacy and Transportation Is the patient able to respond to health literacy and transportation needs?: Yes Health Literacy - How often do you need to have someone help you when you read instructions, pamphlets, or other written material from your doctor or pharmacy?: Never In the past 12 months, has lack of transportation kept you from medical appointments or from getting medications?: No In the past 12 months, has lack of transportation kept you from meetings, work, or from getting things needed for daily living?: No  Home Assistive Devices / Equipment Home Equipment: None  Prior Device Use: Indicate devices/aids used by the patient prior to current illness, exacerbation or injury? None of the above  Current Functional Level Cognition  Orientation Level: Oriented X4    Extremity Assessment (includes Sensation/Coordination)  Upper Extremity Assessment: Right hand dominant, RUE deficits/detail, LUE deficits/detail RUE Deficits / Details: Delayed recruiment of muscles for MMT.  Weak grip, difficulty bringing hand to mouth.  3+/5 MMT RUE Sensation: WNL RUE Coordination: decreased fine motor,  decreased gross motor LUE Deficits / Details: Having mild coordination deficits to the L UE as well, not as pronounced as the R.  Able to bring L hand to his mouth, but delayed as well. LUE Sensation: WNL LUE Coordination: decreased fine motor, decreased gross motor  Lower Extremity Assessment: Defer to PT evaluation    ADLs  Overall ADL's : Needs assistance/impaired Eating/Feeding: Moderate assistance, Sitting Grooming: Wash/dry hands, Wash/dry face, Moderate assistance, Sitting Upper Body Bathing: Moderate assistance, Sitting Lower Body Bathing: Maximal assistance, Sitting/lateral leans Upper Body Dressing : Maximal assistance, Sitting Lower Body Dressing: Maximal assistance, Sitting/lateral leans Toilet Transfer: Moderate assistance, Stand-pivot, BSC/3in1    Mobility  Overal bed mobility: Needs Assistance Bed Mobility: Supine to Sit Supine to sit: Supervision General bed mobility comments: OOB in chair    Transfers  Overall transfer level: Needs assistance Equipment used: None Transfers: Sit to/from Stand Sit to Stand: Contact guard assist General transfer comment: CGA for safety    Ambulation / Gait / Stairs / Wheelchair Mobility  Ambulation/Gait Ambulation/Gait assistance: Editor, commissioning (Feet): 30 Feet Assistive device: None Gait Pattern/deviations: Step-through pattern, Decreased stride length, Drifts right/left General Gait Details: Pt requiring consistent minA throughout gait, drift towards R bumping into 2 objects despite verbal cueing, increased lateral sway, verbal cues for increased foot clearance. Chair follow utilized Gait velocity: decreased Gait velocity interpretation: <1.8 ft/sec, indicate of risk for recurrent falls    Posture / Balance Dynamic Sitting Balance Sitting balance - Comments: One episode of posterior LOB; pt able to correct with CGA and verbal cueing Balance Overall balance assessment: Needs assistance Sitting-balance support: Feet  supported Sitting balance-Leahy Scale: Fair Sitting balance - Comments: One episode of posterior LOB; pt able to correct with CGA and verbal cueing Standing balance support: No upper extremity supported, During functional activity Standing balance-Leahy Scale: Poor Standing balance comment: reliant on external assist by therapist    Special needs/care consideration Skin surgical incision to L scalp     Previous Home Environment (from acute therapy documentation) Living Arrangements: Spouse/significant other, Children Available Help at Discharge: Family Type of Home: House Home Layout: Able to live on main level with bedroom/bathroom Home Access: Stairs to enter Entrance Stairs-Rails: Left Entrance Stairs-Number of Steps: 2 Bathroom Shower/Tub: Engineer, manufacturing systems: Standard Bathroom Accessibility: Yes  Discharge Living Setting Plans for Discharge Living Setting: Patient's home, Lives with (comment) (spouse) Type of Home at Discharge: House Discharge Home Layout: Able to live on main level with bedroom/bathroom Discharge Home Access: Stairs to enter Entrance Stairs-Rails: Left Entrance Stairs-Number of Steps: 2 Discharge Bathroom Shower/Tub: Walk-in shower Discharge Bathroom Toilet: Standard Discharge Bathroom Accessibility: Yes How Accessible: Accessible via walker Does the patient have any problems obtaining your medications?: No  Social/Family/Support Systems Patient Roles: Spouse, Parent Anticipated Caregiver: spouse, Lynnea Ferrier, is primary Anticipated Industrial/product designer Information: [id number] Ability/Limitations of Caregiver: supervision only and intial only Caregiver Availability: 24/7 Discharge Plan Discussed with Primary Caregiver: Yes Is Caregiver In Agreement with Plan?: Yes Does Caregiver/Family have Issues with Lodging/Transportation while Pt is in Rehab?: No   Goals Patient/Family Goal for Rehab: PT/OT/SLP supervision to mod I Expected length of  stay: 7-10 days Additional Information: Discharge plan: return home with pt's spouse to provide intial 24/7 supervision Pt/Family Agrees to Admission and willing to participate: Yes Program Orientation Provided & Reviewed with Pt/Caregiver Including Roles  & Responsibilities: Yes   Decrease burden of Care through IP rehab admission: n/a   Possible need for SNF placement upon discharge: Not anticipated.  Plan for discharge home with spouse to provide initial 24/7 supervision.  Estimate intermittent mod I goals.     Patient Condition: This patient's medical and functional status has changed since the consult dated: 02/28/24 in which the Rehabilitation Physician determined and documented that the patient's condition is appropriate for intensive rehabilitative care in an inpatient rehabilitation facility. See "History of Present Illness" (above) for medical update. Functional changes are: pt min assist for mobility, max assist for ADLs. Patient's medical and functional status update has been discussed with the Rehabilitation physician and patient remains appropriate for inpatient rehabilitation. Will admit to inpatient rehab today.  Preadmission Screen Completed By:  Stephania Fragmin, PT, DPT 02/27/2024 2:45 PM ______________________________________________________________________   Discussed status with Dr. Riley Kill on 03/03/24 at 1:46 PM  and received approval for admission today.  Admission Coordinator:  Stephania Fragmin, PT, DPT time 1:46 PM Dorna Bloom 03/03/24

## 2024-02-27 NOTE — Progress Notes (Signed)
 Overall looks pretty good.  Mild headache.  No other complaints.  He is afebrile.  His vital signs are stable.  Urine output is good.  He is awake and alert.  He is oriented and appropriate.  His speech is fluent.  Judgment insight appear intact.  Cranial nerve function normal bilateral.  Motor examination 5/5 bilaterally.  No pronator drift.  Chest and abdomen benign.  Wound healing well.  Status post craniotomy for large left-sided subdural hematoma.  Continue efforts at mobilization and therapy.  Working toward discharge over the next few days.  Check follow-up head CT scan in morning.

## 2024-02-27 NOTE — Progress Notes (Addendum)
 Inpatient Rehab Admissions Coordinator:   Left message for spouse to discuss CIR recommendations.   1421: Spoke to pt's spouse, Lynnea Ferrier, on the phone to review CIR recommendations and goals/expectations of CIR stay.  She is able to provide initial 24/7 supervision for patient in the home at discharge from a short rehab stay.  She asks appropriate questions regarding rehab process and insurance authorization.  She notes that pt had cobra through 3/31 (which the acute hospital stay should be billed) and that as of 4/1 his new employer insurance started.  She is working to identify the payor and the policy number so we can proceed with prior auth.  I will help with this as best I can.    1437: I was able to verify an active policy with Monia Pouch, member ID [id number] effective date 02/26/24.  I will start insurance prior auth request pending rehab consult.    Estill Dooms, PT, DPT Admissions Coordinator [phone] 02/27/24  12:35 PM

## 2024-02-28 ENCOUNTER — Inpatient Hospital Stay (HOSPITAL_COMMUNITY)

## 2024-02-28 DIAGNOSIS — S065XAA Traumatic subdural hemorrhage with loss of consciousness status unknown, initial encounter: Secondary | ICD-10-CM | POA: Diagnosis not present

## 2024-02-28 MED ORDER — LEVETIRACETAM 500 MG PO TABS
500.0000 mg | ORAL_TABLET | Freq: Two times a day (BID) | ORAL | Status: DC
  Administered 2024-02-28 – 2024-03-03 (×9): 500 mg via ORAL
  Filled 2024-02-28 (×9): qty 1

## 2024-02-28 MED ORDER — PANTOPRAZOLE SODIUM 40 MG PO TBEC
40.0000 mg | DELAYED_RELEASE_TABLET | Freq: Every day | ORAL | Status: DC
  Administered 2024-02-28 – 2024-03-02 (×4): 40 mg via ORAL
  Filled 2024-02-28 (×4): qty 1

## 2024-02-28 NOTE — Progress Notes (Signed)
 Inpatient Rehab Admissions Coordinator:   Insurance auth pending for [employer].  I will continue to follow.   Estill Dooms, PT, DPT Admissions Coordinator [phone] 02/28/24  4:59 PM

## 2024-02-28 NOTE — Progress Notes (Signed)
 Pt back on the unit from CT. Vitals signs stable . Vicki Mallet RN

## 2024-02-28 NOTE — Progress Notes (Signed)
 Overall doing well.  Minimal headache.  No new complaints otherwise.  Afebrile.  Vital signs are stable.  Awake and alert.  Oriented and appropriate.  Motor and sensory function intact.  Wound clean and dry.  Follow-up as CT scan with bifrontal subdural air but no evidence of hemorrhage.  Patient progressing well following craniotomy and resection of large left-sided subdural hematoma.  Postoperative pneumocephalus should resolve over the next couple weeks.  Continue efforts at mobilization.  Transfer to floor.

## 2024-02-28 NOTE — Progress Notes (Signed)
 Occupational Therapy Treatment Patient Details Name: Micheal Ayers MRN: [id number] DOB: 1961-02-09 Today's Date: 02/28/2024   History of present illness Pt is a 63 y.o. M who presents 02/24/2024 with speech difficulty and right facial droop. CT head showed large left subdural hematoma. S/p left frontotemporparietal craniotomy 3/30. Significant PMH: hypertension, CVA, obstructive hydrocephalus with a VP shunt from 2024 (Dr. Jordan Likes), depression, ADD, hyperlipidemia.   OT comments  Pt received in bed, pleasant and agreeable to therapy. Pt got self to EOB with CGA and minor assistance to life trunk. C/o dizziness at EOB and BP monitored, ranges within normative values and dizziness noted to subside. Pt completed LB dressing with sit<>stand requiring CGA for safety. Pt noted to cry when discussing condition and expressed feelings, pt able to maintain composure to continue with session. Therapist provided encouragement/support. Pt ambulated to bathroom and completed self care at sink in standing and stood to void in toilet, CGA maintained throughout for safety. Pt returned to recliner with breakfast set up, all needs met. Acute OT to continue to follow to address established goals to facilitate DC to next venue of care.        If plan is discharge home, recommend the following:  Assistance with cooking/housework;Assist for transportation;A lot of help with bathing/dressing/bathroom;A lot of help with walking and/or transfers;Help with stairs or ramp for entrance   Equipment Recommendations  Tub/shower bench;BSC/3in1    Recommendations for Other Services      Precautions / Restrictions Precautions Precautions: Fall Recall of Precautions/Restrictions: Intact Restrictions Weight Bearing Restrictions Per Provider Order: No       Mobility Bed Mobility Overal bed mobility: Needs Assistance Bed Mobility: Supine to Sit     Supine to sit: Contact guard     General bed mobility comments: pt able  to perform supine to sit and required minor assistance to lift trunk, used therapist to aid in pulling self upward    Transfers Overall transfer level: Needs assistance Equipment used: Rolling walker (2 wheels) Transfers: Sit to/from Stand Sit to Stand: Contact guard assist           General transfer comment: CGA provided for safety     Balance Overall balance assessment: Needs assistance Sitting-balance support: Feet supported Sitting balance-Leahy Scale: Fair Sitting balance - Comments: pt able to maintain seated balance EOB while donning underwear   Standing balance support: Single extremity supported, Reliant on assistive device for balance, During functional activity Standing balance-Leahy Scale: Poor Standing balance comment: pt reliant on RW and grab bars and other external supports in standing                           ADL either performed or assessed with clinical judgement   ADL Overall ADL's : Needs assistance/impaired     Grooming: Wash/dry face;Oral care;Contact guard assist;Standing Grooming Details (indicate cue type and reason): completed in standing at sink             Lower Body Dressing: Contact guard assist;Sit to/from stand Lower Body Dressing Details (indicate cue type and reason): donned underwear and was able to pull up underwear while standing Toilet Transfer: Contact guard Nurse, adult Details (indicate cue type and reason): pt ambulated to regular toilet to void in standing Toileting- Clothing Manipulation and Hygiene: Contact guard assist;Minimal assistance Toileting - Clothing Manipulation Details (indicate cue type and reason): CGA maintained in standing, required min assist to hold gown up over waist to  void     Functional mobility during ADLs: Contact guard assist;Rolling walker (2 wheels) General ADL Comments: completed at sink    Extremity/Trunk Assessment              Vision        Perception     Praxis     Communication Communication Communication: No apparent difficulties   Cognition Arousal: Alert Behavior During Therapy: WFL for tasks assessed/performed Cognition: No apparent impairments             OT - Cognition Comments: pt was able to follow commands and respond during session. Pt noted crying when discussing condition and became emotional during conversation                 Following commands: Intact        Cueing   Cueing Techniques: Verbal cues  Exercises      Shoulder Instructions       General Comments c/o of dizziness initially however BP monitored and remained in normative values, subsided once up and moving    Pertinent Vitals/ Pain       Pain Assessment Pain Assessment: Faces Faces Pain Scale: Hurts little more Pain Location: c/o headache Pain Descriptors / Indicators: Other (Comment) (pt stated headache) Pain Intervention(s): Monitored during session  Home Living                                          Prior Functioning/Environment              Frequency  Min 2X/week        Progress Toward Goals  OT Goals(current goals can now be found in the care plan section)  Progress towards OT goals: Progressing toward goals  Acute Rehab OT Goals Patient Stated Goal: to improve OT Goal Formulation: With patient Time For Goal Achievement: 03/11/24 Potential to Achieve Goals: Good ADL Goals Pt Will Perform Eating: with adaptive utensils;with min assist;sitting Pt Will Perform Grooming: with min assist;standing Pt Will Perform Upper Body Bathing: with min assist;sitting Pt Will Perform Upper Body Dressing: with min assist;sitting Pt Will Perform Lower Body Dressing: with mod assist;sit to/from stand Pt Will Transfer to Toilet: with min assist;ambulating;regular height toilet  Plan      Co-evaluation                 AM-PAC OT "6 Clicks" Daily Activity     Outcome Measure    Help from another person eating meals?: A Little Help from another person taking care of personal grooming?: A Little Help from another person toileting, which includes using toliet, bedpan, or urinal?: A Lot Help from another person bathing (including washing, rinsing, drying)?: A Lot Help from another person to put on and taking off regular upper body clothing?: A Little Help from another person to put on and taking off regular lower body clothing?: A Lot 6 Click Score: 15    End of Session Equipment Utilized During Treatment: Gait belt;Rolling walker (2 wheels)  OT Visit Diagnosis: Unsteadiness on feet (R26.81);Other abnormalities of gait and mobility (R26.89);Muscle weakness (generalized) (M62.81);Feeding difficulties (R63.3);Apraxia (R48.2);Ataxia, unspecified (R27.0);Hemiplegia and hemiparesis Hemiplegia - Right/Left: Right Hemiplegia - dominant/non-dominant: Dominant Hemiplegia - caused by: Other Nontraumatic intracranial hemorrhage   Activity Tolerance Patient tolerated treatment well   Patient Left in chair;with call bell/phone within reach;with chair alarm set   Nurse Communication Mobility  status        Time: 1610-9604 OT Time Calculation (min): 27 min  Charges: OT General Charges $OT Visit: 1 Visit OT Treatments $Self Care/Home Management : 23-37 mins  Eboney Claybrook, BS, OTA/S   [place] Route 02/28/2024, 10:44 AM

## 2024-02-28 NOTE — Consult Note (Signed)
 Physical Medicine and Rehabilitation Consult Reason for Consult:Acute CIR- Rehab Referring Physician: Dr Jordan Likes   HPI: Micheal Ayers is a 64 y.o. 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 Per ED note, pt was having slurred speech, and R facial droop and possible weakness- pt's wife said Sx's were more nonspecific.   Underwent head CT when brought to ED and found to have a L large SDH that was convex and 5cm- with 1.5-2cm midline shift.  Pt was taken to OR emergently for Craniotomy- L frontotemporoparietal Crani-  Post op- pt has some pneumocephalus- but will resolve over the next 2 or so weeks per NSU.  Pt also reports that is crying over "nothing" and doesn't understand why Also per therapy notes, although was HHA min A short distance, he has significant R inattention which is impacting his ability to participate in therapy.   Per pt he's on Wellbutrin for ADHD when [employer] was taken off market.  Doesn't want to stop Wellbutrin, - is on keppra for seizure prophylaxis.   Pt reports HA- has been having one, but hasn't even asked for Tylenol for it- although it's running 2-3/10 when laying down, spikes higher up to 7-8/10 with looking upwards, it's more aggravating than anything.   Wife feels pt still somewhat confused and it confusing Sx's from VP shunt with current Sx's and thought Dr Jordan Likes "did something wrong".      Social Hx: Pt works full time at Press [employer] at [employer]- and has 2 adult children- 1 who's 25 and lives at home- wife, who is shorter than pt, is retired and does Air traffic controller.  Pt is former smoker  .    Review of Systems   Constitutional:  Positive for malaise/fatigue.  HENT: Negative.    Eyes: Negative.  Negative for blurred vision and double vision.  Respiratory: Negative.  Negative for cough and shortness of breath.   Cardiovascular: Negative.  Negative for chest pain and leg swelling.  Gastrointestinal:  Negative for abdominal pain, constipation, diarrhea, nausea and vomiting.  Genitourinary: Negative.  Negative for dysuria, frequency and urgency.  Musculoskeletal: Negative.  Negative for back pain, myalgias and neck pain.  Skin: Negative.   Neurological:  Positive for weakness and headaches.  Endo/Heme/Allergies: Negative.   Psychiatric/Behavioral:  Positive for depression and memory loss.        Crying for "no reason"  All other systems reviewed and are negative.  Past Medical History:  Diagnosis Date   ADD (attention deficit disorder)    Allergic rhinitis, cause unspecified    Chronic kidney disease    Acute renal failure   Hypertension    Stroke [facility])    Past Surgical History:  Procedure Laterality Date   CRANIOTOMY Left 02/24/2024   Procedure: CRANIOTOMY HEMATOMA EVACUATION SUBDURAL;  Surgeon: Arman Bogus, MD;  Location: [facility] OR;  Service: Neurosurgery;  Laterality: Left;   elbow surgury  1969   left elbow   INGUINAL HERNIA REPAIR  2003   left   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/2023   Procedure: Shunt Placment - right occipital;  Surgeon: Julio Sicks, MD;  Location: [facility]  OR;  Service: Neurosurgery;  Laterality: Right;   Family History  Problem Relation Age of Onset   Hypertension Other    Alcohol abuse Other    Hypertension Other    Kidney disease Other    Cancer Other    Alcohol abuse Other    Arthritis Other    Stroke Other    Hypertension Other    Cancer Other    Social History:  reports that he has quit smoking. He has never used smokeless tobacco. He reports current alcohol use. He reports that he does not use drugs. Allergies:  Allergies  Allergen Reactions    Erythromycin Base Hives   Medications Prior to Admission  Medication Sig Dispense Refill   amLODipine (NORVASC) 5 MG tablet Take 5 mg by mouth daily.     aspirin EC 81 MG tablet Take 1 tablet (81 mg total) by mouth daily. Swallow whole. 30 tablet 12   atomoxetine (STRATTERA) 80 MG capsule Take 1 capsule (80 mg total) by mouth daily. 30 capsule 5   buPROPion (WELLBUTRIN XL) 300 MG 24 hr tablet Take 1 tablet (300 mg total) by mouth daily. 30 tablet 5   Cholecalciferol (VITAMIN D3) 50 MCG (2000 UT) CAPS Take 2,000 Units by mouth daily.     cyanocobalamin (VITAMIN B12) 1000 MCG tablet Take 1,000 mcg by mouth daily.     omeprazole (PRILOSEC OTC) 20 MG tablet Take 20 mg by mouth daily.     rosuvastatin (CRESTOR) 10 MG tablet Take 1 tablet (10 mg total) by mouth daily. 30 tablet 2   tamsulosin (FLOMAX) 0.4 MG CAPS capsule Take 0.4 mg by mouth at bedtime.     valsartan-hydrochlorothiazide (DIOVAN-HCT) 80-12.5 MG tablet Take 1 tablet by mouth daily.      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 Functional Status:  Mobility: Bed Mobility Overal bed mobility: Needs Assistance Bed Mobility: Supine to Sit Supine to sit: Contact guard General bed mobility comments: pt able to perform supine to sit and required minor assistance to lift trunk, used therapist  to aid in pulling self upward Transfers Overall transfer level: Needs assistance Equipment used: Rolling walker (2 wheels) Transfers: Sit to/from Stand Sit to Stand: Contact guard assist General transfer comment: CGA provided for safety Ambulation/Gait Ambulation/Gait assistance: Min assist Gait Distance (Feet): 30 Feet Assistive device: None Gait Pattern/deviations: Step-through pattern, Decreased stride length, Drifts right/left General Gait Details: Pt requiring consistent minA throughout gait, drift towards R bumping into 2 objects despite verbal cueing, increased lateral sway, verbal cues for increased foot clearance. Chair follow utilized Gait velocity: decreased Gait velocity interpretation: <1.8 ft/sec, indicate of risk for recurrent falls    ADL: ADL Overall ADL's : Needs assistance/impaired Eating/Feeding: Moderate assistance, Sitting Grooming: Wash/dry face, Oral care, Contact guard assist, Standing Grooming Details (indicate cue type and reason): completed in standing at sink Upper Body Bathing: Moderate assistance, Sitting Lower Body Bathing: Maximal assistance, Sitting/lateral leans Upper Body Dressing : Maximal assistance, Sitting Lower  Body Dressing: Contact guard assist, Sit to/from stand Lower Body Dressing Details (indicate cue type and reason): donned underwear and was able to pull up underwear while standing Toilet Transfer: Contact guard assist, Ambulation, Regular Toilet Toilet Transfer Details (indicate cue type and reason): pt ambulated to regular toilet to void in standing Toileting- Clothing Manipulation and Hygiene: Contact guard assist, Minimal assistance Toileting - Clothing Manipulation Details (indicate cue type and reason): CGA maintained in standing, required min assist to hold gown up over waist to void Functional mobility during ADLs: Contact guard assist, Rolling walker (2 wheels) General ADL Comments: completed at  sink  Cognition: Cognition Orientation Level: Oriented X4 Cognition Arousal: Alert Behavior During Therapy: [facility] for tasks assessed/performed  Blood pressure 112/85, pulse 84, temperature 97.7 F (36.5 C), temperature source Oral, resp. rate 14, height 6' 0.01" (1.829 m), weight 107.1 kg, SpO2 93%. Physical Exam Vitals and nursing note reviewed. Exam conducted with a chaperone present.  Constitutional:      Appearance: Normal appearance. He is obese.     Comments: Pt initially on BSC, but when entered room, back on bedside chair with wife at side, awake, alert, appropriate, slightly off on higher level cognition issues, NAD  HENT:     Head: Normocephalic.     Comments: L frontotemporoparietal crani- staples look good- no drainage seen Mild R facial droop Tongue very mild R tongue deviation    Nose: Nose normal. No congestion.     Mouth/Throat:     Mouth: Mucous membranes are dry.     Pharynx: Oropharynx is clear. No oropharyngeal exudate.  Eyes:     General:        Right eye: No discharge.        Left eye: No discharge.     Extraocular Movements: Extraocular movements intact.     Comments: No nystagmus B/L EOMI B/L Peripheral vision appears to be intact on specific testing  Cardiovascular:     Rate and Rhythm: Normal rate and regular rhythm.     Heart sounds: Normal heart sounds. No murmur heard.    No gallop.  Pulmonary:     Effort: Pulmonary effort is normal. No respiratory distress.     Breath sounds: Normal breath sounds. No wheezing, rhonchi or rales.  Abdominal:     General: Bowel sounds are normal. There is no distension.     Palpations: Abdomen is soft.     Tenderness: There is no abdominal tenderness.  Musculoskeletal:     Cervical back: Neck supple. No tenderness.     Comments: LUE /5 in Deltoid, biceps, triceps, WE, grip and FA- was slightly delayed with exam RUE- 5-/5 in deltoid; 4+/5 in Biceps, triceps, WE 5-/5 And grip/FA 4+/5- significantly delayed with  participating in exam-  LLE- 5/5 in HF, KE, KF, DF and PF RLE- HF 4+/5; KE/KF 4+ to 5-/5 and DF 4+/5 and PF 4/5- less than other muscles Delayed with entire exam  Skin:    General: Skin is warm and dry.     Comments: L crani -staples intact B/L forearm IV's   Neurological:     Comments: Pt's hoffman's RUE- not in LUE Few beats clonus B/L LE's Ue's 2+ B/L DTRs, however L patella and Achilles 3+ and RLE DTR's 4+ and crossing 3/3 naming, but delayed in responding [facility] was [facility]- Thursday April 2025 Says intact to light touch in all 4 extremities and face, however keeps moving LUE towards me when asked to use RUE- cannot  touch on R side without cues, only L side.  Mild word substitutions noted in 4 different instances   Psychiatric:     Comments: Crying uncontrollably 5-6 different times and caught himself and was starting to cry 2 more times- obvious PBA      No results found for this or any previous visit (from the past 24 hours). CT HEAD WO CONTRAST ( ) Result Date: 02/28/2024 CLINICAL DATA:  63 year old male code stroke presentation on 02/24/2024 with subdural hematoma. Postoperative day 4 status post craniotomy and evacuation. EXAM: CT HEAD WITHOUT CONTRAST TECHNIQUE: Contiguous axial images were obtained from the base of the skull through the vertex without intravenous contrast. RADIATION DOSE REDUCTION: This exam was performed according to the departmental dose-optimization program which includes automated exposure control, adjustment of the mA and/or kV according to patient size and/or use of iterative reconstruction technique. COMPARISON:  Postoperative CT 02/26/2024 and earlier. FINDINGS: Brain: Moderate volume pneumocephalus which is greater in the right hemisphere now, with new mass effect on the anterior right frontal lobe similar to that on the left side (series 4, image 17). Postoperative subdural drain has been removed. At the coronal level of the basilar artery  mostly hyperdense subdural hematoma has increased, biconvex and measures up to 12 mm in thickness versus 6-8 mm previously. Stable small volume contralateral right para falcine and tentorial subdural blood. No significant midline shift despite the findings. Asymmetry of the prominent lateral ventricles has not significantly changed and there is a chronic right posterior approach CSF shunt which is stable. Basilar cisterns are stable, partially effaced suprasellar cistern. Stable gray-white matter differentiation throughout the brain. Small chronic left cerebellar infarct. Vascular: No suspicious intracranial vascular hyperdensity. Skull: Stable. Recent left parietal craniotomy. Chronic right posterior calvarium burr hole. Sinuses/Orbits: Visualized paranasal sinuses and mastoids are stable and well aerated. Other: Postoperative changes to the scalp vertex. The scalp drain has been removed. Skin staples in place. Contralateral right posterior convexity chronic shunt reservoir and tubing appears stable. Visualized orbit soft tissues are within normal limits. IMPRESSION: 1. Increasing postoperative Pneumocephalus, now affecting both anterior frontal lobes (see series 4, image 17). 2. Subdural drain removed with interval increased residual Left Subdural Hematoma; 12 mm maximal thickness now vs 6-8 mm on 02/25/2022. Small volume para falcine SDH unchanged. 3. No significant midline shift. Stable basilar cisterns. Stable CSF shunt and ventriculomegaly. Electronically Signed   By: Odessa Fleming M.D.   On: 02/28/2024 08:45     Assessment/Plan: Diagnosis: L Large SDH s/p frontotemporoparietal crani  Does the need for close, 24 hr/day medical supervision in concert with the patient's rehab needs make it unreasonable for this patient to be served in a less intensive setting? Yes Co-Morbidities requiring supervision/potential complications: Pseudobulbar affect; R inattention;  R sided hemiparesis; HTN, hx of CVA 2024 and VP  shunt due to obstructive hydrocephalus, depression, ADHD Due to bladder management, bowel management, safety, skin/wound care, disease management, medication administration, pain management, and patient education, does the patient require 24 hr/day rehab nursing? Yes Does the patient require coordinated care of a physician, rehab nurse, therapy disciplines of PT, OT and SLP to address physical and functional deficits in the context of the above medical diagnosis(es)? Yes Addressing deficits in the following areas: balance, endurance, locomotion, strength, transferring, bowel/bladder control, bathing, dressing, feeding, grooming, toileting, and cognition Can the patient actively participate in an intensive therapy program of at least 3 hrs of therapy per day at least 5 days per week? Yes The potential for  patient to make measurable gains while on inpatient rehab is good Anticipated functional outcomes upon discharge from inpatient rehab are modified independent and supervision  with PT, modified independent and supervision with OT, modified independent and supervision with SLP. Estimated rehab length of stay to reach the above functional goals is: 7-10 days Anticipated discharge destination: Home Overall Rehab/Functional Prognosis: good  RECOMMENDATIONS: This patient's condition is appropriate for continued rehabilitative care in the following setting: CIR Patient has agreed to participate in recommended program. Yes Note that insurance prior authorization may be required for reimbursement for recommended care.  Comment:  Patient has Pseudobulbar affect (PBA)- there's only one medicine that is FDA approved for this, Nudexta, however hard to get- suggest Prozac 10-20 mg daily- along with Wellbutrin (takes more for ADHD than depression)  to treat PBA - and if that doesn't work (explained it doesn't work instantaneously) than could try [employer].  Pt has R side inattention causing difficulties even with  cues telling where he is in space- I suggest therapies work on quality of gait due to inattention- as well as where he is in space- couldn't find that peripheral vision was impaired on direct exam.  Headaches- having pretty much daily since SDH- suggest tylenol for now- if continues daily- then would benefit from Topamax 25 mg BID to prevent HA's- pain is 2-3/10 at rest, but spikes to 7-8/10 with looking upward, however per pt not sharp/stabbing.  Mild upper level cognition issues noted- pt kept thinking his Large L SDH was due to Hydrocephalus form last year and that there might have "been a mistake with VP shunt"- disabused pt of this idea, but sounds like ICU doc also did so, and pt somewhat perseverative on this topic. Would strongly benefit from SLP once gets to CIR.  Will submit to admissions coordinator for Inpatient CIR admission.  Thank you for this consult- pt is ideal candidate for CIR due to balance and R hemiparesis, R inattention and higher level cognitive deficits.    Genice Rouge, MD 02/28/2024    I spent a total of 89   minutes on total care today- >50% coordination of care- due to  Pt chart reviewed- s/dw pt's wife for 15 minutes initially, then with pt interview- then exam as well as education on PBA and inattention to wife and pt- and then d/w nursing and admissions coordinator as well as documentation.

## 2024-02-28 NOTE — Progress Notes (Signed)
 Pt off the unit to CT.

## 2024-02-28 NOTE — Progress Notes (Signed)
 Physical Therapy Treatment Patient Details Name: Micheal Ayers MRN: [id number] DOB: 1961-09-24 Today's Date: 02/28/2024   History of Present Illness Pt is a 63 y.o. M who presents 02/24/2024 with speech difficulty and right facial droop. CT head showed large left subdural hematoma. S/p left frontotemporparietal craniotomy 3/30. Significant PMH: hypertension, CVA, obstructive hydrocephalus with a VP shunt from 2024 (Dr. Jordan Likes), depression, ADD, hyperlipidemia.    PT Comments  Pt received up in chair and motivated to participate. Session focused on pre gait and gait training, dynamic balance and functional strengthening. Pt requiring up to moderate assist for ambulation with R sided coordination deficits and inattention. Worked on targeted stepping with R foot to promote heel strike at initial contact and for progressive weightbearing through RLE. Pt able to locate 5/5 objects placed on right side while challenging standing balance. Patient will benefit from intensive inpatient follow-up therapy, >3 hours/day in order to address deficits and maximize functional mobility.    If plan is discharge home, recommend the following: A little help with bathing/dressing/bathroom;Assistance with cooking/housework;Assist for transportation;Help with stairs or ramp for entrance;A lot of help with walking and/or transfers   Can travel by private vehicle        Equipment Recommendations  None recommended by PT    Recommendations for Other Services       Precautions / Restrictions Precautions Precautions: Fall Restrictions Weight Bearing Restrictions Per Provider Order: No     Mobility  Bed Mobility               General bed mobility comments: OOB in chair    Transfers Overall transfer level: Needs assistance Equipment used: Rolling walker (2 wheels) Transfers: Sit to/from Stand Sit to Stand: Min assist           General transfer comment: Posterior LOB on first attempt and increased  time required to achieve upright. MinA to stabilize    Ambulation/Gait Ambulation/Gait assistance: Mod assist Gait Distance (Feet): 20 Feet Assistive device: Rolling walker (2 wheels), None Gait Pattern/deviations: Step-through pattern, Decreased stride length, Drifts right/left, Decreased dorsiflexion - right Gait velocity: decreased Gait velocity interpretation: <1.8 ft/sec, indicate of risk for recurrent falls   General Gait Details: Decreased R foot clearance, occasional scissoring, and decreased heel strike at initial contact. requiring up to [facility] for stability, improved with unilateral hand support on bed rail and bilateral hand support on RW   Stairs             Wheelchair Mobility     Tilt Bed    Modified Rankin (Stroke Patients Only) Modified Rankin (Stroke Patients Only) Pre-Morbid Rankin Score: No symptoms Modified Rankin: Moderately severe disability     Balance Overall balance assessment: Needs assistance Sitting-balance support: Feet supported Sitting balance-Leahy Scale: Fair     Standing balance support: Single extremity supported Standing balance-Leahy Scale: Poor                              Communication Communication Communication: No apparent difficulties  Cognition Arousal: Alert Behavior During Therapy: WFL for tasks assessed/performed   PT - Cognitive impairments: Awareness, Safety/Judgement                         Following commands: Intact      Cueing Cueing Techniques: Verbal cues  Exercises Other Exercises Other Exercises: Unilateral hand support on railing: targeted stepping with R foot forward and backwards with  emphasis on heel strike Other Exercises: Bilateral hand support on railing: lateral stepping to R x 10 Other Exercises: Standing: functional reaching with RUE Other Exercises: x10 sit to stands    General Comments        Pertinent Vitals/Pain Pain Assessment Pain Assessment: Faces Faces  Pain Scale: Hurts a little bit    Home Living                          Prior Function            PT Goals (current goals can now be found in the care plan section) Acute Rehab PT Goals Patient Stated Goal: to improve Potential to Achieve Goals: Good    Frequency    Min 3X/week      PT Plan      Co-evaluation              AM-PAC PT "6 Clicks" Mobility   Outcome Measure  Help needed turning from your back to your side while in a flat bed without using bedrails?: None Help needed moving from lying on your back to sitting on the side of a flat bed without using bedrails?: A Little Help needed moving to and from a bed to a chair (including a wheelchair)?: A Little Help needed standing up from a chair using your arms (e.g., wheelchair or bedside chair)?: A Little Help needed to walk in hospital room?: A Lot Help needed climbing 3-5 steps with a railing? : A Lot 6 Click Score: 17    End of Session Equipment Utilized During Treatment: Gait belt Activity Tolerance: Patient tolerated treatment well Patient left: in chair;with call bell/phone within reach;with chair alarm set Nurse Communication: Mobility status PT Visit Diagnosis: Unsteadiness on feet (R26.81)     Time: 1610-9604 PT Time Calculation (min) (ACUTE ONLY): 32 min  Charges:    $Gait Training: 8-22 mins $Therapeutic Activity: 8-22 mins PT General Charges $$ ACUTE PT VISIT: 1 Visit                     Lillia Pauls, PT, DPT Acute Rehabilitation Services Office [phone]    [name] 02/28/2024, 4:31 PM

## 2024-02-29 NOTE — Progress Notes (Signed)
 Overall stable.  Still with some headache.  No other neurologic symptoms.  Patient working with therapy.  Still unsteady walking.  Wound clean and dry.  Chest and abdomen benign.  Status post subdural hematoma evacuation complicated by VP shunt presents.  Continue efforts at mobilization and rehab.  Probably will require inpatient rehabilitation.

## 2024-02-29 NOTE — Progress Notes (Signed)
 Inpatient Rehab Admissions Coordinator:   Received a denial from [facility] with an option to complete peer to peer discussion with Engineer, agricultural (name not provided).  Per Monica Becton CM, request denied due to lack of medical necessity, despite the diagnosis of SDH s/p craniotomy for decompression.  I called to provide Dr. Rosalyn Charters information for a peer to peer and requested this be done expediently as the member was medically ready to transition to the next level of care and we had bed availability for him today.  I was informed "we do not complete peer to peer reviews on the same day, the first available would be Monday."  I advised that she please make note to try and get this done today, as the patient is medically ready and should not have to wait another 72 hours in the acute setting.    Estill Dooms, PT, DPT Admissions Coordinator [phone] 02/29/24  11:33 AM

## 2024-02-29 NOTE — Progress Notes (Signed)
 Physical Therapy Treatment Patient Details Name: Micheal Ayers MRN: [id number] DOB: 04-30-1961 Today's Date: 02/29/2024   History of Present Illness Pt is a 63 y.o. M who presents 02/24/2024 with speech difficulty and right facial droop. CT head showed large left subdural hematoma. S/p left frontotemporparietal craniotomy 3/30. Significant PMH: hypertension, CVA, obstructive hydrocephalus with a VP shunt from 2024 (Dr. Jordan Ayers), depression, ADD, hyperlipidemia.    PT Comments  Pt demonstrating progress in activity tolerance, balance and right sided coordination in comparison to yesterday's session, 4/3. Session focused on multidirectional stepping with single vs bilateral hand support, varying gait speed, and gait training. Pt requiring minimal assist for stability and continues to display right drift and inattention. Patient will benefit from intensive inpatient follow-up therapy, >3 hours/day.    If plan is discharge home, recommend the following: A little help with bathing/dressing/bathroom;Assistance with cooking/housework;Assist for transportation;Help with stairs or ramp for entrance;A little help with walking and/or transfers   Can travel by private vehicle        Equipment Recommendations  None recommended by PT    Recommendations for Other Services       Precautions / Restrictions Precautions Precautions: Fall Restrictions Weight Bearing Restrictions Per Provider Order: No     Mobility  Bed Mobility Overal bed mobility: Needs Assistance Bed Mobility: Supine to Sit     Supine to sit: Supervision          Transfers Overall transfer level: Needs assistance Equipment used: Rolling walker (2 wheels) Transfers: Sit to/from Stand Sit to Stand: Contact guard assist           General transfer comment: Verbal cues for hand placement    Ambulation/Gait Ambulation/Gait assistance: Min assist Gait Distance (Feet): 100 Feet Assistive device: Rolling walker (2 wheels)  (hall railing) Gait Pattern/deviations: Step-through pattern, Decreased stride length, Drifts right/left, Decreased dorsiflexion - right Gait velocity: decreased     General Gait Details: Verbal cues for wider BOS, increased R foot clearance, obstacle navigation due to R drift. Pt performed gait training trials with hall railing vs RW. Worked on varying gait speeds with external support and locating objects on R   Stairs             Wheelchair Mobility     Tilt Bed    Modified Rankin (Stroke Patients Only) Modified Rankin (Stroke Patients Only) Pre-Morbid Rankin Score: No symptoms Modified Rankin: Moderately severe disability     Balance Overall balance assessment: Needs assistance Sitting-balance support: Feet supported Sitting balance-Leahy Scale: Fair     Standing balance support: Single extremity supported Standing balance-Leahy Scale: Poor                              Communication Communication Communication: No apparent difficulties  Cognition Arousal: Alert Behavior During Therapy: WFL for tasks assessed/performed   PT - Cognitive impairments: Awareness, Safety/Judgement                       PT - Cognition Comments: Pt with 2 episodes of lability Following commands: Intact      Cueing Cueing Techniques: Verbal cues  Exercises Other Exercises Other Exercises: Lateral stepping to R/L    General Comments        Pertinent Vitals/Pain Pain Assessment Pain Assessment: Faces Faces Pain Scale: Hurts little more Pain Location: c/o headache Pain Descriptors / Indicators: Headache Pain Intervention(s): Patient requesting pain meds-RN notified  Home Living                          Prior Function            PT Goals (current goals can now be found in the care plan section) Acute Rehab PT Goals Patient Stated Goal: to improve Potential to Achieve Goals: Good Progress towards PT goals: Progressing toward  goals    Frequency    Min 3X/week      PT Plan      Co-evaluation              AM-PAC PT "6 Clicks" Mobility   Outcome Measure  Help needed turning from your back to your side while in a flat bed without using bedrails?: None Help needed moving from lying on your back to sitting on the side of a flat bed without using bedrails?: A Little Help needed moving to and from a bed to a chair (including a wheelchair)?: A Little Help needed standing up from a chair using your arms (e.g., wheelchair or bedside chair)?: A Little Help needed to walk in hospital room?: A Little Help needed climbing 3-5 steps with a railing? : A Lot 6 Click Score: 18    End of Session Equipment Utilized During Treatment: Gait belt Activity Tolerance: Patient tolerated treatment well Patient left: in chair;with call bell/phone within reach;with chair alarm set Nurse Communication: Mobility status PT Visit Diagnosis: Unsteadiness on feet (R26.81)     Time: 1610-9604 PT Time Calculation (min) (ACUTE ONLY): 33 min  Charges:    $Gait Training: 8-22 mins $Therapeutic Activity: 8-22 mins PT General Charges $$ ACUTE PT VISIT: 1 Visit                     Micheal Ayers, PT, DPT Acute Rehabilitation Services Office [phone]    Micheal Ayers 02/29/2024, 9:52 AM

## 2024-02-29 NOTE — Progress Notes (Signed)
 Inpatient Rehab Admissions Coordinator:   Auth pending.  Left message for [employer] case manager this morning.   Estill Dooms, PT, DPT Admissions Coordinator [phone] 02/29/24  9:25 AM

## 2024-03-01 NOTE — Progress Notes (Signed)
 Patient ID: Micheal Ayers, male   DOB: [birth year], 63 y.o.   MRN: [id number] He looks good today.  Incision is clean dry and intact.  He is moving all extremities.  He is awake and alert and conversant.  Awaiting rehab

## 2024-03-01 NOTE — Plan of Care (Signed)
  Problem: Education: Goal: Knowledge of General Education information will improve Description: Including pain rating scale, medication(s)/side effects and non-pharmacologic comfort measures Outcome: Progressing   Problem: Health Behavior/Discharge Planning: Goal: Ability to manage health-related needs will improve Outcome: Progressing   Problem: Clinical Measurements: Goal: Ability to maintain clinical measurements within normal limits will improve Outcome: Progressing Goal: Will remain free from infection Outcome: Progressing Goal: Diagnostic test results will improve Outcome: Progressing Goal: Respiratory complications will improve Outcome: Progressing Goal: Cardiovascular complication will be avoided Outcome: Progressing   Problem: Activity: Goal: Risk for activity intolerance will decrease Outcome: Progressing   Problem: Nutrition: Goal: Adequate nutrition will be maintained Outcome: Progressing   Problem: Coping: Goal: Level of anxiety will decrease Outcome: Progressing   Problem: Elimination: Goal: Will not experience complications related to bowel motility Outcome: Progressing Goal: Will not experience complications related to urinary retention Outcome: Progressing   Problem: Pain Managment: Goal: General experience of comfort will improve and/or be controlled Outcome: Progressing   Problem: Safety: Goal: Ability to remain free from injury will improve Outcome: Progressing   Problem: Education: Goal: Knowledge of the prescribed therapeutic regimen will improve Outcome: Progressing   Problem: Clinical Measurements: Goal: Usual level of consciousness will be regained or maintained. Outcome: Progressing Goal: Neurologic status will improve Outcome: Progressing Goal: Ability to maintain intracranial pressure will improve Outcome: Progressing   Problem: Skin Integrity: Goal: Demonstration of wound healing without infection will improve Outcome:  Progressing

## 2024-03-01 NOTE — Plan of Care (Signed)
  Problem: Education: Goal: Knowledge of General Education information will improve Description: Including pain rating scale, medication(s)/side effects and non-pharmacologic comfort measures 03/01/2024 0210 by Carlene Coria, RN Outcome: Progressing 02/29/2024 2307 by Carlene Coria, RN Outcome: Progressing   Problem: Health Behavior/Discharge Planning: Goal: Ability to manage health-related needs will improve 03/01/2024 0210 by Carlene Coria, RN Outcome: Progressing 02/29/2024 2307 by Carlene Coria, RN Outcome: Progressing   Problem: Clinical Measurements: Goal: Ability to maintain clinical measurements within normal limits will improve 03/01/2024 0210 by Carlene Coria, RN Outcome: Progressing 02/29/2024 2307 by Carlene Coria, RN Outcome: Progressing Goal: Will remain free from infection 03/01/2024 0210 by Carlene Coria, RN Outcome: Progressing 02/29/2024 2307 by Carlene Coria, RN Outcome: Progressing Goal: Diagnostic test results will improve 03/01/2024 0210 by Carlene Coria, RN Outcome: Progressing 02/29/2024 2307 by Carlene Coria, RN Outcome: Progressing Goal: Respiratory complications will improve 03/01/2024 0210 by Carlene Coria, RN Outcome: Progressing 02/29/2024 2307 by Carlene Coria, RN Outcome: Progressing Goal: Cardiovascular complication will be avoided 03/01/2024 0210 by Carlene Coria, RN Outcome: Progressing 02/29/2024 2307 by Carlene Coria, RN Outcome: Progressing   Problem: Activity: Goal: Risk for activity intolerance will decrease 03/01/2024 0210 by Carlene Coria, RN Outcome: Progressing 02/29/2024 2307 by Carlene Coria, RN Outcome: Progressing   Problem: Nutrition: Goal: Adequate nutrition will be maintained 03/01/2024 0210 by Carlene Coria, RN Outcome: Progressing 02/29/2024 2307 by Carlene Coria, RN Outcome: Progressing   Problem: Coping: Goal: Level of anxiety will decrease 03/01/2024  0210 by Carlene Coria, RN Outcome: Progressing 02/29/2024 2307 by Carlene Coria, RN Outcome: Progressing   Problem: Elimination: Goal: Will not experience complications related to bowel motility 03/01/2024 0210 by Carlene Coria, RN Outcome: Progressing 02/29/2024 2307 by Carlene Coria, RN Outcome: Progressing Goal: Will not experience complications related to urinary retention 03/01/2024 0210 by Carlene Coria, RN Outcome: Progressing 02/29/2024 2307 by Carlene Coria, RN Outcome: Progressing   Problem: Pain Managment: Goal: General experience of comfort will improve and/or be controlled 03/01/2024 0210 by Carlene Coria, RN Outcome: Progressing 02/29/2024 2307 by Carlene Coria, RN Outcome: Progressing   Problem: Safety: Goal: Ability to remain free from injury will improve 03/01/2024 0210 by Carlene Coria, RN Outcome: Progressing 02/29/2024 2307 by Carlene Coria, RN Outcome: Progressing   Problem: Skin Integrity: Goal: Risk for impaired skin integrity will decrease 03/01/2024 0210 by Carlene Coria, RN Outcome: Progressing 02/29/2024 2307 by Carlene Coria, RN Outcome: Progressing   Problem: Education: Goal: Knowledge of the prescribed therapeutic regimen will improve 03/01/2024 0210 by Carlene Coria, RN Outcome: Progressing 02/29/2024 2307 by Carlene Coria, RN Outcome: Progressing   Problem: Clinical Measurements: Goal: Usual level of consciousness will be regained or maintained. 03/01/2024 0210 by Carlene Coria, RN Outcome: Progressing 02/29/2024 2307 by Carlene Coria, RN Outcome: Progressing Goal: Neurologic status will improve 03/01/2024 0210 by Carlene Coria, RN Outcome: Progressing 02/29/2024 2307 by Carlene Coria, RN Outcome: Progressing Goal: Ability to maintain intracranial pressure will improve 03/01/2024 0210 by Carlene Coria, RN Outcome: Progressing 02/29/2024 2307 by Carlene Coria,  RN Outcome: Progressing   Problem: Skin Integrity: Goal: Demonstration of wound healing without infection will improve 03/01/2024 0210 by Carlene Coria, RN Outcome: Progressing 02/29/2024 2307 by Carlene Coria, RN Outcome: Progressing

## 2024-03-02 NOTE — Progress Notes (Signed)
 NEUROSURGERY PROGRESS NOTE  Doing well. No complaints this morning. No acute events overnight.   Temp:  [97.5 F (36.4 C)-98.9 F (37.2 C)] 97.6 F (36.4 C) (04/06 0713) Pulse Rate:  [76-81] 76 (04/06 0354) Resp:  [16-18] 18 (04/06 0713) BP: (134-150)/(84-101) 150/101 (04/06 0713) SpO2:  [90 %-93 %] 92 % (04/06 0713)  Sherryl Manges, NP 03/02/2024 11:01 AM

## 2024-03-02 NOTE — Plan of Care (Signed)
  Problem: Education: Goal: Knowledge of General Education information will improve Description: Including pain rating scale, medication(s)/side effects and non-pharmacologic comfort measures 03/02/2024 0009 by Carlene Coria, RN Outcome: Progressing 03/01/2024 2009 by Carlene Coria, RN Outcome: Progressing   Problem: Health Behavior/Discharge Planning: Goal: Ability to manage health-related needs will improve 03/02/2024 0009 by Carlene Coria, RN Outcome: Progressing 03/01/2024 2009 by Carlene Coria, RN Outcome: Progressing   Problem: Clinical Measurements: Goal: Ability to maintain clinical measurements within normal limits will improve 03/02/2024 0009 by Carlene Coria, RN Outcome: Progressing 03/01/2024 2009 by Carlene Coria, RN Outcome: Progressing Goal: Will remain free from infection 03/02/2024 0009 by Carlene Coria, RN Outcome: Progressing 03/01/2024 2009 by Carlene Coria, RN Outcome: Progressing Goal: Diagnostic test results will improve 03/02/2024 0009 by Carlene Coria, RN Outcome: Progressing 03/01/2024 2009 by Carlene Coria, RN Outcome: Progressing Goal: Respiratory complications will improve 03/02/2024 0009 by Carlene Coria, RN Outcome: Progressing 03/01/2024 2009 by Carlene Coria, RN Outcome: Progressing Goal: Cardiovascular complication will be avoided 03/02/2024 0009 by Carlene Coria, RN Outcome: Progressing 03/01/2024 2009 by Carlene Coria, RN Outcome: Progressing   Problem: Activity: Goal: Risk for activity intolerance will decrease 03/02/2024 0009 by Carlene Coria, RN Outcome: Progressing 03/01/2024 2009 by Carlene Coria, RN Outcome: Progressing   Problem: Nutrition: Goal: Adequate nutrition will be maintained 03/02/2024 0009 by Carlene Coria, RN Outcome: Progressing 03/01/2024 2009 by Carlene Coria, RN Outcome: Progressing   Problem: Coping: Goal: Level of anxiety will decrease 03/02/2024  0009 by Carlene Coria, RN Outcome: Progressing 03/01/2024 2009 by Carlene Coria, RN Outcome: Progressing   Problem: Elimination: Goal: Will not experience complications related to bowel motility 03/02/2024 0009 by Carlene Coria, RN Outcome: Progressing 03/01/2024 2009 by Carlene Coria, RN Outcome: Progressing Goal: Will not experience complications related to urinary retention 03/02/2024 0009 by Carlene Coria, RN Outcome: Progressing 03/01/2024 2009 by Carlene Coria, RN Outcome: Progressing   Problem: Pain Managment: Goal: General experience of comfort will improve and/or be controlled 03/02/2024 0009 by Carlene Coria, RN Outcome: Progressing 03/01/2024 2009 by Carlene Coria, RN Outcome: Progressing   Problem: Safety: Goal: Ability to remain free from injury will improve 03/02/2024 0009 by Carlene Coria, RN Outcome: Progressing 03/01/2024 2009 by Carlene Coria, RN Outcome: Progressing   Problem: Skin Integrity: Goal: Risk for impaired skin integrity will decrease 03/02/2024 0009 by Carlene Coria, RN Outcome: Progressing 03/01/2024 2009 by Carlene Coria, RN Outcome: Progressing   Problem: Education: Goal: Knowledge of the prescribed therapeutic regimen will improve 03/02/2024 0009 by Carlene Coria, RN Outcome: Progressing 03/01/2024 2009 by Carlene Coria, RN Outcome: Progressing   Problem: Clinical Measurements: Goal: Usual level of consciousness will be regained or maintained. 03/02/2024 0009 by Carlene Coria, RN Outcome: Progressing 03/01/2024 2009 by Carlene Coria, RN Outcome: Progressing Goal: Neurologic status will improve 03/02/2024 0009 by Carlene Coria, RN Outcome: Progressing 03/01/2024 2009 by Carlene Coria, RN Outcome: Progressing Goal: Ability to maintain intracranial pressure will improve 03/02/2024 0009 by Carlene Coria, RN Outcome: Progressing 03/01/2024 2009 by Carlene Coria,  RN Outcome: Progressing   Problem: Skin Integrity: Goal: Demonstration of wound healing without infection will improve 03/02/2024 0009 by Carlene Coria, RN Outcome: Progressing 03/01/2024 2009 by Carlene Coria, RN Outcome: Progressing

## 2024-03-03 ENCOUNTER — Encounter (HOSPITAL_COMMUNITY): Payer: Self-pay | Admitting: Neurosurgery

## 2024-03-03 ENCOUNTER — Encounter (HOSPITAL_COMMUNITY): Payer: Self-pay | Admitting: Physical Medicine and Rehabilitation

## 2024-03-03 ENCOUNTER — Other Ambulatory Visit: Payer: Self-pay

## 2024-03-03 ENCOUNTER — Inpatient Hospital Stay (HOSPITAL_COMMUNITY)
Admission: AD | Admit: 2024-03-03 | Discharge: 2024-03-13 | DRG: 057 | Disposition: A | Discharge status: Home / Self Care | Source: Intra-hospital | Attending: Physical Medicine and Rehabilitation | Admitting: Physical Medicine and Rehabilitation

## 2024-03-03 DIAGNOSIS — T502X5A Adverse effect of carbonic-anhydrase inhibitors, benzothiadiazides and other diuretics, initial encounter: Secondary | ICD-10-CM | POA: Diagnosis present

## 2024-03-03 DIAGNOSIS — K5900 Constipation, unspecified: Secondary | ICD-10-CM | POA: Diagnosis present

## 2024-03-03 DIAGNOSIS — G4700 Insomnia, unspecified: Secondary | ICD-10-CM | POA: Diagnosis present

## 2024-03-03 DIAGNOSIS — R5381 Other malaise: Secondary | ICD-10-CM | POA: Diagnosis present

## 2024-03-03 DIAGNOSIS — Z8249 Family history of ischemic heart disease and other diseases of the circulatory system: Secondary | ICD-10-CM

## 2024-03-03 DIAGNOSIS — D72829 Elevated white blood cell count, unspecified: Secondary | ICD-10-CM | POA: Diagnosis present

## 2024-03-03 DIAGNOSIS — N400 Enlarged prostate without lower urinary tract symptoms: Secondary | ICD-10-CM | POA: Diagnosis present

## 2024-03-03 DIAGNOSIS — R4586 Emotional lability: Secondary | ICD-10-CM

## 2024-03-03 DIAGNOSIS — I69298 Other sequelae of other nontraumatic intracranial hemorrhage: Principal | ICD-10-CM

## 2024-03-03 DIAGNOSIS — G479 Sleep disorder, unspecified: Secondary | ICD-10-CM | POA: Diagnosis not present

## 2024-03-03 DIAGNOSIS — Z888 Allergy status to other drugs, medicaments and biological substances status: Secondary | ICD-10-CM | POA: Diagnosis not present

## 2024-03-03 DIAGNOSIS — Z8673 Personal history of transient ischemic attack (TIA), and cerebral infarction without residual deficits: Secondary | ICD-10-CM

## 2024-03-03 DIAGNOSIS — F32A Depression, unspecified: Secondary | ICD-10-CM | POA: Diagnosis present

## 2024-03-03 DIAGNOSIS — F411 Generalized anxiety disorder: Secondary | ICD-10-CM

## 2024-03-03 DIAGNOSIS — I69119 Unspecified symptoms and signs involving cognitive functions following nontraumatic intracerebral hemorrhage: Secondary | ICD-10-CM

## 2024-03-03 DIAGNOSIS — Z87891 Personal history of nicotine dependence: Secondary | ICD-10-CM | POA: Diagnosis not present

## 2024-03-03 DIAGNOSIS — F988 Other specified behavioral and emotional disorders with onset usually occurring in childhood and adolescence: Secondary | ICD-10-CM | POA: Diagnosis present

## 2024-03-03 DIAGNOSIS — F419 Anxiety disorder, unspecified: Secondary | ICD-10-CM | POA: Insufficient documentation

## 2024-03-03 DIAGNOSIS — Z79899 Other long term (current) drug therapy: Secondary | ICD-10-CM

## 2024-03-03 DIAGNOSIS — Z982 Presence of cerebrospinal fluid drainage device: Secondary | ICD-10-CM | POA: Diagnosis not present

## 2024-03-03 DIAGNOSIS — E871 Hypo-osmolality and hyponatremia: Secondary | ICD-10-CM | POA: Diagnosis present

## 2024-03-03 DIAGNOSIS — I951 Orthostatic hypotension: Secondary | ICD-10-CM | POA: Diagnosis present

## 2024-03-03 DIAGNOSIS — K219 Gastro-esophageal reflux disease without esophagitis: Secondary | ICD-10-CM | POA: Diagnosis present

## 2024-03-03 DIAGNOSIS — Z9889 Other specified postprocedural states: Secondary | ICD-10-CM

## 2024-03-03 DIAGNOSIS — R4189 Other symptoms and signs involving cognitive functions and awareness: Secondary | ICD-10-CM | POA: Diagnosis present

## 2024-03-03 DIAGNOSIS — S065XAA Traumatic subdural hemorrhage with loss of consciousness status unknown, initial encounter: Secondary | ICD-10-CM | POA: Diagnosis not present

## 2024-03-03 DIAGNOSIS — Z823 Family history of stroke: Secondary | ICD-10-CM

## 2024-03-03 DIAGNOSIS — G911 Obstructive hydrocephalus: Secondary | ICD-10-CM

## 2024-03-03 DIAGNOSIS — I10 Essential (primary) hypertension: Secondary | ICD-10-CM | POA: Diagnosis present

## 2024-03-03 DIAGNOSIS — Z7982 Long term (current) use of aspirin: Secondary | ICD-10-CM | POA: Diagnosis not present

## 2024-03-03 DIAGNOSIS — F410 Panic disorder [episodic paroxysmal anxiety] without agoraphobia: Secondary | ICD-10-CM | POA: Diagnosis present

## 2024-03-03 LAB — BASIC METABOLIC PANEL WITH GFR
Anion gap: 12 (ref 5–15)
BUN: 30 mg/dL — ABNORMAL HIGH (ref 8–23)
CO2: 20 mmol/L — ABNORMAL LOW (ref 22–32)
Calcium: 9.3 mg/dL (ref 8.9–10.3)
Chloride: 103 mmol/L (ref 98–111)
Creatinine, Ser: 1.12 mg/dL (ref 0.61–1.24)
GFR, Estimated: >60 mL/min (ref >60)
Glucose, Bld: 217 mg/dL — ABNORMAL HIGH (ref 70–99)
Potassium: 4.1 mmol/L (ref 3.5–5.1)
Sodium: 135 mmol/L (ref 135–145)

## 2024-03-03 MED ORDER — PROCHLORPERAZINE 25 MG RE SUPP: 12.5000 mg | Freq: Four times a day (QID) | RECTAL | Status: DC | PRN

## 2024-03-03 MED ORDER — PROCHLORPERAZINE MALEATE 5 MG PO TABS: 5.0000 mg | ORAL_TABLET | Freq: Four times a day (QID) | ORAL | Status: DC | PRN

## 2024-03-03 MED ORDER — HYDROCODONE-ACETAMINOPHEN 5-325 MG PO TABS
1.0000 | ORAL_TABLET | ORAL | Status: DC | PRN
  Filled 2024-03-03: qty 1

## 2024-03-03 MED ORDER — DIPHENHYDRAMINE HCL 25 MG PO CAPS: 25.0000 mg | ORAL_CAPSULE | Freq: Four times a day (QID) | ORAL | Status: DC | PRN

## 2024-03-03 MED ORDER — HYDROCHLOROTHIAZIDE 12.5 MG PO TABS
12.5000 mg | ORAL_TABLET | Freq: Every day | ORAL | Status: DC
  Administered 2024-03-04: 12.5 mg via ORAL
  Filled 2024-03-03: qty 1

## 2024-03-03 MED ORDER — SENNA 8.6 MG PO TABS
2.0000 | ORAL_TABLET | Freq: Every day | ORAL | Status: DC
  Administered 2024-03-04 – 2024-03-10 (×4): 17.2 mg via ORAL
  Filled 2024-03-03 (×7): qty 2

## 2024-03-03 MED ORDER — ATOMOXETINE HCL 40 MG PO CAPS
80.0000 mg | ORAL_CAPSULE | Freq: Every day | ORAL | Status: DC
  Administered 2024-03-04 – 2024-03-13 (×10): 80 mg via ORAL
  Filled 2024-03-03 (×10): qty 2

## 2024-03-03 MED ORDER — ALUM & MAG HYDROXIDE-SIMETH 200-200-20 MG/5ML PO SUSP: 30.0000 mL | ORAL | Status: DC | PRN

## 2024-03-03 MED ORDER — PANTOPRAZOLE SODIUM 40 MG PO TBEC
40.0000 mg | DELAYED_RELEASE_TABLET | Freq: Every day | ORAL | Status: DC
  Administered 2024-03-03 – 2024-03-12 (×10): 40 mg via ORAL
  Filled 2024-03-03 (×10): qty 1

## 2024-03-03 MED ORDER — TAMSULOSIN HCL 0.4 MG PO CAPS
0.4000 mg | ORAL_CAPSULE | Freq: Every day | ORAL | Status: DC
  Administered 2024-03-03 – 2024-03-12 (×10): 0.4 mg via ORAL
  Filled 2024-03-03 (×10): qty 1

## 2024-03-03 MED ORDER — ACETAMINOPHEN 325 MG PO TABS: 650.0000 mg | ORAL_TABLET | ORAL | Status: DC | PRN

## 2024-03-03 MED ORDER — ACETAMINOPHEN 325 MG PO TABS
325.0000 mg | ORAL_TABLET | ORAL | Status: DC | PRN
  Administered 2024-03-04 – 2024-03-11 (×6): 650 mg via ORAL
  Filled 2024-03-03 (×7): qty 2

## 2024-03-03 MED ORDER — FLEET ENEMA RE ENEM: 1.0000 | ENEMA | Freq: Once | RECTAL | Status: DC | PRN

## 2024-03-03 MED ORDER — GUAIFENESIN-DM 100-10 MG/5ML PO SYRP: 5.0000 mL | ORAL_SOLUTION | Freq: Four times a day (QID) | ORAL | Status: DC | PRN

## 2024-03-03 MED ORDER — IRBESARTAN 75 MG PO TABS
75.0000 mg | ORAL_TABLET | Freq: Every day | ORAL | Status: DC
  Administered 2024-03-04 – 2024-03-13 (×10): 75 mg via ORAL
  Filled 2024-03-03 (×11): qty 1

## 2024-03-03 MED ORDER — BISACODYL 10 MG RE SUPP: 10.0000 mg | Freq: Every day | RECTAL | Status: DC | PRN

## 2024-03-03 MED ORDER — PROCHLORPERAZINE EDISYLATE 10 MG/2ML IJ SOLN: 5.0000 mg | Freq: Four times a day (QID) | INTRAMUSCULAR | Status: DC | PRN

## 2024-03-03 MED ORDER — POLYETHYLENE GLYCOL 3350 17 G PO PACK
17.0000 g | PACK | Freq: Every day | ORAL | Status: DC
  Administered 2024-03-04 – 2024-03-10 (×4): 17 g via ORAL
  Filled 2024-03-03 (×9): qty 1

## 2024-03-03 MED ORDER — LEVETIRACETAM 250 MG PO TABS
500.0000 mg | ORAL_TABLET | Freq: Two times a day (BID) | ORAL | Status: DC
  Administered 2024-03-03 – 2024-03-11 (×16): 500 mg via ORAL
  Filled 2024-03-03 (×16): qty 2

## 2024-03-03 MED ORDER — BUPROPION HCL ER (XL) 300 MG PO TB24
300.0000 mg | ORAL_TABLET | Freq: Every day | ORAL | Status: DC
  Administered 2024-03-04 – 2024-03-13 (×10): 300 mg via ORAL
  Filled 2024-03-03 (×10): qty 1

## 2024-03-03 MED ORDER — MELATONIN 5 MG PO TABS: 5.0000 mg | ORAL_TABLET | Freq: Every evening | ORAL | Status: DC | PRN

## 2024-03-03 NOTE — Plan of Care (Signed)
  Problem: Consults Goal: RH BRAIN INJURY PATIENT EDUCATION Description: Description: See Patient Education module for eduction specifics Outcome: Progressing   Problem: RH BOWEL ELIMINATION Goal: RH STG MANAGE BOWEL WITH ASSISTANCE Description: STG Manage Bowel with Mod I Assistance. Outcome: Progressing   Problem: RH BLADDER ELIMINATION Goal: RH STG MANAGE BLADDER WITH ASSISTANCE Description: STG Manage Bladder With  Mod I  Assistance Outcome: Progressing   Problem: RH SKIN INTEGRITY Goal: RH STG SKIN FREE OF INFECTION/BREAKDOWN Description: Manage skin free of infection/breakdown with mod I assistance Outcome: Progressing   Problem: RH SAFETY Goal: RH STG ADHERE TO SAFETY PRECAUTIONS W/ASSISTANCE/DEVICE Description: STG Adhere to Safety Precautions With  mod I Assistance/Device. Outcome: Progressing   Problem: RH COGNITION-NURSING Goal: RH STG USES MEMORY AIDS/STRATEGIES W/ASSIST TO PROBLEM SOLVE Description: STG Uses Memory Aids/Strategies With  Mod I Assistance to Problem Solve. Outcome: Progressing   Problem: RH PAIN MANAGEMENT Goal: RH STG PAIN MANAGED AT OR BELOW PT'S PAIN GOAL Description: <4 w/ prns Outcome: Progressing   Problem: RH KNOWLEDGE DEFICIT BRAIN INJURY Goal: RH STG INCREASE KNOWLEDGE OF SELF CARE AFTER BRAIN INJURY Description: Manage increase knowledge of self care after brain injury with mod I assistance from wife using educational materials provided Outcome: Progressing

## 2024-03-03 NOTE — Progress Notes (Signed)
 Ranelle Oyster, MD  Physician Physical Medicine and Rehabilitation   PMR Pre-admission    Signed   Date of Service: 02/27/2024  2:45 PM  Related encounter: ED to Hosp-Admission (Current) from 02/24/2024 in [place]   Signed     Expand All Collapse All  PMR Admission Coordinator Pre-Admission Assessment   Patient: Micheal Ayers is an 63 y.o., male MRN: [id number] DOB: 10/07/1961 Height: 6' 0.01" (182.9 cm) Weight: 107.1 kg                                                                                                                                                  Insurance Information HMO:     PPO:      PCP:      IPA:      80/20:      OTHER:  PRIMARY: [employer] Choice POS II      Policy#: [id number]      Subscriber: pt CM Name: Charisse March      Phone#: [phone]     Fax#: [phone] Pre-Cert#: [id number] auth for CIR from [place] with [employer] after peer to peer with updates due to fax listed above on 03/10/24      Employer: [employer] Benefits:  Phone #:      Name:  Eff. Date: 02/26/24     Deduct: $650 ($0 met)      Out of Pocket Max: $3000 ($0 met)      Life Max: n/a  CIR: 80%      SNF: 80% Outpatient:      Co-Pay: $30/visit Home Health: 80%      Co-Pay: 20% DME: 80%     Co-Pay: 20% Providers:  SECONDARY:       Policy#:       Phone#:    Artist:       Phone#:    The Engineer, materials Information Summary" for patients in Inpatient Rehabilitation Facilities with attached "Privacy Act Statement-Health Care Records" was provided and verbally reviewed with: Patient and Family   Emergency Contact Information Contact Information       Name Relation Home Work Mobile    Glacier Spouse     [phone]         Other Contacts   None on File      Current Medical History  Patient Admitting Diagnosis: SDH    History of Present Illness: PT is a 63 y/o male with PMH of obstructive hydrocephalus s/p VP Shunt in 2024 per Dr.  Jordan Likes, previous CVA, and kidney disease who presented to [facility] on 02/24/24 with c/o speech difficulty and R facial droop.  Imaging revealed large L SDH with 1.5-2 cm midline shift, for which neurosurgery was consulted and recommendations were for a L craniotomy for evacuation.  Pt to OR with Dr. Yetta Barre  on 3/30.  Follow up CT revealed resolution of majority of SDH, improvement of MLS, and significant left sided pneumocephalus which improved on serial CTs.  Therapy evaluations completed and pt was recommended for CIR.    Complete NIHSS TOTAL: 3 Glasgow Coma Scale Score: 15   Patient's medical record from [facility] has been reviewed by the rehabilitation admission coordinator and physician.   Past Medical History      Past Medical History:  Diagnosis Date   ADD (attention deficit disorder)     Allergic rhinitis, cause unspecified     Chronic kidney disease      Acute renal failure   Hypertension     Stroke [facility])            Has the patient had major surgery during 100 days prior to admission? Yes   Family History  family history includes Alcohol abuse in some other family members; Arthritis in an other family member; Cancer in some other family members; Hypertension in some other family members; Kidney disease in an other family member; Stroke in an other family member.     Current Medications   Current Medications    Current Facility-Administered Medications:    acetaminophen (TYLENOL) tablet 650 mg, 650 mg, Oral, Q4H PRN **OR** acetaminophen (TYLENOL) suppository 650 mg, 650 mg, Rectal, Q4H PRN, Arman Bogus, MD   atomoxetine (STRATTERA) capsule 80 mg, 80 mg, Oral, Daily, Arman Bogus, MD, 80 mg at 02/27/24 4401   buPROPion (WELLBUTRIN XL) 24 hr tablet 300 mg, 300 mg, Oral, Daily, Arman Bogus, MD, 300 mg at 02/27/24 0272   Chlorhexidine Gluconate Cloth 2 % PADS 6 each, 6 each, Topical, Daily, Arman Bogus, MD, 6 each at 02/26/24 1600   [COMPLETED]  dexamethasone (DECADRON) injection 6 mg, 6 mg, Intravenous, Q6H, 6 mg at 02/25/24 1155 **FOLLOWED BY** [COMPLETED] dexamethasone (DECADRON) injection 4 mg, 4 mg, Intravenous, Q6H, 4 mg at 02/26/24 1142 **FOLLOWED BY** dexamethasone (DECADRON) injection 4 mg, 4 mg, Intravenous, Q8H, Arman Bogus, MD, 4 mg at 02/27/24 1348   irbesartan (AVAPRO) tablet 75 mg, 75 mg, Oral, Daily, 75 mg at 02/27/24 0917 **AND** hydrochlorothiazide (HYDRODIURIL) tablet 12.5 mg, 12.5 mg, Oral, Daily, Arman Bogus, MD, 12.5 mg at 02/27/24 5366   HYDROcodone-acetaminophen (NORCO/VICODIN) 5-325 MG per tablet 1 tablet, 1 tablet, Oral, Q4H PRN, Arman Bogus, MD, 1 tablet at 02/26/24 1210   labetalol (NORMODYNE) injection 10-40 mg, 10-40 mg, Intravenous, Q10 min PRN, Julio Sicks, MD   levETIRAcetam (KEPPRA) IVPB 500 mg/100 mL premix, 500 mg, Intravenous, Q12H, Arman Bogus, MD, Last Rate: 400 mL/hr at 02/27/24 0411, 500 mg at 02/27/24 0411   morphine (PF) 2 MG/ML injection 1-2 mg, 1-2 mg, Intravenous, Q2H PRN, Arman Bogus, MD   ondansetron [facility]) tablet 4 mg, 4 mg, Oral, Q4H PRN **OR** ondansetron (ZOFRAN) injection 4 mg, 4 mg, Intravenous, Q4H PRN, Arman Bogus, MD   Oral care mouth rinse, 15 mL, Mouth Rinse, PRN, Arman Bogus, MD   pantoprazole (PROTONIX) injection 40 mg, 40 mg, Intravenous, QHS, Arman Bogus, MD, 40 mg at 02/26/24 2119   polyethylene glycol (MIRALAX / GLYCOLAX) packet 17 g, 17 g, Oral, Daily, Steffanie Dunn, DO, 17 g at 02/27/24 4403   promethazine (PHENERGAN) tablet 12.5-25 mg, 12.5-25 mg, Oral, Q4H PRN, Arman Bogus, MD   senna [facility]) tablet 8.6 mg, 1 tablet, Oral, BID, Arman Bogus, MD, 8.6 mg at 02/27/24 [phone]  Patients Current Diet:  Diet Order                  Diet regular Room service appropriate? 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   Self Care: Did the patient need help bathing, dressing, using the toilet or eating?  Independent   Indoor Mobility: Did the patient need assistance with walking from room to room (with or without device)? Independent   Stairs: Did the patient need assistance with internal or external stairs (with or without device)? Independent   Functional Cognition: Did the patient need help planning regular tasks such as shopping or remembering to take medications? Independent   Patient Information Are you of Hispanic, Latino/a,or Spanish origin?: A. No, not of Hispanic, Latino/a, or Spanish origin What is your race?: A. White Do you need or want an interpreter to communicate with a doctor or health care staff?: 0. No   Patient's Response To:  Health Literacy and Transportation Is the patient able to respond to health literacy and transportation needs?: Yes Health Literacy - How often do you need to have someone help you when you read instructions, pamphlets, or other written material from your doctor or pharmacy?: Never In the past 12 months, has lack of transportation kept you from medical appointments or from getting medications?: No In the past 12 months, has lack of transportation kept you from meetings, work, or from getting things needed for daily living?: No   Home Assistive Devices / Equipment Home Equipment: None   Prior Device Use:  Indicate devices/aids used by the patient prior to current illness, exacerbation or injury? None of the above   Current Functional Level Cognition   Orientation Level: Oriented X4    Extremity Assessment (includes Sensation/Coordination)   Upper Extremity Assessment: Right hand dominant, RUE deficits/detail, LUE deficits/detail RUE Deficits / Details: Delayed recruiment of muscles for MMT.  Weak grip, difficulty bringing hand to mouth.  3+/5 MMT RUE Sensation: WNL RUE Coordination: decreased fine motor, decreased gross motor LUE Deficits / Details: Having mild coordination deficits to the L UE as well, not as pronounced as the R.  Able to bring L hand to his mouth, but delayed as well. LUE Sensation: WNL LUE Coordination: decreased fine motor, decreased gross motor  Lower Extremity Assessment: Defer to PT evaluation     ADLs   Overall ADL's : Needs assistance/impaired Eating/Feeding: Moderate assistance, Sitting Grooming: Wash/dry hands, Wash/dry face, Moderate assistance, Sitting Upper Body Bathing: Moderate assistance, Sitting Lower Body Bathing: Maximal assistance, Sitting/lateral leans Upper Body Dressing : Maximal assistance, Sitting Lower Body Dressing: Maximal assistance, Sitting/lateral leans Toilet Transfer: Moderate assistance, Stand-pivot, BSC/3in1     Mobility   Overal bed mobility: Needs Assistance Bed Mobility: Supine to Sit Supine to sit: Supervision General bed mobility comments: OOB in  chair     Transfers   Overall transfer level: Needs assistance Equipment used: None Transfers: Sit to/from Stand Sit to Stand: Contact guard assist General transfer comment: CGA for safety     Ambulation / Gait / Stairs / Wheelchair Mobility   Ambulation/Gait Ambulation/Gait assistance: Editor, commissioning (Feet): 30 Feet Assistive device: None Gait Pattern/deviations: Step-through pattern, Decreased stride length, Drifts right/left General Gait Details: Pt requiring  consistent minA throughout gait, drift towards R bumping into 2 objects despite verbal cueing, increased lateral sway, verbal cues for increased foot clearance. Chair follow utilized Gait velocity: decreased Gait velocity interpretation: <1.8 ft/sec, indicate of risk for recurrent falls     Posture / Balance Dynamic Sitting Balance Sitting balance - Comments: One episode of posterior LOB; pt able to correct with CGA and verbal cueing Balance Overall balance assessment: Needs assistance Sitting-balance support: Feet supported Sitting balance-Leahy Scale: Fair Sitting balance - Comments: One episode of posterior LOB; pt able to correct with CGA and verbal cueing Standing balance support: No upper extremity supported, During functional activity Standing balance-Leahy Scale: Poor Standing balance comment: reliant on external assist by therapist     Special needs/care consideration Skin surgical incision to L scalp        Previous Home Environment (from acute therapy documentation) Living Arrangements: Spouse/significant other, Children Available Help at Discharge: Family Type of Home: House Home Layout: Able to live on main level with bedroom/bathroom Home Access: Stairs to enter Entrance Stairs-Rails: Left Entrance Stairs-Number of Steps: 2 Bathroom Shower/Tub: Armed forces operational officer Accessibility: Yes   Discharge Living Setting Plans for Discharge Living Setting: Patient's home, Lives with (comment) (spouse) Type of Home at Discharge: House Discharge Home Layout: Able to live on main level with bedroom/bathroom Discharge Home Access: Stairs to enter Entrance Stairs-Rails: Left Entrance Stairs-Number of Steps: 2 Discharge Bathroom Shower/Tub: Walk-in shower Discharge Bathroom Toilet: Standard Discharge Bathroom Accessibility: Yes How Accessible: Accessible via walker Does the patient have any problems obtaining your medications?: No    Social/Family/Support Systems Patient Roles: Spouse, Parent Anticipated Caregiver: spouse, Lynnea Ferrier, is primary Anticipated Industrial/product designer Information: [phone] Ability/Limitations of Caregiver: supervision only and intial only Caregiver Availability: 24/7 Discharge Plan Discussed with Primary Caregiver: Yes Is Caregiver In Agreement with Plan?: Yes Does Caregiver/Family have Issues with Lodging/Transportation while Pt is in Rehab?: No     Goals Patient/Family Goal for Rehab: PT/OT/SLP supervision to mod I Expected length of stay: 7-10 days Additional Information: Discharge plan: return home with pt's spouse to provide intial 24/7 supervision Pt/Family Agrees to Admission and willing to participate: Yes Program Orientation Provided & Reviewed with Pt/Caregiver Including Roles  & Responsibilities: Yes     Decrease burden of Care through IP rehab admission: n/a     Possible need for SNF placement upon discharge: Not anticipated.  Plan for discharge home with spouse to provide initial 24/7 supervision.  Estimate intermittent mod I goals.       Patient Condition: This patient's medical and functional status has changed since the consult dated: 02/28/24 in which the Rehabilitation Physician determined and documented that the patient's condition is appropriate for intensive rehabilitative care in an inpatient rehabilitation facility. See "History of Present Illness" (above) for medical update. Functional changes are: pt min assist for mobility, max assist for ADLs. Patient's medical and functional status update has been discussed with the Rehabilitation physician and patient remains appropriate for inpatient rehabilitation. Will admit to inpatient rehab today.   Preadmission Screen Completed By:  Stephania Fragmin, PT, DPT 02/27/2024 2:45 PM ______________________________________________________________________   Discussed status with Dr. Riley Kill on 03/03/24 at 1:46 PM  and received approval  for admission today.   Admission Coordinator:  Stephania Fragmin, PT, DPT time 1:46 PM Dorna Bloom 03/03/24              Revision History

## 2024-03-03 NOTE — Progress Notes (Signed)
 Inpatient Rehab Admissions Coordinator:   Dr. Riley Kill completed peer to peer this AM and was able to get CIR approved.  I've contacted medical team for d/c order and pt's spouse is aware and in agreement.   Estill Dooms, PT, DPT Admissions Coordinator [phone] 03/03/24  1:53 PM

## 2024-03-03 NOTE — Progress Notes (Signed)
 Physical Therapy Treatment Patient Details Name: Micheal Ayers MRN: [id number] DOB: [birth year] Today's Date: 03/03/2024   History of Present Illness Pt is a 63 y.o. M who presents 02/24/2024 with speech difficulty and right facial droop. CT head showed large left subdural hematoma. S/p left frontotemporparietal craniotomy 3/30. Significant PMH: hypertension, CVA, obstructive hydrocephalus with a VP shunt from 2024 (Dr. Jordan Likes), depression, ADD, hyperlipidemia.    PT Comments  Pt making excellent progress towards his physical therapy goals, with improved right sided attention and coordination this session. Still benefits from external support of rolling walker, which is not his baseline of being independent and working. Session focused on gait training with varying amounts of hand support, standing strengthening exercises, and dynamic balance. Patient will benefit from intensive inpatient follow-up therapy, >3 hours/day in order to address deficits and maximize functional independence.     If plan is discharge home, recommend the following: A little help with bathing/dressing/bathroom;Assistance with cooking/housework;Assist for transportation;Help with stairs or ramp for entrance;A little help with walking and/or transfers   Can travel by private vehicle        Equipment Recommendations  None recommended by PT    Recommendations for Other Services       Precautions / Restrictions Precautions Precautions: Fall Restrictions Weight Bearing Restrictions Per Provider Order: No     Mobility  Bed Mobility Overal bed mobility: Modified Independent                  Transfers Overall transfer level: Needs assistance Equipment used: Rolling walker (2 wheels) Transfers: Sit to/from Stand Sit to Stand: Contact guard assist           General transfer comment: Pt self cueing for hand placement    Ambulation/Gait Ambulation/Gait assistance: Min assist Gait Distance (Feet): 60  Feet (60", 60") Assistive device: Rolling walker (2 wheels), None (hall railing) Gait Pattern/deviations: Step-through pattern, Decreased stride length, Drifts right/left, Decreased dorsiflexion - right Gait velocity: 1.22 ft/s Gait velocity interpretation: <1.31 ft/sec, indicative of household ambulator   General Gait Details: Gait training with RW vs hall railing vs no hand support. Verbal cues for walker proximity, obstacle negotation, and R heel strike at initial contact. Pt with increased shuffling gait and decreased gait speed with no AD in comparison to with external support and requiring up to minA.   Stairs             Wheelchair Mobility     Tilt Bed    Modified Rankin (Stroke Patients Only) Modified Rankin (Stroke Patients Only) Pre-Morbid Rankin Score: No symptoms Modified Rankin: Moderately severe disability     Balance Overall balance assessment: Needs assistance Sitting-balance support: Feet supported Sitting balance-Leahy Scale: Good     Standing balance support: Single extremity supported Standing balance-Leahy Scale: Poor                              Communication Communication Communication: No apparent difficulties  Cognition Arousal: Alert Behavior During Therapy: WFL for tasks assessed/performed   PT - Cognitive impairments: Awareness, Safety/Judgement                       PT - Cognition Comments: Pt still feels hydrocephalus and SDH are related Following commands: Intact      Cueing Cueing Techniques: Verbal cues  Exercises Other Exercises Other Exercises: Lateral stepping to R/L x 10 ft in each direction (2 reps) Other Exercises:  Standing with light bilateral hand support: mini squats x 10 Other Exercises: Standing: dynamic functional reaching task with RUE making shapes with magnets    General Comments        Pertinent Vitals/Pain Pain Assessment Pain Assessment: 0-10 Pain Score: 2  Pain Location: c/o  headache Pain Descriptors / Indicators: Headache Pain Intervention(s): Monitored during session    Home Living                          Prior Function            PT Goals (current goals can now be found in the care plan section) Acute Rehab PT Goals Patient Stated Goal: to improve Potential to Achieve Goals: Good Progress towards PT goals: Progressing toward goals    Frequency    Min 3X/week      PT Plan      Co-evaluation              AM-PAC PT "6 Clicks" Mobility   Outcome Measure  Help needed turning from your back to your side while in a flat bed without using bedrails?: None Help needed moving from lying on your back to sitting on the side of a flat bed without using bedrails?: A Little Help needed moving to and from a bed to a chair (including a wheelchair)?: A Little Help needed standing up from a chair using your arms (e.g., wheelchair or bedside chair)?: A Little Help needed to walk in hospital room?: A Little Help needed climbing 3-5 steps with a railing? : A Lot 6 Click Score: 18    End of Session Equipment Utilized During Treatment: Gait belt Activity Tolerance: Patient tolerated treatment well Patient left: in chair;with call bell/phone within reach;with chair alarm set Nurse Communication: Mobility status PT Visit Diagnosis: Unsteadiness on feet (R26.81)     Time: 9811-9147 PT Time Calculation (min) (ACUTE ONLY): 36 min  Charges:    $Gait Training: 8-22 mins $Therapeutic Activity: 8-22 mins PT General Charges $$ ACUTE PT VISIT: 1 Visit                     Lillia Pauls, PT, DPT Acute Rehabilitation Services Office [phone]    [name] 03/03/2024, 9:05 AM

## 2024-03-03 NOTE — TOC Transition Note (Signed)
 Transition of Care (TOC) - Discharge Note Donn Pierini RN,BSN Transitions of Care Unit 4NP (Non Trauma)- RN Case Manager See Treatment Team for direct Phone #   Patient Details  Name: Micheal Ayers MRN: [id number] Date of Birth: 09/06/1961  [facility] of [facility] CM/SW Contact:  Darrold Span, RN Phone Number: 03/03/2024, 4:13 PM   Clinical Narrative:    Noted pt has been approved for INPT rehab after P2P completed this am. Bed available for admit today. Pt has been cleared to transition to [facility] INPT rehab.   No further TOC needs/interventions noted.    Final next level of care: IP Rehab Facility Barriers to Discharge: Barriers Resolved   Patient Goals and CMS Choice Patient states their goals for this hospitalization and ongoing recovery are:: INPT rehab CMS Medicare.gov Compare Post Acute Care list provided to:: Patient        Discharge Placement               [facility] INPT rehab        Discharge Plan and Services Additional resources added to the After Visit Summary for       Post Acute Care Choice: IP Rehab                               Social Drivers of Health (SDOH) Interventions SDOH Screenings   Food Insecurity: No Food Insecurity (02/24/2024)  Housing: Low Risk  (02/24/2024)  Transportation Needs: No Transportation Needs (02/24/2024)  Utilities: Not At Risk (02/24/2024)  Tobacco Use: Medium Risk (02/24/2024)     Readmission Risk Interventions    03/03/2024    4:13 PM  Readmission Risk Prevention Plan  Medication Screening Complete  Transportation Screening Complete

## 2024-03-03 NOTE — Progress Notes (Signed)
 Inpatient Rehab Admissions Coordinator:   Still waiting on [employer] medical director to contact Dr. Riley Kill for peer to peer.  Will follow.   Estill Dooms, PT, DPT Admissions Coordinator [phone] 03/03/24  9:13 AM

## 2024-03-03 NOTE — Progress Notes (Signed)
 Overall progressing well.  Headaches stable.  Making some progress with physical therapy but remains unsteady on his feet.  Afebrile.  Vital signs are stable.  Awake and alert.  Oriented and appropriate.  Motor and sensory function intact.  Wound well-healed.  Progressing well following large left subdural hematoma evacuation.  Continue efforts at mobilization and therapy.  Patient would benefit from inpatient rehab and are awaiting rehab bed.

## 2024-03-03 NOTE — Plan of Care (Signed)
  Problem: Education: Goal: Knowledge of General Education information will improve Description: Including pain rating scale, medication(s)/side effects and non-pharmacologic comfort measures 03/03/2024 0203 by Carlene Coria, RN Outcome: Progressing 03/02/2024 2004 by Carlene Coria, RN Outcome: Progressing   Problem: Health Behavior/Discharge Planning: Goal: Ability to manage health-related needs will improve 03/03/2024 0203 by Carlene Coria, RN Outcome: Progressing 03/02/2024 2004 by Carlene Coria, RN Outcome: Progressing   Problem: Clinical Measurements: Goal: Ability to maintain clinical measurements within normal limits will improve 03/03/2024 0203 by Carlene Coria, RN Outcome: Progressing 03/02/2024 2004 by Carlene Coria, RN Outcome: Progressing Goal: Will remain free from infection 03/03/2024 0203 by Carlene Coria, RN Outcome: Progressing 03/02/2024 2004 by Carlene Coria, RN Outcome: Progressing Goal: Diagnostic test results will improve 03/03/2024 0203 by Carlene Coria, RN Outcome: Progressing 03/02/2024 2004 by Carlene Coria, RN Outcome: Progressing Goal: Respiratory complications will improve 03/03/2024 0203 by Carlene Coria, RN Outcome: Progressing 03/02/2024 2004 by Carlene Coria, RN Outcome: Progressing Goal: Cardiovascular complication will be avoided 03/03/2024 0203 by Carlene Coria, RN Outcome: Progressing 03/02/2024 2004 by Carlene Coria, RN Outcome: Progressing   Problem: Activity: Goal: Risk for activity intolerance will decrease 03/03/2024 0203 by Carlene Coria, RN Outcome: Progressing 03/02/2024 2004 by Carlene Coria, RN Outcome: Progressing   Problem: Nutrition: Goal: Adequate nutrition will be maintained 03/03/2024 0203 by Carlene Coria, RN Outcome: Progressing 03/02/2024 2004 by Carlene Coria, RN Outcome: Progressing   Problem: Coping: Goal: Level of anxiety will decrease 03/03/2024  0203 by Carlene Coria, RN Outcome: Progressing 03/02/2024 2004 by Carlene Coria, RN Outcome: Progressing   Problem: Elimination: Goal: Will not experience complications related to bowel motility 03/03/2024 0203 by Carlene Coria, RN Outcome: Progressing 03/02/2024 2004 by Carlene Coria, RN Outcome: Progressing Goal: Will not experience complications related to urinary retention 03/03/2024 0203 by Carlene Coria, RN Outcome: Progressing 03/02/2024 2004 by Carlene Coria, RN Outcome: Progressing   Problem: Pain Managment: Goal: General experience of comfort will improve and/or be controlled 03/03/2024 0203 by Carlene Coria, RN Outcome: Progressing 03/02/2024 2004 by Carlene Coria, RN Outcome: Progressing   Problem: Safety: Goal: Ability to remain free from injury will improve 03/03/2024 0203 by Carlene Coria, RN Outcome: Progressing 03/02/2024 2004 by Carlene Coria, RN Outcome: Progressing   Problem: Skin Integrity: Goal: Risk for impaired skin integrity will decrease 03/03/2024 0203 by Carlene Coria, RN Outcome: Progressing 03/02/2024 2004 by Carlene Coria, RN Outcome: Progressing   Problem: Education: Goal: Knowledge of the prescribed therapeutic regimen will improve 03/03/2024 0203 by Carlene Coria, RN Outcome: Progressing 03/02/2024 2004 by Carlene Coria, RN Outcome: Progressing   Problem: Clinical Measurements: Goal: Usual level of consciousness will be regained or maintained. 03/03/2024 0203 by Carlene Coria, RN Outcome: Progressing 03/02/2024 2004 by Carlene Coria, RN Outcome: Progressing Goal: Neurologic status will improve 03/03/2024 0203 by Carlene Coria, RN Outcome: Progressing 03/02/2024 2004 by Carlene Coria, RN Outcome: Progressing Goal: Ability to maintain intracranial pressure will improve 03/03/2024 0203 by Carlene Coria, RN Outcome: Progressing 03/02/2024 2004 by Carlene Coria,  RN Outcome: Progressing   Problem: Skin Integrity: Goal: Demonstration of wound healing without infection will improve 03/03/2024 0203 by Carlene Coria, RN Outcome: Progressing 03/02/2024 2004 by Carlene Coria, RN Outcome: Progressing

## 2024-03-03 NOTE — Progress Notes (Addendum)
 Pt with orders to discharge to CIR. Pt PIVC removed and pt stable . Pt transported via wheelchair with all belongings with SWOT nurse. Staples removed per order and site is clean,dry and intact. Report given to RN.

## 2024-03-03 NOTE — Plan of Care (Signed)
 Problem: Education: Goal: Knowledge of General Education information will improve Description: Including pain rating scale, medication(s)/side effects and non-pharmacologic comfort measures 03/03/2024 0320 by Carlene Coria, RN Outcome: Progressing 03/03/2024 0203 by Carlene Coria, RN Outcome: Progressing 03/02/2024 2004 by Carlene Coria, RN Outcome: Progressing   Problem: Health Behavior/Discharge Planning: Goal: Ability to manage health-related needs will improve 03/03/2024 0320 by Carlene Coria, RN Outcome: Progressing 03/03/2024 0203 by Carlene Coria, RN Outcome: Progressing 03/02/2024 2004 by Carlene Coria, RN Outcome: Progressing   Problem: Clinical Measurements: Goal: Ability to maintain clinical measurements within normal limits will improve 03/03/2024 0320 by Carlene Coria, RN Outcome: Progressing 03/03/2024 0203 by Carlene Coria, RN Outcome: Progressing 03/02/2024 2004 by Carlene Coria, RN Outcome: Progressing Goal: Will remain free from infection 03/03/2024 0320 by Carlene Coria, RN Outcome: Progressing 03/03/2024 0203 by Carlene Coria, RN Outcome: Progressing 03/02/2024 2004 by Carlene Coria, RN Outcome: Progressing Goal: Diagnostic test results will improve 03/03/2024 0320 by Carlene Coria, RN Outcome: Progressing 03/03/2024 0203 by Carlene Coria, RN Outcome: Progressing 03/02/2024 2004 by Carlene Coria, RN Outcome: Progressing Goal: Respiratory complications will improve 03/03/2024 0320 by Carlene Coria, RN Outcome: Progressing 03/03/2024 0203 by Carlene Coria, RN Outcome: Progressing 03/02/2024 2004 by Carlene Coria, RN Outcome: Progressing Goal: Cardiovascular complication will be avoided 03/03/2024 0320 by Carlene Coria, RN Outcome: Progressing 03/03/2024 0203 by Carlene Coria, RN Outcome: Progressing 03/02/2024 2004 by Carlene Coria, RN Outcome: Progressing   Problem:  Activity: Goal: Risk for activity intolerance will decrease 03/03/2024 0320 by Carlene Coria, RN Outcome: Progressing 03/03/2024 0203 by Carlene Coria, RN Outcome: Progressing 03/02/2024 2004 by Carlene Coria, RN Outcome: Progressing   Problem: Nutrition: Goal: Adequate nutrition will be maintained 03/03/2024 0320 by Carlene Coria, RN Outcome: Progressing 03/03/2024 0203 by Carlene Coria, RN Outcome: Progressing 03/02/2024 2004 by Carlene Coria, RN Outcome: Progressing   Problem: Coping: Goal: Level of anxiety will decrease 03/03/2024 0320 by Carlene Coria, RN Outcome: Progressing 03/03/2024 0203 by Carlene Coria, RN Outcome: Progressing 03/02/2024 2004 by Carlene Coria, RN Outcome: Progressing   Problem: Elimination: Goal: Will not experience complications related to bowel motility 03/03/2024 0320 by Carlene Coria, RN Outcome: Progressing 03/03/2024 0203 by Carlene Coria, RN Outcome: Progressing 03/02/2024 2004 by Carlene Coria, RN Outcome: Progressing Goal: Will not experience complications related to urinary retention 03/03/2024 0320 by Carlene Coria, RN Outcome: Progressing 03/03/2024 0203 by Carlene Coria, RN Outcome: Progressing 03/02/2024 2004 by Carlene Coria, RN Outcome: Progressing   Problem: Pain Managment: Goal: General experience of comfort will improve and/or be controlled 03/03/2024 0320 by Carlene Coria, RN Outcome: Progressing 03/03/2024 0203 by Carlene Coria, RN Outcome: Progressing 03/02/2024 2004 by Carlene Coria, RN Outcome: Progressing   Problem: Safety: Goal: Ability to remain free from injury will improve 03/03/2024 0320 by Carlene Coria, RN Outcome: Progressing 03/03/2024 0203 by Carlene Coria, RN Outcome: Progressing 03/02/2024 2004 by Carlene Coria, RN Outcome: Progressing   Problem: Skin Integrity: Goal: Risk for impaired skin integrity will decrease 03/03/2024 0320  by Carlene Coria, RN Outcome: Progressing 03/03/2024 0203 by Carlene Coria, RN Outcome: Progressing 03/02/2024 2004 by Carlene Coria, RN Outcome: Progressing   Problem: Education: Goal: Knowledge of the prescribed therapeutic regimen will improve 03/03/2024 0320 by Carlene Coria, RN Outcome: Progressing 03/03/2024 0203 by  Carlene Coria, RN Outcome: Progressing 03/02/2024 2004 by Carlene Coria, RN Outcome: Progressing   Problem: Clinical Measurements: Goal: Usual level of consciousness will be regained or maintained. 03/03/2024 0320 by Carlene Coria, RN Outcome: Progressing 03/03/2024 0203 by Carlene Coria, RN Outcome: Progressing 03/02/2024 2004 by Carlene Coria, RN Outcome: Progressing Goal: Neurologic status will improve 03/03/2024 0320 by Carlene Coria, RN Outcome: Progressing 03/03/2024 0203 by Carlene Coria, RN Outcome: Progressing 03/02/2024 2004 by Carlene Coria, RN Outcome: Progressing Goal: Ability to maintain intracranial pressure will improve 03/03/2024 0320 by Carlene Coria, RN Outcome: Progressing 03/03/2024 0203 by Carlene Coria, RN Outcome: Progressing 03/02/2024 2004 by Carlene Coria, RN Outcome: Progressing   Problem: Skin Integrity: Goal: Demonstration of wound healing without infection will improve 03/03/2024 0320 by Carlene Coria, RN Outcome: Progressing 03/03/2024 0203 by Carlene Coria, RN Outcome: Progressing 03/02/2024 2004 by Carlene Coria, RN Outcome: Progressing

## 2024-03-03 NOTE — H&P (Signed)
 Physical Medicine and Rehabilitation Admission H&P    Chief Complaint  Patient presents with   Functional deficits.    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 He was found to have massive left SDH with sings of septations and small subacute right parafalcine SDH with midline shift.  He was evaluated by Dr. Yetta Barre and underwent left fronto-temporoparietal crani for evacuation of large chronic LDH the same day. He was placed on decadron and on Keppra for seizure prophylaxis and serial CT head showed increaseing post op pneumocephalus with residual L-SDH decreased to 6-8 mm from 12 mm and resolution of medline shift. Decadron d/c this am and HA reported to be resolving.  Therapy has been working with patient who is noted to have balance deficits with difficulty WB thru RLE, decrease in coordination on the right and noted to have shuffling gait tendency to drift left to right. He was independent and working PTA.  CIR recommended due to functional decline.     Review of Systems  Constitutional:  Negative for chills and fever.  HENT:  Negative for hearing loss.   Eyes:  Negative for blurred vision and double vision.  Respiratory:  Negative for cough and shortness of breath.   Cardiovascular:  Negative for chest pain and leg swelling.  Gastrointestinal:  Negative for constipation and heartburn.  Genitourinary:  Negative for dysuria.  Musculoskeletal:  Negative for back pain and myalgias.  Neurological:  Positive for dizziness (with certain movements) and headaches (almost gone--behind his eye after activity).  Psychiatric/Behavioral:  Negative for memory loss. The patient is not nervous/anxious and does not have insomnia.      Past Medical History:  Diagnosis Date    ADD (attention deficit disorder)    Allergic rhinitis, cause unspecified    Chronic kidney disease    Acute renal failure   Hypertension    Stroke [facility])     Past Surgical History:  Procedure Laterality Date   CRANIOTOMY Left 02/24/2024   Procedure: CRANIOTOMY HEMATOMA EVACUATION SUBDURAL;  Surgeon: Arman Bogus, MD;  Location: [facility] OR;  Service: Neurosurgery;  Laterality: Left;   elbow surgury  1969   left elbow   INGUINAL HERNIA REPAIR  2003   left   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/2023   Procedure: Shunt Placment - right occipital;  Surgeon: Julio Sicks, MD;  Location: [facility] OR;  Service: Neurosurgery;  Laterality: Right;    Family History  Problem Relation Age of Onset   Hypertension Other    Alcohol abuse Other    Hypertension Other    Kidney disease Other    Cancer Other    Alcohol abuse Other    Arthritis Other    Stroke Other    Hypertension Other    Cancer Other     Social History:  Married. Lives with family and Independent PTA. He  reports that he has quit smoking. He has never used smokeless tobacco. He reports current alcohol use. He reports that he does not use drugs.   Allergies  Allergen Reactions   Erythromycin Base Hives    Medications Prior to Admission  Medication Sig Dispense Refill   amLODipine (NORVASC) 5 MG tablet Take 5 mg by mouth daily.     aspirin EC 81 MG tablet Take 1  tablet (81 mg total) by mouth daily. Swallow whole. 30 tablet 12   atomoxetine (STRATTERA) 80 MG capsule Take 1 capsule (80 mg total) by mouth daily. 30 capsule 5   buPROPion (WELLBUTRIN XL) 300 MG 24 hr tablet Take 1 tablet (300 mg total) by mouth daily. 30 tablet 5   Cholecalciferol (VITAMIN D3) 50 MCG (2000 UT) CAPS Take 2,000 Units by mouth daily.     cyanocobalamin (VITAMIN B12) 1000 MCG tablet Take 1,000 mcg by mouth daily.     omeprazole (PRILOSEC OTC) 20 MG tablet Take 20 mg by mouth daily.     rosuvastatin (CRESTOR) 10 MG tablet Take 1 tablet (10 mg total)  by mouth daily. 30 tablet 2   tamsulosin (FLOMAX) 0.4 MG CAPS capsule Take 0.4 mg by mouth at bedtime.     valsartan-hydrochlorothiazide (DIOVAN-HCT) 80-12.5 MG tablet Take 1 tablet by mouth daily.       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  Functional Status:  Mobility: Bed Mobility Overal bed mobility: Modified Independent Bed Mobility: Supine to Sit Supine to sit: Supervision General bed mobility comments: OOB in chair Transfers Overall transfer level: Needs assistance Equipment used: Rolling walker (2 wheels) Transfers: Sit to/from Stand Sit to Stand: Contact guard assist General transfer comment: Pt self cueing for hand placement Ambulation/Gait Ambulation/Gait assistance: Min assist Gait Distance (Feet): 60 Feet (60", 60") Assistive device: Rolling walker (2 wheels), None (hall railing) Gait Pattern/deviations: Step-through pattern, Decreased stride length, Drifts right/left, Decreased dorsiflexion - right General Gait Details: Gait training with RW vs hall railing vs no hand support. Verbal cues for walker proximity, obstacle negotation, and R heel strike at initial contact. Pt with increased shuffling gait and decreased gait speed with no AD in comparison to with external support and requiring up to minA. Gait velocity: 1.22  ft/s Gait velocity interpretation: <1.31 ft/sec, indicative of household ambulator    ADL: ADL Overall ADL's : Needs assistance/impaired Eating/Feeding: Moderate assistance, Sitting Grooming: Wash/dry face, Oral care, Contact guard assist, Standing Grooming Details (indicate cue type and reason): completed in standing at sink Upper Body Bathing: Moderate assistance, Sitting Lower Body Bathing: Maximal assistance, Sitting/lateral leans Upper Body Dressing : Maximal assistance, Sitting Lower Body Dressing: Contact guard assist, Sit to/from stand Lower Body Dressing Details (indicate cue type and reason): donned underwear and was able to pull up underwear while standing Toilet Transfer: Contact guard assist, Ambulation, Regular Toilet Toilet Transfer Details (indicate cue type and reason): pt ambulated to regular toilet to void in standing Toileting- Clothing Manipulation and Hygiene: Contact guard assist, Minimal assistance Toileting - Clothing Manipulation Details (indicate cue type and reason): CGA maintained in standing, required min assist to hold gown up over waist to void Functional mobility during ADLs: Contact guard assist, Rolling walker (2 wheels) General ADL Comments: completed at sink  Cognition: Cognition Orientation Level: Oriented X4 Cognition Arousal: Alert Behavior During Therapy: WFL for tasks assessed/performed  Physical Exam: Blood pressure 136/89, pulse 90,  temperature 97.9 F (36.6 C), temperature source Oral, resp. rate 19, height 6' 0.01" (1.829 m), weight 107.1 kg, SpO2 90%. Physical Exam Vitals and nursing note reviewed.  Constitutional:      Appearance: Normal appearance.  HENT:     Head:     Comments: Left crani incision    Nose: Nose normal.  Eyes:     Extraocular Movements: Extraocular movements intact.     Conjunctiva/sclera: Conjunctivae normal.  Cardiovascular:     Rate and Rhythm: Normal rate and regular rhythm.     Heart sounds: No murmur  heard. Pulmonary:     Effort: Pulmonary effort is normal. No respiratory distress.  Abdominal:     General: Bowel sounds are normal. There is no distension.     Palpations: Abdomen is soft.     Tenderness: There is no abdominal tenderness.  Musculoskeletal:        General: No swelling or tenderness. Normal range of motion.     Cervical back: Normal range of motion.  Skin:    General: Skin is warm.     Comments: Crani incision CDI  Neurological:     Mental Status: He is alert and oriented to person, place, and time.     Comments: Speech clear and able to follow simple motor commands without difficulty. Oriented to person, hospital, month/year. Impaired insight and awareness. Delays in higher level processing. Mild STM deficits. CN exam non-focal. MMT: LUE 5/5. RUE 4 to 4+/5. LLE 5/5. RLE 4+/5. Sensory exam normal for light touch and pain in all 4 limbs. No limb ataxia or cerebellar signs. No abnormal tone appreciated.  DTR's 2+ in UE's and 3+ LE's with 1-2 beats of clonus BLE.   Psychiatric:     Comments: Pt is slightly impulsive. Generally pleasant. Occasionally is emotional      No results found for this or any previous visit (from the past 48 hours). No results found.    Blood pressure 136/89, pulse 90, temperature 97.9 F (36.6 C), temperature source Oral, resp. rate 19, height 6' 0.01" (1.829 m), weight 107.1 kg, SpO2 90%.  Medical Problem List and Plan: 1. Functional deficits secondary to large left SDH s/p craniotomy 02/24/24  -patient may  shower  -ELOS/Goals: 7-10 days, supervision to mod I goals with PT, OT, SLP 2.  Antithrombotics: -DVT/anticoagulation:  Mechanical: Sequential compression devices, below knee Bilateral lower extremities  -antiplatelet therapy: N/A secondary to SDH--no injuries/fall per reports.  3. Pain Management: Tylenol prn with hydrocodone prn severe pain--->last used 04/02.  4. Mood/Behavior/Sleep: LCSW to follow for evaluation and support.    -antipsychotic agents: N/A  --insomnia: Reports sleeping well.  Melatonin prn.   -check sleep chart at least for tonight  -observe for mood lability 5. Neuropsych/cognition: This patient is capable of making decisions on his own behalf. 6. Skin/Wound Care: Routine pressure relief measures.  7. Fluids/Electrolytes/Nutrition: Monitor I/O. No post op labs noted--will check in am.  8. Hx Hydrocephalus s/p VPS/Seizure prophylaxis: On Keppra BID 9. HTN: Monitor BP TID--on hydrochlorothiazide/irbesartan   -DBP borderline today 10. GERD: Continue Protonix 11. BPH?: Was on Flomax PTA. -monitor voiding patterns  12. Hx DDD/Panic attacks: On Wellbutrin and  13. ADD: Reggie Pile.     Jacquelynn Cree, PA-C 03/03/2024

## 2024-03-03 NOTE — Plan of Care (Signed)
  Problem: RH PAIN MANAGEMENT Goal: RH STG PAIN MANAGED AT OR BELOW PT'S PAIN GOAL Description: <4 w/ prns Outcome: Progressing

## 2024-03-03 NOTE — H&P (Signed)
 Physical Medicine and Rehabilitation Admission H&P        Chief Complaint  Patient presents with   Functional deficits.      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 He was found to have massive left SDH with sings of septations and small subacute right parafalcine SDH with midline shift.  He was evaluated by Dr. Yetta Barre and underwent left fronto-temporoparietal crani for evacuation of large chronic LDH the same day. He was placed on decadron and on Keppra for seizure prophylaxis and serial CT head showed increaseing post op pneumocephalus with residual L-SDH decreased to 6-8 mm from 12 mm and resolution of medline shift. Decadron d/c this am and HA reported to be resolving.  Therapy has been working with patient who is noted to have balance deficits with difficulty WB thru RLE, decrease in coordination on the right and noted to have shuffling gait tendency to drift left to right. He was independent and working PTA.  CIR recommended due to functional decline.      Review of Systems  Constitutional:  Negative for chills and fever.  HENT:  Negative for hearing loss.   Eyes:  Negative for blurred vision and double vision.  Respiratory:  Negative for cough and shortness of breath.   Cardiovascular:  Negative for chest pain and leg swelling.  Gastrointestinal:  Negative for constipation and heartburn.  Genitourinary:  Negative for dysuria.  Musculoskeletal:  Negative for back pain and myalgias.  Neurological:  Positive for dizziness (with certain movements) and headaches (almost gone--behind his eye after activity).  Psychiatric/Behavioral:  Negative for memory loss. The patient is not nervous/anxious and does not have insomnia.             Past Medical History:   Diagnosis Date   ADD (attention deficit disorder)     Allergic rhinitis, cause unspecified     Chronic kidney disease      Acute renal failure   Hypertension     Stroke [facility])                 Past Surgical History:  Procedure Laterality Date   CRANIOTOMY Left 02/24/2024    Procedure: CRANIOTOMY HEMATOMA EVACUATION SUBDURAL;  Surgeon: Arman Bogus, MD;  Location: [facility] OR;  Service: Neurosurgery;  Laterality: Left;   elbow surgury   1969    left elbow   INGUINAL HERNIA REPAIR   2003    left   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/2023    Procedure: Shunt Placment - right occipital;  Surgeon: Julio Sicks, MD;  Location: [facility] OR;  Service: Neurosurgery;  Laterality: Right;               Family History  Problem Relation Age of Onset   Hypertension Other     Alcohol abuse Other     Hypertension Other     Kidney disease Other     Cancer Other     Alcohol abuse Other     Arthritis Other     Stroke Other     Hypertension Other     Cancer Other            Social History:  Married. Lives with family and Independent PTA. He  reports that he has quit smoking. He has never used  smokeless tobacco. He reports current alcohol use. He reports that he does not use drugs.     Allergies      Allergies  Allergen Reactions   Erythromycin Base Hives              Medications Prior to Admission  Medication Sig Dispense Refill   amLODipine (NORVASC) 5 MG tablet Take 5 mg by mouth daily.       aspirin EC 81 MG tablet Take 1 tablet (81 mg total) by mouth daily. Swallow whole. 30 tablet 12   atomoxetine (STRATTERA) 80 MG capsule Take 1 capsule (80 mg total) by mouth daily. 30 capsule 5   buPROPion (WELLBUTRIN XL) 300 MG 24 hr tablet Take 1 tablet (300 mg total) by mouth daily. 30 tablet 5   Cholecalciferol (VITAMIN D3) 50 MCG (2000 UT) CAPS Take 2,000 Units by mouth daily.       cyanocobalamin (VITAMIN B12) 1000 MCG tablet Take 1,000 mcg by mouth daily.       omeprazole (PRILOSEC  OTC) 20 MG tablet Take 20 mg by mouth daily.       rosuvastatin (CRESTOR) 10 MG tablet Take 1 tablet (10 mg total) by mouth daily. 30 tablet 2   tamsulosin (FLOMAX) 0.4 MG CAPS capsule Take 0.4 mg by mouth at bedtime.       valsartan-hydrochlorothiazide (DIOVAN-HCT) 80-12.5 MG tablet Take 1 tablet by mouth daily.            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   Functional Status:  Mobility: Bed Mobility Overal bed mobility: Modified Independent Bed Mobility: Supine to Sit Supine to sit: Supervision General bed mobility comments: OOB in chair Transfers Overall transfer level: Needs assistance Equipment used: Rolling walker (2 wheels) Transfers: Sit to/from Stand Sit to Stand: Contact guard assist General transfer comment: Pt self cueing for hand placement Ambulation/Gait Ambulation/Gait assistance: Min assist Gait Distance (Feet): 60 Feet (60", 60") Assistive device: Rolling walker (2 wheels), None (hall railing) Gait Pattern/deviations: Step-through pattern, Decreased stride length, Drifts right/left, Decreased dorsiflexion - right General Gait Details: Gait training with RW vs hall railing vs no hand support. Verbal cues for walker proximity, obstacle negotation, and R heel strike at initial contact. Pt with increased shuffling  gait and decreased gait speed with no AD in comparison to with external support and requiring up to minA. Gait velocity: 1.22 ft/s Gait velocity interpretation: <1.31 ft/sec, indicative of household ambulator   ADL: ADL Overall ADL's : Needs assistance/impaired Eating/Feeding: Moderate assistance, Sitting Grooming: Wash/dry face, Oral care, Contact guard assist, Standing Grooming Details (indicate cue type and reason): completed in standing at sink Upper Body Bathing: Moderate assistance, Sitting Lower Body Bathing: Maximal assistance, Sitting/lateral leans Upper Body Dressing : Maximal assistance, Sitting Lower Body Dressing: Contact guard assist, Sit to/from stand Lower Body Dressing Details (indicate cue type and reason): donned underwear and was able to pull up underwear while standing Toilet Transfer: Contact guard assist, Ambulation, Regular  Teacher, adult education Details (indicate cue type and reason): pt ambulated to regular toilet to void in standing Toileting- Clothing Manipulation and Hygiene: Contact guard assist, Minimal assistance Toileting - Clothing Manipulation Details (indicate cue type and reason): CGA maintained in standing, required min assist to hold gown up over waist to void Functional mobility during ADLs: Contact guard assist, Rolling walker (2 wheels) General ADL Comments: completed at sink   Cognition: Cognition Orientation Level: Oriented X4 Cognition Arousal: Alert Behavior During Therapy: WFL for tasks assessed/performed   Physical Exam: Blood pressure 136/89, pulse 90, temperature 97.9 F (36.6 C), temperature source Oral, resp. rate 19, height 6' 0.01" (1.829 m), weight 107.1 kg, SpO2 90%. Physical Exam Vitals and nursing note reviewed.  Constitutional:      Appearance: Normal appearance.  HENT:     Head:     Comments: Left crani incision    Nose: Nose normal.  Eyes:     Extraocular Movements: Extraocular movements intact.      Conjunctiva/sclera: Conjunctivae normal.  Cardiovascular:     Rate and Rhythm: Normal rate and regular rhythm.     Heart sounds: No murmur heard. Pulmonary:     Effort: Pulmonary effort is normal. No respiratory distress.  Abdominal:     General: Bowel sounds are normal. There is no distension.     Palpations: Abdomen is soft.     Tenderness: There is no abdominal tenderness.  Musculoskeletal:        General: No swelling or tenderness. Normal range of motion.     Cervical back: Normal range of motion.  Skin:    General: Skin is warm.     Comments: Crani incision CDI  Neurological:     Mental Status: He is alert and oriented to person, place, and time.     Comments: Speech clear and able to follow simple motor commands without difficulty. Oriented to person, hospital, month/year. Impaired insight and awareness. Delays in higher level processing. Mild STM deficits. CN exam non-focal. MMT: LUE 5/5. RUE 4 to 4+/5. LLE 5/5. RLE 4+/5. Sensory exam normal for light touch and pain in all 4 limbs. No limb ataxia or cerebellar signs. No abnormal tone appreciated.  DTR's 2+ in UE's and 3+ LE's with 1-2 beats of clonus BLE.   Psychiatric:     Comments: Pt is slightly impulsive. Generally pleasant. Occasionally is emotional         Lab Results Last 48 Hours  No results found for this or any previous visit (from the past 48 hours).   Imaging Results (Last 48 hours)  No results found.         Blood pressure 136/89, pulse 90, temperature 97.9 F (36.6 C), temperature source Oral, resp. rate 19, height 6' 0.01" (1.829 m), weight 107.1 kg, SpO2 90%.   Medical Problem List and Plan: 1. Functional deficits secondary to large left SDH s/p craniotomy 02/24/24  -hx of VPS 03/13/23             -patient may  shower             -ELOS/Goals: 7-10 days, supervision to mod I goals with PT, OT, SLP 2.  Antithrombotics: -DVT/anticoagulation:  Mechanical: Sequential compression devices, below knee Bilateral  lower extremities             -antiplatelet therapy: N/A secondary to SDH--no injuries/fall per reports.  3. Pain Management: Tylenol prn with hydrocodone prn severe pain--->last used 04/02.  4. Mood/Behavior/Sleep: LCSW to follow for evaluation  and support.   -has hx of DDD/Panic attacks, on wellbutrin XL 300mg  daily--resume             -antipsychotic agents: N/A             --insomnia: Reports sleeping well.  Melatonin prn.              -check sleep chart at least for tonight             -observe for mood lability 5. Neuropsych/cognition: This patient is capable of making decisions on his own behalf.  -has hx of ADD--continue home straterra 6. Skin/Wound Care: Routine pressure relief measures.  7. Fluids/Electrolytes/Nutrition: Monitor I/O. No post op labs noted--will check in am.  8. Hx Hydrocephalus s/p VPS/Seizure prophylaxis: On Keppra BID 9. HTN: Monitor BP TID--on hydrochlorothiazide/irbesartan              -DBP borderline today 10. GERD: Continue Protonix 11. BPH?: Was on Flomax PTA.  -resume flomax -monitor voiding patterns  12. Seizure prophylaxis-  -keppra 500mg  bid.         Jacquelynn Cree, PA-C 03/03/2024  I have personally performed a face to face diagnostic evaluation of this patient and formulated the key components of the plan.  Additionally, I have personally reviewed laboratory data, imaging studies, as well as relevant notes and concur with the physician assistant's documentation above.  The patient's status has not changed from the original H&P.  Any changes in documentation from the acute care chart have been noted above.  Ranelle Oyster, MD, [place] [facility]

## 2024-03-03 NOTE — Progress Notes (Signed)
 Genice Rouge, MD  Physician Physical Medicine and Rehabilitation   Consult Note    Signed   Date of Service: 02/28/2024  1:38 PM  Related encounter: ED to Hosp-Admission (Current) from 02/24/2024 in [place] [facility]   Signed     Expand All Collapse All           Physical Medicine and Rehabilitation Consult Reason for Consult:Acute CIR- Rehab Referring Physician: Dr Jordan Likes     HPI: Micheal Ayers is a 63 y.o. 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 Per ED note, pt was having slurred speech, and R facial droop and possible weakness- pt's wife said Sx's were more nonspecific.    Underwent head CT when brought to ED and found to have a L large SDH that was convex and 5cm- with 1.5-2cm midline shift.  Pt was taken to OR emergently for Craniotomy- L frontotemporoparietal Crani-   Post op- pt has some pneumocephalus- but will resolve over the next 2 or so weeks per NSU.  Pt also reports that is crying over "nothing" and doesn't understand why Also per therapy notes, although was HHA min A short distance, he has significant R inattention which is impacting his ability to participate in therapy.    Per pt he's on Wellbutrin for ADHD when [employer] was taken off market.  Doesn't want to stop Wellbutrin, - is on keppra for seizure prophylaxis.    Pt reports HA- has been having one, but hasn't even asked for Tylenol for it- although it's running 2-3/10 when laying down, spikes higher up to 7-8/10 with looking upwards, it's more aggravating than anything.    Wife feels pt still somewhat confused and it confusing Sx's from VP shunt with current  Sx's and thought Dr Jordan Likes "did something wrong".          Social Hx: Pt works full time at Press [employer] at [employer]- and has 2 adult children- 1 who's 25 and lives at home- wife, who is shorter than pt, is retired and does Air traffic controller.  Pt is former smoker   .      Review of Systems  Constitutional:  Positive for malaise/fatigue.  HENT: Negative.    Eyes: Negative.  Negative for blurred vision and double vision.  Respiratory: Negative.  Negative for cough and shortness of breath.   Cardiovascular: Negative.  Negative for chest pain and leg swelling.  Gastrointestinal:  Negative for abdominal pain, constipation, diarrhea, nausea and vomiting.  Genitourinary: Negative.  Negative for dysuria, frequency and urgency.  Musculoskeletal: Negative.  Negative for back pain, myalgias and neck pain.  Skin: Negative.   Neurological:  Positive for weakness and headaches.  Endo/Heme/Allergies: Negative.   Psychiatric/Behavioral:  Positive for depression and memory loss.        Crying for "no reason"  All other systems reviewed and are negative.       Past Medical History:  Diagnosis Date   ADD (attention deficit disorder)     Allergic rhinitis, cause unspecified     Chronic kidney disease      Acute renal failure   Hypertension  Stroke [facility])               Past Surgical History:  Procedure Laterality Date   CRANIOTOMY Left 02/24/2024    Procedure: CRANIOTOMY HEMATOMA EVACUATION SUBDURAL;  Surgeon: Arman Bogus, MD;  Location: [facility] OR;  Service: Neurosurgery;  Laterality: Left;   elbow surgury   1969    left elbow   INGUINAL HERNIA REPAIR   2003    left   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/2023    Procedure: Shunt Placment - right occipital;  Surgeon: Julio Sicks, MD;  Location: [facility] OR;  Service: Neurosurgery;  Laterality: Right;             Family History  Problem Relation Age of Onset   Hypertension Other     Alcohol abuse Other     Hypertension Other      Kidney disease Other     Cancer Other     Alcohol abuse Other     Arthritis Other     Stroke Other     Hypertension Other     Cancer Other          Social History:  reports that he has quit smoking. He has never used smokeless tobacco. He reports current alcohol use. He reports that he does not use drugs. Allergies:  Allergies      Allergies  Allergen Reactions   Erythromycin Base Hives            Medications Prior to Admission  Medication Sig Dispense Refill   amLODipine (NORVASC) 5 MG tablet Take 5 mg by mouth daily.       aspirin EC 81 MG tablet Take 1 tablet (81 mg total) by mouth daily. Swallow whole. 30 tablet 12   atomoxetine (STRATTERA) 80 MG capsule Take 1 capsule (80 mg total) by mouth daily. 30 capsule 5   buPROPion (WELLBUTRIN XL) 300 MG 24 hr tablet Take 1 tablet (300 mg total) by mouth daily. 30 tablet 5   Cholecalciferol (VITAMIN D3) 50 MCG (2000 UT) CAPS Take 2,000 Units by mouth daily.       cyanocobalamin (VITAMIN B12) 1000 MCG tablet Take 1,000 mcg by mouth daily.       omeprazole (PRILOSEC OTC) 20 MG tablet Take 20 mg by mouth daily.       rosuvastatin (CRESTOR) 10 MG tablet Take 1 tablet (10 mg total) by mouth daily. 30 tablet 2   tamsulosin (FLOMAX) 0.4 MG CAPS capsule Take 0.4 mg by mouth at bedtime.       valsartan-hydrochlorothiazide (DIOVAN-HCT) 80-12.5 MG tablet Take 1 tablet by mouth daily.              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 Functional Status:  Mobility: Bed Mobility Overal bed mobility: Needs Assistance Bed Mobility: Supine to Sit Supine to sit: Contact guard General bed mobility comments: pt able to perform supine to sit and required minor assistance to lift trunk, used therapist to  aid in pulling self upward Transfers Overall transfer level: Needs assistance Equipment used: Rolling walker (2 wheels) Transfers: Sit to/from Stand Sit to Stand: Contact guard assist General transfer comment: CGA provided for safety Ambulation/Gait Ambulation/Gait assistance: Min assist Gait Distance (Feet): 30 Feet Assistive device: None Gait Pattern/deviations: Step-through pattern, Decreased stride length, Drifts right/left General Gait Details: Pt requiring consistent minA throughout gait, drift towards R bumping into 2 objects despite verbal cueing, increased lateral sway, verbal cues for increased foot clearance. Chair follow utilized Gait velocity: decreased Gait velocity interpretation: <1.8 ft/sec, indicate of risk for recurrent falls   ADL: ADL Overall ADL's : Needs assistance/impaired Eating/Feeding: Moderate assistance, Sitting Grooming: Wash/dry face, Oral care, Contact guard assist, Standing Grooming Details (indicate cue type and reason): completed in standing at sink Upper Body Bathing: Moderate assistance, Sitting Lower Body Bathing: Maximal assistance, Sitting/lateral leans Upper Body Dressing : Maximal assistance, Sitting Lower Body Dressing: Contact guard assist, Sit to/from stand Lower Body Dressing Details (indicate cue type and reason): donned underwear and was able to pull up underwear while standing Toilet Transfer: Contact guard assist, Ambulation, Regular Toilet Toilet Transfer Details (indicate cue type and reason): pt ambulated  to regular toilet to void in standing Toileting- Clothing Manipulation and Hygiene: Contact guard assist, Minimal assistance Toileting - Clothing Manipulation Details (indicate cue type and reason): CGA maintained in standing, required min assist to hold gown up over waist to void Functional mobility during ADLs: Contact guard assist, Rolling walker (2 wheels) General ADL Comments: completed at sink   Cognition: Cognition Orientation Level: Oriented X4 Cognition Arousal: Alert Behavior During Therapy: [facility] for tasks assessed/performed   Blood pressure 112/85, pulse 84, temperature 97.7 F (36.5 C), temperature source Oral, resp. rate 14, height 6' 0.01" (1.829 m), weight 107.1 kg, SpO2 93%. Physical Exam Vitals and nursing note reviewed. Exam conducted with a chaperone present.  Constitutional:      Appearance: Normal appearance. He is obese.     Comments: Pt initially on BSC, but when entered room, back on bedside chair with wife at side, awake, alert, appropriate, slightly off on higher level cognition issues, NAD  HENT:     Head: Normocephalic.     Comments: L frontotemporoparietal crani- staples look good- no drainage seen Mild R facial droop Tongue very mild R tongue deviation    Nose: Nose normal. No congestion.     Mouth/Throat:     Mouth: Mucous membranes are dry.     Pharynx: Oropharynx is clear. No oropharyngeal exudate.  Eyes:     General:        Right eye: No discharge.        Left eye: No discharge.     Extraocular Movements: Extraocular movements intact.     Comments: No nystagmus B/L EOMI B/L Peripheral vision appears to be intact on specific testing  Cardiovascular:     Rate and Rhythm: Normal rate and regular rhythm.     Heart sounds: Normal heart sounds. No murmur heard.    No gallop.  Pulmonary:     Effort: Pulmonary effort is normal. No respiratory distress.     Breath sounds: Normal breath sounds. No wheezing, rhonchi or rales.  Abdominal:     General:  Bowel sounds are normal. There is no distension.     Palpations: Abdomen is soft.     Tenderness: There is no abdominal tenderness.  Musculoskeletal:     Cervical back: Neck supple. No tenderness.     Comments: LUE /5  in Deltoid, biceps, triceps, WE, grip and FA- was slightly delayed with exam RUE- 5-/5 in deltoid; 4+/5 in Biceps, triceps, WE 5-/5 And grip/FA 4+/5- significantly delayed with participating in exam-  LLE- 5/5 in HF, KE, KF, DF and PF RLE- HF 4+/5; KE/KF 4+ to 5-/5 and DF 4+/5 and PF 4/5- less than other muscles Delayed with entire exam  Skin:    General: Skin is warm and dry.     Comments: L crani -staples intact B/L forearm IV's   Neurological:     Comments: Pt's hoffman's RUE- not in LUE Few beats clonus B/L LE's Ue's 2+ B/L DTRs, however L patella and Achilles 3+ and RLE DTR's 4+ and crossing 3/3 naming, but delayed in responding [facility] was [facility]- Thursday April 2025 Says intact to light touch in all 4 extremities and face, however keeps moving LUE towards me when asked to use RUE- cannot touch on R side without cues, only L side.  Mild word substitutions noted in 4 different instances   Psychiatric:     Comments: Crying uncontrollably 5-6 different times and caught himself and was starting to cry 2 more times- obvious PBA         Lab Results Last 24 Hours  No results found for this or any previous visit (from the past 24 hours).    Imaging Results (Last 48 hours)  CT HEAD WO CONTRAST ( ) Result Date: 02/28/2024 CLINICAL DATA:  63 year old male code stroke presentation on 02/24/2024 with subdural hematoma. Postoperative day 4 status post craniotomy and evacuation. EXAM: CT HEAD WITHOUT CONTRAST TECHNIQUE: Contiguous axial images were obtained from the base of the skull through the vertex without intravenous contrast. RADIATION DOSE REDUCTION: This exam was performed according to the departmental dose-optimization program which includes automated  exposure control, adjustment of the mA and/or kV according to patient size and/or use of iterative reconstruction technique. COMPARISON:  Postoperative CT 02/26/2024 and earlier. FINDINGS: Brain: Moderate volume pneumocephalus which is greater in the right hemisphere now, with new mass effect on the anterior right frontal lobe similar to that on the left side (series 4, image 17). Postoperative subdural drain has been removed. At the coronal level of the basilar artery mostly hyperdense subdural hematoma has increased, biconvex and measures up to 12 mm in thickness versus 6-8 mm previously. Stable small volume contralateral right para falcine and tentorial subdural blood. No significant midline shift despite the findings. Asymmetry of the prominent lateral ventricles has not significantly changed and there is a chronic right posterior approach CSF shunt which is stable. Basilar cisterns are stable, partially effaced suprasellar cistern. Stable gray-white matter differentiation throughout the brain. Small chronic left cerebellar infarct. Vascular: No suspicious intracranial vascular hyperdensity. Skull: Stable. Recent left parietal craniotomy. Chronic right posterior calvarium burr hole. Sinuses/Orbits: Visualized paranasal sinuses and mastoids are stable and well aerated. Other: Postoperative changes to the scalp vertex. The scalp drain has been removed. Skin staples in place. Contralateral right posterior convexity chronic shunt reservoir and tubing appears stable. Visualized orbit soft tissues are within normal limits. IMPRESSION: 1. Increasing postoperative Pneumocephalus, now affecting both anterior frontal lobes (see series 4, image 17). 2. Subdural drain removed with interval increased residual Left Subdural Hematoma; 12 mm maximal thickness now vs 6-8 mm on 02/25/2022. Small volume para falcine SDH unchanged. 3. No significant midline shift. Stable basilar cisterns. Stable CSF shunt and ventriculomegaly.  Electronically Signed   By: Odessa Fleming M.D.   On: 02/28/2024 08:45  Assessment/Plan: Diagnosis: L Large SDH s/p frontotemporoparietal crani  Does the need for close, 24 hr/day medical supervision in concert with the patient's rehab needs make it unreasonable for this patient to be served in a less intensive setting? Yes Co-Morbidities requiring supervision/potential complications: Pseudobulbar affect; R inattention;  R sided hemiparesis; HTN, hx of CVA 2024 and VP shunt due to obstructive hydrocephalus, depression, ADHD Due to bladder management, bowel management, safety, skin/wound care, disease management, medication administration, pain management, and patient education, does the patient require 24 hr/day rehab nursing? Yes Does the patient require coordinated care of a physician, rehab nurse, therapy disciplines of PT, OT and SLP to address physical and functional deficits in the context of the above medical diagnosis(es)? Yes Addressing deficits in the following areas: balance, endurance, locomotion, strength, transferring, bowel/bladder control, bathing, dressing, feeding, grooming, toileting, and cognition Can the patient actively participate in an intensive therapy program of at least 3 hrs of therapy per day at least 5 days per week? Yes The potential for patient to make measurable gains while on inpatient rehab is good Anticipated functional outcomes upon discharge from inpatient rehab are modified independent and supervision  with PT, modified independent and supervision with OT, modified independent and supervision with SLP. Estimated rehab length of stay to reach the above functional goals is: 7-10 days Anticipated discharge destination: Home Overall Rehab/Functional Prognosis: good   RECOMMENDATIONS: This patient's condition is appropriate for continued rehabilitative care in the following setting: CIR Patient has agreed to participate in recommended program. Yes Note that  insurance prior authorization may be required for reimbursement for recommended care.   Comment:  Patient has Pseudobulbar affect (PBA)- there's only one medicine that is FDA approved for this, Nudexta, however hard to get- suggest Prozac 10-20 mg daily- along with Wellbutrin (takes more for ADHD than depression)  to treat PBA - and if that doesn't work (explained it doesn't work instantaneously) than could try [employer].  Pt has R side inattention causing difficulties even with cues telling where he is in space- I suggest therapies work on quality of gait due to inattention- as well as where he is in space- couldn't find that peripheral vision was impaired on direct exam.  Headaches- having pretty much daily since SDH- suggest tylenol for now- if continues daily- then would benefit from Topamax 25 mg BID to prevent HA's- pain is 2-3/10 at rest, but spikes to 7-8/10 with looking upward, however per pt not sharp/stabbing.  Mild upper level cognition issues noted- pt kept thinking his Large L SDH was due to Hydrocephalus form last year and that there might have "been a mistake with VP shunt"- disabused pt of this idea, but sounds like ICU doc also did so, and pt somewhat perseverative on this topic. Would strongly benefit from SLP once gets to CIR.  Will submit to admissions coordinator for Inpatient CIR admission.  Thank you for this consult- pt is ideal candidate for CIR due to balance and R hemiparesis, R inattention and higher level cognitive deficits.      Genice Rouge, MD 02/28/2024      I spent a total of 89   minutes on total care today- >50% coordination of care- due to  Pt chart reviewed- s/dw pt's wife for 15 minutes initially, then with pt interview- then exam as well as education on PBA and inattention to wife and pt- and then d/w nursing and admissions coordinator as well as documentation.

## 2024-03-03 NOTE — Discharge Summary (Cosign Needed Addendum)
 Physician Discharge Summary     Providing Compassionate, Quality Care - Together   Patient ID: Micheal Ayers MRN: [id number] DOB/AGE: July 30, 1961 63 y.o.  Admit date: 02/24/2024 Discharge date: 03/03/2024  Admission Diagnoses: SDH  Discharge Diagnoses:  Principal Problem:   Subdural hematoma [facility]) Active Problems:   S/P craniotomy   Discharged Condition: good  Hospital Course: Patient underwent a left frontotemporoparietal craniotomy for evacuation of large left chronic subdural hematoma by Dr. Yetta Barre on 02/24/2024. His postoperative course has been uncomplicated. He has worked with both physical and occupational therapies who feel the patient is ready for discharge to [facility] CIR. He is ambulating with the aid of a walker. He is tolerating a normal diet. He is not having any bowel or bladder dysfunction. His pain is well-controlled with oral pain medication. He is ready for discharge to CIR.   Consults: rehabilitation medicine  Significant Diagnostic Studies: radiology: CT HEAD WO CONTRAST ( ) Result Date: 02/28/2024 CLINICAL DATA:  63 year old male code stroke presentation on 02/24/2024 with subdural hematoma. Postoperative day 4 status post craniotomy and evacuation. EXAM: CT HEAD WITHOUT CONTRAST TECHNIQUE: Contiguous axial images were obtained from the base of the skull through the vertex without intravenous contrast. RADIATION DOSE REDUCTION: This exam was performed according to the departmental dose-optimization program which includes automated exposure control, adjustment of the mA and/or kV according to patient size and/or use of iterative reconstruction technique. COMPARISON:  Postoperative CT 02/26/2024 and earlier. FINDINGS: Brain: Moderate volume pneumocephalus which is greater in the right hemisphere now, with new mass effect on the anterior right frontal lobe similar to that on the left side (series 4, image 17). Postoperative subdural drain has been removed. At the  coronal level of the basilar artery mostly hyperdense subdural hematoma has increased, biconvex and measures up to 12 mm in thickness versus 6-8 mm previously. Stable small volume contralateral right para falcine and tentorial subdural blood. No significant midline shift despite the findings. Asymmetry of the prominent lateral ventricles has not significantly changed and there is a chronic right posterior approach CSF shunt which is stable. Basilar cisterns are stable, partially effaced suprasellar cistern. Stable gray-white matter differentiation throughout the brain. Small chronic left cerebellar infarct. Vascular: No suspicious intracranial vascular hyperdensity. Skull: Stable. Recent left parietal craniotomy. Chronic right posterior calvarium burr hole. Sinuses/Orbits: Visualized paranasal sinuses and mastoids are stable and well aerated. Other: Postoperative changes to the scalp vertex. The scalp drain has been removed. Skin staples in place. Contralateral right posterior convexity chronic shunt reservoir and tubing appears stable. Visualized orbit soft tissues are within normal limits. IMPRESSION: 1. Increasing postoperative Pneumocephalus, now affecting both anterior frontal lobes (see series 4, image 17). 2. Subdural drain removed with interval increased residual Left Subdural Hematoma; 12 mm maximal thickness now vs 6-8 mm on 02/25/2022. Small volume para falcine SDH unchanged. 3. No significant midline shift. Stable basilar cisterns. Stable CSF shunt and ventriculomegaly. Electronically Signed   By: Odessa Fleming M.D.   On: 02/28/2024 08:45   CT HEAD WO CONTRAST ( ) Result Date: 02/26/2024 CLINICAL DATA:  Subdural hematoma follow-up EXAM: CT HEAD WITHOUT CONTRAST TECHNIQUE: Contiguous axial images were obtained from the base of the skull through the vertex without intravenous contrast. RADIATION DOSE REDUCTION: This exam was performed according to the departmental dose-optimization program which includes  automated exposure control, adjustment of the mA and/or kV according to patient size and/or use of iterative reconstruction technique. COMPARISON:  02/25/2024 FINDINGS: Brain: Unchanged appearance of left convexity subdural hematoma  with drainage catheter within. Decreased left anterior pneumocephalus with mild mass effect on the left frontal lobe. There is unchanged posterior right para falcine blood products. Right posterior parietal approach shunt catheter terminating near the foramina of [place]. Unchanged size and configuration of the ventricles. Unchanged 10 mm of rightward midline shift. Vascular: No hyperdense vessel or unexpected vascular calcification. Skull: The visualized skull base, calvarium and extracranial soft tissues are normal. Sinuses/Orbits: No fluid levels or advanced mucosal thickening of the visualized paranasal sinuses. No mastoid or middle ear effusion. Normal orbits. Other: None. IMPRESSION: 1. Unchanged appearance of left convexity subdural hematoma with drainage catheter within. Decreased left anterior pneumocephalus with mild mass effect on the left frontal lobe. 2. Unchanged 10 mm of rightward midline shift. 3. Unchanged size and configuration of the ventricles. Electronically Signed   By: Deatra Robinson M.D.   On: 02/26/2024 01:16   CT HEAD WO CONTRAST Result Date: 02/25/2024 CLINICAL DATA:  Subdural hematoma. EXAM: CT HEAD WITHOUT CONTRAST TECHNIQUE: Contiguous axial images were obtained from the base of the skull through the vertex without intravenous contrast. RADIATION DOSE REDUCTION: This exam was performed according to the departmental dose-optimization program which includes automated exposure control, adjustment of the mA and/or kV according to patient size and/or use of iterative reconstruction technique. COMPARISON:  Head CT 02/24/2024 FINDINGS: Brain: Sequelae of interval left frontoparietal craniotomy and subdural hematoma evacuation are identified. Subdural and subgaleal  drains are in place. There is pneumocephalus, including prominent subdural gas over the left frontal convexity where the largest portion of the subdural hematoma was present on the preoperative study. This gas measures up to 4.3 cm in thickness on axial images compared to 4.9 cm thickness of the subdural hematoma in this location previously. There is persistent mass effect on the left frontal lobe and left lateral ventricle with mildly decreased rightward midline shift which now measures 10 mm (previously 14 mm). A residual subdural fluid collection more posteriorly over the left cerebral convexity contains largely new hyperdense blood products consistent with interval hemorrhage, with this hyperdense component measuring up to 1.6 cm in thickness over the left parietal convexity near the vertex. A small right parafalcine subdural hematoma with a Mata crit level is unchanged, measuring up to 5 mm in thickness. A right parietal approach ventriculostomy catheter is unchanged in position, terminating in the midline near the superior margin of the third ventricle. Mass effect on the left lateral and third ventricles has decreased with partial re-expansion. A small chronic left cerebellar infarct is again noted. No acute infarct or mass is identified. Vascular: No hyperdense vessel. Skull: Left-sided craniotomy with mild overlying scalp swelling and skin staples in place. Sinuses/Orbits: No significant inflammatory changes in the included portions of the paranasal sinuses. Clear mastoid air cells. Unremarkable orbits. Other: None. IMPRESSION: 1. Interval left frontoparietal craniotomy for subdural hematoma evacuation. Moderate volume pneumocephalus over the left frontal convexity and evidence of a small to moderate amount of interval subdural hemorrhage over the left parietal convexity. Overall decreased mass effect and decreased rightward midline shift, no 10 mm. 2. Unchanged small right parafalcine subdural hematoma.  Electronically Signed   By: Sebastian Ache M.D.   On: 02/25/2024 08:04   CT ANGIO HEAD NECK W WO CM W PERF (CODE STROKE) Result Date: 02/24/2024 CLINICAL DATA:  Subdural EXAM: CT ANGIOGRAPHY HEAD AND NECK CT PERFUSION BRAIN TECHNIQUE: Multidetector CT imaging of the head and neck was performed using the standard protocol during bolus administration of intravenous contrast. Multiplanar CT  image reconstructions and MIPs were obtained to evaluate the vascular anatomy. Carotid stenosis measurements (when applicable) are obtained utilizing NASCET criteria, using the distal internal carotid diameter as the denominator. Multiphase CT imaging of the brain was performed following IV bolus contrast injection. Subsequent parametric perfusion maps were calculated using RAPID software. RADIATION DOSE REDUCTION: This exam was performed according to the departmental dose-optimization program which includes automated exposure control, adjustment of the mA and/or kV according to patient size and/or use of iterative reconstruction technique. CONTRAST:  OMNIPAQUE IOHEXOL 350 MG/ML SOLN COMPARISON:  02/19/2023 FINDINGS: Aortic arch: Atheromatous plaque with 3 vessel branching. Right carotid system: Limited atheromatous change. Medial outpouching from the mid ICA, proud by 1-2 mm. No dissection flap or ulceration. No significant stenosis Left carotid system: Mild mixed density plaque centered at the bifurcation without stenosis or ulceration. Mild beading and dilatation of the distal ICA measuring up to 5.5 mm. Vertebral arteries: Subclavian and vertebral arteries are smoothly contoured and diffusely patent. Skeleton: No evidence of fracture or bone lesion. Generalized degenerative endplate and facet spurring. Other neck: No acute finding Upper chest: Clear apical lungs Review of the MIP images confirms the above findings CTA HEAD FINDINGS Anterior circulation: No significant stenosis, proximal occlusion, aneurysm, or vascular  malformation. Posterior circulation: Vertebral and basilar arteries are smoothly contoured and diffusely patent. No branch occlusion, beading, or aneurysm Venous sinuses: Unremarkable for arterial timing Anatomic variants: None significant CT Brain Perfusion Findings: Artifactual tagging primarily of the large left subdural hematoma. Some compressive perfusional changes may also be present in the high left cerebral cortex. IMPRESSION: 1. No emergent arterial finding. No vascular lesion seen underlying the subdural hemorrhage. 2. 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 Electronically Signed   By: Tiburcio Pea M.D.   On: 02/24/2024 10:17   CT HEAD CODE STROKE WO CONTRAST Result Date: 02/24/2024 CLINICAL DATA:  Code stroke.  Right facial droop and aphasia EXAM: CT HEAD WITHOUT CONTRAST TECHNIQUE: Contiguous axial images were obtained from the base of the skull through the vertex without intravenous contrast. RADIATION DOSE REDUCTION: This exam was performed according to the departmental dose-optimization program which includes automated exposure control, adjustment of the mA and/or kV according to patient size and/or use of iterative reconstruction technique. COMPARISON:  Head CT 09/12/2023 FINDINGS: Brain: Large subdural hematoma on the left with primarily iso to low density, although a few bands of high-density are present from more recent clot or septation and there is some hazy high-density layering of clot posteriorly. There is maximal thickness is at the left frontal lobe where up to the 4.9 cm thickness with midline shift measuring 1.5 cm at the anterior septum pellucidum. Especially on sagittal reformats there is sign of multi septation. There is a smaller right parafalcine subdural hematoma with layering hematocrit, a subacute feature, thickness up to 5 mm. VP shunt from right posterior approach, tip near the upper third ventricle. The ventricles are decompressed compared to prior  imaging, at least somewhat due to the shift but there may also be increased drainage. Chronic infarct in the left cerebellum Vascular: No acute finding Skull: No acute finding Sinuses/Orbits: No acute finding Other: Critical Value/emergent results were called by telephone at the time of interpretation on 02/24/2024 at 10:01 am to provider [facility] , who verbally acknowledged these results. IMPRESSION: 1. Massive subdural hematoma on the left, primarily low-density with signs of septation. Maximal thickness along the left frontal convexity measures nearly 5 cm with 1.5 cm of midline  shift. 2. Small subacute appearing right parafalcine subdural hematoma measuring up to 5 mm in thickness. 3. VP shunt. Decrease in ventricular volume least partially from the mass effect but there may also be increased drainage. Electronically Signed   By: Tiburcio Pea M.D.   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 rate (!) 23, height 6' 0.01" (1.829 m), weight 107.1 kg, SpO2 94%.  Per report: Alert and oriented x 4 PERRLA CN II-XII grossly intact MAE, Sensation intact, mild generalized weakness Incision is clean, dry, and intact   Disposition: Discharge disposition: 70-Another Health Care Institution Not Defined        Allergies as of 03/03/2024       Reactions   Erythromycin Base Hives        Medication List     TAKE these medications    acetaminophen 325 MG tablet Commonly known as: TYLENOL Take 2 tablets (650 mg total) by mouth every 4 (four) hours as needed for mild pain (pain score 1-3) (temp > 100.5).   amLODipine 5 MG tablet Commonly known as: NORVASC Take 5 mg by mouth daily.   Aspirin Low Dose 81 MG tablet Generic drug: aspirin EC Take 1 tablet (81 mg total)  by mouth daily. Swallow whole.   atomoxetine 80 MG capsule Commonly known as: STRATTERA Take 1 capsule (80 mg total) by mouth daily.   buPROPion 300 MG 24 hr tablet Commonly known as: Wellbutrin XL Take 1 tablet (300 mg total) by mouth daily.   cyanocobalamin 1000 MCG tablet Commonly known as: VITAMIN B12 Take 1,000 mcg by mouth daily.   PriLOSEC OTC 20 MG tablet Generic drug: omeprazole Take 20 mg by mouth daily.   rosuvastatin 10 MG tablet Commonly known as: CRESTOR Take 1 tablet (10 mg total) by mouth daily.   tamsulosin 0.4 MG Caps capsule Commonly known as: FLOMAX Take 0.4 mg by mouth at bedtime.   valsartan-hydrochlorothiazide 80-12.5 MG tablet Commonly known as: DIOVAN-HCT Take 1 tablet by mouth daily.   Vitamin D3 50 MCG (2000 UT) capsule Take 2,000 Units by mouth daily.        Follow-up Information     Julio Sicks, MD Follow up.   Specialty: Neurosurgery Contact information: [address] [place] [address] [phone]                 Signed: Val Eagle, DNP, AGNP-C Nurse Practitioner  [facility] [address], [place], [place] [address] P: [phone]    F: [phone]  03/03/2024, 3:10 PM

## 2024-03-04 DIAGNOSIS — S065XAA Traumatic subdural hemorrhage with loss of consciousness status unknown, initial encounter: Secondary | ICD-10-CM | POA: Diagnosis not present

## 2024-03-04 LAB — CBC WITH DIFFERENTIAL/PLATELET
Abs Immature Granulocytes: 0.29 10*3/uL — ABNORMAL HIGH (ref 0.00–0.07)
Basophils Absolute: 0 10*3/uL (ref 0.0–0.1)
Basophils Relative: 0 %
Eosinophils Absolute: 0 10*3/uL (ref 0.0–0.5)
Eosinophils Relative: 0 %
HCT: 45.5 % (ref 39.0–52.0)
Hemoglobin: 15.6 g/dL (ref 13.0–17.0)
Immature Granulocytes: 2 %
Lymphocytes Relative: 11 %
Lymphs Abs: 2 10*3/uL (ref 0.7–4.0)
MCH: 30.1 pg (ref 26.0–34.0)
MCHC: 34.3 g/dL (ref 30.0–36.0)
MCV: 87.7 fL (ref 80.0–100.0)
Monocytes Absolute: 1.6 10*3/uL — ABNORMAL HIGH (ref 0.1–1.0)
Monocytes Relative: 9 %
Neutro Abs: 13.5 10*3/uL — ABNORMAL HIGH (ref 1.7–7.7)
Neutrophils Relative %: 78 %
Platelets: 261 10*3/uL (ref 150–400)
RBC: 5.19 MIL/uL (ref 4.22–5.81)
RDW: 12.9 % (ref 11.5–15.5)
WBC: 17.5 10*3/uL — ABNORMAL HIGH (ref 4.0–10.5)
nRBC: 0 % (ref 0.0–0.2)

## 2024-03-04 LAB — COMPREHENSIVE METABOLIC PANEL WITH GFR
ALT: 79 U/L — ABNORMAL HIGH (ref 0–44)
AST: 23 U/L (ref 15–41)
Albumin: 2.9 g/dL — ABNORMAL LOW (ref 3.5–5.0)
Alkaline Phosphatase: 47 U/L (ref 38–126)
Anion gap: 8 (ref 5–15)
BUN: 30 mg/dL — ABNORMAL HIGH (ref 8–23)
CO2: 22 mmol/L (ref 22–32)
Calcium: 8.9 mg/dL (ref 8.9–10.3)
Chloride: 102 mmol/L (ref 98–111)
Creatinine, Ser: 0.98 mg/dL (ref 0.61–1.24)
GFR, Estimated: >60 mL/min (ref >60)
Glucose, Bld: 163 mg/dL — ABNORMAL HIGH (ref 70–99)
Potassium: 4 mmol/L (ref 3.5–5.1)
Sodium: 132 mmol/L — ABNORMAL LOW (ref 135–145)
Total Bilirubin: 0.9 mg/dL (ref 0.0–1.2)
Total Protein: 5.7 g/dL — ABNORMAL LOW (ref 6.5–8.1)

## 2024-03-04 MED ORDER — DOCUSATE SODIUM 100 MG PO CAPS
100.0000 mg | ORAL_CAPSULE | Freq: Two times a day (BID) | ORAL | Status: DC
  Administered 2024-03-04 – 2024-03-10 (×9): 100 mg via ORAL
  Filled 2024-03-04 (×11): qty 1

## 2024-03-04 NOTE — Patient Care Conference (Signed)
 Inpatient RehabilitationTeam Conference and Plan of Care Update Date: 03/04/2024   Time: 1004 am    Patient Name: Micheal Ayers      Medical Record Number: [id number]  Date of Birth: [birth year] Sex: Male         Room/Bed: 4M06C/4M06C-01 Payor Info: Payor: [employer] / Plan: [employer] NAP / Product Type: *No Product type* /    Admit Date/Time:  03/03/2024  4:33 PM  Primary Diagnosis:  Subdural hematoma [facility])  Hospital Problems: Principal Problem:   Subdural hematoma [facility])    Expected Discharge Date: Expected Discharge Date:  (evals pending)  Team Members Present: Physician leading conference: Dr. Elijah Birk Social Worker Present: Cecile Sheerer, LCSWA Nurse Present: Konrad Dolores, RN PT Present: Malachi Pro, PT OT Present: Lou Cal, OT SLP Present: Other (comment) Alvera Novel SLP) PPS Coordinator present : Fae Pippin, SLP     Current Status/Progress Goal Weekly Team Focus  Bowel/Bladder   continent bowel/bladder   remain continent      Assess bowel and bladder q shift  Swallow/Nutrition/ Hydration   regular/thin           ADL's   Eval pending   Eval pending   Eval pending    Mobility               Communication   eval pending            Safety/Cognition/ Behavioral Observations  eval pending            Pain   denies pain   0/0 pain      assess pain q shift  Skin   lf scalp healing   continue to heal    Remain free form infection entire stay on rehab     Discharge Planning:  TBA.Marland Kitchen Pt wife reports that she will eb available as much as needed, and can provide physical assistance. SW will confirm there are no barriers.    Team Discussion: Patient was admitted post craniotomy due to large left SDH. Patient has a VP shunt. Patient limited by headaches, orthostasis, mood lability:medications adjusted by MD.   Patient on target to meet rehab goals: Evals pending  *See Care Plan and progress notes for long and short-term goals.    Revisions to Treatment Plan:  Sleep-wake cycle monitoring Behavior plan   Teaching Needs: Safety, medications, transfers, toileting,    Current Barriers to Discharge: Decreased caregiver support and Behavior  Possible Resolutions to Barriers: Family education     Medical Summary Current Status: medically complicated by mood/impulsive behaviors,  hypertension, poor pain control, hyperglycemia,  hyponatremia, BPH, transaminitis and leukocytosis  Barriers to Discharge: Behavior/Mood;Electrolyte abnormality;Medical stability;Uncontrolled Diabetes;Uncontrolled Pain;Self-care education   Possible Resolutions to [employer]: titrate pain medications to minimum tolerated doses for function, monitor vitals and increase BP regimen as approrpiate, monitor labs and for signs of infection w/ leukocytosis, establish sleep-wake cycle and behavioral plan   Continued Need for Acute Rehabilitation Level of Care: The patient requires daily medical management by a physician with specialized training in physical medicine and rehabilitation for the following reasons: Direction of a multidisciplinary physical rehabilitation program to maximize functional independence : Yes Medical management of patient stability for increased activity during participation in an intensive rehabilitation regime.: Yes Analysis of laboratory values and/or radiology reports with any subsequent need for medication adjustment and/or medical intervention. : Yes   I attest that I was present, lead the team conference, and concur with the assessment and plan of the team.  Konrad Dolores 03/04/2024, 1004 am

## 2024-03-04 NOTE — Evaluation (Signed)
 Speech Language Pathology Assessment and Plan  Patient Details  Name: Micheal Ayers MRN: [id number] Date of Birth: 1961-04-30  SLP Diagnosis: Dysphagia;Cognitive Impairments;Speech and Language deficits  Rehab Potential: Excellent ELOS: 7-10 days    Today's Date: 03/04/2024 SLP Individual Time: 0900-1000 SLP Individual Time Calculation (min): 60 min   Hospital Problem: Principal Problem:   Subdural hematoma (HCC)  Past Medical History:  Past Medical History:  Diagnosis Date   ADD (attention deficit disorder)    Allergic rhinitis, cause unspecified    Chronic kidney disease    Acute renal failure   Hemochromatosis, hereditary (HCC)    seen at [facility]   Hypertension    Major depressive disorder    Stroke [facility])    Past Surgical History:  Past Surgical History:  Procedure Laterality Date   CRANIOTOMY Left 02/24/2024   Procedure: CRANIOTOMY HEMATOMA EVACUATION SUBDURAL;  Surgeon: Arman Bogus, MD;  Location: [facility] OR;  Service: Neurosurgery;  Laterality: Left;   elbow surgury  1969   left elbow   INGUINAL HERNIA REPAIR  2003   left   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/2023   Procedure: Shunt Placment - right occipital;  Surgeon: Julio Sicks, MD;  Location: [facility] OR;  Service: Neurosurgery;  Laterality: Right;    Assessment / Plan / Recommendation Clinical Impression Pt is a 63 year old male with history of CVA 01/2023, 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 He was found to have massive left SDH with sings of septations and small subacute right parafalcine SDH with midline shift. He was evaluated by Dr. Yetta Barre and underwent left fronto-temporoparietal crani for evacuation of large chronic LDH the same day. He was placed on decadron and on Keppra for seizure prophylaxis and serial CT head showed increaseing post op  pneumocephalus with residual L-SDH decreased to 6-8 mm from 12 mm and resolution of medline shift. Decadron d/c this am and HA reported to be resolving. Therapy has been working with patient who is noted to have balance deficits with difficulty WB thru RLE, decrease in coordination on the right and noted to have shuffling gait tendency to drift left to right. He was independent and working PTA. CIR recommended due to functional decline.   Cognitive-Linguistic: Pt completed portions of the Bedside Western Aphasia Battery (WAB) and presented w/ adequate auditory comprehension, functional word finding, and written expression. Mild specific word finding and thought formulation deficits noted in conversational tasks as well as structured specific word finding tasks. He also completed the [facility] Mental Status (SLUMS) Exam and scored 27/30. Despite scoring WFL overall, he demonstrated mild deficits in the areas of specific word finding, information processing, and sustained/selective attention. He appeared to demonstrate adequate awareness of deficits and subsequent reasoning/discharge planning. No concerns re motor speech production.  Bedside Swallow: PO trials completed w/ regular textures and thin liquids via straw. He presented w/ slight throat clear x1 w/ thin liquids, though no other overt s/s of airway invasion noted. Anticipate reduced breath/swallow coordination negatively impacted success, as pt reported noticing reduced breath support when eating. He also endorsed rapid rate of intake at baseline. He verbalized understanding of dysphagia education, including general aspiration precautions. He would benefit from dysphagia intervention and potentially introduction of EMST to target improved breath/swallow coordination.   Recommend cont ST to target deficits mentioned above to facilitate improved communication of complex thoughts/ideas, improved cognitive  function, maximize pt independence, and  facilitate return to prev roles/responsibilities.      Skilled Therapeutic Interventions          SLP facilitated a cognitive-linguistic evaluation and brief bedside swallow screen to assess pt's cognitive-communication skills and determine need for additional skilled ST services. See above for more information.    SLP Assessment  Patient will need skilled Speech Lanaguage Pathology Services during CIR admission    Recommendations  SLP Diet Recommendations: Age appropriate regular solids;Thin Liquid Administration via: Straw;Cup Medication Administration: Whole meds with liquid Supervision: Patient able to self feed Compensations: Minimize environmental distractions;Slow rate;Small sips/bites Postural Changes and/or Swallow Maneuvers: Seated upright 90 degrees;Upright 30-60 min after meal Oral Care Recommendations: Oral care BID Recommendations for Other Services: Neuropsych consult;Therapeutic Recreation consult Therapeutic Recreation Interventions: Stress management;Outing/community reintergration Patient destination: Home Follow up Recommendations: Outpatient SLP Equipment Recommended: None recommended by SLP    SLP Frequency 1 to 3 out of 7 days   SLP Duration  SLP Intensity  SLP Treatment/Interventions 7-10 days  Minumum of 1-2 x/day, 30 to 90 minutes  Cognitive remediation/compensation;Internal/external aids;Environmental controls;Speech/Language facilitation;Cueing hierarchy;Dysphagia/aspiration precaution training;Functional tasks;Patient/family education    Pain Pain Assessment Pain Scale: 0-10 Pain Score: 3  Pain Location: Head Pain Intervention(s): Medication (See eMAR)  Prior Functioning Cognitive/Linguistic Baseline: Within functional limits (anticipate some mild STM deficits at baseline d/t neurological hx, however, overall WFL) Type of Home: House  Lives With: Spouse Available Help at Discharge: Family Education: bachelors degree Vocation: Full time  employment  SLP Evaluation Cognition Overall Cognitive Status: Impaired/Different from baseline Arousal/Alertness: Awake/alert Orientation Level: Oriented X4 Year: 2025 Month: April Day of Week: Correct Attention: Sustained;Focused;Selective Focused Attention: Appears intact Sustained Attention: Impaired Sustained Attention Impairment: Verbal complex;Functional complex Selective Attention: Impaired Selective Attention Impairment: Verbal complex;Functional complex Memory: Impaired Memory Impairment: Retrieval deficit Awareness: Appears intact Problem Solving: Appears intact Executive Function: Reasoning Reasoning: Appears intact Behaviors: Lability Safety/Judgment: Appears intact  Comprehension Auditory Comprehension Overall Auditory Comprehension: Appears within functional limits for tasks assessed Expression Expression Primary Mode of Expression: Verbal Verbal Expression Overall Verbal Expression: Impaired Repetition: No impairment Naming: Impairment Responsive: 76-100% accurate Confrontation: Within functional limits Convergent: 75-100% accurate Divergent: 75-100% accurate Other Naming Comments: additional processing time required Pragmatics: No impairment Interfering Components: Attention;Premorbid deficit Non-Verbal Means of Communication: Not applicable Written Expression Dominant Hand: Right Written Expression: Exceptions to [facility] Interfering Components: Legibility Oral Motor Oral Motor/Sensory Function Overall Oral Motor/Sensory Function: Within functional limits Motor Speech Overall Motor Speech: Appears within functional limits for tasks assessed  Care Tool Care Tool Cognition Ability to hear (with hearing aid or hearing appliances if normally used Ability to hear (with hearing aid or hearing appliances if normally used): 0. Adequate - no difficulty in normal conservation, social interaction, listening to TV   Expression of Ideas and Wants Expression of  Ideas and Wants: 3. Some difficulty - exhibits some difficulty with expressing needs and ideas (e.g, some words or finishing thoughts) or speech is not clear   Understanding Verbal and Non-Verbal Content Understanding Verbal and Non-Verbal Content: 4. Understands (complex and basic) - clear comprehension without cues or repetitions  Memory/Recall Ability Memory/Recall Ability : Current season;Location of own room;Staff names and faces;That he or she is in a hospital/hospital unit   Motor Speech Assessment  WFL  Bedside Swallowing Assessment General Date of Onset: 02/24/24 Previous Swallow Assessment: n/a Diet Prior to this Study: Regular;Thin liquids (Level 0) Temperature Spikes Noted: No Respiratory Status: Room air History of Recent  Intubation: No Behavior/Cognition: Alert;Cooperative;Pleasant mood Oral Cavity - Dentition: Adequate natural dentition Self-Feeding Abilities: Able to feed self Patient Positioning: Upright in bed Baseline Vocal Quality: Normal Volitional Swallow: Able to elicit    Short Term Goals: Week 1: SLP Short Term Goal 1 (Week 1): STGs = LTGs d/t ELOS  Refer to Care Plan for Long Term Goals  Recommendations for other services: Neuropsych and Therapeutic Recreation  Stress management and Outing/community reintegration  Discharge Criteria: Patient will be discharged from SLP if patient refuses treatment 3 consecutive times without medical reason, if treatment goals not met, if there is a change in medical status, if patient makes no progress towards goals or if patient is discharged from hospital.  The above assessment, treatment plan, treatment alternatives and goals were discussed and mutually agreed upon: by patient  Pati Gallo 03/04/2024, 10:32 AM

## 2024-03-04 NOTE — Progress Notes (Signed)
 Patient ID: Micheal Ayers, male   DOB: 1961/01/21, 63 y.o.   MRN: [id number]  [id number] spoke with pt wife Lynnea Ferrier to to introduce self, explain role, and discuss discharge process. She confirms she will be available as much as needed, and is self-employed and makes her own schedule. She is aware SW will follow-up with updates.   Cecile Sheerer, MSW, LCSW Office: [phone] Cell: [phone] Fax: [phone]

## 2024-03-04 NOTE — Evaluation (Signed)
 Physical Therapy Assessment and Plan  Patient Details  Name: Micheal Ayers MRN: [id number] Date of Birth: Jan 13, 1961  PT Diagnosis: Abnormality of gait and Difficulty walking Rehab Potential: Good ELOS: 7-10 Days   Today's Date: 03/04/2024 PT Individual Time: 1300-1415 PT Individual Time Calculation (min): 75 min    Hospital Problem: Principal Problem:   Subdural hematoma (HCC)   Past Medical History:  Past Medical History:  Diagnosis Date   ADD (attention deficit disorder)    Allergic rhinitis, cause unspecified    Chronic kidney disease    Acute renal failure   Hemochromatosis, hereditary (HCC)    seen at [facility]   Hypertension    Major depressive disorder    Stroke [facility])    Past Surgical History:  Past Surgical History:  Procedure Laterality Date   CRANIOTOMY Left 02/24/2024   Procedure: CRANIOTOMY HEMATOMA EVACUATION SUBDURAL;  Surgeon: Arman Bogus, MD;  Location: [facility] OR;  Service: Neurosurgery;  Laterality: Left;   elbow surgury  1969   left elbow   INGUINAL HERNIA REPAIR  2003   left   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/2023   Procedure: Shunt Placment - right occipital;  Surgeon: Julio Sicks, MD;  Location: [facility] OR;  Service: Neurosurgery;  Laterality: Right;    Assessment & Plan Clinical Impression: Patient is a 63 year old male with history of CVA 01/2023, 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 He was found to have massive left SDH with sings of septations and small subacute right parafalcine SDH with midline shift. He was evaluated by Dr. Yetta Barre and underwent left fronto-temporoparietal crani for evacuation of large chronic LDH the same day. He was placed on decadron and on Keppra for seizure prophylaxis and serial CT head showed increaseing post op pneumocephalus with residual L-SDH decreased to 6-8 mm  from 12 mm and resolution of medline shift. Decadron d/c this am and HA reported to be resolving. Therapy has been working with patient who is noted to have balance deficits with difficulty WB thru RLE, decrease in coordination on the right and noted to have shuffling gait tendency to drift left to right. He was independent and working PTA.  Patient transferred to CIR on 03/03/2024 .   Patient currently requires min with mobility secondary to muscle weakness, decreased cardiorespiratoy endurance, decreased coordination, and decreased standing balance, decreased postural control, and decreased balance strategies.  Prior to hospitalization, patient was independent  with mobility and lived with Spouse, Daughter in a House home.  Home access is 2Stairs to enter.  Patient will benefit from skilled PT intervention to maximize safe functional mobility, minimize fall risk, and decrease caregiver burden for planned discharge home with intermittent assist.  Anticipate patient will benefit from follow up OP at discharge.  PT - End of Session Activity Tolerance: Tolerates 30+ min activity with multiple rests Endurance Deficit: Yes PT Assessment Rehab Potential (ACUTE/IP ONLY): Good PT Patient demonstrates impairments in the following area(s): Balance;Endurance;Safety PT Transfers Functional Problem(s): Bed Mobility;Bed to Chair;Car;Furniture PT Locomotion Functional Problem(s): Ambulation;Stairs PT Plan PT Intensity: Minimum of 1-2 x/day ,45 to 90 minutes PT Frequency: 5 out of 7 days PT Duration Estimated Length of Stay: 7-10 Days PT Treatment/Interventions: Ambulation/gait training;Community reintegration;Neuromuscular re-education;DME/adaptive equipment instruction;Psychosocial support;Stair training;UE/LE Strength taining/ROM;Balance/vestibular training;Discharge planning;Pain management;Functional electrical stimulation;Therapeutic Activities;Cognitive remediation/compensation;UE/LE Coordination  activities;Functional mobility training;Patient/family education;Therapeutic Exercise PT Transfers Anticipated Outcome(s): Supervision PT Locomotion Anticipated  Outcome(s): Supervision PT Recommendation Recommendations for Other Services: Therapeutic Recreation consult Therapeutic Recreation Interventions: Stress management Follow Up Recommendations: Outpatient PT Patient destination: Home Equipment Recommended: To be determined   PT Evaluation Precautions/Restrictions Precautions Precautions: Fall Precaution/Restrictions Comments: L crani, Monitor vitals Restrictions Weight Bearing Restrictions Per Provider Order: No General Chart Reviewed: Yes Family/Caregiver Present: No  Pain Pain Assessment Pain Scale: 0-10 Pain Score: 2  Pain Type: Acute pain Pain Location: Head Pain Descriptors / Indicators: Headache Pain Intervention(s): Rest;Medication (See eMAR) Pain Interference Pain Interference Pain Effect on Sleep: 1. Rarely or not at all Pain Interference with Therapy Activities: 1. Rarely or not at all Pain Interference with Day-to-Day Activities: 1. Rarely or not at all Home Living/Prior Functioning Home Living Available Help at Discharge: Family;Available 24 hours/day Type of Home: House Home Access: Stairs to enter [employer] of Steps: 2 Entrance Stairs-Rails: Left Home Layout: Two level;Able to live on main level with bedroom/bathroom Alternate Level Stairs-Number of Steps: Flight Alternate Level Stairs-Rails: Right Bathroom Shower/Tub: Tub/shower unit (No DME) Bathroom Toilet: Standard Bathroom Accessibility: Yes  Lives With: Spouse;Daughter Prior Function Level of Independence: Independent with basic ADLs;Independent with homemaking with ambulation;Independent with gait;Independent with transfers Vocation: Full time employment Vision/Perception  Vision - History Ability to See in Adequate Light: 0 Adequate Perception Perception: Within  Functional Limits Praxis Praxis: WFL  Cognition Overall Cognitive Status: Impaired/Different from baseline Arousal/Alertness: Awake/alert Orientation Level: Oriented X4 Year: 2025 Month: April Day of Week: Correct Attention: Sustained;Focused;Selective Focused Attention: Appears intact Sustained Attention: Impaired Sustained Attention Impairment: Verbal complex;Functional complex Selective Attention: Impaired Selective Attention Impairment: Verbal complex;Functional complex Memory: Impaired Memory Impairment: Retrieval deficit Awareness: Appears intact Problem Solving: Appears intact Executive Function: Reasoning Reasoning: Appears intact Behaviors: Lability Safety/Judgment: Appears intact Sensation Sensation Light Touch: Appears Intact Hot/Cold: Appears Intact Coordination Gross Motor Movements are Fluid and Coordinated: No Fine Motor Movements are Fluid and Coordinated: No Coordination and Movement Description: Deficits due to generalized weakness/debility. Decreased FMC in BUE (LUE>RUE). Finger Nose Finger Test: Mild dysmetria with LUE, slowed/effortful. Motor  Motor Motor: Abnormal postural alignment and control Motor - Skilled Clinical Observations: Forward flexion with mobility.  Trunk/Postural Assessment  Cervical Assessment Cervical Assessment: Exceptions to [facility] (Forward head) Thoracic Assessment Thoracic Assessment: Exceptions to [facility] (Rounded shoulders) Lumbar Assessment Lumbar Assessment: Exceptions to [facility] (Posterior pelvic tilt) Postural Control Postural Control: Deficits on evaluation Righting Reactions: Delayed/Inadequate Protective Responses: Delayed/Inadequate  Balance Balance Balance Assessed: Yes Static Sitting Balance Static Sitting - Balance Support: Feet supported Static Sitting - Level of Assistance: 5: Stand by assistance (SUP) Dynamic Sitting Balance Dynamic Sitting - Balance Support: During functional activity Dynamic Sitting - Level of  Assistance: 5: Stand by assistance (SUP-CGA) Dynamic Sitting - Balance Activities: Forward lean/weight shifting;Reaching for objects;Lateral lean/weight shifting Static Standing Balance Static Standing - Balance Support: No upper extremity supported Static Standing - Level of Assistance: 5: Stand by assistance (CGA) Dynamic Standing Balance Dynamic Standing - Balance Support: During functional activity Dynamic Standing - Level of Assistance: 4: Min assist Dynamic Standing - Balance Activities: Lateral lean/weight shifting;Forward lean/weight shifting;Reaching for objects Extremity Assessment  RUE Assessment RUE Assessment: Exceptions to [facility] Active Range of Motion (AROM) Comments: WFL General Strength Comments: 3+/5; decreased FMC LUE Assessment LUE Assessment: Exceptions to [facility] Active Range of Motion (AROM) Comments: WFL General Strength Comments: 3+/5; decreased FMC RLE Assessment RLE Assessment: Within Functional Limits General Strength Comments: endurance impaired LLE Assessment LLE Assessment: Within Functional Limits General Strength Comments: endurance impaired  Care Tool  Care Tool Bed Mobility Roll left and right activity   Roll left and right assist level: Supervision/Verbal cueing    Sit to lying activity   Sit to lying assist level: Supervision/Verbal cueing    Lying to sitting on side of bed activity   Lying to sitting on side of bed assist level: the ability to move from lying on the back to sitting on the side of the bed with no back support.: Supervision/Verbal cueing     Care Tool Transfers Sit to stand transfer   Sit to stand assist level: Minimal Assistance - Patient > 75%    Chair/bed transfer   Chair/bed transfer assist level: Minimal Assistance - Patient > 75%    Car transfer   Car transfer assist level: Minimal Assistance - Patient > 75%      Care Tool Locomotion Ambulation   Assist level: Minimal Assistance - Patient > 75% Assistive device: No  Device Max distance: 100'  Walk 10 feet activity   Assist level: Minimal Assistance - Patient > 75% Assistive device: No Device   Walk 50 feet with 2 turns activity   Assist level: Minimal Assistance - Patient > 75% Assistive device: No Device  Walk 150 feet activity Walk 150 feet activity did not occur: Safety/medical concerns (fatigue)      Walk 10 feet on uneven surfaces activity   Assist level: Minimal Assistance - Patient > 75% Assistive device: Other (comment) (Rt handrail)  Stairs   Assist level: Minimal Assistance - Patient > 75% Stairs assistive device: 2 hand rails Max number of stairs: 12  Walk up/down 1 step activity   Walk up/down 1 step (curb) assist level: Minimal Assistance - Patient > 75% Walk up/down 1 step or curb assistive device: 2 hand rails  Walk up/down 4 steps activity   Walk up/down 4 steps assist level: Minimal Assistance - Patient > 75% Walk up/down 4 steps assistive device: 2 hand rails  Walk up/down 12 steps activity   Walk up/down 12 steps assist level: Minimal Assistance - Patient > 75% Walk up/down 12 steps assistive device: 2 hand rails  Pick up small objects from floor   Pick up small object from the floor assist level: Moderate Assistance - Patient 50 - 74%    Wheelchair Is the patient using a wheelchair?: Yes Type of Wheelchair: Manual   Wheelchair assist level: Dependent - Patient 0% Max wheelchair distance: 150'  Wheel 50 feet with 2 turns activity   Assist Level: Dependent - Patient 0%  Wheel 150 feet activity   Assist Level: Dependent - Patient 0%    Refer to Care Plan for Long Term Goals  SHORT TERM GOAL WEEK 1 PT Short Term Goal 1 (Week 1): STGs = LTGs  Recommendations for other services: Therapeutic Recreation  Stress management  Skilled Therapeutic Intervention  Evaluation completed (see details above and below) with education on PT POC and goals and individual treatment initiated with focus on balance, ambulation, car  transfers, and stair training. Pt received seated in recliner and agrees to therapy. No complaint of pain. Pt performs stand step transfer to [facility] with minA and cues for initiation, hand placement, and sequencing. WC transport to gym for time management. Pt completes ramp navigation and car transfer without AD, with minA and cues for safe sequencing. Following rest break, pt ambulates x100' with minA and tactile cues at trunk for stability, with cues for increasing stride length to decrease risk for falls. Seated rest break. Pt completes  x12 6" steps with bilateral hand rails and minA, with cues for safe step sequencing and tactile cueing to limit slight posterior bias.   Pt completes TUG test with following results: 26.4 seconds, 25.2 seconds, 19.7 seconds. Pt then completes 5x Sit to Stand test, using BUEs, with score of 29.9 seconds.  WC transport back to room. Left seated in recliner with alarm intact and all needs within reach.   Mobility Transfers Transfers: Sit to Stand;Stand to Sit;Stand Pivot Transfers Sit to Stand: Contact Guard/Touching assist;Minimal Assistance - Patient > 75% Stand to Sit: Contact Guard/Touching assist;Minimal Assistance - Patient > 75% Stand Pivot Transfers: Contact Guard/Touching assist;Minimal Assistance - Patient > 75% Transfer (Assistive device): 1 person hand held assist Locomotion  Gait Ambulation: Yes Gait Assistance: Minimal Assistance - Patient > 75% Gait Distance (Feet): 100 Feet Assistive device: None Gait Assistance Details: Verbal cues for sequencing;Verbal cues for technique;Verbal cues for precautions/safety;Tactile cues for posture Gait Gait: Yes Gait Pattern: Impaired Gait Pattern: Decreased stride length Stairs / Additional Locomotion Stairs: Yes Stairs Assistance: Minimal Assistance - Patient > 75% Stair Management Technique: Two rails Number of Stairs: 12 Height of Stairs: 6 Ramp: Minimal Assistance - Patient >75% Curb: Minimal  Assistance - Patient >75%   Discharge Criteria: Patient will be discharged from PT if patient refuses treatment 3 consecutive times without medical reason, if treatment goals not met, if there is a change in medical status, if patient makes no progress towards goals or if patient is discharged from hospital.  The above assessment, treatment plan, treatment alternatives and goals were discussed and mutually agreed upon: by patient  Beau Fanny, PT, DPT 03/04/2024, 2:14 PM

## 2024-03-04 NOTE — Progress Notes (Signed)
 Met with patient to review current situation, team, conference and plan of care. Reviewed medications, incision care. Patient  c/o pain behind his eyes,  followed up pain medication . Patient is continent of bladder but had an incident with urinal last night, urinal changed. Patient was emotional but very thankful to be in rehab. Continue to follow along to provide educational needs to facilitate preparation for discharge.

## 2024-03-04 NOTE — Plan of Care (Signed)
  Problem: RH Balance Goal: LTG Patient will maintain dynamic standing with ADLs (OT) Description: LTG:  Patient will maintain dynamic standing balance with assist during activities of daily living (OT)  Flowsheets (Taken 03/04/2024 1254) LTG: Pt will maintain dynamic standing balance during ADLs with: Supervision/Verbal cueing   Problem: RH Bathing Goal: LTG Patient will bathe all body parts with assist levels (OT) Description: LTG: Patient will bathe all body parts with assist levels (OT) Flowsheets (Taken 03/04/2024 1254) LTG: Pt will perform bathing with assistance level/cueing: Supervision/Verbal cueing   Problem: RH Dressing Goal: LTG Patient will perform upper body dressing (OT) Description: LTG Patient will perform upper body dressing with assist, with/without cues (OT). Flowsheets (Taken 03/04/2024 1254) LTG: Pt will perform upper body dressing with assistance level of: Independent with assistive device Goal: LTG Patient will perform lower body dressing w/assist (OT) Description: LTG: Patient will perform lower body dressing with assist, with/without cues in positioning using equipment (OT) Flowsheets (Taken 03/04/2024 1254) LTG: Pt will perform lower body dressing with assistance level of: Supervision/Verbal cueing   Problem: RH Toileting Goal: LTG Patient will perform toileting task (3/3 steps) with assistance level (OT) Description: LTG: Patient will perform toileting task (3/3 steps) with assistance level (OT)  Flowsheets (Taken 03/04/2024 1254) LTG: Pt will perform toileting task (3/3 steps) with assistance level: Supervision/Verbal cueing   Problem: RH Functional Use of Upper Extremity Goal: LTG Patient will use RT/LT upper extremity as a (OT) Description: LTG: Patient will use right/left upper extremity as a stabilizer/gross assist/diminished/nondominant/dominant level with assist, with/without cues during functional activity (OT) Flowsheets (Taken 03/04/2024 1254) LTG: Use of  upper extremity in functional activities: LUE as nondominant level LTG: Pt will use upper extremity in functional activity with assistance level of: Independent with assistive device   Problem: RH Toilet Transfers Goal: LTG Patient will perform toilet transfers w/assist (OT) Description: LTG: Patient will perform toilet transfers with assist, with/without cues using equipment (OT) Flowsheets (Taken 03/04/2024 1254) LTG: Pt will perform toilet transfers with assistance level of: Supervision/Verbal cueing   Problem: RH Tub/Shower Transfers Goal: LTG Patient will perform tub/shower transfers w/assist (OT) Description: LTG: Patient will perform tub/shower transfers with assist, with/without cues using equipment (OT) Flowsheets (Taken 03/04/2024 1254) LTG: Pt will perform tub/shower stall transfers with assistance level of: Supervision/Verbal cueing   Problem: RH Memory Goal: LTG Patient will demonstrate ability for day to day recall/carry over during activities of daily living with assistance level (OT) Description: LTG:  Patient will demonstrate ability for day to day recall/carry over during activities of daily living with assistance level (OT). Flowsheets (Taken 03/04/2024 1254) LTG:  Patient will demonstrate ability for day to day recall/carry over during activities of daily living with assistance level (OT): Modified Independent

## 2024-03-04 NOTE — Progress Notes (Addendum)
 Inpatient Rehabilitation Admission Medication Review by a Pharmacist  A complete drug regimen review was completed for this patient to identify any potential clinically significant medication issues.  High Risk Drug Classes Is patient taking? Indication by Medication  Antipsychotic Yes, as an intravenous medication Compazine- N/V  Anticoagulant No   Antibiotic No   Opioid Yes Norco- acute pain  Antiplatelet No   Hypoglycemics/insulin No   Vasoactive Medication Yes Avapro, hydrochlorothiazide- HTN Flomax- BPH  Chemotherapy No   Other Yes Keppra- seizure ppx Protonix- GERD Strattera- ADHD Wellbutrin- MDD Melatonin- sleep Benadryl- itching     Type of Medication Issue Identified Description of Issue Recommendation(s)  Drug Interaction(s) (clinically significant)     Duplicate Therapy     Allergy     No Medication Administration End Date     Incorrect Dose     Additional Drug Therapy Needed     Significant med changes from prior encounter (inform family/care partners about these prior to discharge).    Other  PTA meds: Crestor Prilosec Aspirin Diovan- HCT Restart PTA meds when and if necessary during CIR admission or at time of discharge, if warranted  Diovan-HCT- interchanged to avapro/hydrochlorothiazide. Restart Diovan- HCT upon discharge    Clinically significant medication issues were identified that warrant physician communication and completion of prescribed/recommended actions by midnight of the next day:  No    Time spent performing this drug regimen review (minutes):  30  Perrin Eddleman BS, PharmD, BCPS Clinical Pharmacist 03/04/2024 6:47 AM  Contact: [phone] after 3 PM  "Be curious, not judgmental..." -Debbora Dus

## 2024-03-04 NOTE — Plan of Care (Signed)
  Problem: RH Swallowing Goal: LTG Patient will consume least restrictive diet using compensatory strategies with assistance (SLP) Description: LTG:  Patient will consume least restrictive diet using compensatory strategies with assistance (SLP) Flowsheets (Taken 03/04/2024 1630) LTG: Pt Patient will consume least restrictive diet using compensatory strategies with assistance of (SLP): Modified Independent   Problem: RH Expression Communication Goal: LTG Patient will verbally express basic/complex needs(SLP) Description: LTG:  Patient will verbally express basic/complex needs, wants or ideas with cues  (SLP) Flowsheets (Taken 03/04/2024 1630) LTG: Patient will verbally express basic/complex needs, wants or ideas (SLP): Modified Independent Note: Complex thoughts/ideas Goal: LTG Patient will increase word finding of common (SLP) Description: LTG:  Patient will increase word finding of common objects/daily info/abstract thoughts with cues using compensatory strategies (SLP). Flowsheets (Taken 03/04/2024 1630) LTG: Patient will increase word finding of common (SLP): Modified Independent Patient will use compensatory strategies to increase word finding of:  Abstract thoughts  Other (Comment) Note: Specific wf    Problem: RH Attention Goal: LTG Patient will demonstrate this level of attention during functional activites (SLP) Description: LTG:  Patient will will demonstrate this level of attention during functional activites (SLP) 03/04/2024 1633 by Pati Gallo, CCC-SLP Flowsheets (Taken 03/04/2024 1633) LTG: Patient will demonstrate this level of attention during cognitive/linguistic activities with assistance of (SLP): Modified Independent 03/04/2024 1630 by Pati Gallo, CCC-SLP Flowsheets (Taken 03/04/2024 1630) Patient will demonstrate during cognitive/linguistic activities the attention type of: Sustained Patient will demonstrate this level of attention during cognitive/linguistic activities  in: Controlled Number of minutes patient will demonstrate attention during cognitive/linguistic activities: 30 mins   Problem: RH Pre-functional/Other (Specify) Goal: RH LTG SLP (Specify) 1 Description: RH LTG SLP (Specify) 1 Flowsheets (Taken 03/04/2024 1630) LTG: Other SLP (Specify) 1: Pt will process mildly complex information during cognitive tasks @ modI

## 2024-03-04 NOTE — Progress Notes (Signed)
 Inpatient Rehabilitation  Patient information reviewed and entered into eRehab system by [facility]. Karen Kays., CCC/SLP, PPS Coordinator.  Information including medical coding, functional ability and quality indicators will be reviewed and updated through discharge.

## 2024-03-04 NOTE — Progress Notes (Signed)
 PROGRESS NOTE   Subjective/Complaints:  No events overnight. + Headaches with pain behind his eyes, responsive to tylenol but not resolved, only when sitting upright.. Emotional overnight per nursing.  Vitals stable overnight, did have orthostatic hypotension with therapies today.  labs with mild hyponatremia 132. Elevated ALT. WBC 17.5.  P.o. fluid intakes minimal. Last BM 4/7  ROS: Denies fevers, chills, N/V, abdominal pain, constipation, diarrhea, SOB, cough, chest pain, new weakness or paraesthesias.   + Headaches + Orthostatic hypotension  Objective:   No results found. Recent Labs    03/04/24 0504  WBC 17.5*  HGB 15.6  HCT 45.5  PLT 261   Recent Labs    03/03/24 1650 03/04/24 0504  NA 135 132*  K 4.1 4.0  CL 103 102  CO2 20* 22  GLUCOSE 217* 163*  BUN 30* 30*  CREATININE 1.12 0.98  CALCIUM 9.3 8.9    Intake/Output Summary (Last 24 hours) at 03/04/2024 0935 Last data filed at 03/04/2024 0900 Gross per 24 hour  Intake 600 ml  Output 900 ml  Net -300 ml        Physical Exam: Vital Signs Blood pressure 112/70, pulse 79, temperature 97.6 F (36.4 C), temperature source Oral, resp. rate 18, height (P) 6' (1.829 m), SpO2 92%. Constitutional: No apparent distress. Appropriate appearance for age.  Sitting up in bed. HENT: No JVD. Neck Supple. Trachea midline. Atraumatic, normocephalic. Eyes: PERRLA. EOMI. Visual fields grossly intact.  Cardiovascular: RRR, no murmurs/rub/gallops. No Edema. Peripheral pulses 2+  Respiratory: CTAB. No rales, rhonchi, or wheezing. On RA.  Abdomen: + bowel sounds, normoactive. No distention or tenderness.  Skin: C/D/I. No apparent lesions. MSK:      No apparent deformity. + Minimal TTP throughout bilateral neck and shoulders   Neurologic exam:  Cognition: AAO to person, place, time and event.  Very mild higher-level cognitive deficits. Language: Fluent, No substitutions or  neoglisms. Insight: Good  insight into current condition.  Mood: Pleasant affect, appropriate mood.  Strength: Right upper and lower extremity 4+ out of 5 throughout, left upper and lower extremity 5 out of 5 throughout. Sensation: Equal and intact in BL UE and Les.  Reflexes: Negative Hoffman's and babinski signs bilaterally.  1-2 beats of clonus in bilateral lower extremities. CN: 2-12 grossly intact.  Coordination: No apparent tremors. No ataxia on FTN, HTS bilaterally.  Spasticity: MAS 0 in all extremities.    Assessment/Plan: 1. Functional deficits which require 3+ hours per day of interdisciplinary therapy in a comprehensive inpatient rehab setting. Physiatrist is providing close team supervision and 24 hour management of active medical problems listed below. Physiatrist and rehab team continue to assess barriers to discharge/monitor patient progress toward functional and medical goals  Care Tool:  Bathing              Bathing assist       Upper Body Dressing/Undressing Upper body dressing        Upper body assist      Lower Body Dressing/Undressing Lower body dressing            Lower body assist       Toileting Toileting    Toileting  assist       Transfers Chair/bed transfer  Transfers assist           Locomotion Ambulation   Ambulation assist              Walk 10 feet activity   Assist           Walk 50 feet activity   Assist           Walk 150 feet activity   Assist           Walk 10 feet on uneven surface  activity   Assist           Wheelchair     Assist               Wheelchair 50 feet with 2 turns activity    Assist            Wheelchair 150 feet activity     Assist          Blood pressure 112/70, pulse 79, temperature 97.6 F (36.4 C), temperature source Oral, resp. rate 18, height (P) 6' (1.829 m), SpO2 92%.  Medical Problem List and Plan: 1. 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  Antithrombotics: -DVT/anticoagulation:  Mechanical: Sequential compression devices, below knee Bilateral lower extremities             -antiplatelet therapy: N/A secondary to SDH--no injuries/fall per reports.  3. Pain Management: Tylenol prn with hydrocodone prn severe pain--->last used 04/02.    - 4/8: Reports of headache overnight, behind eyes, mostly when sitting upright.  Suspect orthostatic related, will treat as below.  Consider addition of Topamax if no improvement.  4. Mood/Behavior/Sleep: LCSW to follow for evaluation and support.              -has hx of DDD/Panic attacks, on wellbutrin XL 300mg  daily--resume             -antipsychotic agents: N/A             --insomnia: Reports sleeping well.  Melatonin prn.              -check sleep chart at least for tonight             -observe for mood lability  -4-8: Poor sleep overnight secondary to spilling urinal; sleep log appropriate.  Monitor.  5. Neuropsych/cognition: This patient is capable of making decisions on his own behalf.             -has hx of ADD--continue home straterra  6. Skin/Wound Care: Routine pressure relief measures.  7. Fluids/Electrolytes/Nutrition: Monitor I/O. No post op labs noted--will check in am.    - 4/8: Mild hyponatremia and elevated ALT; repeat in AM to trend.  Encourage p.o. fluids today.  Likely due to overdiuresis, but if no improvement with fluid intakes and holding diuretic, will get testing for SIADH consider normal saline bolus.  8. Hx Hydrocephalus s/p VPS/Seizure prophylaxis: On Keppra BID 9. HTN: Monitor BP TID--on hydrochlorothiazide/irbesartan              -  DBP borderline today   - 4/8: BP stable overnight, orthostatic on exam.   DC hydrochlorothiazide, encourage p.o. and fluid intakes.  TED hose when out of bed.     03/04/2024    5:12 AM 03/03/2024    7:57 PM 03/03/2024    5:13 PM  Vitals with BMI  Height   6\' 0"   Systolic 112 125   Diastolic 70 78   Pulse 79 77     10. GERD: Continue Protonix 11. BPH?: Was on Flomax PTA.             -resume flomax -monitor voiding patterns  - No PVRs; continent all voids  12. Leukocytosis. WBC 17.5. No fevers, no obvious suorce of infection. Likely post-op; repeat in AM.     13. Constipation. LBM 4/6 per nursing.  -Colace 100 mg twice daily added    LOS: 1 days A FACE TO FACE EVALUATION WAS PERFORMED  Angelina Sheriff 03/04/2024, 9:35 AM

## 2024-03-04 NOTE — Evaluation (Signed)
 Occupational Therapy Assessment and Plan  Patient Details  Name: Micheal Ayers MRN: [id number] Date of Birth: Mar 20, 1961  OT Diagnosis: abnormal posture, acute pain, cognitive deficits, muscle weakness (generalized), and decreased activity tolerance Rehab Potential: Rehab Potential (ACUTE ONLY): Good ELOS: 7-10 days   Today's Date: 03/04/2024 OT Individual Time: 1104-1200 OT Individual Time Calculation (min): 56 min     Hospital Problem: Principal Problem:   Subdural hematoma (HCC)   Past Medical History:  Past Medical History:  Diagnosis Date   ADD (attention deficit disorder)    Allergic rhinitis, cause unspecified    Chronic kidney disease    Acute renal failure   Hemochromatosis, hereditary (HCC)    seen at [facility]   Hypertension    Major depressive disorder    Stroke [facility])    Past Surgical History:  Past Surgical History:  Procedure Laterality Date   CRANIOTOMY Left 02/24/2024   Procedure: CRANIOTOMY HEMATOMA EVACUATION SUBDURAL;  Surgeon: Arman Bogus, MD;  Location[facility] OR;  Service: Neurosurgery;  Laterality: Left;   elbow surgury  1969   left elbow   INGUINAL HERNIA REPAIR  2003   left   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/2023   Procedure: Shunt Placment - right occipital;  Surgeon: Julio Sicks, MD;  Location: [facility] OR;  Service: Neurosurgery;  Laterality: Right;    Assessment & Plan Clinical Impression: Patient is a 63 year old male with history of CVA 01/2023, 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 He was found to have massive left SDH with sings of septations and small subacute right parafalcine SDH with midline shift. He was evaluated by Dr. Yetta Barre and underwent left fronto-temporoparietal crani for evacuation of large chronic LDH the same day. He was placed on decadron and on Keppra for seizure  prophylaxis and serial CT head showed increaseing post op pneumocephalus with residual L-SDH decreased to 6-8 mm from 12 mm and resolution of medline shift. Decadron d/c this am and HA reported to be resolving. Therapy has been working with patient who is noted to have balance deficits with difficulty WB thru RLE, decrease in coordination on the right and noted to have shuffling gait tendency to drift left to right. He was independent and working PTA. Patient transferred to CIR on 03/03/2024.    Patient currently requires min with basic self-care skills secondary to muscle weakness, decreased cardiorespiratoy endurance, decreased awareness, decreased problem solving, decreased memory, and delayed processing, and decreased standing balance, decreased postural control, and decreased balance strategies.  Prior to hospitalization, patient could complete BADLs and functional mobility independently.   Patient will benefit from skilled intervention to increase independence with basic self-care skills prior to discharge home with care partner.  Anticipate patient will require 24 hour supervision and  OPOT vs HHOT vs no-follow up .  OT - End of Session Activity Tolerance: Tolerates < 10 min activity with changes in vital signs Endurance Deficit: Yes OT Assessment Rehab Potential (ACUTE ONLY): Good OT Barriers to Discharge: Home environment access/layout;Wound Care OT Patient demonstrates impairments in the following area(s): Balance;Cognition;Edema;Endurance;Pain;Safety OT Basic ADL's Functional Problem(s): Bathing;Dressing;Toileting OT Transfers Functional Problem(s): Toilet;Tub/Shower OT Additional Impairment(s): Fuctional Use of Upper Extremity OT Plan OT Intensity: Minimum of 1-2 x/day, 45 to 90 minutes OT Frequency: 5 out of 7 days OT Duration/Estimated Length of Stay: 7-10 days OT Treatment/Interventions: Balance/vestibular training;Cognitive remediation/compensation;Community reintegration;Discharge  planning;Disease mangement/prevention;DME/adaptive equipment  instruction;Neuromuscular re-education;Functional mobility training;Functional electrical stimulation;Pain management;Patient/family education;Psychosocial support;Splinting/orthotics;Skin care/wound managment;Self Care/advanced ADL retraining;Therapeutic Activities;Therapeutic Exercise;UE/LE Strength taining/ROM;Visual/perceptual remediation/compensation;UE/LE Coordination activities OT Basic Self-Care Anticipated Outcome(s): Supervision OT Toileting Anticipated Outcome(s): Supervision OT Bathroom Transfers Anticipated Outcome(s): Supervision OT Recommendation Patient destination: Home Follow Up Recommendations: Home health OT Equipment Recommended: To be determined  OT Evaluation Precautions/Restrictions  Precautions Precautions: Fall Precaution/Restrictions Comments: L crani, Monitor vitals Restrictions Weight Bearing Restrictions Per Provider Order: No General Chart Reviewed: Yes Family/Caregiver Present: No Vital Signs Therapy Vitals BP: 104/77 Patient Position (if appropriate): Orthostatic Vitals Pain Pain Assessment Pain Scale: 0-10 Pain Score: 2  Pain Type: Acute pain Pain Location: Head Pain Descriptors / Indicators: Headache Pain Intervention(s): Rest;Medication (See eMAR) Home Living/Prior Functioning Home Living Living Arrangements: Spouse/significant other Available Help at Discharge: Family, Available 24 hours/day Type of Home: House Home Access: Stairs to enter Secretary/administrator of Steps: 2 Entrance Stairs-Rails: Left Home Layout: Two level, Able to live on main level with bedroom/bathroom Alternate Level Stairs-Number of Steps: Flight Alternate Level Stairs-Rails: Right Bathroom Shower/Tub: Tub/shower unit (No DME) Bathroom Toilet: Standard Bathroom Accessibility: Yes  Lives With: Spouse, Daughter IADL History Education: Probation officer Occupation: Full time employment Type of  Occupation: Clinical biochemist Prior Function Level of Independence: Independent with basic ADLs, Independent with homemaking with ambulation, Independent with gait, Independent with transfers Vocation: Full time employment Vision Baseline Vision/History: 1 Wears glasses (Near-sighted) Ability to See in Adequate Light: 0 Adequate Patient Visual Report: No change from baseline Vision Assessment?: No apparent visual deficits Perception  Perception: Within Functional Limits Praxis Praxis: WFL Cognition Cognition Overall Cognitive Status: Impaired/Different from baseline Arousal/Alertness: Awake/alert Memory: Impaired Memory Impairment: Retrieval deficit Attention: Sustained;Focused;Selective Focused Attention: Appears intact Sustained Attention: Impaired Sustained Attention Impairment: Verbal complex;Functional complex Selective Attention: Impaired Selective Attention Impairment: Verbal complex;Functional complex Awareness: Appears intact Problem Solving: Appears intact Executive Function: Reasoning Reasoning: Appears intact Behaviors: Lability Safety/Judgment: Appears intact Brief Interview for Mental Status (BIMS) Repetition of Three Words (First Attempt): 3 Temporal Orientation: Year: Correct Temporal Orientation: Month: Accurate within 5 days Temporal Orientation: Day: Correct Recall: "Sock": No, could not recall Recall: "Blue": Yes, no cue required Recall: "Bed": Yes, after cueing ("a piece of furniture") BIMS Summary Score: 12 Sensation Sensation Light Touch: Appears Intact Hot/Cold: Appears Intact Coordination Gross Motor Movements are Fluid and Coordinated: No Fine Motor Movements are Fluid and Coordinated: No Coordination and Movement Description: Deficits due to generalized weakness/debility. Decreased FMC in BUE (LUE>RUE). Finger Nose Finger Test: Mild dysmetria with LUE, slowed/effortful. Motor  Motor Motor: Abnormal postural alignment and control Motor  - Skilled Clinical Observations: Forward flexion with mobility.  Trunk/Postural Assessment  Cervical Assessment Cervical Assessment: Exceptions to [facility] (Forward head) Thoracic Assessment Thoracic Assessment: Exceptions to [facility] (Rounded shoulders) Lumbar Assessment Lumbar Assessment: Exceptions to [facility] (Posterior pelvic tilt) Postural Control Postural Control: Deficits on evaluation Righting Reactions: Delayed/Inadequate Protective Responses: Delayed/Inadequate  Balance Balance Balance Assessed: Yes Static Sitting Balance Static Sitting - Balance Support: Feet supported Static Sitting - Level of Assistance: 5: Stand by assistance (SUP) Dynamic Sitting Balance Dynamic Sitting - Balance Support: During functional activity Dynamic Sitting - Level of Assistance: 5: Stand by assistance (SUP-CGA) Dynamic Sitting - Balance Activities: Forward lean/weight shifting;Reaching for objects;Lateral lean/weight shifting Static Standing Balance Static Standing - Balance Support: No upper extremity supported Static Standing - Level of Assistance: 5: Stand by assistance (CGA) Dynamic Standing Balance Dynamic Standing - Balance Support: During functional activity Dynamic Standing - Level of Assistance: 4: Min assist  Dynamic Standing - Balance Activities: Lateral lean/weight shifting;Forward lean/weight shifting;Reaching for objects Extremity/Trunk Assessment RUE Assessment RUE Assessment: Exceptions to [facility] Active Range of Motion (AROM) Comments: WFL General Strength Comments: 3+/5; decreased FMC LUE Assessment LUE Assessment: Exceptions to [facility] Active Range of Motion (AROM) Comments: WFL General Strength Comments: 3+/5; decreased [facility]  Care Tool Care Tool Self Care Eating   Eating Assist Level: Set up assist    Oral Care    Oral Care Assist Level: Contact Guard/Toucning assist    Bathing   Body parts bathed by patient: Right arm;Left arm;Chest;Abdomen;Front perineal area;Buttocks;Right upper  leg;Left upper leg;Right lower leg;Left lower leg;Face     Assist Level: Contact Guard/Touching assist    Upper Body Dressing(including orthotics)   What is the patient wearing?: Pull over shirt   Assist Level: Set up assist    Lower Body Dressing (excluding footwear)   What is the patient wearing?: Underwear/pull up;Pants Assist for lower body dressing: Minimal Assistance - Patient > 75%    Putting on/Taking off footwear   What is the patient wearing?: Socks Assist for footwear: Set up assist       Care Tool Toileting Toileting activity   Assist for toileting: Contact Guard/Touching assist     Care Tool Bed Mobility Roll left and right activity        Sit to lying activity        Lying to sitting on side of bed activity         Care Tool Transfers Sit to stand transfer        Chair/bed transfer         Toilet transfer   Assist Level: Minimal Assistance - Patient > 75%     Care Tool Cognition  Expression of Ideas and Wants Expression of Ideas and Wants: 3. Some difficulty - exhibits some difficulty with expressing needs and ideas (e.g, some words or finishing thoughts) or speech is not clear  Understanding Verbal and Non-Verbal Content Understanding Verbal and Non-Verbal Content: 4. Understands (complex and basic) - clear comprehension without cues or repetitions   Memory/Recall Ability Memory/Recall Ability : Current season;Location of own room;Staff names and faces;That he or she is in a hospital/hospital unit   Refer to Care Plan for Long Term Goals  SHORT TERM GOAL WEEK 1 OT Short Term Goal 1 (Week 1): STGs=LTGs due to patient's ELOS.  Recommendations for other services: None    Skilled Therapeutic Intervention Session began with introduction to OT role, OT POC, and general orientation to rehab unit/schedule. Pt declines need for bathing/dressing, performing simulated movements to denote levels of assistance noted below. Pt with symptomatic OH during  first sit<>stand, vitals noted above, dependent for donning of B TEDs. Improvement in symptoms and unsteadiness. Pt ambulates from EOB>toilet with CGA + RW, Min A (HHA) out of room, standing oral care with CGA. Pt remained sitting in recliner, pad alarm activated, all immediate needs met.   ADL ADL Eating: Set up Where Assessed-Eating: Chair Grooming: Contact guard Where Assessed-Grooming: Standing at sink Upper Body Bathing: Setup;Supervision/safety Where Assessed-Upper Body Bathing: Edge of bed Lower Body Bathing: Contact guard;Minimal assistance Where Assessed-Lower Body Bathing: Edge of bed Upper Body Dressing: Supervision/safety;Setup Where Assessed-Upper Body Dressing: Edge of bed Lower Body Dressing: Contact guard;Minimal assistance Where Assessed-Lower Body Dressing: Edge of bed Toileting: Contact guard;Minimal assistance Where Assessed-Toileting: Teacher, adult education: Curator Method: Proofreader: Bedside commode;Grab bars Tub/Shower Transfer: Unable to assess Film/video editor: Unable  to assess Mobility  Transfers Sit to Stand: Contact Guard/Touching assist;Minimal Assistance - Patient > 75% Stand to Sit: Contact Guard/Touching assist;Minimal Assistance - Patient > 75%   Discharge Criteria: Patient will be discharged from OT if patient refuses treatment 3 consecutive times without medical reason, if treatment goals not met, if there is a change in medical status, if patient makes no progress towards goals or if patient is discharged from hospital.  The above assessment, treatment plan, treatment alternatives and goals were discussed and mutually agreed upon: by patient  Lou Cal, OTR/L, MSOT  03/04/2024, 12:16 PM

## 2024-03-05 DIAGNOSIS — S065XAA Traumatic subdural hemorrhage with loss of consciousness status unknown, initial encounter: Secondary | ICD-10-CM | POA: Diagnosis not present

## 2024-03-05 LAB — COMPREHENSIVE METABOLIC PANEL WITH GFR
ALT: 58 U/L — ABNORMAL HIGH (ref 0–44)
AST: 15 U/L (ref 15–41)
Albumin: 2.7 g/dL — ABNORMAL LOW (ref 3.5–5.0)
Alkaline Phosphatase: 43 U/L (ref 38–126)
Anion gap: 7 (ref 5–15)
BUN: 31 mg/dL — ABNORMAL HIGH (ref 8–23)
CO2: 24 mmol/L (ref 22–32)
Calcium: 8.1 mg/dL — ABNORMAL LOW (ref 8.9–10.3)
Chloride: 102 mmol/L (ref 98–111)
Creatinine, Ser: 1.05 mg/dL (ref 0.61–1.24)
GFR, Estimated: >60 mL/min (ref >60)
Glucose, Bld: 130 mg/dL — ABNORMAL HIGH (ref 70–99)
Potassium: 3.8 mmol/L (ref 3.5–5.1)
Sodium: 133 mmol/L — ABNORMAL LOW (ref 135–145)
Total Bilirubin: 0.9 mg/dL (ref 0.0–1.2)
Total Protein: 5.4 g/dL — ABNORMAL LOW (ref 6.5–8.1)

## 2024-03-05 LAB — CBC WITH DIFFERENTIAL/PLATELET
Abs Immature Granulocytes: 0.2 10*3/uL — ABNORMAL HIGH (ref 0.00–0.07)
Basophils Absolute: 0 10*3/uL (ref 0.0–0.1)
Basophils Relative: 0 %
Eosinophils Absolute: 0.1 10*3/uL (ref 0.0–0.5)
Eosinophils Relative: 1 %
HCT: 43 % (ref 39.0–52.0)
Hemoglobin: 14.8 g/dL (ref 13.0–17.0)
Immature Granulocytes: 2 %
Lymphocytes Relative: 20 %
Lymphs Abs: 2.5 10*3/uL (ref 0.7–4.0)
MCH: 30 pg (ref 26.0–34.0)
MCHC: 34.4 g/dL (ref 30.0–36.0)
MCV: 87.2 fL (ref 80.0–100.0)
Monocytes Absolute: 1.3 10*3/uL — ABNORMAL HIGH (ref 0.1–1.0)
Monocytes Relative: 11 %
Neutro Abs: 8 10*3/uL — ABNORMAL HIGH (ref 1.7–7.7)
Neutrophils Relative %: 66 %
Platelets: 217 10*3/uL (ref 150–400)
RBC: 4.93 MIL/uL (ref 4.22–5.81)
RDW: 13 % (ref 11.5–15.5)
WBC: 12.1 10*3/uL — ABNORMAL HIGH (ref 4.0–10.5)
nRBC: 0 % (ref 0.0–0.2)

## 2024-03-05 MED ORDER — SODIUM CHLORIDE 0.9 % IV SOLN: INTRAVENOUS | Status: AC

## 2024-03-05 NOTE — Progress Notes (Signed)
 Inpatient Rehabilitation Care Coordinator Assessment and Plan Patient Details  Name: Micheal Ayers MRN: [id number] Date of Birth: [birth year]  Today's Date: 03/05/2024  Hospital Problems: Principal Problem:   Subdural hematoma [facility])  Past Medical History:  Past Medical History:  Diagnosis Date   ADD (attention deficit disorder)    Allergic rhinitis, cause unspecified    Chronic kidney disease    Acute renal failure   Hemochromatosis, hereditary (HCC)    seen at [facility]   Hypertension    Major depressive disorder    Stroke [facility])    Past Surgical History:  Past Surgical History:  Procedure Laterality Date   CRANIOTOMY Left 02/24/2024   Procedure: CRANIOTOMY HEMATOMA EVACUATION SUBDURAL;  Surgeon: Arman Bogus, MD;  Location: [facility] OR;  Service: Neurosurgery;  Laterality: Left;   elbow surgury  1969   left elbow   INGUINAL HERNIA REPAIR  2003   left   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/2023   Procedure: Shunt Placment - right occipital;  Surgeon: Julio Sicks, MD;  Location: [facility] OR;  Service: Neurosurgery;  Laterality: Right;   Social History:  reports that he has quit smoking. He has never used smokeless tobacco. He reports current alcohol use. He reports that he does not use drugs.  Family / Support Systems Marital Status: Married How Long?: 27 years Patient Roles: Spouse, Parent Spouse/Significant Other: Lynnea Ferrier (wife) Children: adult dtr- Meredith (25). She lives in their home, however, he reports she is moving into her own place and moving out soon. Other Supports: none Anticipated Caregiver: wife Ability/Limitations of Caregiver: Wife reports she will provide 24/7 care as she is self-employed. Caregiver Availability: 24/7 Family Dynamics: Pt lives in the home with his wife and daughter.  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 Pt reports he is with a new company; no longer than 4 months. Legal History/Current Legal Issues: Denies Guardian/Conservator: N/A   Abuse/Neglect Abuse/Neglect Assessment Can Be Completed: Yes Physical Abuse: Denies Verbal Abuse: Denies Sexual Abuse: Denies Exploitation of patient/patient's resources: Denies Self-Neglect: Denies  Patient response to: Social Isolation - How often do you feel lonely or isolated from those around you?: Never  Emotional Status Pt's affect, behavior and adjustment status: Pt in good spirits at time of visit Recent Psychosocial Issues: Denies Psychiatric History: ADHD- provider with [facility]; medication-atomoxetine Substance Abuse History: Denies  Patient / Family Perceptions, Expectations & Goals Pt/Family understanding of illness & functional limitations: Pt and wife have a general understanding of care needs Premorbid pt/family roles/activities: Independent Anticipated changes in roles/activities/participation: Assistance with ADLs/IADLs Pt/family expectations/goals: Pt goal is to work on getting back to a regular lifestyle, and perform self-care needs  [employer]: None Premorbid Home Care/DME Agencies: None Transportation available at discharge: TBD Is the patient able to respond to transportation needs?: No In the past 12 months, has lack of transportation kept you from medical appointments or from getting medications?: No In the past 12 months, has lack of transportation kept you from meetings, work, or from getting things needed for daily living?: No Resource referrals recommended: Neuropsychology  Discharge Planning Living Arrangements: Spouse/significant other Support Systems:  Spouse/significant other Type of Residence: Private residence Insurance Resources: Private  Insurance (specify) Administrator) Financial Screen Referred: No Living Expenses: Banker Management: Patient, Spouse Does the patient have any problems obtaining your medications?: No Home Management: Wife prepares meals and does housecleaning, Patient/Family Preliminary Plans: No changes Care Coordinator Barriers to Discharge: Decreased caregiver support, Lack of/limited family support, Insurance for SNF coverage Care Coordinator Anticipated Follow Up Needs: HH/OP  Clinical Impression SW met with pt while he was sitting up eating lunch. Pt is not a Cytogeneticist. No HCPOA. No DME. He is aware SW spoke with his wife earlier, and SW will follow-up once there are more updates.   Gretchen Short 03/05/2024, 9:33 AM

## 2024-03-05 NOTE — Progress Notes (Signed)
 Patient ID: Micheal Ayers, male   DOB: 1961/02/04, 63 y.o.   MRN: [id number]  [phone] SW left message for pt wife Lynnea Ferrier to inform on short ELOS and schedule family edu Friday or Monday. SW waiting on follow-up.   Cecile Sheerer, MSW, LCSW Office: [phone] Cell: [phone] Fax: [phone]

## 2024-03-05 NOTE — Progress Notes (Signed)
 Please assess and attempt IV placement prior to IVT consult. Patient has excellent vasculature

## 2024-03-05 NOTE — Care Management (Signed)
 Inpatient Rehabilitation Center Individual Statement of Services  Patient Name:  Micheal Ayers  Date:  03/05/2024  Welcome to the Inpatient Rehabilitation Center.  Our goal is to provide you with an individualized program based on your diagnosis and situation, designed to meet your specific needs.  With this comprehensive rehabilitation program, you will be expected to participate in at least 3 hours of rehabilitation therapies Monday-Friday, with modified therapy programming on the weekends.  Your rehabilitation program will include the following services:  Physical Therapy (PT), Occupational Therapy (OT), Speech Therapy (ST), 24 hour per day rehabilitation nursing, Therapeutic Recreaction (TR), Psychology, Neuropsychology, Care Coordinator, Rehabilitation Medicine, Nutrition Services, Pharmacy Services, and Other  Weekly team conferences will be held on Tuesdays to discuss your progress.  Your Inpatient Rehabilitation Care Coordinator will talk with you frequently to get your input and to update you on team discussions.  Team conferences with you and your family in attendance may also be held.  Expected length of stay: 7-10 days    Overall anticipated outcome: Supervision  Depending on your progress and recovery, your program may change. Your Inpatient Rehabilitation Care Coordinator will coordinate services and will keep you informed of any changes. Your Inpatient Rehabilitation Care Coordinator's name and contact numbers are listed  below.  The following services may also be recommended but are not provided by the Inpatient Rehabilitation Center:  Driving Evaluations Home Health Rehabiltiation Services Outpatient Rehabilitation Services Vocational Rehabilitation   Arrangements will be made to provide these services after discharge if needed.  Arrangements include referral to agencies that provide these services.  Your insurance has been verified to be:  Community education officer  Your primary doctor is:   Mila Palmer  Pertinent information will be shared with your doctor and your insurance company.  Inpatient Rehabilitation Care Coordinator:  Susie Cassette [phone] or [phone]  Information discussed with and copy given to patient by: Gretchen Short, 03/05/2024, 9:34 AM

## 2024-03-05 NOTE — Progress Notes (Signed)
 PROGRESS NOTE   Subjective/Complaints:  No events overnight.  Headache ongoing, mild this a.m., 3 out of 10. Hypotensive this a.m., 94/51, other vital stable.  P.o. fluid intakes approximately 600 cc yesterday. BUN and creatinine slightly up from prior; sodium improved to 133. WBC is downtrending No other concerns, complaints for today. Slept better overnight  ROS: Denies fevers, chills, N/V, abdominal pain, constipation, diarrhea, SOB, cough, chest pain, new weakness or paraesthesias.   + Headaches--no complaints today + Orthostatic hypotension--ongoing  Objective:   No results found. Recent Labs    03/04/24 0504 03/05/24 0511  WBC 17.5* 12.1*  HGB 15.6 14.8  HCT 45.5 43.0  PLT 261 217   Recent Labs    03/04/24 0504 03/05/24 0511  NA 132* 133*  K 4.0 3.8  CL 102 102  CO2 22 24  GLUCOSE 163* 130*  BUN 30* 31*  CREATININE 0.98 1.05  CALCIUM 8.9 8.1*    Intake/Output Summary (Last 24 hours) at 03/05/2024 0828 Last data filed at 03/05/2024 0800 Gross per 24 hour  Intake 840 ml  Output 500 ml  Net 340 ml        Physical Exam: Vital Signs Blood pressure 113/75, pulse 78, temperature (!) 97.5 F (36.4 C), temperature source Oral, resp. rate 18, height (P) 6' (1.829 m), SpO2 94%. Constitutional: No apparent distress. Appropriate appearance for age. Sitting in WC in gym.  HENT: No JVD. Neck Supple. Trachea midline. Atraumatic, normocephalic. Eyes: PERRLA. EOMI. Visual fields grossly intact.  Cardiovascular: RRR, no murmurs/rub/gallops. No Edema. Peripheral pulses 2+  Respiratory: CTAB. No rales, rhonchi, or wheezing. On RA.  Abdomen: + bowel sounds, normoactive. No distention or tenderness.  Skin: C/D/I. No apparent lesions. + PIV, intact  MSK:      No apparent deformity. + Minimal TTP throughout bilateral neck and shoulders--improved   Neurologic exam:  Cognition: AAO to person, place, time and event.   Very mild higher-level cognitive deficits. Language: Fluent, No substitutions or neoglisms. Insight: Good  insight into current condition.  Mood: Pleasant affect, appropriate mood.  Strength: Right upper and lower extremity 4+ out of 5 throughout, left upper and lower extremity 5 out of 5 throughout. Sensation: Equal and intact in BL UE and Les.  Reflexes: Negative Hoffman's and babinski signs bilaterally.  1-2 beats of clonus in bilateral lower extremities. CN: 2-12 grossly intact.  Coordination: No apparent tremors. No ataxia on FTN, HTS bilaterally.  Spasticity: MAS 0 in all extremities.  --no changes 4/9  Assessment/Plan: 1. Functional deficits which require 3+ hours per day of interdisciplinary therapy in a comprehensive inpatient rehab setting. Physiatrist is providing close team supervision and 24 hour management of active medical problems listed below. Physiatrist and rehab team continue to assess barriers to discharge/monitor patient progress toward functional and medical goals  Care Tool:  Bathing    Body parts bathed by patient: Right arm, Left arm, Chest, Abdomen, Front perineal area, Buttocks, Right upper leg, Left upper leg, Right lower leg, Left lower leg, Face         Bathing assist Assist Level: Contact Guard/Touching assist     Upper Body Dressing/Undressing Upper body dressing   What is  the patient wearing?: Pull over shirt    Upper body assist Assist Level: Set up assist    Lower Body Dressing/Undressing Lower body dressing      What is the patient wearing?: Underwear/pull up, Pants     Lower body assist Assist for lower body dressing: Minimal Assistance - Patient > 75%     Toileting Toileting    Toileting assist Assist for toileting: Contact Guard/Touching assist     Transfers Chair/bed transfer  Transfers assist     Chair/bed transfer assist level: Minimal Assistance - Patient > 75%     Locomotion Ambulation   Ambulation assist       Assist level: Minimal Assistance - Patient > 75% Assistive device: No Device Max distance: 100'   Walk 10 feet activity   Assist     Assist level: Minimal Assistance - Patient > 75% Assistive device: No Device   Walk 50 feet activity   Assist    Assist level: Minimal Assistance - Patient > 75% Assistive device: No Device    Walk 150 feet activity   Assist Walk 150 feet activity did not occur: Safety/medical concerns (fatigue)         Walk 10 feet on uneven surface  activity   Assist     Assist level: Minimal Assistance - Patient > 75% Assistive device: Other (comment) (Rt handrail)   Wheelchair     Assist Is the patient using a wheelchair?: Yes Type of Wheelchair: Manual    Wheelchair assist level: Dependent - Patient 0% Max wheelchair distance: 150'    Wheelchair 50 feet with 2 turns activity    Assist        Assist Level: Dependent - Patient 0%   Wheelchair 150 feet activity     Assist      Assist Level: Dependent - Patient 0%   Blood pressure 113/75, pulse 78, temperature (!) 97.5 F (36.4 C), temperature source Oral, resp. rate 18, height (P) 6' (1.829 m), SpO2 94%.  Medical Problem List and Plan: 1. 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  Antithrombotics: -DVT/anticoagulation:  Mechanical: Sequential compression devices, below knee Bilateral lower extremities             -antiplatelet therapy: N/A secondary to SDH--no injuries/fall per reports.  3. Pain Management: Tylenol prn with hydrocodone prn severe pain--->last used 04/02.    - 4/8: Reports of headache overnight, behind eyes, mostly when sitting upright.  Suspect orthostatic related, will treat as below.  Consider addition of Topamax if no  improvement.  4-9: HA improved; monitor  4. Mood/Behavior/Sleep: LCSW to follow for evaluation and support.              -has hx of DDD/Panic attacks, on wellbutrin XL 300mg  daily--resume             -antipsychotic agents: N/A             --insomnia: Reports sleeping well.  Melatonin prn.              -check sleep chart at least for tonight             -  observe for mood lability  -4-8: Poor sleep overnight secondary to spilling urinal; sleep log appropriate.  Monitor.  4/9: slept better  5. Neuropsych/cognition: This patient is capable of making decisions on his own behalf.             -has hx of ADD--continue home straterra  6. Skin/Wound Care: Routine pressure relief measures.  7. Fluids/Electrolytes/Nutrition: Monitor I/O. No post op labs noted--will check in am.    - 4/8: Mild hyponatremia and elevated ALT; repeat in AM to trend.  Encourage p.o. fluids today.  Likely due to overdiuresis, but if no improvement with fluid intakes and holding diuretic, will get testing for SIADH consider normal saline bolus.  4-9: Sodium slightly improved, liver function tests normalizing.  Repeat before end of week  8. Hx Hydrocephalus s/p VPS/Seizure prophylaxis: On Keppra BID 9. HTN: Monitor BP TID--on hydrochlorothiazide/irbesartan              -DBP borderline today   - 4/8: BP stable overnight, orthostatic on exam.  DC hydrochlorothiazide, encourage p.o. and fluid intakes.  TED hose when out of bed.  - 4-9: Remains hypotensive today.  Will give 500 cc bolus of normal saline.  Encourage p.o. fluids.     03/05/2024    7:47 AM 03/05/2024    5:23 AM 03/04/2024    8:04 PM  Vitals with BMI  Systolic 113 94 91  Diastolic 75 51 79  Pulse 78 70 91    10. GERD: Continue Protonix 11. BPH?: Was on Flomax PTA.             -resume flomax -monitor voiding patterns  - No PVRs; continent all voids, frequency reasonable  12. Leukocytosis. WBC 17.5. No fevers, no obvious suorce of infection. Likely post-op;  repeat in AM.     -4-9: Improving to 12; no signs of infection.  Continue monitoring.  13. Constipation. LBM 4/6 per nursing.  -Colace 100 mg twice daily added    - LBM 4/8 large  LOS: 2 days A FACE TO FACE EVALUATION WAS PERFORMED  Angelina Sheriff 03/05/2024, 8:28 AM

## 2024-03-05 NOTE — Progress Notes (Signed)
 Occupational Therapy Session Note  Patient Details  Name: Micheal Ayers MRN: [id number] Date of Birth: 1961-03-03  Session 1 Today's Date: 03/05/2024 OT Individual Time: 0830-0900 OT Individual Time Calculation (min): 30 min    Session 2 Today's Date: 03/05/2024 OT Individual Time: 4098-1191 OT Individual Time Calculation (min): 27 min    Short Term Goals: Week 1:  OT Short Term Goal 1 (Week 1): STGs=LTGs due to patient's ELOS.  Skilled Therapeutic Interventions/Progress Updates:    Session 1 Pt received supine with no c/o pain, agreeable to OT session. He came to EOB with (S). Pt discussed events of hospitalization and became emotional discussing family. He completed functional mobility into the bathroom with min A. He completed standing level toileting tasks with CGA. He completed oral care standing at the sink with CGA. Seated rest break required following. He completed 200 ft of functional mobilty with min a, HHA and no device.   Session 2 Pt received sitting up with no c/o pain, agreeable to OT session. Skilled monitoring of vitals throughout session to ensure hemodynamic and cardiorespiratory stability.  BP seated: 113/83 (93), HR 79. Standing 93/69 (77), 90 bpm. Donned knee high teds  and BP reassessed standing- 84/66 (74), 87 bpm. DO/RN informed. Provided [place] education to pt. Remained seated for rest of session, focus on UB strengthening. He completed 3x10 alternating circuit of forward and overhead press with a 5 lb dowel to improve UE strengthening and endurance for ADLs carryover. He was left sitting in the recliner with all needs met, chair alarm set.     Therapy Documentation Precautions:  Precautions Precautions: Fall Precaution/Restrictions Comments: L crani, Monitor vitals Restrictions Weight Bearing Restrictions Per Provider Order: No  Therapy/Group: Individual Therapy  [name] 03/05/2024, 6:18 AM

## 2024-03-05 NOTE — Plan of Care (Signed)
  Problem: RH BOWEL ELIMINATION Goal: RH STG MANAGE BOWEL WITH ASSISTANCE Description: STG Manage Bowel with Mod I Assistance. Outcome: Progressing   Problem: RH BLADDER ELIMINATION Goal: RH STG MANAGE BLADDER WITH ASSISTANCE Description: STG Manage Bladder With  Mod I  Assistance Outcome: Progressing   Problem: RH SKIN INTEGRITY Goal: RH STG SKIN FREE OF INFECTION/BREAKDOWN Description: Manage skin free of infection/breakdown with mod I assistance Outcome: Progressing   Problem: RH SAFETY Goal: RH STG ADHERE TO SAFETY PRECAUTIONS W/ASSISTANCE/DEVICE Description: STG Adhere to Safety Precautions With  mod I Assistance/Device. Outcome: Progressing

## 2024-03-05 NOTE — Progress Notes (Signed)
 Speech Language Pathology Daily Session Note  Patient Details  Name: Micheal Ayers MRN: [id number] Date of Birth: November 03, 1961  Today's Date: 03/05/2024 SLP Individual Time: 1330-1415 SLP Individual Time Calculation (min): 45 min  Short Term Goals: Week 1: SLP Short Term Goal 1 (Week 1): STGs = LTGs d/t ELOS  Skilled Therapeutic Interventions:   Pt greeted in his room. He was awake/alert upon SLP arrival and very agreeable to tx tasks targeting cognition and language. He independently recalled information from today, including hypotension and subsequent IV fluids w/ 100% accuracy. Also discussed tentative d/c date set for 4/17 and he continues to demonstrate adequate reasoning and planning re d/c home. He completed written money management calculations independently and completed a deductive reasoning task targeting information processing, sustained attention, and organization. He benefited from modA overall; majority of assist required for information processing. He benefited from additional time to complete task, though adequate attention to details noted. At the end of tx tasks, he was left in his recliner with the alarm set and call light within reach. Recommend cont ST per POC.   Pain  No pain reported  Therapy/Group: Individual Therapy  Pati Gallo 03/05/2024, 1:47 PM

## 2024-03-05 NOTE — Progress Notes (Signed)
 Physical Therapy Session Note  Patient Details  Name: Micheal Ayers MRN: [id number] Date of Birth: 06/06/1961  Today's Date: 03/05/2024 PT Individual Time: 4403-4742 PT Individual Time Calculation (min): 28 min   Today's Date: 03/05/2024 PT Individual Time: 1418-1530 PT Individual Time Calculation (min): 72 min   Short Term Goals: Week 1:  PT Short Term Goal 1 (Week 1): STGs = LTGs  Skilled Therapeutic Interventions/Progress Updates:     1st Session: Pt received seated in recliner and agrees to therapy. Reports some pain behind eyes, as a headache. PT provides rest breaks as needed to manage pain. Pt performs stand step transfer to [facility] with CGA and cues for positioning. WC transport to gym for time management. Pt completes standing activity in parallel bars for NMR and strengthening. Pt faces mirror for visual feedback, holding onto bar with BUEs. Pt completes 2x20 high knee marches with cue to tap thighs to bar for each rep, as well as cueing for upright gaze and posture to improve body mechanics and balance. Pt takes seated rest break and PT raises bar to increase challenge. Pt then completes additional x20 with BUE support, then x20 with only RUE support. PT provides cues for complete weight shifting into stance leg to promote improved balance. Following rest break, pt completes x10 squats and x20 heel raises with upper extremity support. WC transport back to room. Stand step to recliner with CGA. Left seated with all needs within reach.   2nd Session: Pt received seated in [facility] and agrees to therapy. NO complaint of pain. Stand step transfer to [facility] with cues for posture and safety. WC transport to gym. Pt stands with CGA, then ambulates x175' with pt holding on IV pole with RUE and PT providing minA at trunk for stability and to correct for slight anterior trunk lean. Pt visibly fatigued following bout but no overt LOB noted, and pt does not verbalize symptoms of hypotension, other than overall  fatigue. Pt then completes 3x5 reps sit to stand without use of upper extremities to promote increased balance challenge as well as strengthening. Pt performs 2x10 foot taps on 8" step with minA at trunk and cues for lateral weight shifting fully into stance leg, with tactile cueing at trunk for posture and stability. Pt ambulates x30 to restroom with minA at trunk, with cues for positioning for transfer onto toilet. Pt is continent of bowel and performs peri care in standing with CGA. Pt washes hand sand ambulates additional x30' with cues for monitoring physiologic response to activity and upright positioning to increase safety awareness. Pt completes Nustep for endurance training and reciprocal coordination training. Pt completes x10:00 at workload of 5 with average steps per minute ~43. PT provides cues for hand and foot placement and completing full available ROM. WC transport back to room. Pt left seated in recliner with alarm intact and all needs within reach.    Therapy Documentation Precautions:  Precautions Precautions: Fall Precaution/Restrictions Comments: L crani, Monitor vitals Restrictions Weight Bearing Restrictions Per Provider Order: No   Therapy/Group: Individual Therapy  Beau Fanny, PT, DPT 03/05/2024, 4:30 PM

## 2024-03-06 DIAGNOSIS — S065XAA Traumatic subdural hemorrhage with loss of consciousness status unknown, initial encounter: Secondary | ICD-10-CM | POA: Diagnosis not present

## 2024-03-06 NOTE — Progress Notes (Signed)
 Speech Language Pathology Daily Session Note  Patient Details  Name: Micheal Ayers MRN: [id number] Date of Birth: 04/11/1961  Today's Date: 03/06/2024 SLP Individual Time: 1430-1530 SLP Individual Time Calculation (min): 60 min  Short Term Goals: Week 1: SLP Short Term Goal 1 (Week 1): STGs = LTGs d/t ELOS  Skilled Therapeutic Interventions:   Pt greeted in his room. He was awake/alert upon SLP arrival and very cooperative throughout tx tasks targeting cognitive-linguistic skills. He completed deductive reasoning task targeting attention, information processing, and organization. He demonstrated improved information processing and attention to detail as compared to prev tx session, as he only required s cues. He also completed a specific wf task naming 10 items re to provided words. He required only s cues for specific word finding and working memory. Of note, he benefited from additional processing time throughout tasks. He also completed a timed object description task, requiring only minA for more detailed descriptions. At the end of tx tasks, he was left in his chair with the alarm in place and call light within reach. Recommend cont ST per POC.    Pain  No pain reported  Therapy/Group: Individual Therapy  Pati Gallo 03/06/2024, 3:56 PM

## 2024-03-06 NOTE — Progress Notes (Signed)
 Occupational Therapy Session Note  Patient Details  Name: Micheal Ayers MRN: [id number] Date of Birth: [birth year]  Today's Date: 03/06/2024 OT Individual Time: 1015-1100 OT Individual Time Calculation (min): 45 min    Short Term Goals: Week 1:  OT Short Term Goal 1 (Week 1): STGs=LTGs due to patient's ELOS.  Skilled Therapeutic Interventions/Progress Updates:    Pt resting in recliner upon arrival. Initial focus on donning Ted hose and socks. Pt able to complete task without assistance with extra time. Ongoing discharge planning.   9 Hole Peg Test is used to measure finger dexterity in pts with various neurological diagnoses. - Instructions The pt was instructed to pick up the pegs one at a time, using their dominant hand first and put them into the holes in any order until the holes were all filled. The pt then removed the pegs one at a time and returned them to the container. Both hands were tested separately.  - Results LUE The pt completed the test in 32.03 seconds.  RUE The pt completed the test in 26.36 seconds Scores are based on the time taken to complete the activity. The timer started the moment the pt touched the first peg until the moment the last peg hit the container.  - Norms for healthy males ages 36-70+ 79-55 R 18.9 L 19.84 56-60 R 20.90 L 21.64 61-65 R 20.87 L 21.60 66-70 R 21.23 L 22.29 71+ R 25.79 L 25.95  Box and Blocks Test measures unilateral gross manual dexterity. - Instructions The pt was instructed to carry one block over at a time and go as quickly as they could, making sure their fingertips crossed the partition. One minute was given to complete the task per UE. The pt was allowed a 15-second trial period prior to testing if needed. - Results The pt transferred 44 blocks with the R hand and 38 with the L hand. The total number of blocks carried from one compartment to the other in one minute is scored per hand. Higher scores on the  test indicate better gross manual dexterity.  - Norms for adults males 50-75+ -50-54 R 79 L 77.0 -55-59 R 75.2 L 73.8 -60-64 R 71.3 L 70.5 -65-69 R 68.5 L 67.4 -70-74 R 66.3 L 64.33 -75+ R 63.0 L 61.3  Sit<>stand with supervision.  BP sitting-118/83 BP standing-118/77 No s/s  Pt remained in recliner with all needs within reach. Seat alarm activated.      Therapy Documentation Precautions:  Precautions Precautions: Fall Precaution/Restrictions Comments: L crani, Monitor vitals Restrictions Weight Bearing Restrictions Per Provider Order: No   Pain: Pt reports 1/10 HA; no intervention requested   Therapy/Group: Individual Therapy  Rich Brave 03/06/2024, 12:04 PM

## 2024-03-06 NOTE — Progress Notes (Signed)
 PROGRESS NOTE   Subjective/Complaints:  No events overnight. BP improved today.  Denies any symptoms of orthostasis with therapies.   He states his headache is approximately 1 out of 5 after Tylenol, nagging but not bothersome.  This morning, he is asking if there are any genetic contributors to his hydrocephalus; states his surgeon told him that sometimes people with larger heads are more susceptible to it.  Discussed that provider was not aware of any genetic tendency towards it, but neurosurgery would be more experienced with that.  He was understanding.  ROS: Denies fevers, chills, N/V, abdominal pain, constipation, diarrhea, SOB, cough, chest pain, new weakness or paraesthesias.   + Headaches--minimal, improving + Orthostatic hypotension--improved  Objective:   No results found. Recent Labs    03/04/24 0504 03/05/24 0511  WBC 17.5* 12.1*  HGB 15.6 14.8  HCT 45.5 43.0  PLT 261 217   Recent Labs    03/04/24 0504 03/05/24 0511  NA 132* 133*  K 4.0 3.8  CL 102 102  CO2 22 24  GLUCOSE 163* 130*  BUN 30* 31*  CREATININE 0.98 1.05  CALCIUM 8.9 8.1*    Intake/Output Summary (Last 24 hours) at 03/06/2024 0935 Last data filed at 03/06/2024 1610 Gross per 24 hour  Intake 1368.44 ml  Output 800 ml  Net 568.44 ml        Physical Exam: Vital Signs Blood pressure 131/80, pulse 75, temperature (!) 97.5 F (36.4 C), resp. rate 17, height (P) 6' (1.829 m), SpO2 98%. Constitutional: No apparent distress. Appropriate appearance for age.  Sitting up in bed. HENT: No JVD. Neck Supple. Trachea midline. Atraumatic, normocephalic. Eyes: PERRLA. EOMI. Visual fields grossly intact.  Cardiovascular: RRR, no murmurs/rub/gallops. No Edema.  TED hose donned. peripheral pulses 2+  Respiratory: CTAB. No rales, rhonchi, or wheezing. On RA.  Abdomen: + bowel sounds, normoactive. No distention or tenderness.  Skin: C/D/I. No  apparent lesions. + PIV, intact  MSK:      No apparent deformity. + Minimal TTP throughout bilateral neck and shoulders--not examined f4-10  Neurologic exam:  Cognition: AAO to person, place, time and event.  Very mild higher-level cognitive deficits.  Some intermittent, rare emotional lability, but overall appropriate.  Good insight into current condition.  Sensation equal throughout.  5 out of 5 bilateral upper and lower extremities, with very mild right upper and lower extremity weakness.    Assessment/Plan: 1. Functional deficits which require 3+ hours per day of interdisciplinary therapy in a comprehensive inpatient rehab setting. Physiatrist is providing close team supervision and 24 hour management of active medical problems listed below. Physiatrist and rehab team continue to assess barriers to discharge/monitor patient progress toward functional and medical goals  Care Tool:  Bathing    Body parts bathed by patient: Right arm, Left arm, Chest, Abdomen, Front perineal area, Buttocks, Right upper leg, Left upper leg, Right lower leg, Left lower leg, Face         Bathing assist Assist Level: Contact Guard/Touching assist     Upper Body Dressing/Undressing Upper body dressing   What is the patient wearing?: Pull over shirt    Upper body assist Assist Level: Set up  assist    Lower Body Dressing/Undressing Lower body dressing      What is the patient wearing?: Underwear/pull up, Pants     Lower body assist Assist for lower body dressing: Minimal Assistance - Patient > 75%     Toileting Toileting    Toileting assist Assist for toileting: Contact Guard/Touching assist     Transfers Chair/bed transfer  Transfers assist     Chair/bed transfer assist level: Minimal Assistance - Patient > 75%     Locomotion Ambulation   Ambulation assist      Assist level: Minimal Assistance - Patient > 75% Assistive device: No Device Max distance: 100'   Walk 10 feet  activity   Assist     Assist level: Minimal Assistance - Patient > 75% Assistive device: No Device   Walk 50 feet activity   Assist    Assist level: Minimal Assistance - Patient > 75% Assistive device: No Device    Walk 150 feet activity   Assist Walk 150 feet activity did not occur: Safety/medical concerns (fatigue)         Walk 10 feet on uneven surface  activity   Assist     Assist level: Minimal Assistance - Patient > 75% Assistive device: Other (comment) (Rt handrail)   Wheelchair     Assist Is the patient using a wheelchair?: Yes Type of Wheelchair: Manual    Wheelchair assist level: Dependent - Patient 0% Max wheelchair distance: 150'    Wheelchair 50 feet with 2 turns activity    Assist        Assist Level: Dependent - Patient 0%   Wheelchair 150 feet activity     Assist      Assist Level: Dependent - Patient 0%   Blood pressure 131/80, pulse 75, temperature (!) 97.5 F (36.4 C), resp. rate 17, height (P) 6' (1.829 m), SpO2 98%.  Medical Problem List and Plan: 1. 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  Antithrombotics: -DVT/anticoagulation:  Mechanical: Sequential compression devices, below knee Bilateral lower extremities             -antiplatelet therapy: N/A secondary to SDH--no injuries/fall per reports.  3. Pain Management: Tylenol prn with hydrocodone prn severe pain--->last used 04/02.    - 4/8: Reports of headache overnight, behind eyes, mostly when sitting upright.  Suspect orthostatic related, will treat as below.  Consider addition of Topamax if no improvement.  4-9: HA improved; monitor  4. Mood/Behavior/Sleep: LCSW to follow for evaluation and support.              -has hx of DDD/Panic  attacks, on wellbutrin XL 300mg  daily--resume             -antipsychotic agents: N/A             --insomnia: Reports sleeping well.  Melatonin prn.              -check sleep chart at least for tonight             -observe for mood lability  -4-8: Poor  sleep overnight secondary to spilling urinal; sleep log appropriate.  Monitor.  Sleep improved  5. Neuropsych/cognition: This patient is capable of making decisions on his own behalf.             -has hx of ADD--continue home straterra  6. Skin/Wound Care: Routine pressure relief measures.  7. Fluids/Electrolytes/Nutrition: Monitor I/O. No post op labs noted--will check in am.    - 4/8: Mild hyponatremia and elevated ALT; repeat in AM to trend.  Encourage p.o. fluids today.  Likely due to overdiuresis, but if no improvement with fluid intakes and holding diuretic, will get testing for SIADH consider normal saline bolus.  4-9: Sodium slightly improved, liver function tests normalizing.   Repeat 4/11  8. Hx Hydrocephalus s/p VPS/Seizure prophylaxis: On Keppra BID 9. HTN: Monitor BP TID--on hydrochlorothiazide/irbesartan              -DBP borderline today   - 4/8: BP stable overnight, orthostatic on exam.  DC hydrochlorothiazide, encourage p.o. and fluid intakes.  TED hose when out of bed.  - 4-9: Remains hypotensive today.  Will give 500 cc bolus of normal saline.  Encourage p.o. fluids.  - 4/10: Blood pressure much improved.  Encourage patient to drink at least 6-8 to 50 cc cups of water today.  He is agreeable.     03/06/2024    5:17 AM 03/05/2024    8:17 PM 03/05/2024    1:14 PM  Vitals with BMI  Systolic 131 117 161  Diastolic 80 83 75  Pulse 75 87 86    10. GERD: Continue Protonix 11. BPH?: Was on Flomax PTA.             -resume flomax -monitor voiding patterns  - No PVRs; continent all voids, frequency reasonable  12. Leukocytosis. WBC 17.5. No fevers, no obvious suorce of infection. Likely post-op; repeat in AM.     -4-9:  Improving to 12; no signs of infection.  Continue monitoring.  13. Constipation. LBM 4/6 per nursing.  -Colace 100 mg twice daily added    - LBM 4/8 large  LOS: 3 days A FACE TO FACE EVALUATION WAS PERFORMED  Angelina Sheriff 03/06/2024, 9:35 AM

## 2024-03-06 NOTE — Plan of Care (Signed)
  Problem: RH BOWEL ELIMINATION Goal: RH STG MANAGE BOWEL WITH ASSISTANCE Description: STG Manage Bowel with Mod I Assistance. Outcome: Progressing   Problem: RH BLADDER ELIMINATION Goal: RH STG MANAGE BLADDER WITH ASSISTANCE Description: STG Manage Bladder With  Mod I  Assistance Outcome: Progressing   Problem: RH SKIN INTEGRITY Goal: RH STG SKIN FREE OF INFECTION/BREAKDOWN Description: Manage skin free of infection/breakdown with mod I assistance Outcome: Progressing   Problem: RH SAFETY Goal: RH STG ADHERE TO SAFETY PRECAUTIONS W/ASSISTANCE/DEVICE Description: STG Adhere to Safety Precautions With  mod I Assistance/Device. Outcome: Progressing   Problem: RH COGNITION-NURSING Goal: RH STG USES MEMORY AIDS/STRATEGIES W/ASSIST TO PROBLEM SOLVE Description: STG Uses Memory Aids/Strategies With  Mod I Assistance to Problem Solve. Outcome: Progressing   Problem: RH PAIN MANAGEMENT Goal: RH STG PAIN MANAGED AT OR BELOW PT'S PAIN GOAL Description: <4 w/ prns Outcome: Progressing   Problem: RH KNOWLEDGE DEFICIT BRAIN INJURY Goal: RH STG INCREASE KNOWLEDGE OF SELF CARE AFTER BRAIN INJURY Description: Manage increase knowledge of self care after brain injury with mod I assistance from wife using educational materials provided Outcome: Progressing

## 2024-03-06 NOTE — Plan of Care (Signed)
  Problem: RH BOWEL ELIMINATION Goal: RH STG MANAGE BOWEL WITH ASSISTANCE Description: STG Manage Bowel with Mod I Assistance. Outcome: Progressing   Problem: RH BLADDER ELIMINATION Goal: RH STG MANAGE BLADDER WITH ASSISTANCE Description: STG Manage Bladder With  Mod I  Assistance Outcome: Progressing   Problem: RH SKIN INTEGRITY Goal: RH STG SKIN FREE OF INFECTION/BREAKDOWN Description: Manage skin free of infection/breakdown with mod I assistance Outcome: Progressing   Problem: RH SAFETY Goal: RH STG ADHERE TO SAFETY PRECAUTIONS W/ASSISTANCE/DEVICE Description: STG Adhere to Safety Precautions With  mod I Assistance/Device. Outcome: Progressing

## 2024-03-06 NOTE — IPOC Note (Signed)
 Overall Plan of Care [facility]) Patient Details Name: Micheal Ayers MRN: [id number] DOB: Apr 03, 1961  Admitting Diagnosis: Subdural hematoma [facility])  Hospital Problems: Principal Problem:   Subdural hematoma (HCC)     Functional Problem List: Nursing Bowel, Endurance, Medication Management, Perception, Safety, Skin Integrity  PT Balance, Endurance, Safety  OT Balance, Cognition, Edema, Endurance, Pain, Safety  SLP Cognition  TR         Basic ADL's: OT Bathing, Dressing, Toileting     Advanced  ADL's: OT       Transfers: PT Bed Mobility, Bed to Chair, Car, Occupational psychologist, Research scientist (life sciences): PT Ambulation, Stairs     Additional Impairments: OT Fuctional Use of Upper Extremity  SLP Swallowing, Communication, Social Cognition expression Memory, Attention  TR      Anticipated Outcomes Item Anticipated Outcome  Self Feeding    Swallowing  independent   Basic self-care  Supervision  Toileting  Supervision   Bathroom Transfers Supervision  Bowel/Bladder  manage bowels with medication/ continent of bladder  Transfers  Supervision  Locomotion  Supervision  Communication  modi  Cognition  modi  Pain  <4 w/ prns  Safety/Judgment  manage safety with Mod I assistance   Therapy Plan: PT Intensity: Minimum of 1-2 x/day ,45 to 90 minutes PT Frequency: 5 out of 7 days PT Duration Estimated Length of Stay: 7-10 Days OT Intensity: Minimum of 1-2 x/day, 45 to 90 minutes OT Frequency: 5 out of 7 days OT Duration/Estimated Length of Stay: 7-10 days SLP Intensity: Minumum of 1-2 x/day, 30 to 90 minutes SLP Frequency: 1 to 3 out of 7 days SLP Duration/Estimated Length of Stay: 7-10 days   Team Interventions: Nursing Interventions Patient/Family Education, Medication Management, Bowel Management, Disease Management/Prevention, Pain Management, Discharge Planning, Skin Care/Wound Management  PT interventions Ambulation/gait training, Community  reintegration, Neuromuscular re-education, DME/adaptive equipment instruction, Psychosocial support, Stair training, UE/LE Strength taining/ROM, Warden/ranger, Discharge planning, Pain management, Functional electrical stimulation, Therapeutic Activities, Cognitive remediation/compensation, UE/LE Coordination activities, Functional mobility training, Patient/family education, Therapeutic Exercise  OT Interventions Balance/vestibular training, Cognitive remediation/compensation, Community reintegration, Discharge planning, Disease mangement/prevention, DME/adaptive equipment instruction, Neuromuscular re-education, Functional mobility training, Functional electrical stimulation, Pain management, Patient/family education, Psychosocial support, Splinting/orthotics, Skin care/wound managment, Self Care/advanced ADL retraining, Therapeutic Activities, Therapeutic Exercise, UE/LE Strength taining/ROM, Visual/perceptual remediation/compensation, UE/LE Coordination activities  SLP Interventions Cognitive remediation/compensation, Internal/external aids, Environmental controls, Speech/Language facilitation, Cueing hierarchy, Dysphagia/aspiration precaution training, Functional tasks, Patient/family education  TR Interventions    SW/CM Interventions Discharge Planning, Psychosocial Support, Patient/Family Education   Barriers to Discharge MD  Medical stability, Home enviroment access/loayout, Lack of/limited family support, and Insurance for SNF coverage  Nursing Decreased caregiver support, Home environment access/layout Discharge: House  Discharge Home Layout: Able to live on main level with bedroom/bathroom  Discharge Home Access: Stairs to enter  Entrance Stairs-Rails: Left  Entrance Stairs-Number of Steps: 2  PT      OT Home environment access/layout, Wound Care    SLP      SW Decreased caregiver support, Lack of/limited family support, Community education officer for SNF coverage     Team Discharge  Planning: Destination: PT-Home ,OT- Home , SLP-Home Projected Follow-up: PT-Outpatient PT, OT-  Home health OT, SLP-Outpatient SLP Projected Equipment Needs: PT-To be determined, OT- To be determined, SLP-None recommended by SLP Equipment Details: PT- , OT-  Patient/family involved in discharge planning: PT- Patient,  OT-Patient, SLP-Patient  MD ELOS: 7-10 days Medical Rehab Prognosis:  Good Assessment: The  patient has been admitted for CIR therapies with the diagnosis of 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 The team will be addressing functional mobility, strength, stamina, balance, safety, adaptive techniques and equipment, self-care, bowel and bladder mgt, patient and caregiver education. Goals have been set at Supervision. Anticipated discharge destination is home.       See Team Conference Notes for weekly updates to the plan of care

## 2024-03-06 NOTE — Progress Notes (Signed)
 Physical Therapy Session Note  Patient Details  Name: Micheal Ayers MRN: [id number] Date of Birth: [birth year]  Today's Date: 03/06/2024 PT Individual Time: 9528-4132 PT Individual Time Calculation (min): 55 min   Today's Date: 03/06/2024 PT Individual Time: 1302-1359 PT Individual Time Calculation (min): 57 min   Short Term Goals: Week 1:  PT Short Term Goal 1 (Week 1): STGs = LTGs  Skilled Therapeutic Interventions/Progress Updates:     1st Session: Pt received seated in [facility] and agrees to therapy. Reports slight pain behind his eyes. PT provides rest breaks as needed and RN provides pain meds at initiation of session. WC transport to gym for time management. BP assessed in sitting 121/84. Pt stands and BP assessed 124/85. Pt ambulates 175', initially with CGA but progressing to minA due to anterior bias and trunk lean. PT cues for increasing stride length to improve gait pattern and balance and promote increased consistency of heel strike at initial contact. PT has extended discussion with pt regarding concerns for discharge, as well as pt's desire to go back to work. Pt then ambulates additional x175' with light minA at trunk for stability, and improved trunk control and balance. WC transport back to room. Left seated in recliner with all needs within reach.   2nd Session: Pt received seated in recliner and agrees to therapy. No complaint of pain. Stand step transfer to [facility] with CGA and cues for positioning. WC transport to gym for time management. Pt completes Nustep for endurance training and reciprocal coordination training. Pt completes x15:00 at workload of 5 with average steps per minute ~55. PT provides cues for hand and foot placement and completing full available ROM.   Pt completes tasks with 5lb ankle weights to promote increased strengthening as well as increased balance challenge with standing activities. Pt ambulates 120' with minA at trunk, with typical stride lengths a  beginning of bout but fading to shortened stride lengths after initial 37' with pt noting fatigue. Following rest break, pt completes 2x20 foot taps on 4" step with minA, and cues for lateral weight shifting and clearance of foot over step with each rep. Pt ambulates x150' back to room with minA cues for lengthening strides and maintaining upright posture. Left seated with alarm intact and all needs within reach.   Therapy Documentation Precautions:  Precautions Precautions: Fall Precaution/Restrictions Comments: L crani, Monitor vitals Restrictions Weight Bearing Restrictions Per Provider Order: No   Therapy/Group: Individual Therapy  Beau Fanny, PT, DPT 03/06/2024, 3:48 PM

## 2024-03-07 DIAGNOSIS — S065XAA Traumatic subdural hemorrhage with loss of consciousness status unknown, initial encounter: Secondary | ICD-10-CM | POA: Diagnosis not present

## 2024-03-07 LAB — COMPREHENSIVE METABOLIC PANEL WITH GFR
ALT: 40 U/L (ref 0–44)
AST: 14 U/L — ABNORMAL LOW (ref 15–41)
Albumin: 2.7 g/dL — ABNORMAL LOW (ref 3.5–5.0)
Alkaline Phosphatase: 48 U/L (ref 38–126)
Anion gap: 7 (ref 5–15)
BUN: 19 mg/dL (ref 8–23)
CO2: 22 mmol/L (ref 22–32)
Calcium: 8.4 mg/dL — ABNORMAL LOW (ref 8.9–10.3)
Chloride: 104 mmol/L (ref 98–111)
Creatinine, Ser: 0.97 mg/dL (ref 0.61–1.24)
GFR, Estimated: >60 mL/min (ref >60)
Glucose, Bld: 110 mg/dL — ABNORMAL HIGH (ref 70–99)
Potassium: 4.3 mmol/L (ref 3.5–5.1)
Sodium: 133 mmol/L — ABNORMAL LOW (ref 135–145)
Total Bilirubin: 1 mg/dL (ref 0.0–1.2)
Total Protein: 5.4 g/dL — ABNORMAL LOW (ref 6.5–8.1)

## 2024-03-07 NOTE — Progress Notes (Addendum)
 PROGRESS NOTE   Subjective/Complaints:  No acute complaints.  No events overnight. Further symptoms of orthostasis.  Blood pressures remained somewhat soft but improved from prior, other vital stable.  Labs this a.m. with stable mild hyponatremia, BUN/creatinine improved, LFTs normalized.  10 years with intermittent headache, 1 out of 10, improved with Tylenol.  Does not want additional treatment at this time.  ROS: Denies fevers, chills, N/V, abdominal pain, constipation, diarrhea, SOB, cough, chest pain, new weakness or paraesthesias.   + Headaches--minimal, improving   Objective:   No results found. Recent Labs    03/05/24 0511  WBC 12.1*  HGB 14.8  HCT 43.0  PLT 217   Recent Labs    03/05/24 0511 03/07/24 0618  NA 133* 133*  K 3.8 4.3  CL 102 104  CO2 24 22  GLUCOSE 130* 110*  BUN 31* 19  CREATININE 1.05 0.97  CALCIUM 8.1* 8.4*    Intake/Output Summary (Last 24 hours) at 03/07/2024 1241 Last data filed at 03/07/2024 0758 Gross per 24 hour  Intake 840 ml  Output --  Net 840 ml        Physical Exam: Vital Signs Blood pressure 111/78, pulse 85, temperature 97.8 F (36.6 C), resp. rate 18, height (P) 6' (1.829 m), SpO2 95%. Constitutional: No apparent distress. Appropriate appearance for age.  Sitting up in bedside chair. HENT: No JVD. Neck Supple. Trachea midline. Atraumatic, normocephalic. Eyes: PERRLA. EOMI. Visual fields grossly intact.  Cardiovascular: RRR, no murmurs/rub/gallops. No Edema.  TED hose donned. peripheral pulses 2+  Respiratory: CTAB. No rales, rhonchi, or wheezing. On RA.  Abdomen: + bowel sounds, normoactive. No distention or tenderness.  Skin: C/D/I. No apparent lesions. + PIV, intact  MSK:      No apparent deformity.   Neurologic exam:  Cognition: AAO to person, place, time and event.  Very mild higher-level cognitive deficits.  Some intermittent, rare emotional lability,  but overall appropriate.  Good insight into current condition.  Sensation equal throughout.  5 out of 5 bilateral upper and lower extremities, with very mild right upper and lower extremity weakness.    Stable exam 4-11  Assessment/Plan: 1. Functional deficits which require 3+ hours per day of interdisciplinary therapy in a comprehensive inpatient rehab setting. Physiatrist is providing close team supervision and 24 hour management of active medical problems listed below. Physiatrist and rehab team continue to assess barriers to discharge/monitor patient progress toward functional and medical goals  Care Tool:  Bathing    Body parts bathed by patient: Right arm, Left arm, Chest, Abdomen, Front perineal area, Buttocks, Right upper leg, Left upper leg, Right lower leg, Left lower leg, Face         Bathing assist Assist Level: Contact Guard/Touching assist     Upper Body Dressing/Undressing Upper body dressing   What is the patient wearing?: Pull over shirt    Upper body assist Assist Level: Set up assist    Lower Body Dressing/Undressing Lower body dressing      What is the patient wearing?: Underwear/pull up, Pants     Lower body assist Assist for lower body dressing: Minimal Assistance - Patient > 75%  Toileting Toileting    Toileting assist Assist for toileting: Contact Guard/Touching assist     Transfers Chair/bed transfer  Transfers assist     Chair/bed transfer assist level: Minimal Assistance - Patient > 75%     Locomotion Ambulation   Ambulation assist      Assist level: Minimal Assistance - Patient > 75% Assistive device: No Device Max distance: 140   Walk 10 feet activity   Assist     Assist level: Minimal Assistance - Patient > 75% Assistive device: No Device   Walk 50 feet activity   Assist    Assist level: Minimal Assistance - Patient > 75% Assistive device: No Device    Walk 150 feet activity   Assist Walk 150 feet  activity did not occur: Safety/medical concerns (fatigue)         Walk 10 feet on uneven surface  activity   Assist     Assist level: Minimal Assistance - Patient > 75% Assistive device: Other (comment) (Rt handrail)   Wheelchair     Assist Is the patient using a wheelchair?: Yes Type of Wheelchair: Manual    Wheelchair assist level: Dependent - Patient 0% Max wheelchair distance: 150'    Wheelchair 50 feet with 2 turns activity    Assist        Assist Level: Dependent - Patient 0%   Wheelchair 150 feet activity     Assist      Assist Level: Dependent - Patient 0%   Blood pressure 111/78, pulse 85, temperature 97.8 F (36.6 C), resp. rate 18, height (P) 6' (1.829 m), SpO2 95%.  Medical Problem List and Plan: 1. 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  Antithrombotics: -DVT/anticoagulation:  Mechanical: Sequential compression devices, below knee Bilateral lower extremities             -antiplatelet therapy: N/A secondary to SDH--no injuries/fall per reports.  3. Pain Management: Tylenol prn with hydrocodone prn severe pain--->last used 04/02.    - 4/8: Reports of headache overnight, behind eyes, mostly when sitting upright.  Suspect orthostatic related, will treat as below.  Consider addition of Topamax if no improvement.  4-9: HA improved; monitor--4-10 headache continues to be 1 out of 10, does not want additional treatment  4. Mood/Behavior/Sleep: LCSW to follow for evaluation and support.              -has hx of DDD/Panic attacks, on wellbutrin XL 300mg  daily--resume             -antipsychotic agents: N/A             --insomnia: Reports sleeping well.  Melatonin prn.              -check sleep chart at least for tonight              -observe for mood lability  -4-8: Poor sleep overnight secondary to spilling urinal; sleep log appropriate.  Monitor.  Sleep improved  5. Neuropsych/cognition: This patient is capable of making decisions on his own behalf.             -  has hx of ADD--continue home straterra  6. Skin/Wound Care: Routine pressure relief measures.   -11: DC peripheral IV 7. Fluids/Electrolytes/Nutrition: Monitor I/O. No post op labs noted--will check in am.    - 4/8: Mild hyponatremia and elevated ALT; repeat in AM to trend.  Encourage p.o. fluids today.  Likely due to overdiuresis, but if no improvement with fluid intakes and holding diuretic, will get testing for SIADH consider normal saline bolus.  4-9: Sodium slightly improved, liver function tests normalizing.   4-11: Stable NA 133, liver function test normalized.  Can resume weekly monitoring.  8. Hx Hydrocephalus s/p VPS/Seizure prophylaxis: On Keppra BID  -Cognitively stable, no further seizures 9. HTN: Monitor BP TID--on hydrochlorothiazide/irbesartan              -DBP borderline today   - 4/8: BP stable overnight, orthostatic on exam.  DC hydrochlorothiazide, encourage p.o. and fluid intakes.  TED hose when out of bed.  - 4-9: Remains hypotensive today.  Will give 500 cc bolus of normal saline.  Encourage p.o. fluids.  - 4/10: Blood pressure much improved.  Encourage patient to drink at least 6-8 to 50 cc cups of water today.  He is agreeable.  4-11: Blood pressure soft but improved, no further symptomatic orthostasis, BUN/creatinine normalized.     03/07/2024    5:41 AM 03/06/2024    7:39 PM 03/06/2024    2:13 PM  Vitals with BMI  Systolic 111 123 161  Diastolic 78 90 93  Pulse 85 92 90    10. GERD: Continue Protonix 11. BPH?: Was on Flomax PTA.             -resume flomax -monitor voiding patterns  - No PVRs; continent all voids, frequency reasonable  12. Leukocytosis. WBC 17.5. No fevers, no obvious suorce of infection. Likely post-op;  repeat in AM.     -4-9: Improving to 12; no signs of infection.  Continue monitoring.  13. Constipation.  Resolved.  -Colace 100 mg twice daily added    - LBM 4/10, medium  LOS: 4 days A FACE TO FACE EVALUATION WAS PERFORMED  Angelina Sheriff 03/07/2024, 12:41 PM

## 2024-03-07 NOTE — Plan of Care (Signed)
  Problem: Consults Goal: RH BRAIN INJURY PATIENT EDUCATION Description: Description: See Patient Education module for eduction specifics Outcome: Progressing   Problem: RH BOWEL ELIMINATION Goal: RH STG MANAGE BOWEL WITH ASSISTANCE Description: STG Manage Bowel with Mod I Assistance. Outcome: Progressing   Problem: RH BLADDER ELIMINATION Goal: RH STG MANAGE BLADDER WITH ASSISTANCE Description: STG Manage Bladder With  Mod I  Assistance Outcome: Progressing   Problem: RH SKIN INTEGRITY Goal: RH STG SKIN FREE OF INFECTION/BREAKDOWN Description: Manage skin free of infection/breakdown with mod I assistance Outcome: Progressing   Problem: RH SAFETY Goal: RH STG ADHERE TO SAFETY PRECAUTIONS W/ASSISTANCE/DEVICE Description: STG Adhere to Safety Precautions With  mod I Assistance/Device. Outcome: Progressing   Problem: RH COGNITION-NURSING Goal: RH STG USES MEMORY AIDS/STRATEGIES W/ASSIST TO PROBLEM SOLVE Description: STG Uses Memory Aids/Strategies With  Mod I Assistance to Problem Solve. Outcome: Progressing Flowsheets (Taken 03/07/2024 1754) STG: Uses memory aids/strategies with assistance: 5-Supervision/cueing Note: Short term memory impairment. Cues reminders needed.    Problem: RH PAIN MANAGEMENT Goal: RH STG PAIN MANAGED AT OR BELOW PT'S PAIN GOAL Description: <4 w/ prns Outcome: Progressing   Problem: RH KNOWLEDGE DEFICIT BRAIN INJURY Goal: RH STG INCREASE KNOWLEDGE OF SELF CARE AFTER BRAIN INJURY Description: Manage increase knowledge of self care after brain injury with mod I assistance from wife using educational materials provided Outcome: Progressing

## 2024-03-07 NOTE — Progress Notes (Addendum)
 Speech Language Pathology Daily Session Note  Patient Details  Name: Micheal Ayers MRN: [id number] Date of Birth: 29-Apr-1961  Today's Date: 03/07/2024 SLP Individual Time: 0915-1000 SLP Individual Time Calculation (min): 45 min  Short Term Goals: Week 1: SLP Short Term Goal 1 (Week 1): STGs = LTGs d/t ELOS  Skilled Therapeutic Interventions:   Pt greeted in his room; awake/alert in his recliner upon SLP arrival. He was very pleasant and cooperative throughout tx tasks targeting cognition. He initially completed complex organization task targeting attention to detail and information processing. He required only cue x1 for attention to detail. He then initiated complex problem solving/planning task re dinner menu. He completed the task thus far @ modI overall and required additional time to ID item availability and price online. However, this was anticipated given pt report of never utilizing online grocery services. Unable to complete task d/t time constraints, will continue in upcoming tx session as schedule allows. He was left in his chair with the alarm set and call light within reach. Recommend cont ST per POC.   Pain  "Mild pain" behind the eyes. Politely declined any pain interventions.   Therapy/Group: Individual Therapy  Pati Gallo 03/07/2024, 9:59 AM

## 2024-03-07 NOTE — Progress Notes (Signed)
 Occupational Therapy Session Note  Patient Details  Name: JOHNATAN BASKETTE MRN: [id number] Date of Birth: [birth year]  Session 1 Today's Date: 03/07/2024 OT Individual Time: 2130-8657 OT Individual Time Calculation (min): 19 min   Session 2 Today's Date: 03/07/2024 OT Individual Time: 8469-6295 OT Individual Time Calculation (min): 42 min    Short Term Goals: Week 1:  OT Short Term Goal 1 (Week 1): STGs=LTGs due to patient's ELOS.  Skilled Therapeutic Interventions/Progress Updates:    Session 1 Pt received sitting in the recliner with no c/o pain, agreeable to OT session.  Session focused on progression of functional mobility to address dynamic standing balance and functional activity tolerance for carryover to ADLs. He stood from the w/c, BP assessed and WNL. He completed 150 ft with no device with rest break, then 150 ft back to his room. Provided cueing for widening BOS and pelvis rotation forward. He required CGA initially, min A as he fatigued. Pt was left sitting up in the recliner with all needs met, chair alarm set, and call bell within reach.   Session 2 Pt received seated in the recliner with no c/o pain, still agreeable to take shower this session. Visitor present initially as OT gathered items for shower. He stood from recliner and with HHA completed functional mobility into the bathroom with CGA. Min cueing for sequencing to the TTB in the shower. He required min A to doff compression socks but was able to remove pants and underwear in standing and in sitting with CGA using grab bar. BP assessed and WNL. Bathing completed with grab bar support with CGA for balance but no further assist. He transferred back to the recliner, reporting some increase in dizziness. Discussed BP support and fall risk reduction strategies at home. He donned a shirt with set up assist, pants with mod A 2/2 time constraints. Pt was left sitting up in the recliner with all needs met, chair alarm set, and  call bell within reach.     Therapy Documentation Precautions:  Precautions Precautions: Fall Precaution/Restrictions Comments: L crani, Monitor vitals Restrictions Weight Bearing Restrictions Per Provider Order: No  Therapy/Group: Individual Therapy  [name] 03/07/2024, 6:16 AM

## 2024-03-07 NOTE — Progress Notes (Signed)
 Physical Therapy Session Note  Patient Details  Name: Micheal Ayers MRN: [id number] Date of Birth: 1960-12-30  Today's Date: 03/07/2024 PT Individual Time: 4132-4401 and 1431-1530 PT Individual Time Calculation (min): 45 min and 59 min.  Short Term Goals: Week 1:  PT Short Term Goal 1 (Week 1): STGs = LTGs  Skilled Therapeutic Interventions/Progress Updates:   First session:  Pt presents sitting EOB, having just finished breakfast and agreeable to therapy.  Pt has no c/o dizziness, TED hose donned and BP at 117/91.  Pt transferred to standing w/ CGA and BP monitored at 115/72.  Pt amb to sink to brush hair and teeth.  Pt amb 140' to main gym w/ min A and cues for posture, arm swing and step length.  Pt performed sit to stand blocks of 3 x 5-8 w/ min A and cues.  Pt performs standing toe taps to 6" platform w/ manual cues for weight shift .  Pt also performed alternating step-ups w/ min A.  Pt amb to room and remained sitting in recliner w/ seat alarm on and all needs in reach.  Second session:  Pt presents sitting in recliner and agreeable to therapy.  Pt states increased fatigue, especially after shower.  Pt transfers sit to stand throughout session w/ CGA.  Pt cued to stand before beginning gait.  Pt amb at least 140' w/o AD but noted toe catch and decreased control.  Pt performed multiple trials of marching forward and stepping backwards w/ cues for BOS and step length backwards w/ LLE.  Pt performed sidestepping w/o LOB.  Pt performed sidestepping over cane and then onto [employer] cushion for horseshoe toss reaching across midline.  Pt negotiated 4 steps w/ R rail and min A, reciprocal gait pattern.  Pt returned to room and remained sitting in recliner w/ legs elevated, seat alarm on and all needs in reach.      Therapy Documentation Precautions:  Precautions Precautions: Fall Precaution/Restrictions Comments: L crani, Monitor vitals Restrictions Weight Bearing Restrictions Per Provider  Order: No General:   Vital Signs: Therapy Vitals Temp: 97.8 F (36.6 C) Pulse Rate: 85 Resp: 18 BP: 111/78 Patient Position (if appropriate): Lying Oxygen Therapy SpO2: 95 % O2 Device: Room Air Pain:1/10 behind eyes, "minimal" pain      Therapy/Group: Individual Therapy  Lucio Edward 03/07/2024, 8:47 AM

## 2024-03-07 NOTE — Plan of Care (Signed)
  Problem: Consults Goal: RH BRAIN INJURY PATIENT EDUCATION Description: Description: See Patient Education module for eduction specifics Outcome: Progressing   Problem: RH BOWEL ELIMINATION Goal: RH STG MANAGE BOWEL WITH ASSISTANCE Description: STG Manage Bowel with Mod I Assistance. Outcome: Progressing   Problem: RH BLADDER ELIMINATION Goal: RH STG MANAGE BLADDER WITH ASSISTANCE Description: STG Manage Bladder With  Mod I  Assistance Outcome: Progressing   Problem: RH SAFETY Goal: RH STG ADHERE TO SAFETY PRECAUTIONS W/ASSISTANCE/DEVICE Description: STG Adhere to Safety Precautions With  mod I Assistance/Device. Outcome: Progressing

## 2024-03-08 DIAGNOSIS — S065XAA Traumatic subdural hemorrhage with loss of consciousness status unknown, initial encounter: Secondary | ICD-10-CM | POA: Diagnosis not present

## 2024-03-08 NOTE — Progress Notes (Signed)
 PROGRESS NOTE   Subjective/Complaints: Patient becomes emotional about his condition His sister and her boyfriend are visiting him today  ROS: Denies fevers, chills, N/V, abdominal pain, constipation, diarrhea, SOB, cough, chest pain, new weakness or paraesthesias.   + Headaches--minimal, improving   Objective:   No results found. No results for input(s): "WBC", "HGB", "HCT", "PLT" in the last 72 hours.  Recent Labs    03/07/24 0618  NA 133*  K 4.3  CL 104  CO2 22  GLUCOSE 110*  BUN 19  CREATININE 0.97  CALCIUM 8.4*    Intake/Output Summary (Last 24 hours) at 03/08/2024 1432 Last data filed at 03/08/2024 1337 Gross per 24 hour  Intake 417 ml  Output 400 ml  Net 17 ml        Physical Exam: Vital Signs Blood pressure 124/82, pulse 84, temperature 97.7 F (36.5 C), resp. rate 20, height (P) 6' (1.829 m), SpO2 92%. Constitutional: No apparent distress. Appropriate appearance for age.  Sitting up in bedside chair. HENT: No JVD. Neck Supple. Trachea midline. Atraumatic, normocephalic. Eyes: PERRLA. EOMI. Visual fields grossly intact.  Cardiovascular: RRR, no murmurs/rub/gallops. No Edema.  TED hose donned. peripheral pulses 2+  Respiratory: CTAB. No rales, rhonchi, or wheezing. On RA.  Abdomen: + bowel sounds, normoactive. No distention or tenderness.  Skin: C/D/I. No apparent lesions. + PIV, intact  MSK:      No apparent deformity.   Neurologic exam:  Cognition: AAO to person, place, time and event.  Very mild higher-level cognitive deficits.  Some intermittent, rare emotional lability, but overall appropriate.  Good insight into current condition.  Sensation equal throughout.  5 out of 5 bilateral upper and lower extremities, with very mild right upper and lower extremity weakness.   Stable 4/12  Assessment/Plan: 1. Functional deficits which require 3+ hours per day of interdisciplinary therapy in a  comprehensive inpatient rehab setting. Physiatrist is providing close team supervision and 24 hour management of active medical problems listed below. Physiatrist and rehab team continue to assess barriers to discharge/monitor patient progress toward functional and medical goals  Care Tool:  Bathing    Body parts bathed by patient: Right arm, Left arm, Chest, Abdomen, Front perineal area, Buttocks, Right upper leg, Left upper leg, Right lower leg, Left lower leg, Face         Bathing assist Assist Level: Contact Guard/Touching assist     Upper Body Dressing/Undressing Upper body dressing   What is the patient wearing?: Pull over shirt    Upper body assist Assist Level: Set up assist    Lower Body Dressing/Undressing Lower body dressing      What is the patient wearing?: Underwear/pull up, Pants     Lower body assist Assist for lower body dressing: Minimal Assistance - Patient > 75%     Toileting Toileting    Toileting assist Assist for toileting: Contact Guard/Touching assist     Transfers Chair/bed transfer  Transfers assist     Chair/bed transfer assist level: Minimal Assistance - Patient > 75%     Locomotion Ambulation   Ambulation assist      Assist level: Minimal Assistance - Patient > 75% Assistive  device: No Device Max distance: 140   Walk 10 feet activity   Assist     Assist level: Minimal Assistance - Patient > 75% Assistive device: No Device   Walk 50 feet activity   Assist    Assist level: Minimal Assistance - Patient > 75% Assistive device: No Device    Walk 150 feet activity   Assist Walk 150 feet activity did not occur: Safety/medical concerns (fatigue)         Walk 10 feet on uneven surface  activity   Assist     Assist level: Minimal Assistance - Patient > 75% Assistive device: Other (comment) (Rt handrail)   Wheelchair     Assist Is the patient using a wheelchair?: Yes Type of Wheelchair: Manual     Wheelchair assist level: Dependent - Patient 0% Max wheelchair distance: 150'    Wheelchair 50 feet with 2 turns activity    Assist        Assist Level: Dependent - Patient 0%   Wheelchair 150 feet activity     Assist      Assist Level: Dependent - Patient 0%   Blood pressure 124/82, pulse 84, temperature 97.7 F (36.5 C), resp. rate 20, height (P) 6' (1.829 m), SpO2 92%.  Medical Problem List and Plan: 1. 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  Antithrombotics: -DVT/anticoagulation:  Mechanical: Sequential compression devices, below knee Bilateral lower extremities             -antiplatelet therapy: N/A secondary to SDH--no injuries/fall per reports.  3. Pain Management: Tylenol prn with hydrocodone prn severe pain--->last used 04/02.    - 4/8: Reports of headache overnight, behind eyes, mostly when sitting upright.  Suspect orthostatic related, will treat as below.  Consider addition of Topamax if no improvement.  4-9: HA improved; monitor--4-10 headache continues to be 1 out of 10, does not want additional treatment  4. Mood/Behavior/Sleep: LCSW to follow for evaluation and support.              -has hx of DDD/Panic attacks, on wellbutrin XL 300mg  daily--resume             -antipsychotic agents: N/A             --insomnia: Reports sleeping well.  Melatonin prn.              -check sleep chart at least for tonight             -observe for mood lability  -4-8: Poor sleep overnight secondary to spilling urinal; sleep log appropriate.  Monitor.  Sleep improved  5. Neuropsych/cognition: This patient is capable of making decisions on his own behalf.             -has hx of ADD--continue home straterra  6. Skin/Wound Care: Routine pressure relief  measures.   -11: DC peripheral IV 7. Fluids/Electrolytes/Nutrition: Monitor I/O. No post op labs noted--will check in am.    - 4/8: Mild hyponatremia and elevated ALT; repeat in AM to trend.  Encourage p.o. fluids today.  Likely  due to overdiuresis, but if no improvement with fluid intakes and holding diuretic, will get testing for SIADH consider normal saline bolus.  4-9: Sodium slightly improved, liver function tests normalizing.   4-11: Stable NA 133, liver function test normalized.  Can resume weekly monitoring.  8. Hx Hydrocephalus s/p VPS/Seizure prophylaxis: On Keppra BID  -Cognitively stable, no further seizures 9. HTN: Monitor BP TID--on hydrochlorothiazide/irbesartan              -DBP borderline today   - 4/8: BP stable overnight, orthostatic on exam.  DC hydrochlorothiazide, encourage p.o. and fluid intakes.  TED hose when out of bed.  - 4-9: Remains hypotensive today.  Will give 500 cc bolus of normal saline.  Encourage p.o. fluids.  - 4/10: Blood pressure much improved.  Encourage patient to drink at least 6-8 to 50 cc cups of water today.  He is agreeable.  4-11: Blood pressure soft but improved, no further symptomatic orthostasis, BUN/creatinine normalized.     03/08/2024    2:25 PM 03/08/2024    3:46 AM 03/07/2024    8:11 PM  Vitals with BMI  Systolic 124 121 742  Diastolic 82 84 91  Pulse 84 77 85    10. GERD: continue Protonix  11. BPH?: continue flomax -monitor voiding patterns  - No PVRs; continent all voids, frequency reasonable  12. Leukocytosis. WBC 17.5. No fevers, no obvious suorce of infection. Likely post-op; repeat in AM.     -4-9: Improving to 12; no signs of infection.  continue monitoring.  13. Constipation.  Resolved.  -Colace 100 mg twice daily added    - last documented BM 4/10, messaged nursing to find out when last actual BM was  LOS: 5 days A FACE TO FACE EVALUATION WAS PERFORMED  Keven Pel Vernie Vinciguerra 03/08/2024, 2:32 PM

## 2024-03-08 NOTE — Plan of Care (Signed)
  Problem: RH BOWEL ELIMINATION Goal: RH STG MANAGE BOWEL WITH ASSISTANCE Description: STG Manage Bowel with Mod I Assistance. Outcome: Progressing   Problem: RH BLADDER ELIMINATION Goal: RH STG MANAGE BLADDER WITH ASSISTANCE Description: STG Manage Bladder With  Mod I  Assistance Outcome: Progressing   Problem: RH SKIN INTEGRITY Goal: RH STG SKIN FREE OF INFECTION/BREAKDOWN Description: Manage skin free of infection/breakdown with mod I assistance Outcome: Progressing   Problem: RH SAFETY Goal: RH STG ADHERE TO SAFETY PRECAUTIONS W/ASSISTANCE/DEVICE Description: STG Adhere to Safety Precautions With  mod I Assistance/Device. Outcome: Progressing

## 2024-03-09 DIAGNOSIS — S065XAA Traumatic subdural hemorrhage with loss of consciousness status unknown, initial encounter: Secondary | ICD-10-CM | POA: Diagnosis not present

## 2024-03-09 NOTE — Progress Notes (Addendum)
 Physical Therapy Session Note  Patient Details  Name: Micheal Ayers MRN: [id number] Date of Birth: 08-20-1961  Today's Date: 03/09/2024 PT Individual Time: 1050-1200 PT Individual Time Calculation (min): 70 min   Short Term Goals: Week 1:  PT Short Term Goal 1 (Week 1): STGs = LTGs  Skilled Therapeutic Interventions/Progress Updates: Pt presents sitting in recliner and agreeable to therapy w/ extra time.  Pt transfers sit to stand w/ CGA and amb to main gym w/ improved foot clearance although as fatigues L foot tends to drag.  Discussed w/ pt need for rest breaks as notices increased foot drag for safety.  Pt amb w/ R shoulder pulled back and able to correct w/ cues, yellow T-band used for tactile cues.  Pt amb w/ min/CGA and given 3 step directions and completed 3/3 times, although will ask for clarification when reaches next direction (turn in this door).  Pt somewhat emotional when asking about finances and what to do after D/C, and message sent to SW as well as request for neuropsych consult.  Pt performed floor ladder x multiple trials w/ min A and cues for reciprocal gait pattern, occasional LOB to L.  Pt performed Nu-step at Level 4 6' x 2 w/ seated rest break between.  Pt performed at ~ 60 spm for total of 695 steps.  Pt returned to room and into BR w/ 1 stumble as approaches toilet, continent of bladder w/ steadying assist, charted in Flowsheets.  Pt washed hands at sink and returned to sitting in recliner w/ seat alarm on and all needs in reach.     Therapy Documentation Precautions:  Precautions Precautions: Fall Precaution/Restrictions Comments: L crani, Monitor vitals Restrictions Weight Bearing Restrictions Per Provider Order: No General:   Vital Signs:   Pain:3/10 HA behind eyes. Pain Assessment Pain Scale: 0-10 Pain Score: 0-No pain    Therapy/Group: Individual Therapy  Marvel Sapp P Chalyn Amescua 03/09/2024, 12:09 PM

## 2024-03-09 NOTE — Progress Notes (Signed)
 PROGRESS NOTE   Subjective/Complaints: No complaints Headache more intense today- received medication from nursing, discussed that blood resorption can cause this pain  ROS: Denies fevers, chills, N/V, abdominal pain, constipation, diarrhea, SOB, cough, chest pain, new weakness or paraesthesias.   + Headaches--increased today   Objective:   No results found. No results for input(s): "WBC", "HGB", "HCT", "PLT" in the last 72 hours.  Recent Labs    03/07/24 0618  NA 133*  K 4.3  CL 104  CO2 22  GLUCOSE 110*  BUN 19  CREATININE 0.97  CALCIUM 8.4*    Intake/Output Summary (Last 24 hours) at 03/09/2024 1545 Last data filed at 03/09/2024 1423 Gross per 24 hour  Intake 590 ml  Output --  Net 590 ml        Physical Exam: Vital Signs Blood pressure (!) 143/86, pulse 86, temperature 97.8 F (36.6 C), resp. rate 17, height (P) 6' (1.829 m), SpO2 98%. Constitutional: No apparent distress. Appropriate appearance for age.  Sitting up in bedside chair. HENT: No JVD. Neck Supple. Trachea midline. Atraumatic, normocephalic. Eyes: PERRLA. EOMI. Visual fields grossly intact.  Cardiovascular: RRR, no murmurs/rub/gallops. No Edema.  TED hose donned. peripheral pulses 2+  Respiratory: CTAB. No rales, rhonchi, or wheezing. On RA.  Abdomen: + bowel sounds, normoactive. No distention or tenderness.  Skin: C/D/I. No apparent lesions. + PIV, intact  MSK:      No apparent deformity.   Neurologic exam:  Cognition: AAO to person, place, time and event.  Very mild higher-level cognitive deficits.  Some intermittent, rare emotional lability, but overall appropriate.  Good insight into current condition.  Sensation equal throughout.  5 out of 5 bilateral upper and lower extremities, with very mild right upper and lower extremity weakness.   Stable 4/13  Assessment/Plan: 1. Functional deficits which require 3+ hours per day of  interdisciplinary therapy in a comprehensive inpatient rehab setting. Physiatrist is providing close team supervision and 24 hour management of active medical problems listed below. Physiatrist and rehab team continue to assess barriers to discharge/monitor patient progress toward functional and medical goals  Care Tool:  Bathing    Body parts bathed by patient: Right arm, Left arm, Chest, Abdomen, Front perineal area, Buttocks, Right upper leg, Left upper leg, Right lower leg, Left lower leg, Face         Bathing assist Assist Level: Supervision/Verbal cueing     Upper Body Dressing/Undressing Upper body dressing   What is the patient wearing?: Pull over shirt    Upper body assist Assist Level: Supervision/Verbal cueing    Lower Body Dressing/Undressing Lower body dressing      What is the patient wearing?: Underwear/pull up, Pants     Lower body assist Assist for lower body dressing: Supervision/Verbal cueing     Toileting Toileting    Toileting assist Assist for toileting: Contact Guard/Touching assist (in standing for all steps)     Transfers Chair/bed transfer  Transfers assist     Chair/bed transfer assist level: Minimal Assistance - Patient > 75%     Locomotion Ambulation   Ambulation assist      Assist level: Minimal Assistance - Patient >  75% Assistive device: No Device Max distance: 160   Walk 10 feet activity   Assist     Assist level: Minimal Assistance - Patient > 75% Assistive device: No Device   Walk 50 feet activity   Assist    Assist level: Minimal Assistance - Patient > 75% Assistive device: No Device    Walk 150 feet activity   Assist Walk 150 feet activity did not occur: Safety/medical concerns (fatigue)         Walk 10 feet on uneven surface  activity   Assist     Assist level: Minimal Assistance - Patient > 75% Assistive device: Other (comment) (Rt handrail)   Wheelchair     Assist Is the  patient using a wheelchair?: Yes Type of Wheelchair: Manual    Wheelchair assist level: Dependent - Patient 0% Max wheelchair distance: 150'    Wheelchair 50 feet with 2 turns activity    Assist        Assist Level: Dependent - Patient 0%   Wheelchair 150 feet activity     Assist      Assist Level: Dependent - Patient 0%   Blood pressure (!) 143/86, pulse 86, temperature 97.8 F (36.6 C), resp. rate 17, height (P) 6' (1.829 m), SpO2 98%.  Medical Problem List and Plan: 1. 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  Antithrombotics: -DVT/anticoagulation:  Mechanical: Sequential compression devices, below knee Bilateral lower extremities             -antiplatelet therapy: N/A secondary to SDH--no injuries/fall per reports.  3. Pain Management: Tylenol prn with hydrocodone prn severe pain--->last used 04/02.    - 4/8: Reports of headache overnight, behind eyes, mostly when sitting upright.  Suspect orthostatic related, will treat as below.  Consider addition of Topamax if no improvement.  4-9: HA improved; monitor--4-10 headache continues to be 1 out of 10, does not want additional treatment  4. Mood/Behavior/Sleep: LCSW to follow for evaluation and support.              -has hx of DDD/Panic attacks, on wellbutrin XL 300mg  daily--resume             -antipsychotic agents: N/A             --insomnia: Reports sleeping well.  Melatonin prn.              -check sleep chart at least for tonight             -observe for mood lability  -4-8: Poor sleep overnight secondary to spilling urinal; sleep log appropriate.  Monitor.  Sleep improved  5. Neuropsych/cognition: This patient is capable of making decisions on his own behalf.             -has hx of  ADD--continue home straterra  6. Skin/Wound Care: Routine pressure relief measures.   -11: DC peripheral IV 7. Fluids/Electrolytes/Nutrition: Monitor I/O. No post op labs noted--will check in am.    - 4/8: Mild hyponatremia and elevated ALT; repeat in AM to trend.  Encourage p.o. fluids  today.  Likely due to overdiuresis, but if no improvement with fluid intakes and holding diuretic, will get testing for SIADH consider normal saline bolus.  4-9: Sodium slightly improved, liver function tests normalizing.   4-11: Stable NA 133, liver function test normalized.  Can resume weekly monitoring.  8. Hx Hydrocephalus s/p VPS/Seizure prophylaxis: continue Keppra BID  -Cognitively stable, no further seizures  9. HTN: Monitor BP TID--continue hydrochlorothiazide/irbesartan              -DBP borderline today   - 4/8: BP stable overnight, orthostatic on exam.  DC hydrochlorothiazide, encourage p.o. and fluid intakes.  TED hose when out of bed.  - 4-9: Remains hypotensive today.  Will give 500 cc bolus of normal saline.  Encourage p.o. fluids.  - 4/10: Blood pressure much improved.  Encourage patient to drink at least 6-8 to 50 cc cups of water today.  He is agreeable.  4-11: Blood pressure soft but improved, no further symptomatic orthostasis, BUN/creatinine normalized.     03/09/2024    4:51 AM 03/08/2024    6:45 PM 03/08/2024    2:25 PM  Vitals with BMI  Systolic 143 122 272  Diastolic 86 82 82  Pulse 86 81 84    10. GERD: continue Protonix  11. BPH?: continue flomax -monitor voiding patterns  - No PVRs; continent all voids, frequency reasonable  12. Leukocytosis. WBC 17.5. No fevers, no obvious suorce of infection. Likely post-op; repeat in AM.     -4-9: Improving to 12; no signs of infection.  continue monitoring.  13. Constipation.  Resolved.  -Colace 100 mg twice daily added    - last documented BM 4/10, messaged nursing to find out when last actual BM was  LOS: 6 days A FACE TO  FACE EVALUATION WAS PERFORMED  Keven Pel Shaymus Eveleth 03/09/2024, 3:45 PM

## 2024-03-09 NOTE — Progress Notes (Signed)
 Continues to have difficulty with memory mainly recalling events of the day prior. Also, in need of verbal cues at times and prompts with various actions. Such as use of the walker when ambulating.  On 4/12 reported having no pain. This morning expressed pain and pressure with left eye has been bothersome the last three days. Rates pain at a "3". When assessing the top of his head reports pain in the occipital area upon palpation.

## 2024-03-09 NOTE — Progress Notes (Signed)
 Reports pressure and headache comes on at times when laying supine and sitting upright too quickly. Denies any pain at this time or having a headache at this time.

## 2024-03-09 NOTE — Progress Notes (Signed)
 Occupational Therapy Session Note  Patient Details  Name: Micheal Ayers MRN: [id number] Date of Birth: 23-Oct-1961  Today's Date: 03/09/2024 OT Individual Time: 8295-6213 OT Individual Time Calculation (min): 45 min    Short Term Goals: Week 1:  OT Short Term Goal 1 (Week 1): STGs=LTGs due to patient's ELOS.  Skilled Therapeutic Interventions/Progress Updates:    Patient seated in the recliner at the time of treatment. The pt indicated that he rested okay with no report of pain. The pt presented with a BP of 105/76 prior to activity, The pt went on to complete sit to stand 4x with rest break as needed, the pt required 2 rest breaks.  When coming from sit to stand , the  pt was instructed to pace himself to avoid adverse responses.The pt was also instructed in relaxation breathing to improve compliance during functional task performance.  The pt was able to demonstrate effective carryover of relaxation breathing. The pt went on to demonstrate UB exercises using a 3lb dowel for shld flexion, horizontal abduction, shld rotation and lg circles 2 sets of 10 with rest breaks as needed. The pt required 1 rest break with each exercise. At the close of the session, the pt remained in the recliner with the call light and bedside table within reach and all additional needs addressed.   Therapy Documentation Precautions:  Precautions Precautions: Fall Precaution/Restrictions Comments: L crani, Monitor vitals Restrictions Weight Bearing Restrictions Per Provider Order: No  Therapy/Group: Individual Therapy  Moises Ang 03/09/2024, 1:50 PM

## 2024-03-09 NOTE — Progress Notes (Signed)
 Speech Language Pathology Daily Session Note  Patient Details  Name: Micheal Ayers MRN: [id number] Date of Birth: [birth year]  Today's Date: 03/09/2024 SLP Individual Time: 4098-1191 SLP Individual Time Calculation (min): 40 min  Short Term Goals: Week 1: SLP Short Term Goal 1 (Week 1): STGs = LTGs d/t ELOS  Skilled Therapeutic Interventions:   Pt seen for skilled SLP intervention to address cognitive goals including attention, verbal sequencing and verbal processing. Discussed pt's job responsibilities in depth and verbally outlined process of working with a new customer. 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 Pt learned a novel card game involving sequencing from 1-10 with various other rules. He needed min verbal cues to recall x1 rule in particular, otherwise, completed task independently with ~90% accuracy. Pt left sitting up in chair with chair alarm set and call bell in reach. Continue SLP PoC.   Pain Pain Assessment Pain Scale: 0-10 Pain Score: 0-No pain  Therapy/Group: Individual Therapy  Leverette Read 03/09/2024, 9:44 AM

## 2024-03-09 NOTE — Progress Notes (Signed)
 Occupational Therapy Session Note  Patient Details  Name: Micheal Ayers MRN: [id number] Date of Birth: 08-Sep-1961  Today's Date: 03/09/2024 OT Individual Time: 4098-1191 OT Individual Time Calculation (min): 45 min    Short Term Goals: Week 1:  OT Short Term Goal 1 (Week 1): STGs=LTGs due to patient's ELOS.  Skilled Therapeutic Interventions/Progress Updates:    Pt received sitting on the side of the bed with RN administering meds. Pt reports not sleeping very well last night but can continue with therapies today. Pt BP was 115/63 sitting EOB without any orthosis. Pt ambulated to the bathroom with RW with contact guard and performed toileting and voiding in standing. Pt does demonstrate slow processing with all tasks and frequently reminds himself what is he is doing. Pt transitioned to the shower with RW with contact guard Practiced backing up with RW a lot in the session with extra time, reassurance and contact guard- pt feels this is hard and he constantly feels like when he is standing he is standing on a boat on the water. Pt showered with supervision but did rewash his arms, face and UB 3x in shower. Pt dressed sitting in the recliner with extra time for processing but didn't require cues for sequencing. PT does demonstrate self awareness that he requires more time to think and perform tasks. Pt stood at sink to perform tooth brushing with close supervision.   Pt left resting in the recliner with safety pad alarm on.   Therapy Documentation Precautions:  Precautions Precautions: Fall Precaution/Restrictions Comments: L crani, Monitor vitals Restrictions Weight Bearing Restrictions Per Provider Order: No  Pain: Pain Assessment Pain Scale: 0-10 Pain Score: 0-No pain  Other Treatments:     Therapy/Group: Individual Therapy  Henrene Locust [facility] 03/09/2024, 11:35 AM

## 2024-03-10 DIAGNOSIS — I69119 Unspecified symptoms and signs involving cognitive functions following nontraumatic intracerebral hemorrhage: Secondary | ICD-10-CM

## 2024-03-10 DIAGNOSIS — S065XAA Traumatic subdural hemorrhage with loss of consciousness status unknown, initial encounter: Secondary | ICD-10-CM | POA: Diagnosis not present

## 2024-03-10 MED ORDER — ACETAMINOPHEN 325 MG PO TABS: 650.0000 mg | ORAL_TABLET | ORAL | Status: AC | PRN

## 2024-03-10 MED ORDER — DOCUSATE SODIUM 100 MG PO CAPS
100.0000 mg | ORAL_CAPSULE | Freq: Once | ORAL | Status: AC
  Filled 2024-03-10: qty 1

## 2024-03-10 MED ORDER — SENNOSIDES-DOCUSATE SODIUM 8.6-50 MG PO TABS
2.0000 | ORAL_TABLET | Freq: Every day | ORAL | Status: DC
  Administered 2024-03-11 – 2024-03-13 (×3): 2 via ORAL
  Filled 2024-03-10 (×3): qty 2

## 2024-03-10 NOTE — Progress Notes (Signed)
 PROGRESS NOTE   Subjective/Complaints:  No acute complaints.  No events overnight.  Vitals are stable. Labs pending for tomorrow a.m. Used as needed Tylenol twice yesterday  ROS: Denies fevers, chills, N/V, abdominal pain, constipation, diarrhea, SOB, cough, chest pain, new weakness or paraesthesias.   + Headaches   Objective:   No results found. No results for input(s): "WBC", "HGB", "HCT", "PLT" in the last 72 hours.  No results for input(s): "NA", "K", "CL", "CO2", "GLUCOSE", "BUN", "CREATININE", "CALCIUM" in the last 72 hours.   Intake/Output Summary (Last 24 hours) at 03/10/2024 0744 Last data filed at 03/09/2024 2050 Gross per 24 hour  Intake 590 ml  Output 100 ml  Net 490 ml        Physical Exam: Vital Signs Blood pressure (!) 122/90, pulse 82, temperature 97.8 F (36.6 C), temperature source Oral, resp. rate 18, height (P) 6' (1.829 m), SpO2 95%. Constitutional: No apparent distress. Appropriate appearance for age.  Sitting up in bedside chair. HENT: No JVD. Neck Supple. Trachea midline. Atraumatic, normocephalic. Eyes: PERRLA. EOMI. Visual fields grossly intact.  Cardiovascular: RRR, no murmurs/rub/gallops. No Edema.  TED hose donned. peripheral pulses 2+  Respiratory: CTAB. No rales, rhonchi, or wheezing. On RA.  Abdomen: + bowel sounds, normoactive. No distention or tenderness.  Skin: C/D/I. No apparent lesions. + PIV, intact  MSK:      No apparent deformity.   Neurologic exam:  Cognition: AAO to person, place, time and event.  Very mild higher-level cognitive deficits.  Some intermittent, rare emotional lability, but overall appropriate.  Good insight into current condition.  Sensation equal throughout.  5 out of 5 bilateral upper and lower extremities, with very mild right upper and lower extremity weakness.      Assessment/Plan: 1. Functional deficits which require 3+ hours per day of  interdisciplinary therapy in a comprehensive inpatient rehab setting. Physiatrist is providing close team supervision and 24 hour management of active medical problems listed below. Physiatrist and rehab team continue to assess barriers to discharge/monitor patient progress toward functional and medical goals  Care Tool:  Bathing    Body parts bathed by patient: Right arm, Left arm, Chest, Abdomen, Front perineal area, Buttocks, Right upper leg, Left upper leg, Right lower leg, Left lower leg, Face         Bathing assist Assist Level: Supervision/Verbal cueing     Upper Body Dressing/Undressing Upper body dressing   What is the patient wearing?: Pull over shirt    Upper body assist Assist Level: Supervision/Verbal cueing    Lower Body Dressing/Undressing Lower body dressing      What is the patient wearing?: Underwear/pull up, Pants     Lower body assist Assist for lower body dressing: Supervision/Verbal cueing     Toileting Toileting    Toileting assist Assist for toileting: Contact Guard/Touching assist (in standing for all steps)     Transfers Chair/bed transfer  Transfers assist     Chair/bed transfer assist level: Minimal Assistance - Patient > 75%     Locomotion Ambulation   Ambulation assist      Assist level: Minimal Assistance - Patient > 75% Assistive device: No Device Max distance:  160   Walk 10 feet activity   Assist     Assist level: Minimal Assistance - Patient > 75% Assistive device: No Device   Walk 50 feet activity   Assist    Assist level: Minimal Assistance - Patient > 75% Assistive device: No Device    Walk 150 feet activity   Assist Walk 150 feet activity did not occur: Safety/medical concerns (fatigue)         Walk 10 feet on uneven surface  activity   Assist     Assist level: Minimal Assistance - Patient > 75% Assistive device: Other (comment) (Rt handrail)   Wheelchair     Assist Is the  patient using a wheelchair?: Yes Type of Wheelchair: Manual    Wheelchair assist level: Dependent - Patient 0% Max wheelchair distance: 150'    Wheelchair 50 feet with 2 turns activity    Assist        Assist Level: Dependent - Patient 0%   Wheelchair 150 feet activity     Assist      Assist Level: Dependent - Patient 0%   Blood pressure (!) 122/90, pulse 82, temperature 97.8 F (36.6 C), temperature source Oral, resp. rate 18, height (P) 6' (1.829 m), SpO2 95%.  Medical Problem List and Plan: 1. 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  Antithrombotics: -DVT/anticoagulation:  Mechanical: Sequential compression devices, below knee Bilateral lower extremities             -antiplatelet therapy: N/A secondary to SDH--no injuries/fall per reports.  3. Pain Management: Tylenol prn with hydrocodone prn severe pain--->last used 04/02.    - 4/8: Reports of headache overnight, behind eyes, mostly when sitting upright.  Suspect orthostatic related, will treat as below.  Consider addition of Topamax if no improvement.  4-9: HA improved; monitor- -4-10 headache continues to be 1 out of 10, does not want additional treatment  4. Mood/Behavior/Sleep: LCSW to follow for evaluation and support.              -has hx of DDD/Panic attacks, on wellbutrin XL 300mg  daily--resume             -antipsychotic agents: N/A             --insomnia: Reports sleeping well.  Melatonin prn.              -check sleep chart at least for tonight             -observe for mood lability  -4-8: Poor sleep overnight secondary to spilling urinal; sleep log appropriate.  Monitor.  Sleep improved  5. Neuropsych/cognition: This patient is capable of making decisions on his own behalf.              -has hx of ADD--continue home straterra  6. Skin/Wound Care: Routine pressure relief measures.   -11: DC peripheral IV 7. Fluids/Electrolytes/Nutrition: Monitor I/O. No post op labs noted--will check in am.    - 4/8: Mild hyponatremia and elevated ALT; repeat in AM to trend.  Encourage p.o. fluids today.  Likely  due to overdiuresis, but if no improvement with fluid intakes and holding diuretic, will get testing for SIADH consider normal saline bolus.  4-9: Sodium slightly improved, liver function tests normalizing.   4-11: Stable NA 133, liver function test normalized.  Can resume weekly monitoring.  8. Hx Hydrocephalus s/p VPS/Seizure prophylaxis: continue Keppra BID  -Cognitively stable, no further seizures  9. HTN: Monitor BP TID--continue hydrochlorothiazide/irbesartan              -DBP borderline today   - 4/8: BP stable overnight, orthostatic on exam.  DC hydrochlorothiazide, encourage p.o. and fluid intakes.  TED hose when out of bed.  - 4-9: Remains hypotensive today.  Will give 500 cc bolus of normal saline.  Encourage p.o. fluids.  - 4/10: Blood pressure much improved.  Encourage patient to drink at least 6-8 to 50 cc cups of water today.  He is agreeable.  4-11: Blood pressure soft but improved, no further symptomatic orthostasis, BUN/creatinine normalized.     03/10/2024    4:32 AM 03/09/2024    5:00 PM 03/09/2024    4:51 AM  Vitals with BMI  Systolic 122 112 784  Diastolic 90 83 86  Pulse 82 86 86    10. GERD: continue Protonix  11. BPH?: continue flomax -monitor voiding patterns  - No PVRs; continent all voids, frequency reasonable  12. Leukocytosis. WBC 17.5. No fevers, no obvious suorce of infection. Likely post-op; repeat in AM.     -4-9: Improving to 12; no signs of infection.  continue monitoring.  13. Constipation.  Resolved.  -Colace 100 mg twice daily added  LBM 4/11  LOS: 7 days A FACE TO FACE EVALUATION WAS PERFORMED  Bea Lime 03/10/2024,  7:44 AM

## 2024-03-10 NOTE — Progress Notes (Signed)
 Occupational Therapy Weekly Progress Note  Patient Details  Name: Micheal Ayers MRN: [id number] Date of Birth: 1961/07/05  Beginning of progress report period: March 04, 2024 End of progress report period: March 10, 2024  Today's Date: 03/10/2024 OT Individual Time: 4098-1191 OT Individual Time Calculation (min): 73 min   No short term goals set d/t ELOS. [name] is progressing well toward his OT goals. He is (S) for UB ADLs, CGA for LB ADLs. He requires CGA for ADL transfers.   Patient continues to demonstrate the following deficits: muscle weakness, decreased cardiorespiratoy endurance, decreased safety awareness and delayed processing, and decreased standing balance, decreased postural control, and decreased balance strategies and therefore will continue to benefit from skilled OT intervention to enhance overall performance with BADL and iADL.  Patient progressing toward long term goals..  Continue plan of care.  OT Short Term Goals Week 1:  OT Short Term Goal 1 (Week 1): STGs=LTGs due to patient's ELOS. OT Short Term Goal 1 - Progress (Week 1): Progressing toward goal Week 2:  OT Short Term Goal 1 (Week 2): STG= LTG d/t ELOS  Skilled Therapeutic Interventions/Progress Updates:    Pt received sitting in recliner with no c/o pain, agreeable to OT session. He discussed emotional struggles with adjustment and fear of financial future following hospitalization. OT provided emotional support, brain injury education, and will pass on request to CSW to provide any insight into financial resources. He came to standing with close (S) from recliner. Functional mobility into bathroom and into shower with CGA, no device. He required min cueing for safety awareness- to doff LB clothing in sitting. UB bathing seated with set up assist. LB standing with (S) using grab bar for support. He stood to dry off and OT noticed pt's face with increased pallor. When questioned pt stating "I feel like I'm going side  to side a bit". He sat down, OT assessed vitals- BP 131/94. He then transitioned to standing at the sink for oral care, following 4 min of standing BP re-assessed in standing and it was 129/85. He had no further s/s of OH. Discussed BP management at home at length. Also discussed home set up, supports, and activities that will require direct supervision for safety. He completed LB dressing with close (S). Shaving task seated with set up assist. UB dressing with set up. He then completed 100 ft functional mobility to the tub room with no device, CGA. He completed tub transfer using TTB following demonstration with CGA. Discussed use at home. He was agreeable. He returned to his room and was left sitting in the recliner with all needs met.    Therapy Documentation Precautions:  Precautions Precautions: Fall Precaution/Restrictions Comments: L crani, Monitor vitals Restrictions Weight Bearing Restrictions Per Provider Order: No  Therapy/Group: Individual Therapy  Una Ganser 03/10/2024, 6:30 AM

## 2024-03-10 NOTE — Plan of Care (Signed)
  Problem: RH Ambulation Goal: LTG Patient will ambulate in home environment (PT) Description: LTG: Patient will ambulate in home environment, # of feet with assistance (PT). Flowsheets (Taken 03/10/2024 0958) LTG: Pt will ambulate in home environ  assist needed:: Supervision/Verbal cueing

## 2024-03-10 NOTE — Progress Notes (Signed)
 Speech Language Pathology Daily Session Note  Patient Details  Name: Micheal Ayers MRN: [id number] Date of Birth: 1961-10-17  Today's Date: 03/10/2024 SLP Individual Time: 1445-1540 SLP Individual Time Calculation (min): 55 min  Short Term Goals: Week 1: SLP Short Term Goal 1 (Week 1): STGs = LTGs d/t ELOS  Skilled Therapeutic Interventions:   Pt greeted in his room. He was very pleasant and cooperative throughout tx tasks targeting cognition and specific word finding. He was able to recall therapies from this weekend with s cues. He then completed complex cognitive tasks targeting alternating and divided attention. He completed a map task and symbol matching task in conjunction w/ specific word finding tasks. He was able to maintain attention independently and required only s cues for specific word finding. He demonstrated notably improved processing speed as compared to 4/11. At the end of tx tasks, he was left in his recliner with the call light within reach. Recommend cont ST per POC.   Pain  No pain reported  Therapy/Group: Individual Therapy  [name] 03/10/2024, 4:00 PM

## 2024-03-10 NOTE — Progress Notes (Signed)
 Physical Therapy Session Note  Patient Details  Name: Micheal Ayers MRN: [id number] Date of Birth: [birth year]  Today's Date: 03/10/2024 PT Individual Time: 0905-1004 PT Individual Time Calculation (min): 59 min   Short Term Goals: Week 1:  PT Short Term Goal 1 (Week 1): STGs = LTGs  Skilled Therapeutic Interventions/Progress Updates:    Pt presents in room seated EOB, handoff from neuropsychologist, motivated to participate with PT. Pt reports pain in head 1/10 at this time, does not change throughout session. Session focused on gait training for tolerance to upright and foot clearance, stair negotiation, NMR for single limb stability and dynamic standing balance, and therapeutic activities to facilitate participation with self care tasks. Pt completes sit<>stand transfers with CGA throughout session.  Pt ambulates without device with CGA/min assist for turns ~175' to main gym and comes to sitting on EOM.  Pt completes NMR for single limb stability, bilateral weightshifting,  and dynamic standing balance on compliant and noncompliant surfaces without UE support including: - heel taps 4" step x20 alternating BLE - lateral step taps 4" step x10 BLE - step ups 4" step x10 BLE - sit<>stands x10 airex pad - standing mini squats x10 airex pad - standing marches x20 alternating BLE on airex pad - step ups onto airex pad x10 BLE  Pt completes 4x105' with 2# ankle weights donned bilaterally, cues for foot clearance and heel strike with pt tending to complete short step length and decreased step height. Completed to promtoe BLE muscular endurance and to improve gait mechanics.  Pt completes gait training with forward/backward 3x10' demonstrating good weightshifting and improved COG within BOS with forward ambulation following backwards ambulation.  Pt completes up/down 8 steps with RHR, CGA with cues for placing whole foot on step with ascending, pt completes reciprocally. Completed to simulate  home environment.  Pt returns to room, ambulates into bathroom and completes toilet transfer with CGA, 3/3 toileting tasks with supervision. Pt continent of BM and charted.  Pt completes hand hygiene with supervision standing at sink and completes ambulatory transfer with CGA to recliner. Pt remains seated in recliner with all needs within reach, cal light in place and chair alarm donned and activated at end of session.   Therapy Documentation Precautions:  Precautions Precautions: Fall Precaution/Restrictions Comments: L crani, Monitor vitals Restrictions Weight Bearing Restrictions Per Provider Order: No   Therapy/Group: Individual Therapy  Annia Kilts PT, DPT 03/10/2024, 10:07 AM

## 2024-03-11 DIAGNOSIS — I69119 Unspecified symptoms and signs involving cognitive functions following nontraumatic intracerebral hemorrhage: Secondary | ICD-10-CM

## 2024-03-11 DIAGNOSIS — S065XAA Traumatic subdural hemorrhage with loss of consciousness status unknown, initial encounter: Secondary | ICD-10-CM | POA: Diagnosis not present

## 2024-03-11 LAB — BASIC METABOLIC PANEL WITH GFR
Anion gap: 9 (ref 5–15)
BUN: 13 mg/dL (ref 8–23)
CO2: 23 mmol/L (ref 22–32)
Calcium: 8.2 mg/dL — ABNORMAL LOW (ref 8.9–10.3)
Chloride: 101 mmol/L (ref 98–111)
Creatinine, Ser: 1.02 mg/dL (ref 0.61–1.24)
GFR, Estimated: >60 mL/min (ref >60)
Glucose, Bld: 99 mg/dL (ref 70–99)
Potassium: 4.2 mmol/L (ref 3.5–5.1)
Sodium: 133 mmol/L — ABNORMAL LOW (ref 135–145)

## 2024-03-11 LAB — CBC WITH DIFFERENTIAL/PLATELET
Abs Immature Granulocytes: 0.05 10*3/uL (ref 0.00–0.07)
Basophils Absolute: 0 10*3/uL (ref 0.0–0.1)
Basophils Relative: 0 %
Eosinophils Absolute: 0.2 10*3/uL (ref 0.0–0.5)
Eosinophils Relative: 2 %
HCT: 41.5 % (ref 39.0–52.0)
Hemoglobin: 14 g/dL (ref 13.0–17.0)
Immature Granulocytes: 1 %
Lymphocytes Relative: 23 %
Lymphs Abs: 2.1 10*3/uL (ref 0.7–4.0)
MCH: 29.8 pg (ref 26.0–34.0)
MCHC: 33.7 g/dL (ref 30.0–36.0)
MCV: 88.3 fL (ref 80.0–100.0)
Monocytes Absolute: 0.9 10*3/uL (ref 0.1–1.0)
Monocytes Relative: 10 %
Neutro Abs: 6 10*3/uL (ref 1.7–7.7)
Neutrophils Relative %: 64 %
Platelets: 171 10*3/uL (ref 150–400)
RBC: 4.7 MIL/uL (ref 4.22–5.81)
RDW: 13.1 % (ref 11.5–15.5)
WBC: 9.3 10*3/uL (ref 4.0–10.5)
nRBC: 0 % (ref 0.0–0.2)

## 2024-03-11 NOTE — Progress Notes (Signed)
 Patient ID: Micheal Ayers, male   DOB: 12/07/1960, 63 y.o.   MRN: [id number]  SW made efforts to meet with pt in room but not present.   *SW later saw pt with OT. Reports pt will need a RW. Pt confirms he does not have.   1520- SW spoke with his wife Maudine Sos to discuss above, d/c date 4/17 and DME- RW and TTB. Pt wife reports he has a RW. States he will not need a bench as he will be using walk in shower downstairs. SW encouraged her to take pictures when she comes in for family edu tomorrow during scheduled times to discuss further in therapy.    Norval Been, MSW, LCSW Office: [phone] Cell: [phone] Fax: [phone]

## 2024-03-11 NOTE — Plan of Care (Signed)
  Problem: RH BOWEL ELIMINATION Goal: RH STG MANAGE BOWEL WITH ASSISTANCE Description: STG Manage Bowel with Mod I Assistance. Outcome: Progressing   Problem: RH BLADDER ELIMINATION Goal: RH STG MANAGE BLADDER WITH ASSISTANCE Description: STG Manage Bladder With  Mod I  Assistance Outcome: Progressing   Problem: RH SKIN INTEGRITY Goal: RH STG SKIN FREE OF INFECTION/BREAKDOWN Description: Manage skin free of infection/breakdown with mod I assistance Outcome: Progressing   Problem: RH SAFETY Goal: RH STG ADHERE TO SAFETY PRECAUTIONS W/ASSISTANCE/DEVICE Description: STG Adhere to Safety Precautions With  mod I Assistance/Device. Outcome: Progressing   Problem: RH PAIN MANAGEMENT Goal: RH STG PAIN MANAGED AT OR BELOW PT'S PAIN GOAL Description: <4 w/ prns Outcome: Progressing   Problem: RH KNOWLEDGE DEFICIT BRAIN INJURY Goal: RH STG INCREASE KNOWLEDGE OF SELF CARE AFTER BRAIN INJURY Description: Manage increase knowledge of self care after brain injury with mod I assistance from wife using educational materials provided Outcome: Progressing

## 2024-03-11 NOTE — Progress Notes (Signed)
 Speech Language Pathology Daily Session Note  Patient Details  Name: Micheal Ayers MRN: [id number] Date of Birth: [birth year]  Today's Date: 03/11/2024 SLP Individual Time: 0800-0900 SLP Individual Time Calculation (min): 60 min  Short Term Goals: Week 1: SLP Short Term Goal 1 (Week 1): STGs = LTGs d/t ELOS  Skilled Therapeutic Interventions:   Pt greeted at bedside. He was awake/alert upon SLP arrival with nursing present in the room providing morning meds. He was very pleasant and cooperative throughout tx tasks targeting cognition and dysphagia. He reported slightly improved success with breath/swallow coordination, but that intermittent coughing is still present during meals especially. SLP introduced EMST75 to target expiratory muscle strength training and improved airway protection. After initial demo from SLP, he was able to complete the protocol (5 reps of 5 - 25 reps total) @ 35 cmH2O. He initially benefited from s cues to ensure adequate timing and optimal technique, however, quickly improved to independent completion. He was then challenged to complete a complex cognitive task creating his own format to organize raw data. He completed the task at modI overall and demonstrated adequate information processing, attention, organization, and working memory. At the end of tx tasks, he was left in his recliner with the call light within reach. Recommend cont ST per POC.   Pain Pain Assessment Pain Scale: 0-10 Pain Score: 3  Pain Location: Eye Pain Intervention(s): Repositioned;Medication (See eMAR)  Therapy/Group: Individual Therapy  [name] 03/11/2024, 8:43 AM

## 2024-03-11 NOTE — Progress Notes (Signed)
 Physical Therapy Session Note  Patient Details  Name: Micheal Ayers MRN: [id number] Date of Birth: 1961/11/15  Today's Date: 03/11/2024 PT Individual Time: 1914-7829 PT Individual Time Calculation (min): 70 min   Short Term Goals: Week 1:  PT Short Term Goal 1 (Week 1): STGs = LTGs  Skilled Therapeutic Interventions/Progress Updates:   Received pt sitting on toilet. Pt required increased time to finish toileting but able to perform all toileting tasks without assist. Pt agreeable to PT treatment and reported pain 1-2/10 behind eyes. Session with emphasis on functional mobility/transfers, generalized strengthening and endurance, dynamic standing balance/coordination, NMR, and gait training. Pt performed all transfers without AD and CGA/close supervision throughout session. Pt ambulated out of bathroom without AD and close supervision. Donned shoes with set up assist, then ambulated additional 345ft without AD and CGA/light min A to dayroom.   Pt emotional regarding current medical state. Discussed prior appointment with Neuropsych and pt reported conversation was "over his head" - treatment team notified and MD planning to further discuss with pt. Provided emotional support and encouragement and pt appreciative. Pt then participated 6 Min Walk Test without AD:  Instructed patient to ambulate as quickly and as safely as possible for 6 minutes using LRAD. Patient was allowed to take standing rest breaks without stopping the test, but if the patient required a sitting rest break the clock would be stopped and the test would be over.  Results: 722.3 feet (220.1 meters, Avg speed 0.61 m/s) with CGA/min A. Results indicate that the patient has reduced endurance with ambulation compared to age matched norms.  Age Matched Norms: 37-69 yo M: 73 F: 11, 75-79 yo M: 79 F: 471, 49-89 yo M: 417 F: 392 MDC: 58.21 meters (190.98 feet) or 50 meters (ANPTA Core Set of Outcome Measures for Adults with  Neurologic Conditions, 2018)  Pt required cues to widen BOS, steady cadence (noted pt ambulating faster as fatigue set in), and land with "heel/toe" gait pattern to avoid shuffling. During last ~47ft, pt with with anterior lean/LOB requiring mod A to correct. Instructed pt to stop and "reset" when fatigue sets in. Ambulated to Nustep and performed seated BUE/BLE strengthening on Nustep at workload 6 with SPM >55 for 8 minutes for a total of 475 steps. Pt then ambulated >368ft without AD and light min A back to room - noted improvements in BOS, "heel to toe" gait pattern, and less shuffling. Concluded session with pt sitting in recliner, needs within reach, and chair pad alarm on.   Therapy Documentation Precautions:  Precautions Precautions: Fall Precaution/Restrictions Comments: L crani, Monitor vitals Restrictions Weight Bearing Restrictions Per Provider Order: No  Therapy/Group: Individual Therapy Nicolas Barren Zaunegger Nena Bank PT, DPT 03/11/2024, 6:52 AM

## 2024-03-11 NOTE — Patient Care Conference (Signed)
 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 Cherri Corns Social Worker Present: Norval Been, LCSWA Nurse Present: Jerene Monks, RN PT Present: Catilin Osborn, PT OT Present: Eilene Grater, OT SLP Present: Other (comment) Tally Faes , SLP) PPS Coordinator present : Jestine Moron, SLP     Current Status/Progress Goal Weekly Team Focus  Bowel/Bladder      Continent of bowel and bladder    Remain continent of bowel and bladder    Assess bowel and bladder q shift  Swallow/Nutrition/ Hydration   regular/thin   supervision  EMST75, pt/family edu    ADL's   UB ADLs at set up assist, LB close to (S), transfers at [facility]. [place] include need for supervision from wife and full flight of stairs to access his bedroom   Supervision   ADL, transfers, higher level cognitive retraining, balance, family edu    Mobility   bed mobility supervision,trasnsfers CGA, gait without deviceCGA/min assist 200'   supervision ambulatory  barriers: balance, awareness; focus on gait training without device,  NMR, stairs    Communication   modI   modI   specific word finding, thought formulation    Safety/Cognition/ Behavioral Observations  modI   modI   complex cognitive tasks targeting executive functioning    Pain     Pressure behind patient eye  causing headache    Remain pain free      Assess pain; <4 w/ prns  Skin      Incision on his head    Incision will heal with no complications    Assess incision q shift    Discharge Planning:  Pt wife reports that she will be available as much as needed, and can provide physical assistance. SW will confirm there are no barriers.    Team Discussion: Patient was admitted post craniotomy due to large SDH. Patient has a hydrocephalus s/p VP shunt. Patient has headache, orthostasis: medications adjusted by MD. Patient limited by emotional lability, depression, balance and awareness.   Patient on target to meet rehab goals: yes,  patient requires set up assist with upper body care . Patient requires supervision with lower body care and transfers with CGA. Patient is able to ambulate up to 200' without a device with CGA. Patient has a good endurance  but needs DME for support. Patient has some breathing-swallow difficulty and Mod I assist with cognition. Overall goals at discharge are set for supervision-Mod I.   *See Care Plan and progress notes for long and short-term goals.   Revisions to Treatment Plan:  Neuropsych consult  EMST75  Teaching Needs: Safety, medications, toileting,transfers, etc.    Current Barriers to Discharge: Decreased caregiver support  Possible Resolutions to Barriers: Family Education  DME: TTB Home health follow up     Medical Summary Current Status: medically complicated by mood/impulsive behaviors, hypertension, headaches,  hyperglycemia, hyponatremia, BPH  Barriers to Discharge: Behavior/Mood;Electrolyte abnormality;Medical stability;Uncontrolled Diabetes;Uncontrolled Pain;Self-care education   Possible Resolutions to [employer]: titrate pain medications to minimum tolerated doses for function, monitor vitals and increase BP regimen as approrpiate, establish sleep-wake cycle and behavioral plan   Continued Need for Acute Rehabilitation  Level of Care: The patient requires daily medical management by a physician with specialized training in physical medicine and rehabilitation for the following reasons: Direction of a multidisciplinary physical rehabilitation program to maximize functional independence : Yes Medical management of patient stability for increased activity during participation in an intensive rehabilitation regime.: Yes Analysis of laboratory values and/or radiology reports with any subsequent need for medication adjustment and/or medical intervention. : Yes   I attest that I was present, lead the team conference, and concur with the assessment and plan of the team.   Jerene Monks 03/11/2024, 1018 am

## 2024-03-11 NOTE — Progress Notes (Signed)
 Occupational Therapy Session Note  Patient Details  Name: Micheal Ayers MRN: [id number] Date of Birth: 09-10-1961  Today's Date: 03/11/2024 OT Individual Time: 1335-1435 OT Individual Time Calculation (min): 60 min    Short Term Goals: Week 2:  OT Short Term Goal 1 (Week 2): STG= LTG d/t ELOS  Skilled Therapeutic Interventions/Progress Updates:    Pt received sitting in the recliner with no c/o pain, agreeable to OT session. Initial focus of session on DME selection for functional mobility. Pt completing 100 ft with rollator, RW and hurry cane, respectively with rest breaks between. Provided recommendations/education on CLOF and fall risk with each device. Encouraged pt to express his comfort/safety and preferences with each device to aid in decision making. He was CGA - (S) with all devices. He and OT decided RW to be the safest selection for home use, provided extensive edu on carryover home for the max independence with ADLs and fall risk reduction. Pt returned to his room following and completed a shower. He required min cueing for sequencing transfer into shower. He completed all bathing at (S) level. He dressed with (S) following. Pt was left sitting up in the recliner with all needs met, chair alarm set, and call bell within reach.    Therapy Documentation Precautions:  Precautions Precautions: Fall Precaution/Restrictions Comments: L crani, Monitor vitals Restrictions Weight Bearing Restrictions Per Provider Order: No  Therapy/Group: Individual Therapy  Una Ganser 03/11/2024, 6:15 AM

## 2024-03-11 NOTE — Plan of Care (Signed)
   Problem: Consults Goal: RH BRAIN INJURY PATIENT EDUCATION Description: Description: See Patient Education module for eduction specifics Outcome: Progressing

## 2024-03-11 NOTE — Progress Notes (Signed)
 PROGRESS NOTE   Subjective/Complaints:  Reports very tearful this AM, wants to review what Dr. Cheryll Corti said yesterday because it was "over his head".   ROS: Denies fevers, chills, N/V, abdominal pain, constipation, diarrhea, SOB, cough, chest pain, new weakness or paraesthesias.   + Headaches  Objective:   No results found. Recent Labs    03/11/24 0523  WBC 9.3  HGB 14.0  HCT 41.5  PLT 171    Recent Labs    03/11/24 0523  NA 133*  K 4.2  CL 101  CO2 23  GLUCOSE 99  BUN 13  CREATININE 1.02  CALCIUM 8.2*     Intake/Output Summary (Last 24 hours) at 03/11/2024 0932 Last data filed at 03/11/2024 0725 Gross per 24 hour  Intake 960 ml  Output --  Net 960 ml        Physical Exam: Vital Signs Blood pressure 122/77, pulse 74, temperature (!) 97.5 F (36.4 C), resp. rate 18, height (P) 6' (1.829 m), SpO2 94%. Constitutional: No apparent distress.   HENT: No JVD. Neck Supple. Trachea midline. Atraumatic, normocephalic. Eyes: PERRLA. EOMI. Visual fields grossly intact.  Cardiovascular: No Edema. Well perfused Respiratory: Comfortable on RA Abdomen: No distention or tenderness.  Skin: C/D/I. No apparent lesions.    MSK:      No apparent deformity.   Neurologic exam:  Cognition: AAO to person, place, time and event.  Moving all 4 limbs antigravity against assistance. Some intermittent, rare emotional lability, but overall appropriate.  Good insight into current condition.  Sensation equal throughout.  5 out of 5 bilateral upper and lower extremities, with very mild right upper and lower extremity weakness.    Unchanged 4-15  Assessment/Plan: 1. Functional deficits which require 3+ hours per day of interdisciplinary therapy in a comprehensive inpatient rehab setting. Physiatrist is providing close team supervision and 24 hour management of active medical problems listed below. Physiatrist and rehab team  continue to assess barriers to discharge/monitor patient progress toward functional and medical goals  Care Tool:  Bathing    Body parts bathed by patient: Right arm, Left arm, Chest, Abdomen, Front perineal area, Buttocks, Right upper leg, Left upper leg, Right lower leg, Left lower leg, Face         Bathing assist Assist Level: Supervision/Verbal cueing     Upper Body Dressing/Undressing Upper body dressing   What is the patient wearing?: Pull over shirt    Upper body assist Assist Level: Supervision/Verbal cueing    Lower Body Dressing/Undressing Lower body dressing      What is the patient wearing?: Underwear/pull up, Pants     Lower body assist Assist for lower body dressing: Supervision/Verbal cueing     Toileting Toileting    Toileting assist Assist for toileting: Contact Guard/Touching assist     Transfers Chair/bed transfer  Transfers assist     Chair/bed transfer assist level: Contact Guard/Touching assist     Locomotion Ambulation   Ambulation assist      Assist level: Minimal Assistance - Patient > 75% Assistive device: No Device Max distance: 160   Walk 10 feet activity   Assist     Assist level: Minimal  Assistance - Patient > 75% Assistive device: No Device   Walk 50 feet activity   Assist    Assist level: Minimal Assistance - Patient > 75% Assistive device: No Device    Walk 150 feet activity   Assist Walk 150 feet activity did not occur: Safety/medical concerns (fatigue)         Walk 10 feet on uneven surface  activity   Assist     Assist level: Minimal Assistance - Patient > 75% Assistive device: Other (comment) (Rt handrail)   Wheelchair     Assist Is the patient using a wheelchair?: Yes Type of Wheelchair: Manual    Wheelchair assist level: Dependent - Patient 0% Max wheelchair distance: 150'    Wheelchair 50 feet with 2 turns activity    Assist        Assist Level: Dependent -  Patient 0%   Wheelchair 150 feet activity     Assist      Assist Level: Dependent - Patient 0%   Blood pressure 122/77, pulse 74, temperature (!) 97.5 F (36.4 C), resp. rate 18, height (P) 6' (1.829 m), SpO2 94%.  Medical Problem List and Plan: 1. 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 Walking CGA-Min A without AD, good endurance but needs DME for support, hurricane vs. Walker for SPV. Barriers with balance and awareness. Regular with thins for SLP, some breathing-swallow pattern difficulty. Mod I for cognition.    2.  Antithrombotics: -DVT/anticoagulation:  Mechanical: Sequential compression devices, below knee Bilateral lower extremities             -antiplatelet therapy: N/A secondary to SDH--no injuries/fall per reports.  3. Pain Management: Tylenol prn with hydrocodone prn severe pain--->last used 04/02.    - 4/8: Reports of headache overnight, behind eyes, mostly when sitting upright.  Suspect orthostatic related, will treat as below.  Consider addition of Topamax if no improvement.  4-9: HA improved; monitor- -4-10 headache continues to be 1 out of 10, does not want additional treatment  4. Mood/Behavior/Sleep: LCSW to follow for evaluation and support.              -has hx of DDD/Panic attacks, on wellbutrin XL 300mg  daily--resume             -antipsychotic agents: N/A             --insomnia: Reports sleeping well.  Melatonin prn.              -check sleep chart at least for tonight             -observe for mood lability  -4-8: Poor sleep overnight secondary to spilling urinal; sleep log appropriate.  Monitor.  Sleep improved  4-15: Patient complaining of increased emotional  lability, depression status post SDH.  Discussed this is a normal part of the process, offered antidepressants as an outpatient.  Will remove Keppra today and monitor results.  5. Neuropsych/cognition: This patient is capable of making decisions on his  own behalf.             -has hx of ADD--continue home straterra  - 4-15: Dr. Cheryll Corti evaluated yesterday; appreciate recommendations and insights  6. Skin/Wound Care: Routine pressure relief measures.   -11: DC peripheral IV 7. Fluids/Electrolytes/Nutrition: Monitor I/O. No post op labs noted--will check in am.    - 4/8: Mild hyponatremia and elevated ALT; repeat in AM to trend.  Encourage p.o. fluids today.  Likely due to overdiuresis, but if no improvement with fluid intakes and holding diuretic, will get testing for SIADH consider normal saline bolus.  4-9: Sodium slightly improved, liver function tests normalizing.   4-11: Stable NA 133, liver function test normalized.  Can resume weekly monitoring.  Repeat labs 4-15-stable  8. Hx Hydrocephalus s/p VPS/Seizure prophylaxis: continue Keppra BID  -Cognitively stable, no further seizures  4/15: No documented seizures during admission, was only on Keppra for prophylaxis, not prior to admission and was removed on discharge med list; DC today due to emotional lability and no seizures  9. HTN: Monitor BP TID--continue hydrochlorothiazide/irbesartan              -DBP borderline today   - 4/8: BP stable overnight, orthostatic on exam.  DC hydrochlorothiazide, encourage p.o. and fluid intakes.  TED hose when out of bed.  - 4-9: Remains hypotensive today.  Will give 500 cc bolus of normal saline.  Encourage p.o. fluids.  - 4/10: Blood pressure much improved.  Encourage patient to drink at least 6-8 to 50 cc cups of water today.  He is agreeable.  4-11: Blood pressure soft but improved, no further symptomatic orthostasis, BUN/creatinine normalized.  Repeat labs 4-15.  Blood pressure stable      03/11/2024    4:48 AM 03/10/2024    7:42 PM 03/10/2024    1:24 PM  Vitals with BMI  Systolic 122 109 161  Diastolic 77 85 72  Pulse 74 92     10. GERD: continue Protonix  11. BPH?: continue flomax -monitor voiding patterns  - No PVRs; continent all voids, frequency reasonable  12. Leukocytosis. WBC 17.5. No fevers, no obvious suorce of infection. Likely post-op; repeat in AM.     -4-9: Improving to 12; no signs of infection.  continue monitoring.  13. Constipation.  Resolved.  -Colace 100 mg twice daily added  LBM 4/11  LOS: 8 days A FACE TO FACE EVALUATION WAS PERFORMED  Micheal Ayers 03/11/2024, 9:32 AM

## 2024-03-11 NOTE — Discharge Instructions (Signed)
 Inpatient Rehab Discharge Instructions  Micheal Ayers Discharge date and time: 03/13/24   Activities/Precautions/ Functional Status: Activity: no lifting, driving, or strenuous exercise till cleared by Neurosurgery Diet: regular diet Wound Care: keep wound clean and dry   Functional status:  ___ No restrictions     ___ Walk up steps independently ___ 24/7 supervision/assistance   ___ Walk up steps with assistance ___ Intermittent supervision/assistance  ___ Bathe/dress independently ___ Walk with walker     ___ Bathe/dress with assistance ___ Walk Independently    ___ Shower independently ___ Walk with assistance    ___ Shower with assistance ___ No alcohol     ___ Return to work/school ________   Special Instructions:    My questions have been answered and I understand these instructions. I will adhere to these goals and the provided educational materials after my discharge from the hospital.  Patient/Caregiver Signature _______________________________ Date __________  Clinician Signature _______________________________________ Date __________  Please bring this form and your medication list with you to all your follow-up doctor's appointments.

## 2024-03-11 NOTE — Consult Note (Signed)
 Neuropsychological Consultation Comprehensive Inpatient Rehab   Patient:   Micheal Ayers   DOB:   [birth year]  MR Number:  [id number]  Location:  [facility] [address] [place] [address] Dept: [phone] Loc: [phone]           Date of Service:   03/10/2024  Start Time:   8 AM End Time:   9 AM  Provider/Observer:  Arley Phenix, Psy.D.       Clinical Neuropsychologist       Billing Code/Service: [phone]  Reason for Service:    Micheal Ayers is a 63 year old male referred for neuropsychological consultation following admission to the comprehensive inpatient rehabilitation unit due to left subdural hematoma and left frontotemporal craniotomy.  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  Patient was admitted on 02/24/2024 with a 2-day history of right facial droop and difficulty walking.  Patient had significant left subdural hematoma with midline shift.  Neurosurgery evaluated and patient underwent left frontotemporal parietal craniotomy for evacuation of large chronic subdural hematoma that day.  Patient admitted to CIR due to functional decline with decrease in coordination on right side and shuffling gait and balance deficits.  During today's clinical visit the patient was oriented and noted that he has been improving over the past several days.  Patient was aware of his CVA as well as past difficulties with hydrocephalus and shunt.  Patient notes that he had been independent prior to admission.  Patient described history of depression and anxiety with panic attack and I suspect his attentional deficits were primarily around those issues.  Fatigue and lethargy were also noted with difficulty differentiating that from his other cerebrovascular issues.   Patient denied panic attacks during current admission but notes that he is stressing about his capacity upon discharge and future capacity.  Today we worked on coping and adjustment.  Patient was able to recall recent information adequately at a level that would allow for benefit from ongoing therapeutic interventions to aid with motor functioning and balance issues.  Patient was able to maintain attention during our discussions at a mildly impaired level.  HPI for the current admission:    HPI: Micheal Ayers is a 63 year old male with history of CVA 01/2023, 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 He was found to have massive left SDH with sings of septations and small subacute right parafalcine SDH with midline shift. He was evaluated by Dr. Yetta Barre and underwent left fronto-temporoparietal crani for evacuation of large chronic LDH the same day. He was placed on decadron and on Keppra for seizure prophylaxis and serial CT head showed increaseing post op pneumocephalus with residual L-SDH decreased to 6-8 mm from 12 mm and resolution of medline shift. Decadron d/c this am and HA reported to be resolving. Therapy has been working with patient who is noted to have balance deficits with difficulty WB thru RLE, decrease in coordination on the right and noted to have shuffling gait tendency to drift left to right. He was independent and working PTA. CIR recommended due to functional decline.   Medical History:   Past Medical History:  Diagnosis Date   ADD (attention deficit disorder)  Allergic rhinitis, cause unspecified    Chronic kidney disease    Acute renal failure   Hemochromatosis, hereditary (HCC)    seen at [facility]   Hypertension    Major depressive disorder    Stroke [facility])          Patient Active Problem List   Diagnosis Date Noted    Subdural hematoma (HCC) 02/24/2024   S/P craniotomy 02/24/2024   Obstructive hydrocephalus (HCC) 03/13/2023   Ischemic cerebrovascular accident (CVA) (HCC) 02/19/2023   Cerebral ventriculomegaly 02/19/2023   Acute renal failure superimposed on stage 2 chronic kidney disease (HCC) 02/19/2023   Hypertensive urgency 02/19/2023   Abnormal CXR 02/19/2023   CVA (cerebral vascular accident) (HCC) 02/19/2023   Fatigue 09/20/2012   Family history of hemochromatosis 09/20/2012   Preventative health care 09/06/2012   ADD (attention deficit disorder)    Allergic rhinitis     Behavioral Observation/Mental Status:   Micheal Ayers  presents as a 63 y.o.-year-old Right handed  Male who appeared his stated age. his dress was Appropriate and he was Well Groomed and his manners were Appropriate to the situation.  his participation was indicative of Appropriate behaviors.  There were physical disabilities noted.  he displayed an appropriate level of cooperation and motivation.    Interactions:    Active Appropriate  Attention:   abnormal and attention span appeared shorter than expected for age  Memory:   abnormal; remote memory intact, recent memory impaired  Visuo-spatial:   not examined  Speech (Volume):  normal  Speech:   normal; normal  Thought Process:  Coherent and Relevant  Coherent  Though Content:  WNL; not suicidal and not homicidal  Orientation:   person, place, time/date, and situation  Judgment:   Fair  Planning:   Fair  Affect:    Appropriate  Mood:    Dysphoric  Insight:   Good  Intelligence:   normal  Family Med/Psych History:  Family History  Problem Relation Age of Onset   Hypertension Other    Alcohol abuse Other    Hypertension Other    Kidney disease Other    Cancer Other    Alcohol abuse Other    Arthritis Other    Stroke Other    Hypertension Other    Cancer Other    Impression/DX:   Micheal Ayers is a 63 year old male referred for  neuropsychological consultation following admission to the comprehensive inpatient rehabilitation unit due to left subdural hematoma and left frontotemporal craniotomy.  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  Patient was admitted on 02/24/2024 with a 2-day history of right facial droop and difficulty walking.  Patient had significant left subdural hematoma with midline shift.  Neurosurgery evaluated and patient underwent left frontotemporal parietal craniotomy for evacuation of large chronic subdural hematoma that day.  Patient admitted to CIR due to functional decline with decrease in coordination on right side and shuffling gait and balance deficits.  During today's clinical visit the patient was oriented and noted that he has been improving over the past several days.  Patient was aware of his CVA as well as past difficulties with hydrocephalus and shunt.  Patient notes that he had been independent prior to admission.  Patient described history of depression and anxiety with panic attack and I suspect his attentional deficits were primarily around those issues.  Fatigue and lethargy  were also noted with difficulty differentiating that from his other cerebrovascular issues.  Patient denied panic attacks during current admission but notes that he is stressing about his capacity upon discharge and future capacity.  Today we worked on coping and adjustment.  Patient was able to recall recent information adequately at a level that would allow for benefit from ongoing therapeutic interventions to aid with motor functioning and balance issues.  Patient was able to maintain attention during our discussions at a mildly impaired level.         Electronically Signed   _______________________ Chapman Commodore, Psy.D. Clinical Neuropsychologist

## 2024-03-12 ENCOUNTER — Other Ambulatory Visit (HOSPITAL_COMMUNITY): Payer: Self-pay

## 2024-03-12 MED ORDER — ROSUVASTATIN CALCIUM 10 MG PO TABS
10.0000 mg | ORAL_TABLET | Freq: Every day | ORAL | 0 refills | Status: DC
Start: 2024-03-12 — End: 2024-08-13
  Filled 2024-03-12: qty 30, 30d supply, fill #0

## 2024-03-12 MED ORDER — SENNOSIDES-DOCUSATE SODIUM 8.6-50 MG PO TABS
2.0000 | ORAL_TABLET | Freq: Every day | ORAL | 0 refills | Status: AC
  Filled 2024-03-12: qty 60, 30d supply, fill #0

## 2024-03-12 MED ORDER — POLYETHYLENE GLYCOL 3350 17 GM/SCOOP PO POWD
17.0000 g | Freq: Every day | ORAL | 0 refills | Status: DC
  Filled 2024-03-12: qty 476, 28d supply, fill #0

## 2024-03-12 MED ORDER — IRBESARTAN 75 MG PO TABS
75.0000 mg | ORAL_TABLET | Freq: Every day | ORAL | 0 refills | Status: DC
  Filled 2024-03-12: qty 30, 30d supply, fill #0

## 2024-03-12 MED ORDER — TAMSULOSIN HCL 0.4 MG PO CAPS
0.4000 mg | ORAL_CAPSULE | Freq: Every day | ORAL | 0 refills | Status: DC
  Filled 2024-03-12: qty 30, 30d supply, fill #0

## 2024-03-12 MED ORDER — POLYETHYLENE GLYCOL 3350 17 GM/SCOOP PO POWD
17.0000 g | Freq: Every day | ORAL | 0 refills | Status: DC | PRN
  Filled 2024-03-12: qty 238, 14d supply, fill #0

## 2024-03-12 NOTE — Progress Notes (Signed)
 Physical Therapy Session Note  Patient Details  Name: Micheal Ayers MRN: [id number] Date of Birth: 06/29/1961  Today's Date: 03/12/2024 PT Individual Time: 8295-6213 PT Individual Time Calculation (min): 40 min   Short Term Goals: Week 1:  PT Short Term Goal 1 (Week 1): STGs = LTGs  Skilled Therapeutic Interventions/Progress Updates:   Received pt sitting in recliner with wife and daughter present for family education training. Pt agreeable to PT treatment and did not report pain during session. Session with emphasis on discharge planning, functional mobility/transfers, generalized strengthening and endurance, dynamic standing balance/coordination, simulated car transfers, stair navigation, and gait training.   Pt performed all transfers with RW and supervision/mod I throughout session. Pt ambulated 156ft with RW and supervision to ortho gym and performed ambulatory simulated car transfer with RW and supervision and ambulated 43ft on uneven surfaces (ramp) with RW and supervision. Took seated rest break, then ambulated 110ft with RW and supervision to main therapy gym. Pt navigated 12 6in steps with R handrail and supervision with cues to keep BUE support on railing and for step to pattern. Pt then ambulated 174ft with RW and supervision back to room and able to stand and pick up object from floor using reacher and supervision. Pt then ambulated ~214ft without AD and CGA fading to min A just to demonstrate gait impairments to family., and reinforced importance of using RW upon discharge. Pt required cues to decrease cadence, widen BOS, and to land on heels. Pt also demonstrates anterior lean/LOB, requiring cues to "stop or slow to reset" prior to continuing. Concluded session with pt sitting in recliner with family at bedside awaiting upcoming OT session.   Educated and discussed with pt and pt's wife the following: - using RW with mobility to optimize safety and independence - follow up therapy  (outpatient) - need for supervision with stair navigation and having family bring RW to top of stairs so that it is ready. Also educated family on proper cues to provide for safety - using reacher   Therapy Documentation Precautions:  Precautions Precautions: Fall Precaution/Restrictions Comments: L crani, Monitor vitals Restrictions Weight Bearing Restrictions Per Provider Order: No  Therapy/Group: Individual Therapy Nicolas Barren Zaunegger Nena Bank PT, DPT 03/12/2024, 7:03 AM

## 2024-03-12 NOTE — Progress Notes (Signed)
 Occupational Therapy Session Note  Patient Details  Name: Micheal Ayers MRN: [id number] Date of Birth: 1960-12-04  Today's Date: 03/12/2024 OT Individual Time: 0865-7846 OT Individual Time Calculation (min): 60 min    Short Term Goals: Week 2:  OT Short Term Goal 1 (Week 2): STG= LTG d/t ELOS  Skilled Therapeutic Interventions/Progress Updates:    Family education session completed with pt and his wife. Verbal education provided re fall risk reduction, energy conservation strategies, home carryover of transfer training, ADLs, and IADLs. Demonstration and hands on training completed for pt performance of UB/LB bathing and dressing at (S) level, toileting hygiene and transfers, and shower transfers. Provided education and demonstration on DME use recommendations at home. Pt's wife had multiple questions throughout session and all were answered. She reports she is hearing impaired and ideas were given, products shown on [employer], for communication systems between pt and her, were he to need her during the night. Discussed importance of a daily routine and integration into personally meaningful IADLs/habits. He completed a full shower during session with (S) overall. Pt was left sitting up in the wheelchair with all needs met, chair alarm set, and call bell within reach.    Therapy Documentation Precautions:  Precautions Precautions: Fall Precaution/Restrictions Comments: L crani, Monitor vitals Restrictions Weight Bearing Restrictions Per Provider Order: No  Therapy/Group: Individual Therapy  Una Ganser 03/12/2024, 6:14 AM

## 2024-03-12 NOTE — Plan of Care (Signed)
  Problem: RH Swallowing Goal: LTG Patient will consume least restrictive diet using compensatory strategies with assistance (SLP) Description: LTG:  Patient will consume least restrictive diet using compensatory strategies with assistance (SLP) Outcome: Completed/Met   Problem: RH Expression Communication Goal: LTG Patient will verbally express basic/complex needs(SLP) Description: LTG:  Patient will verbally express basic/complex needs, wants or ideas with cues  (SLP) Outcome: Completed/Met Goal: LTG Patient will increase word finding of common (SLP) Description: LTG:  Patient will increase word finding of common objects/daily info/abstract thoughts with cues using compensatory strategies (SLP). Outcome: Completed/Met   Problem: RH Attention Goal: LTG Patient will demonstrate this level of attention during functional activites (SLP) Description: LTG:  Patient will will demonstrate this level of attention during functional activites (SLP) Outcome: Completed/Met   Problem: RH Pre-functional/Other (Specify) Goal: RH LTG SLP (Specify) 1 Description: RH LTG SLP (Specify) 1 Outcome: Completed/Met

## 2024-03-12 NOTE — Progress Notes (Signed)
 Speech Language Pathology Discharge Summary  Patient Details  Name: Micheal Ayers MRN: [id number] Date of Birth: Mar 03, 1961  Date of Discharge from SLP service:March 12, 2024  Today's Date: 03/12/2024 SLP Individual Time: 1330-1430 SLP Individual Time Calculation (min): 60 min   Skilled Therapeutic Interventions:  Pt and his wife greeted at bedside. He was very pleasant and cooperative throughout tx tasks targeting cognition and dysphagia. SLP provided pt w/ written aids/instructions to continue EMST protocol in the home environment. Resistance remained at 35 cmH2O and he was able to complete his 25 reps independently. Additionally, he demonstrated the ability to increase and decrease the resistance as instructed. SLP then facilitated 3 step memory task targeting recall, information processing, and attention to detail. He initially completed the task independently, though required cue during final 2/12 trials for attention to detail. He was able to ID errors as they occurred and formulated possible solutions for the work environment. He and his wife verbalized understanding of final education re dr's approval to return to work/driving, completion of cognitive tx/mastery of tx goals, and d/c from ST services. At the end of the tx tasks, he was left in his recliner with the call light within reach.     Patient has met 5 of 5 long term goals.  Patient to discharge at overall Modified Independent level.  Reasons goals not met: n/a - all goals met   Clinical Impression/Discharge Summary:  Pt has made excellent progress this stay as evidenced by mastery of 5/5 goals and improved executive functioning/specific word finding. He also demonstrates improving airway protection and breath/swallow coordination. He independently completes his EMST protocol and demonstrates understanding to complete protocol in entirety at home. Continued ST services are not warranted as pt completes complex cognitive tasks @  modI overall. Additionally, the pt and his wife verbalized understanding of all education provided.    Care Partner:  Caregiver Able to Provide Assistance: Yes  Type of Caregiver Assistance: Physical  Recommendation:  None      Equipment: n/a   Reasons for discharge: Discharged from hospital   Patient/Family Agrees with Progress Made and Goals Achieved: Yes    Micheal Ayers 03/12/2024, 7:58 AM

## 2024-03-12 NOTE — Plan of Care (Signed)
  Problem: RH BOWEL ELIMINATION Goal: RH STG MANAGE BOWEL WITH ASSISTANCE Description: STG Manage Bowel with Mod I Assistance. Outcome: Progressing   Problem: RH BLADDER ELIMINATION Goal: RH STG MANAGE BLADDER WITH ASSISTANCE Description: STG Manage Bladder With  Mod I  Assistance Outcome: Progressing   Problem: RH SKIN INTEGRITY Goal: RH STG SKIN FREE OF INFECTION/BREAKDOWN Description: Manage skin free of infection/breakdown with mod I assistance Outcome: Progressing   Problem: RH SAFETY Goal: RH STG ADHERE TO SAFETY PRECAUTIONS W/ASSISTANCE/DEVICE Description: STG Adhere to Safety Precautions With  mod I Assistance/Device. Outcome: Progressing

## 2024-03-12 NOTE — Progress Notes (Signed)
 Occupational Therapy Discharge Summary  Patient Details  Name: Micheal Ayers MRN: [id number] Date of Birth: 07-23-1961  Date of Discharge from OT service:March 12, 2024  Today's Date: 03/12/2024 OT Individual Time: 4132-4401 OT Individual Time Calculation (min): 45 min   Patient has met 9 of 9 long term goals due to improved activity tolerance, improved balance, postural control, ability to compensate for deficits, improved attention, improved awareness, and improved coordination.  Patient to discharge at overall Supervision level.  Patient's care partner is independent to provide the necessary cognitive assistance at discharge. Micheal Ayers has made good progress in CIR, progressing to a (S) level with ADLs and transfers. He remains limited by cognitive deficits that impact safety awareness, emotional lability, and judgement at times. Family education completed with his wife.   Recommendation:  Patient will benefit from ongoing skilled OT services in outpatient setting to continue to advance functional skills in the area of BADL and iADL.  Equipment: RW, shower chair recommended  Reasons for discharge: treatment goals met and discharge from hospital  Patient/family agrees with progress made and goals achieved: Yes  Skilled OT Intervention Pt received siting in the recliner with no c/o pain, agreeable to OT session. He began with toileting tasks, voiding urine in standing with (S) overall. He completed 100 ft of functional mobility to the therapy gym with close (S). He still requires cueing for RW management. Pt completed floor transfer, completing the following sequence after a demonstration lowering themselves to the a floor mat, getting into full supine, transitioning into quadruped, and then kneeling, before pivot into sitting EOM- simulating floor to couch or chair at home. OT provided education on fall recovery, when to get up independently vs when to call for EMS after a fall. Pt returned the  demonstration with (S). Provided demo of OTAGO A exercises for balance as well as the following UE HEP listed below with a level 2 resistance band. Pt returned demo of both. He returned to his room following and was left supine with all needs met, chair alarm set.    Access Code: [email or web address] URL: [email or web address] Date: 03/12/2024 Prepared by: Eilene Grater  Exercises - Supine Bridge  - 1 x daily - 7 x weekly - 3 sets - 10 reps - Seated Shoulder Horizontal Abduction with Resistance  - 1 x daily - 7 x weekly - 3 sets - 10 reps - Seated Shoulder Flexion with Self-Anchored Resistance  - 1 x daily - 7 x weekly - 3 sets - 10 reps - Seated Shoulder Diagonal Pulls with Resistance  - 1 x daily - 7 x weekly - 3 sets - 10 reps - Seated Elbow Flexion with Self-Anchored Resistance  - 1 x daily - 7 x weekly - 3 sets - 10 reps - Seated Elbow Extension with Self-Anchored Resistance  - 1 x daily - 7 x weekly - 3 sets - 10 reps  OT Discharge Precautions/Restrictions  Precautions Precautions: Fall Restrictions Weight Bearing Restrictions Per Provider Order: No  ADL ADL Eating: Independent Where Assessed-Eating: Chair Grooming: Modified independent Where Assessed-Grooming: Sitting at sink Upper Body Bathing: Supervision/safety Where Assessed-Upper Body Bathing: Shower Lower Body Bathing: Supervision/safety Where Assessed-Lower Body Bathing: Shower Upper Body Dressing: Supervision/safety Where Assessed-Upper Body Dressing: Edge of bed Lower Body Dressing: Supervision/safety Where Assessed-Lower Body Dressing: Edge of bed Toileting: Supervision/safety Where Assessed-Toileting: Teacher, adult education: Distant supervision Statistician Method: Proofreader: Bedside commode, Grab bars Tub/Shower Transfer: Close supervison Web designer Method: Engineer, technical sales:  Insurance underwriter: Close supervision Lexicographer Method: Designer, industrial/product: Information systems manager without back Vision Baseline Vision/History: 1 Wears glasses Patient Visual Report: No change from baseline Vision Assessment?: No apparent visual deficits Perception  Perception: Within Functional Limits Praxis Praxis: WFL Cognition Cognition Overall Cognitive Status: Impaired/Different from baseline Arousal/Alertness: Awake/alert Memory: Impaired Memory Impairment: Decreased short term memory Decreased Short Term Memory: Verbal complex;Functional complex Attention: Selective Selective Attention: Appears intact Awareness: Appears intact Problem Solving: Appears intact Executive Function: Reasoning;Self Monitoring;Self Correcting Reasoning: Appears intact Self Monitoring: Appears intact Self Correcting: Appears intact Behaviors: Lability Safety/Judgment: Appears intact Comments: Some impairements in awareness/judgement Brief Interview for Mental Status (BIMS) Repetition of Three Words (First Attempt): 3 Temporal Orientation: Year: Correct Temporal Orientation: Month: Accurate within 5 days Temporal Orientation: Day: Correct Recall: "Sock": No, could not recall Recall: "Blue": Yes, no cue required Recall: "Bed": No, could not recall BIMS Summary Score: 11 Sensation Sensation Light Touch: Appears Intact Hot/Cold: Appears Intact Proprioception: Appears Intact Stereognosis: Not tested Coordination Gross Motor Movements are Fluid and Coordinated: No Fine Motor Movements are Fluid and Coordinated: Yes Coordination and Movement Description: Deficits due to generalized weakness and decreased balance/coordination Motor  Motor Motor: Abnormal postural alignment and control Motor - Skilled Clinical Observations: Deficits due to generalized weakness and decreased balance/coordination Mobility  Bed Mobility Bed Mobility: Rolling Right;Rolling Left;Sit to Supine;Supine to Sit Rolling Right:  Independent Rolling Left: Independent Supine to Sit: Independent Sit to Supine: Independent Transfers Sit to Stand: Independent with assistive device Stand to Sit: Independent with assistive device  Trunk/Postural Assessment  Cervical Assessment Cervical Assessment: Within Functional Limits Thoracic Assessment Thoracic Assessment: Within Functional Limits Lumbar Assessment Lumbar Assessment: Within Functional Limits Postural Control Postural Control: Deficits on evaluation Righting Reactions: Delayed - improved since eval Protective Responses: Delayed - improved since eval  Balance Balance Balance Assessed: Yes Static Sitting Balance Static Sitting - Balance Support: Feet supported;No upper extremity supported Static Sitting - Level of Assistance: 7: Independent Dynamic Sitting Balance Dynamic Sitting - Balance Support: Feet supported;No upper extremity supported Dynamic Sitting - Level of Assistance: 6: Modified independent (Device/Increase time) Static Standing Balance Static Standing - Balance Support: Bilateral upper extremity supported;During functional activity Static Standing - Level of Assistance: 6: Modified independent (Device/Increase time) Dynamic Standing Balance Dynamic Standing - Balance Support: Bilateral upper extremity supported;During functional activity Dynamic Standing - Level of Assistance: 5: Stand by assistance Dynamic Standing - Comments: transfers Extremity/Trunk Assessment RUE Assessment RUE Assessment: Within Functional Limits LUE Assessment LUE Assessment: Within Functional Limits   Una Ganser 03/12/2024, 3:25 PM
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
## 2024-03-12 NOTE — Progress Notes (Signed)
 Patient ID: Micheal Ayers, male   DOB: [birth year], 62 y.o.   MRN: [id number]  Per PT, pt would like to [facility] location.   SW met with pt during OT session to confirm above.  Norval Been, MSW, LCSW Office: [phone] Cell: [phone] Fax: [phone]

## 2024-03-12 NOTE — Progress Notes (Signed)
 Physical Therapy Discharge Summary  Patient Details  Name: Micheal Ayers MRN: [id number] Date of Birth: 04/17/1961  Date of Discharge from PT service:March 12, 2024  Patient has met 9 of 9 long term goals due to improved activity tolerance, improved balance, improved postural control, increased strength, ability to compensate for deficits, improved awareness, and improved coordination.  Patient to discharge at an ambulatory level Supervision using RW. Patient's care partner is independent to provide the necessary physical assistance at discharge.  All goals met   Recommendation:  Patient will benefit from ongoing skilled PT services in outpatient setting to continue to advance safe functional mobility, address ongoing impairments in transfers, generalized strengthening and endurance, dynamic standing balance/coordination, gait training, and to minimize fall risk.  Equipment: No equipment provided - pt has RW at home  Reasons for discharge: treatment goals met and discharge from hospital  Patient/family agrees with progress made and goals achieved: Yes  PT Discharge Precautions/Restrictions Precautions Precautions: Fall Precaution/Restrictions Comments: L crani, Monitor vitals Restrictions Weight Bearing Restrictions Per Provider Order: No Pain Interference Pain Interference Pain Effect on Sleep: 1. Rarely or not at all Pain Interference with Therapy Activities: 1. Rarely or not at all Pain Interference with Day-to-Day Activities: 1. Rarely or not at all Cognition Overall Cognitive Status: Within Functional Limits for tasks assessed Arousal/Alertness: Awake/alert Orientation Level: Oriented X4 Year: 2025 Month: April Day of Week: Correct Attention: Selective Selective Attention: Appears intact Memory: Appears intact Awareness: Appears intact Problem Solving: Appears intact Executive Function: Reasoning;Self Monitoring;Self Correcting Reasoning: Appears intact Self  Monitoring: Appears intact Self Correcting: Appears intact Behaviors: Lability Safety/Judgment: Appears intact Sensation Sensation Light Touch: Appears Intact Hot/Cold: Not tested Proprioception: Appears Intact Stereognosis: Not tested Coordination Gross Motor Movements are Fluid and Coordinated: No Coordination and Movement Description: Deficits due to generalized weakness and decreased balance/coordination Motor  Motor Motor: Abnormal postural alignment and control Motor - Skilled Clinical Observations: Deficits due to generalized weakness and decreased balance/coordination  Mobility Bed Mobility Bed Mobility: Rolling Right;Rolling Left;Sit to Supine;Supine to Sit Rolling Right: Supervision/verbal cueing Rolling Left: Supervision/Verbal cueing Supine to Sit: Supervision/Verbal cueing Sit to Supine: Supervision/Verbal cueing Transfers Transfers: Sit to Stand;Stand to Sit;Stand Pivot Transfers Sit to Stand: Independent with assistive device Stand to Sit: Independent with assistive device Stand Pivot Transfers: Independent with assistive device Transfer (Assistive device): Rolling walker Locomotion  Gait Ambulation: Yes Gait Assistance: Supervision/Verbal cueing Gait Distance (Feet): 150 Feet Assistive device: Rolling walker Gait Assistance Details: Verbal cues for technique Gait Assistance Details: verbal cues to widen BOS and for heel strike Gait Gait: Yes Gait Pattern: Impaired Gait Pattern: Decreased stride length;Decreased step length - right;Decreased step length - left;Narrow base of support;Poor foot clearance - right;Poor foot clearance - left Gait velocity: decreased Stairs / Additional Locomotion Stairs: Yes Stairs Assistance: Supervision/Verbal cueing Stair Management Technique: One rail Right Number of Stairs: 12 Height of Stairs: 6 Ramp: Supervision/Verbal cueing (RW) Curb: Minimal Assistance - Patient >75% Pick up small object from the floor assist  level: Supervision/Verbal cueing Pick up small object from the floor assistive device: reacher and RW Wheelchair Mobility Wheelchair Mobility: No  Trunk/Postural Assessment  Cervical Assessment Cervical Assessment: Exceptions to [facility] (forward head) Thoracic Assessment Thoracic Assessment: Exceptions to [facility] (rounded shoulders) Lumbar Assessment Lumbar Assessment: Exceptions to [facility] (posterior pelvic tilt) Postural Control Postural Control: Deficits on evaluation Righting Reactions: Delayed - improved since eval Protective Responses: Delayed - improved since eval  Balance Balance Balance Assessed: Yes Static Sitting Balance Static Sitting -  Balance Support: Feet supported;No upper extremity supported Static Sitting - Level of Assistance: 7: Independent Dynamic Sitting Balance Dynamic Sitting - Balance Support: Feet supported;No upper extremity supported Dynamic Sitting - Level of Assistance: 6: Modified independent (Device/Increase time) Static Standing Balance Static Standing - Balance Support: Bilateral upper extremity supported;During functional activity (RW) Static Standing - Level of Assistance: 6: Modified independent (Device/Increase time) Dynamic Standing Balance Dynamic Standing - Balance Support: Bilateral upper extremity supported;During functional activity (RW) Dynamic Standing - Level of Assistance: 6: Modified independent (Device/Increase time) Dynamic Standing - Comments: transfers Extremity Assessment  RLE Assessment RLE Assessment: Within Functional Limits LLE Assessment LLE Assessment: Within Functional Limits   Nicolas Barren Zaunegger Nena Bank PT, DPT 03/12/2024, 7:07 AM

## 2024-03-12 NOTE — Progress Notes (Signed)
 Inpatient Rehabilitation Discharge Medication Review by a Pharmacist  A complete drug regimen review was completed for this patient to identify any potential clinically significant medication issues.  High Risk Drug Classes Is patient taking? Indication by Medication  Antipsychotic No   Anticoagulant No   Antibiotic No   Opioid No   Antiplatelet No   Hypoglycemics/insulin No   Vasoactive Medication Yes Avapro- HTN Flomax- BPH  Chemotherapy No   Other Yes APAP- pain  Strattera- ADHD Wellbutrin- MDD Crestor- HLD  Omeprazole- acid reflux  VitD, cyanocobalamin- supplement  Senokot-S, PEG- constipation       Type of Medication Issue Identified Description of Issue Recommendation(s)  Drug Interaction(s) (clinically significant)     Duplicate Therapy     Allergy     No Medication Administration End Date     Incorrect Dose     Additional Drug Therapy Needed     Significant med changes from prior encounter (inform family/care partners about these prior to discharge).    Other  PTA meds discontinued: aspirin, Diovan- HCT, amlodipine   New: Avapro    Restart PTA meds when and if necessary during CIR admission or at time of discharge, if warranted     Clinically significant medication issues were identified that warrant physician communication and completion of prescribed/recommended actions by midnight of the next day:  No    Time spent performing this drug regimen review (minutes):  30  Chrystie Crass, PharmD Clinical Pharmacist  03/12/2024 11:41 AM
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
## 2024-03-13 NOTE — Progress Notes (Signed)
 Patient ID: Micheal Ayers, male   DOB: 29-May-1961, 63 y.o.   MRN: [id number]  SW faxed outpatient PT/OT/SLP referral to [facility] location.   Norval Been, MSW, LCSW Office: [phone] Cell: [phone] Fax: [phone]

## 2024-03-13 NOTE — Plan of Care (Signed)
  Problem: Consults Goal: RH BRAIN INJURY PATIENT EDUCATION Description: Description: See Patient Education module for eduction specifics Outcome: Progressing   Problem: RH BOWEL ELIMINATION Goal: RH STG MANAGE BOWEL WITH ASSISTANCE Description: STG Manage Bowel with Mod I Assistance. Outcome: Progressing   Problem: RH BLADDER ELIMINATION Goal: RH STG MANAGE BLADDER WITH ASSISTANCE Description: STG Manage Bladder With  Mod I  Assistance Outcome: Progressing   Problem: RH SKIN INTEGRITY Goal: RH STG SKIN FREE OF INFECTION/BREAKDOWN Description: Manage skin free of infection/breakdown with mod I assistance Outcome: Progressing   Problem: RH SAFETY Goal: RH STG ADHERE TO SAFETY PRECAUTIONS W/ASSISTANCE/DEVICE Description: STG Adhere to Safety Precautions With  mod I Assistance/Device. Outcome: Progressing

## 2024-03-13 NOTE — Progress Notes (Signed)
 Inpatient Rehabilitation Care Coordinator Discharge Note   Patient Details  Name: Micheal Ayers MRN: [id number] Date of Birth: 1961/10/21   Discharge location: D/c to home  Length of Stay: 9 days  Discharge activity level: Supervision  Home/community participation: Limited  Patient response WU:JWJXBJ Literacy - How often do you need to have someone help you when you read instructions, pamphlets, or other written material from your doctor or pharmacy?: Rarely  Patient response YN:WGNFAO Isolation - How often do you feel lonely or isolated from those around you?: Never  Services provided included: MD, RD, PT, OT, SLP, CM, RN, TR, Pharmacy, Neuropsych, SW  Financial Services:  Field seismologist Utilized: [employer]  Choices offered to/list presented to: patient and pt wife  Follow-up services arranged:  Outpatient    Outpatient Servicies: [facility] for PT/OT/SLP      Patient response to transportation need: Is the patient able to respond to transportation needs?: Yes In the past 12 months, has lack of transportation kept you from medical appointments or from getting medications?: No In the past 12 months, has lack of transportation kept you from meetings, work, or from getting things needed for daily living?: No   Patient/Family verbalized understanding of follow-up arrangements:  Yes  Individual responsible for coordination of the follow-up plan: contact pt wife Maudine Sos  Confirmed correct DME delivered: Rennis Case 03/13/2024    Comments (or additional information):fam edu completed  Summary of Stay    Date/Time Discharge Planning CSW  03/11/24 0953 Pt wife reports that she will be available as much as needed, and can provide physical assistance. SW will confirm there are no barriers. AAC  03/04/24 0916 TBA.Aaron Aas Pt wife reports that she will eb available as much as needed, and can provide physical assistance. SW will confirm there are no  barriers. AAC       Savana Spina A Brendolyn Callas

## 2024-03-17 DIAGNOSIS — K219 Gastro-esophageal reflux disease without esophagitis: Secondary | ICD-10-CM | POA: Insufficient documentation

## 2024-03-17 DIAGNOSIS — F419 Anxiety disorder, unspecified: Secondary | ICD-10-CM | POA: Insufficient documentation

## 2024-03-17 DIAGNOSIS — E871 Hypo-osmolality and hyponatremia: Secondary | ICD-10-CM | POA: Insufficient documentation

## 2024-03-17 NOTE — Discharge Summary (Signed)
 Physician Discharge Summary  Patient ID: Micheal Ayers MRN: [id number] DOB/AGE: 1961-01-26 63 y.o.  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   Hyponatremia   GERD (gastroesophageal reflux disease)   Anxiety disorder with panic attacks   Discharged Condition: stable  Significant Diagnostic Studies: N/A   Labs:  Basic Metabolic Panel:    Latest Ref Rng & Units 03/11/2024    5:23 AM 03/07/2024    6:18 AM 03/05/2024    5:11 AM  BMP  Glucose 70 - 99 mg/dL 99  782  956   BUN 8 - 23 mg/dL 13  19  31    Creatinine 0.61 - 1.24 mg/dL 2.13  0.86  5.78   Sodium 135 - 145 mmol/L 133  133  133   Potassium 3.5 - 5.1 mmol/L 4.2  4.3  3.8   Chloride 98 - 111 mmol/L 101  104  102   CO2 22 - 32 mmol/L 23  22  24    Calcium  8.9 - 10.3 mg/dL 8.2  8.4  8.1      CBC:    Latest Ref Rng & Units 03/11/2024    5:23 AM 03/05/2024    5:11 AM 03/04/2024    5:04 AM  CBC  WBC 4.0 - 10.5 K/uL 9.3  12.1  17.5   Hemoglobin 13.0 - 17.0 g/dL 46.9  62.9  52.8   Hematocrit 39.0 - 52.0 % 41.5  43.0  45.5   Platelets 150 - 400 K/uL 171  217  261      CBG: No results for input(s): "GLUCAP" in the last 168 hours.  Brief HPI:   Micheal Ayers is a 63 y.o. male with history of CVA 01/2023, obstructive hydrocephalus status post VP shunt 02/2023, chronic/GAD, panic attacks, low back to 02/24/2024 with 2-day history of right facial droop and difficulty DH with signs of septations and small subacute right parafalcine SDH with midline shift.  He underwent left for evacuation of bleed by Dr. Rochelle Chu.  Postop placed on Decadron  as well as Keppra  for seizure prophylaxis.  Follow-up CT showed resolution of midline shift Decadron  was discharged.  Therapy was working with patient who was noted to have balance  deficits with decreasing coordination on right, difficulty weightbearing through RLE as well as tendency to drift when walking.  He was independent working prior to admission.  CIR was recommended due to functional decline.   Hospital Course: Micheal Ayers was admitted to rehab 03/03/2024 for inpatient therapies to consist of PT, ST and OT at least three hours five days a week. Past admission physiatrist, therapy team and rehab RN have worked together to provide customized collaborative inpatient rehab. His blood pressures were monitored on TID basis and HCTZ was discontinued due to orthostatic changes.  He was treated with fluid bolus X 1 and encouraged to increase fluid intake.  Currently blood pressure is stable and family was educated on following up with PCP for input on resumption of medication.  Has been stable.  SCDs were used for DVT prophylaxis.  Headaches have improved with as needed use of Tylenol .  Sleep-wake disruption is improving. Mood is stable on home dose bupropion .   Follow-up check of BMET showed mild hyponatremia with elevated ALT.  Repeat check of electrolytes shows sodium to be stable at 133 and abnormal  LFTs have resolved.  He has been seizure-free during his stay with Keppra  on board per protocol and was discontinued use due to emotional lability.  Follow-up CBC shows leukocytosis has resolved and H/H is stable.  He has made good gains during his stay and is modified independent for mobility in supervised setting.  He will continue to receive follow-up patient PT at [facility] outpatient rehab in [facility] after discharge.   Rehab course: During patient's stay in rehab weekly team conferences were held to monitor patient's progress, set goals and discuss barriers to discharge. At admission, patient required min assist with mobility and with ADL tasks.  He exhibited slight throat clears with diet, reduced breath support while eating and mildly deficits in word finding, processing and  attention.  He  has had improvement in activity tolerance, balance, postural control as well as ability to compensate for deficits. He requires supervision with ADL tasks. He is independent for transfers and is able to ambulate 150 feet with use of rolling walker and verbal cues for technique. He demonstrated improved  airway protection and is able to complete EMST independently. He is able to complete cognitive tasks independently.  He is able to complete TMST at modified independent level.  Family education has been completed.   Discharge disposition: 01-Home or Self Care  Diet: Regular  Special Instructions: No driving, strenuous activity or return to work till cleared by MD. Recommend repeat check BMET in 1-2 weeks to monitor sodium levels  Discharge Instructions     Ambulatory referral to Occupational Therapy   Complete by: As directed    Eval and treat   Ambulatory referral to Physical Medicine Rehab   Complete by: As directed    Ambulatory referral to Physical Therapy   Complete by: As directed    Eval and treat      Allergies as of 03/13/2024       Reactions   Erythromycin Base Hives        Medication List     STOP taking these medications    amLODipine 5 MG tablet Commonly known as: NORVASC   Aspirin  Low Dose 81 MG tablet Generic drug: aspirin  EC   valsartan -hydrochlorothiazide  80-12.5 MG tablet Commonly known as: DIOVAN -HCT       TAKE these medications    acetaminophen  325 MG tablet Commonly known as: TYLENOL  Take 2 tablets (650 mg total) by mouth every 4 (four) hours as needed for mild pain (pain score 1-3). What changed: reasons to take this   atomoxetine  80 MG capsule Commonly known as: STRATTERA  Take 1 capsule (80 mg total) by mouth daily.   buPROPion  300 MG 24 hr tablet Commonly known as: Wellbutrin  XL Take 1 tablet (300 mg total) by mouth daily.   cyanocobalamin  1000 MCG tablet Commonly known as: VITAMIN B12 Take 1,000 mcg by mouth  daily.   irbesartan  75 MG tablet Commonly known as: AVAPRO  Take 1 tablet (75 mg total) by mouth daily.   polyethylene glycol powder 17 GM/SCOOP powder Commonly known as: GLYCOLAX /MIRALAX  Take 17 g by mouth daily as needed.   PriLOSEC OTC 20 MG tablet Generic drug: omeprazole Take 20 mg by mouth daily.   rosuvastatin  10 MG tablet Commonly known as: CRESTOR  Take 1 tablet (10 mg total) by mouth daily.   Senna-S 8.6-50 MG tablet Generic drug: senna-docusate Take 2 tablets by mouth daily with breakfast.   tamsulosin  0.4 MG Caps capsule Commonly known as: FLOMAX  Take 1 capsule (0.4 mg total) by mouth at bedtime.  Vitamin D3 50 MCG (2000 UT) capsule Take 2,000 Units by mouth daily.        Follow-up Information     Olin Bertin, MD Follow up.   Specialty: Family Medicine Why: Call in 1-2 days for post hospital follow up Contact information: [address] [place] [address] [phone]         Joaquin Mulberry, MD Follow up.   Specialty: Neurosurgery Why: Call in 1-2 days for post hospital follow up Contact information: [address] [place] [address] [phone]         Cherri Corns C, DO Follow up.   Specialty: Physical Medicine and Rehabilitation Why: office will call you with follow up appointment Contact information: [address] [place] [address] [phone]                 Signed: Zelda Hickman 03/17/2024, 6:12 PM

## 2024-03-20 ENCOUNTER — Other Ambulatory Visit: Payer: Self-pay

## 2024-03-20 ENCOUNTER — Ambulatory Visit: Attending: Physical Medicine and Rehabilitation | Admitting: Occupational Therapy

## 2024-03-20 ENCOUNTER — Ambulatory Visit

## 2024-03-20 DIAGNOSIS — I69119 Unspecified symptoms and signs involving cognitive functions following nontraumatic intracerebral hemorrhage: Secondary | ICD-10-CM | POA: Insufficient documentation

## 2024-03-20 DIAGNOSIS — I69118 Other symptoms and signs involving cognitive functions following nontraumatic intracerebral hemorrhage: Secondary | ICD-10-CM | POA: Diagnosis present

## 2024-03-20 DIAGNOSIS — R278 Other lack of coordination: Secondary | ICD-10-CM | POA: Diagnosis present

## 2024-03-20 DIAGNOSIS — M6281 Muscle weakness (generalized): Secondary | ICD-10-CM | POA: Diagnosis present

## 2024-03-20 DIAGNOSIS — R2689 Other abnormalities of gait and mobility: Secondary | ICD-10-CM | POA: Diagnosis present

## 2024-03-20 DIAGNOSIS — R262 Difficulty in walking, not elsewhere classified: Secondary | ICD-10-CM | POA: Insufficient documentation

## 2024-03-20 DIAGNOSIS — R2681 Unsteadiness on feet: Secondary | ICD-10-CM

## 2024-03-20 NOTE — Therapy (Signed)
 OUTPATIENT PHYSICAL THERAPY NEURO EVALUATION   Patient Name: Micheal Ayers MRN: [id number] DOB:Apr 12, 1961, 63 y.o., male Today's Date: 03/20/2024   PCP: Olin Bertin, MD REFERRING PROVIDER: Zelda Hickman, PA-C  END OF SESSION:  PT End of Session - 03/20/24 0845     Visit Number 1    Number of Visits 13    Date for PT Re-Evaluation 05/01/24    Authorization Type [employer]    PT Start Time 0845    PT Stop Time 0930    PT Time Calculation (min) 45 min             Past Medical History:  Diagnosis Date   ADD (attention deficit disorder)    Allergic rhinitis, cause unspecified    Chronic kidney disease    Acute renal failure   Hemochromatosis, hereditary (HCC)    seen at [facility]   Hypertension    Major depressive disorder    Stroke [facility])    Past Surgical History:  Procedure Laterality Date   CRANIOTOMY Left 02/24/2024   Procedure: CRANIOTOMY HEMATOMA EVACUATION SUBDURAL;  Surgeon: Joaquin Mulberry, MD;  Location: [facility] OR;  Service: Neurosurgery;  Laterality: Left;   elbow surgury  1969   left elbow   INGUINAL HERNIA REPAIR  2003   left   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/2023   Procedure: Shunt Placment - right occipital;  Surgeon: Agustina Aldrich, MD;  Location: [facility] OR;  Service: Neurosurgery;  Laterality: Right;   Patient Active Problem List   Diagnosis Date Noted   Hyponatremia 03/17/2024   GERD (gastroesophageal reflux disease) 03/17/2024   Anxiety disorder with panic attacks 03/17/2024   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 03/11/2024   Subdural hematoma (HCC) 02/24/2024   S/P craniotomy 02/24/2024   Obstructive hydrocephalus (HCC) 03/13/2023   Ischemic cerebrovascular accident (CVA) (HCC) 02/19/2023   Cerebral ventriculomegaly 02/19/2023   Acute renal failure superimposed on stage 2 chronic kidney disease (HCC) 02/19/2023   Hypertensive urgency 02/19/2023   Abnormal CXR 02/19/2023   CVA (cerebral vascular accident) (HCC)  02/19/2023   Fatigue 09/20/2012   Family history of hemochromatosis 09/20/2012   Preventative health care 09/06/2012   ADD (attention deficit disorder)    Allergic rhinitis     ONSET DATE: 02/19/24  REFERRING DIAG:  W09.811 (ICD-10-CM) - 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 Was at inpatient rehab x 1.5 weeks and has now been back home x 1 week. Notes ongoing feeling of clumsy and moving in slow motion.  Has been using a walker occasionally in the AM when feeling off balanced.  About a year ago was found to have hydrocephalus and had a shunt placed at that time but had noticed worsening memory and apraxia during that time.  Pt accompanied by: self  PERTINENT HISTORY: 63 y.o. male with history of CVA 01/2023, obstructive hydrocephalus status post VP shunt 02/2023, chronic/GAD, panic attacks, low back to 02/24/2024 with 2-day history of right facial droop and difficulty DH with signs of septations and small subacute right parafalcine SDH with midline shift.  He underwent left for evacuation of bleed by Dr. Rochelle Chu.  Postop placed on Decadron  as well as Keppra  for seizure prophylaxis.  Follow-up CT showed resolution of midline shift Decadron  was discharged.  Therapy was working with patient who was noted to have balance deficits with decreasing coordination on right, difficulty weightbearing through RLE as well as tendency to drift when walking.  He was independent  working prior to admission.  CIR was recommended due to functional decline.  PAIN:  Are you having pain? Yes: NPRS scale: 2/10 Pain location: behind eyes Pain description: pressure Aggravating factors:   Relieving factors:    PRECAUTIONS: Other:   Special Instructions: No driving, strenuous activity or return to work till cleared by MD. Recommend repeat check BMET in 1-2 weeks to monitor sodium levels  RED FLAGS: None   WEIGHT BEARING RESTRICTIONS: No  FALLS: Has patient fallen in last 6 months? No  LIVING ENVIRONMENT: Lives with: lives with their family and lives with their spouse Lives in: House/apartment Stairs: Yes: Internal: flight steps; can reach both Has following equipment at home: Otho Blitz - 2 wheeled Master on main floor. PLOF: Independent,   PATIENT GOALS: Improve balance, walking, and mobility  OBJECTIVE:  Note: Objective measures were completed at Evaluation unless otherwise noted.  DIAGNOSTIC FINDINGS:  IMPRESSION: 1. Increasing postoperative Pneumocephalus, now affecting both anterior frontal lobes (see series 4, image 17). 2. Subdural drain removed with interval increased residual Left Subdural Hematoma; 12 mm maximal thickness now vs 6-8 mm on 02/25/2022. Small volume para falcine SDH unchanged. 3. No significant midline shift. Stable basilar cisterns. Stable CSF shunt and ventriculomegaly.  COGNITION: Overall cognitive status: History of cognitive impairments - at baseline per pt report since onset of hydrocephalus having increased difficulty with conversation and recall   SENSATION: WFL  COORDINATION: Difficulty with rapid alternating movements on right Finger to nose WNL Heel to shin WNL  EDEMA:  none  MUSCLE TONE: WNL    POSTURE: No Significant postural limitations  LOWER EXTREMITY ROM:     Active  Right Eval Left Eval  Hip flexion    Hip extension    Hip abduction    Hip adduction    Hip internal rotation    Hip external  rotation    Knee flexion    Knee extension    Ankle dorsiflexion 8 15  Ankle plantarflexion    Ankle inversion    Ankle eversion     (Blank rows = not tested)  LOWER EXTREMITY MMT:    MMT Right Eval Left Eval  Hip flexion 4 5  Hip extension    Hip abduction 3 5  Hip adduction 4 5  Hip internal rotation    Hip external rotation  Knee flexion 4 5  Knee extension 4 5  Ankle dorsiflexion 3+ 5  Ankle plantarflexion    Ankle inversion    Ankle eversion    (Blank rows = not tested)  BED MOBILITY:  Not tested  TRANSFERS: Independent Floor to stand: NT    CURB:  Findings: CGA  STAIRS: Findings: Level of Assistance: SBA, Number of Stairs: 6, Height of Stairs: 4-6"   , and Comments: BHR GAIT: Findings: Distance walked: 150, Level of assistance: Modified independence, and Comments: right lateral trunk lean in stance phase  FUNCTIONAL TESTS:  5 times sit to stand: 18.60 sec Timed up and go (TUG): 11.62 sec; TUG cog: 20.75 sec Berg Balance Scale: 43/56 10 meter walk test: 12.22 sec = 2.68 ft/sec  M-CTSIB  Condition 1: Firm Surface, EO 30 Sec, Mild Sway  Condition 2: Firm Surface, EC 30 Sec, Mild and Moderate Sway  Condition 3: Foam Surface, EO 30 Sec, Mild and Moderate Sway  Condition 4: Foam Surface, EC 20 Sec, Moderate and Severe Sway                                                                                                                                  TREATMENT DATE: 03/20/24    PATIENT EDUCATION: Education details: assessment details, rationale of PT intervention, discussion of benefit of gradual increase of cardiovascular activities for general health and improved activity tolerance/function Person educated: Patient Education method: Explanation Education comprehension: verbalized understanding  HOME EXERCISE PROGRAM: TBD  GOALS: Goals reviewed with patient? Yes  SHORT TERM GOALS: Target date: 04/10/2024    Patient will be independent in  HEP to improve functional outcomes Baseline: Goal status: INITIAL  2.  Demo improved BLE strength and reduced risk for falls per time 15 sec 5xSTS test Baseline: 18 sec Goal status: INITIAL  3.  Improve safety and access to home environment navigating flight of stairs modified independence Baseline: SBA Goal status: INITIAL  4.  Improve proprioceptive awareness and postural stability for improved safety in ADL by mild sway condition 4 M-CTSIB Baseline: mod-severe x 20 sec Goal status: INITIAL    LONG TERM GOALS: Target date: 05/01/2024    Demo 10% or less difference TUG test with cognitive dual-task component to improve safety with mobility Baseline: 78% (TUG reg 11.62; TUG cog 20.75 sec) Goal status: INITIAL  2.  Demo low risk for falls per score 52/56 Berg Balance Test Baseline: 43/56 Goal status: INITIAL  3.  Increase gait speed to 3.0 ft/sec to improve efficiency of community ambulation Baseline: 2.68 ft/sec Goal status: INITIAL  4.  Demo independent ambulation community level distances over various surfaces to improve safety in environment Baseline: modified indep level, supervision-SBA uneven Goal status: INITIAL  5.  Independent floor to stand transfers for improved functional mobility Baseline: NT Goal status: INITIAL    ASSESSMENT:  CLINICAL IMPRESSION: Patient is a 63 y.o. male who was seen today for  physical therapy evaluation and treatment for deficits and limitations following recent hx of CVA.  Exhibits weakness affecting right UE/LE and manifests reduced coordination and motor control to this side with subsequent gait deviations and reduced ambulation speed.  High risk for falls evident by fall risk measures and reduced postural stability/proprioceptive awareness under conditions of M-CTSIB.  Requires caregiver/therapist SBA for stair/curb negotiation and cues in safety/awareness.  Patient would benefit from PT interventions to provide relevant restorative,  adaptive, and compensatory strategies to improve functional mobility and reduce risk for falls to facilitate return to PLOF.    OBJECTIVE IMPAIRMENTS: Abnormal gait, decreased activity tolerance, decreased balance, decreased coordination, decreased endurance, decreased mobility, difficulty walking, decreased ROM, and decreased strength.   ACTIVITY LIMITATIONS: carrying, lifting, bending, squatting, stairs, reach over head, and locomotion level  PARTICIPATION LIMITATIONS: meal prep, cleaning, laundry, interpersonal relationship, driving, shopping, community activity, occupation, and yard work  PERSONAL FACTORS: Age, Time since onset of injury/illness/exacerbation, and 1-2 comorbidities: hx of hydrocephalus, HTN  are also affecting patient's functional outcome.   REHAB POTENTIAL: Excellent  CLINICAL DECISION MAKING: Evolving/moderate complexity  EVALUATION COMPLEXITY: Moderate  PLAN:  PT FREQUENCY: 2x/week  PT DURATION: 6 weeks  PLANNED INTERVENTIONS: 97750- Physical Performance Testing, 97110-Therapeutic exercises, 97530- Therapeutic activity, [id number]- Neuromuscular re-education, 97535- Self Care, 40981- Manual therapy, [id number]- Gait training, [phone]- Orthotic Initial, [phone]- Orthotic/Prosthetic subsequent, [phone]- Aquatic Therapy, and DME instructions  PLAN FOR NEXT SESSION: est HEP for strength/balance, education on cardio measures for self-assessment (HR and RPE)   9:53 AM, 03/20/24 M. Kelly Hemi Chacko, PT, DPT Physical Therapist- [place] Office Number: [phone]

## 2024-03-20 NOTE — Therapy (Signed)
 OUTPATIENT OCCUPATIONAL THERAPY NEURO EVALUATION  Patient Name: Micheal Ayers MRN: [id number] DOB:[birth year], 63 y.o., male Today's Date: 03/20/2024  PCP: Olin Bertin, MD REFERRING PROVIDER: Zelda Hickman, PA-C  END OF SESSION:  OT End of Session - 03/20/24 1019     Visit Number 1    Number of Visits 13    Date for OT Re-Evaluation 05/02/24    Authorization Type [employer] - 60 visits combined (OT/PT/ST)    OT Start Time 0930    OT Stop Time 1015    OT Time Calculation (min) 45 min             Past Medical History:  Diagnosis Date   ADD (attention deficit disorder)    Allergic rhinitis, cause unspecified    Chronic kidney disease    Acute renal failure   Hemochromatosis, hereditary (HCC)    seen at [facility]   Hypertension    Major depressive disorder    Stroke [facility])    Past Surgical History:  Procedure Laterality Date   CRANIOTOMY Left 02/24/2024   Procedure: CRANIOTOMY HEMATOMA EVACUATION SUBDURAL;  Surgeon: Joaquin Mulberry, MD;  Location: [facility] OR;  Service: Neurosurgery;  Laterality: Left;   elbow surgury  1969   left elbow   INGUINAL HERNIA REPAIR  2003   left   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/2023   Procedure: Shunt Placment - right occipital;  Surgeon: Agustina Aldrich, MD;  Location: [facility] OR;  Service: Neurosurgery;  Laterality: Right;   Patient Active Problem List   Diagnosis Date Noted   Hyponatremia 03/17/2024   GERD (gastroesophageal reflux disease) 03/17/2024   Anxiety disorder with panic attacks 03/17/2024   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 03/11/2024   Subdural hematoma (HCC) 02/24/2024   S/P craniotomy 02/24/2024   Obstructive hydrocephalus (HCC) 03/13/2023   Ischemic cerebrovascular accident (CVA) (HCC) 02/19/2023   Cerebral ventriculomegaly 02/19/2023   Acute renal failure superimposed on stage 2 chronic kidney disease (HCC) 02/19/2023   Hypertensive urgency 02/19/2023   Abnormal CXR 02/19/2023   CVA  (cerebral vascular accident) (HCC) 02/19/2023   Fatigue 09/20/2012   Family history of hemochromatosis 09/20/2012   Preventative health care 09/06/2012   ADD (attention deficit disorder)    Allergic rhinitis     ONSET DATE: 02/24/24  REFERRING DIAG: W09.811 (ICD-10-CM) - 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  Pt reports getting more and more confused and "disconnected".  Pt reports 02/24/24 not feeling well and going to [facility], then being transferred to [facility] and had surgery the next morning.  Pt reports now that he is home "everything is a process".  Pt reports being diagnosed with hydrocephalus and shunt placement 02/2023, but that this "next piece" has been more difficult, more emotional.   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 He was found to have massive  left SDH with signs of septations and small subacute right parafalcine SDH with midline shift. He was evaluated by Dr. Rochelle Chu and underwent left fronto-temporoparietal crani for evacuation of large chronic LDH the same day. He was placed on decadron  and on Keppra  for seizure prophylaxis and serial CT head showed increaseing post op pneumocephalus with residual L-SDH  decreased to 6-8 mm from 12 mm and resolution of medline shift.  PRECAUTIONS: None  WEIGHT BEARING RESTRICTIONS: No  PAIN:  Are you having pain? Yes: NPRS scale: 2/10 Pain location: "behind the eyes" Pain description: increased pressure Aggravating factors: unsure Relieving factors: none stated  FALLS: Has patient fallen in last 6 months? 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  HAND DOMINANCE: Right  ADLs: Overall ADLs: Mod I - reports more time consuming overall, mild difficulty with getting in/out of shower but no physical assistance Transfers/ambulation related to ADLs: Mod I - using RW 50% of time in the home and when walking in the cul-de-sac Equipment: Grab bars, Walk in shower, and hand held shower head  IADLs: Light housekeeping: is not currently performing and wife did majority even prior Meal Prep: is not currently performing and wife did majority even prior Community mobility: not currently cleared to drive  Medication management: spouse is assisting pt with taking meds appropriately Financial management: spouse is managing bills at this time Handwriting: 100% legible completed PPT #1([place]): 11.66 sec  MOBILITY STATUS:  slower and decreased endurance  POSTURE COMMENTS:  No Significant postural limitations  ACTIVITY TOLERANCE: Activity tolerance:  diminished  FUNCTIONAL OUTCOME MEASURES: PSFS: 3.0   UPPER EXTREMITY ROM:  WFL bilaterally  UPPER EXTREMITY MMT:   grossly 4/5 bilaterally  COORDINATION: Finger Nose Finger test: mild dysmetria on R and mildly slower compared to L 9 Hole Peg test: Right: 24.53 sec; Left: 29.06 sec Box and Blocks:  Right 45 blocks, Left 43 blocks - frequently picking up 2 blocks but able to recall to only pick up one at a time RAM: pt with decreased motor control of RUE with rapid hand movements, demonstrating "tremor-like" movement on R  SENSATION: WFL  COGNITION: Overall cognitive status: No family/caregiver present to determine baseline cognitive functioning Brief Interview for Mental Status:   Memory:  Word Repetition:   "Sock"  "Blue"  "Bed"   Orientation Level:    Year:   2025  Month: April  Day of Week: Thursday  Memory:  Recall: Able to recall Blue and bed, unable to recall sock even when given a cue.      VISION: Subjective report: "I can't see far away" no changes Baseline vision: Wears glasses for distance only   VISION ASSESSMENT: Ocular ROM: WFL Gaze preference/alignment: WDL Tracking/Visual pursuits: Able to track stimulus in all quads without difficulty Saccades: WFL Visual Fields: no apparent deficits  OBSERVATIONS: Pt requiring increased time to answer questions and somewhat tangential when asked questions about pt impairments, possibly  demonstrating decreased awareness vs decreased memory.  Pt requiring increased time with 9 hole peg test and Box and blocks, most likely due to overall slowed processing as pt reports "everything is a process".                                                                                                                           TREATMENT DATE:  03/20/24 Educated on role and purpose of OT as well as potential interventions and goals for therapy based on initial evaluation findings.  Educating on attempts at engagement in household  tasks as able to increase both endurance as well as cognitive engagement/processing.     PATIENT EDUCATION: Education details: Educated on role and purpose of OT as well as potential interventions and goals for therapy based on initial evaluation findings. Person educated: Patient Education method: Explanation Education comprehension: needs further education  HOME EXERCISE PROGRAM: TBD   GOALS: Goals reviewed with patient? Yes  SHORT TERM GOALS: Target date: 04/11/24  Pt will be independent with fine and gross motor control HEP with use of visual handouts. Baseline: slower overall with fine and gross motor tasks Goal status: INITIAL  2.  Pt will verbalize understanding of task modifications and/or potential AE needs to increase ease, safety, and independence w/ ADLs and IADLs. Baseline: limited engagement in IADLs, slower overall with ADLs Goal status: INITIAL  3.  Pt will demonstrate improved memory, sequencing, and problem solving as demonstrated by improvements in score on pill box assessment by ability to complete in 5 mins time limit with </= to 2 errors.  Baseline: wife managing meds/ TBD Goal status: INITIAL  4.  Pt will verbalize understanding of energy conservation strategies.  Baseline: decreased endurance  Goal status: INITIAL   LONG TERM GOALS: Target date: 05/02/24  Pt will demonstrate understanding of memory compensations and ways to keep thinking skills sharp Baseline:  Goal status: INITIAL  2.  Pt will perform dynamic standing task for 10 mins as needed to complete simple IADLs w/o LOB using DME and/or countertop support prn. Baseline: decreased engagement in IADLs Goal status: INITIAL  3.  Pt will demonstrate ability to sequence simple functional task (simple snack prep, laundry task, etc) at Mod I level with good safety awareness and ability to utilize memory strategies for recall.  Baseline: decreased engagement in IADLs Goal status: INITIAL  4.  Pt  will navigate a moderately busy environment, completing dual task activity and/or following multi-step commands with 90% accuracy Baseline: impaired memory and thinking strategies and endurance impacting return to work Goal status: INITIAL  5.  Pt will be independent with utilization of memory strategies to allow for increased independence with managing personal appointments and in preparation for return to work. Baseline: decreased memory and thinking strategies Goal status: INITIAL  6.  Patient will report at least two-point increase in average PSFS score or at least three-point increase in a single activity score indicating functionally significant improvement given minimum  detectable change. Baseline: 3.0 Goal status: INITIAL  ASSESSMENT:  CLINICAL IMPRESSION: Patient is a 63 y.o. 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 Pt presents with reports of ability to complete ADLs with increased time but no physical assistance.  Pt demonstrating decreased motor control of dominant RUE and overall slowing during fine and gross motor control assessments.  Pt benefits from increased time for processing and initiation.  Pt currently lives with spouse in a 2 story townhome with Child psychotherapist on main floor and was working in Clinical biochemist prior to onset. Pt will benefit from skilled occupational therapy services to address coordination, pain management, balance, GM/FM control, cognition, safety awareness, introduction of compensatory strategies/AE prn, visual-perception, and implementation of an HEP to improve participation and safety during ADLs and IADLs.     PERFORMANCE DEFICITS: in functional skills including ADLs, IADLs, coordination, pain, Fine motor control, Gross motor control, balance, body mechanics, endurance, decreased knowledge of precautions, and UE functional use, cognitive skills including emotional,  energy/drive, memory, safety awareness, and thought, and psychosocial skills including coping strategies and routines and behaviors.   IMPAIRMENTS: are limiting patient from ADLs, IADLs, and work.   CO-MORBIDITIES: may have co-morbidities  that affects occupational performance. Patient will benefit from skilled OT to address above impairments and improve overall function.  MODIFICATION OR ASSISTANCE TO COMPLETE EVALUATION: Min-Moderate modification of tasks or assist with assess necessary to complete an evaluation.  OT OCCUPATIONAL PROFILE AND HISTORY: Detailed assessment: Review of records and additional review of physical, cognitive, psychosocial history related to current functional performance.  CLINICAL DECISION MAKING: Moderate - several treatment options, min-mod task modification necessary  REHAB POTENTIAL: Good  EVALUATION COMPLEXITY: Moderate    PLAN:  OT FREQUENCY: 2x/week  OT DURATION: 6 weeks  PLANNED INTERVENTIONS: 97168 OT Re-evaluation, 97535 self care/ADL training, 16109 therapeutic exercise, 97530 therapeutic activity, 97112 neuromuscular re-education, functional mobility training, visual/perceptual remediation/compensation, psychosocial skills training, energy conservation, coping strategies training, patient/family education, and DME and/or AE instructions  RECOMMENDED OTHER SERVICES: may benefit from SLP, TBD after additional session and assessment of SLUMS  CONSULTED AND AGREED WITH PLAN OF CARE: Patient  PLAN FOR NEXT SESSION: complete SLUMS, pill box assessment, provide education on memory compensation strategies and keeping thinking skills sharp   Anthonette Kinsman, OTR/L 03/20/2024, 11:36 AM   [facility] [address] [place], [place] [address] Phone # [phone] Fax # [phone]

## 2024-03-24 ENCOUNTER — Ambulatory Visit

## 2024-03-24 DIAGNOSIS — R2689 Other abnormalities of gait and mobility: Secondary | ICD-10-CM

## 2024-03-24 DIAGNOSIS — R2681 Unsteadiness on feet: Secondary | ICD-10-CM

## 2024-03-24 DIAGNOSIS — R262 Difficulty in walking, not elsewhere classified: Secondary | ICD-10-CM

## 2024-03-24 DIAGNOSIS — I69118 Other symptoms and signs involving cognitive functions following nontraumatic intracerebral hemorrhage: Secondary | ICD-10-CM | POA: Diagnosis not present

## 2024-03-24 DIAGNOSIS — M6281 Muscle weakness (generalized): Secondary | ICD-10-CM

## 2024-03-24 NOTE — Therapy (Signed)
 OUTPATIENT PHYSICAL THERAPY NEURO TREATMENT   Patient Name: Micheal Ayers MRN: [id number] DOB:Nov 13, 1961, 63 y.o., male Today's Date: 03/24/2024   PCP: Olin Bertin, MD REFERRING PROVIDER: Zelda Hickman, PA-C  END OF SESSION:  PT End of Session - 03/24/24 0917     Visit Number 2    Number of Visits 13    Date for PT Re-Evaluation 05/01/24    Authorization Type [employer]    Authorization Time Period 60 visit limit combined    Authorization - Visit Number 1    Authorization - Number of Visits 30    PT Start Time [phone]    PT Stop Time 1015    PT Time Calculation (min) 57 min             Past Medical History:  Diagnosis Date   ADD (attention deficit disorder)    Allergic rhinitis, cause unspecified    Chronic kidney disease    Acute renal failure   Hemochromatosis, hereditary (HCC)    seen at [facility]   Hypertension    Major depressive disorder    Stroke [facility])    Past Surgical History:  Procedure Laterality Date   CRANIOTOMY Left 02/24/2024   Procedure: CRANIOTOMY HEMATOMA EVACUATION SUBDURAL;  Surgeon: Joaquin Mulberry, MD;  Location: [facility] OR;  Service: Neurosurgery;  Laterality: Left;   elbow surgury  1969   left elbow   INGUINAL HERNIA REPAIR  2003   left   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/2023   Procedure: Shunt Placment - right occipital;  Surgeon: Agustina Aldrich, MD;  Location: [facility] OR;  Service: Neurosurgery;  Laterality: Right;   Patient Active Problem List   Diagnosis Date Noted   Hyponatremia 03/17/2024   GERD (gastroesophageal reflux disease) 03/17/2024   Anxiety disorder with panic attacks 03/17/2024   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 03/11/2024   Subdural hematoma (HCC) 02/24/2024   S/P craniotomy 02/24/2024   Obstructive hydrocephalus (HCC) 03/13/2023   Ischemic cerebrovascular accident (CVA) (HCC) 02/19/2023   Cerebral ventriculomegaly 02/19/2023   Acute renal failure superimposed on stage 2 chronic kidney  disease (HCC) 02/19/2023   Hypertensive urgency 02/19/2023   Abnormal CXR 02/19/2023   CVA (cerebral vascular accident) (HCC) 02/19/2023   Fatigue 09/20/2012   Family history of hemochromatosis 09/20/2012   Preventative health care 09/06/2012   ADD (attention deficit disorder)    Allergic rhinitis     ONSET DATE: 02/19/24  REFERRING DIAG:  J19.417 (ICD-10-CM) - 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 male with history of CVA 01/2023, obstructive hydrocephalus status post VP shunt 02/2023, chronic/GAD, panic attacks, low back to 02/24/2024 with 2-day history of right facial droop and difficulty DH with signs of septations and small subacute right parafalcine SDH with midline shift.  He underwent left for evacuation of bleed by Dr. Rochelle Chu.  Postop placed on Decadron  as well as Keppra  for seizure prophylaxis.  Follow-up CT showed resolution of midline shift Decadron  was discharged.  Therapy was working with patient who was noted to have balance deficits with decreasing coordination on right, difficulty weightbearing through RLE as well as tendency to drift when walking.  He was independent working prior to admission.  CIR was recommended due to functional decline.  PAIN:  Are you having pain? Yes: NPRS scale: 2/10 Pain location: behind eyes Pain description: pressure Aggravating factors:   Relieving factors:    PRECAUTIONS: Other:   Special  Instructions: No driving, strenuous activity or return to work till cleared by MD. Recommend repeat check BMET in 1-2 weeks to monitor sodium levels  RED FLAGS: None   WEIGHT BEARING RESTRICTIONS: No  FALLS: Has patient fallen in last 6 months? No  LIVING ENVIRONMENT: Lives with: lives with their family and lives with their spouse Lives in: House/apartment Stairs: Yes: Internal: flight steps; can reach both Has following equipment at home: Otho Blitz - 2 wheeled Master on main floor. PLOF: Independent,   PATIENT GOALS: Improve balance, walking, and mobility  OBJECTIVE:   TODAY'S TREATMENT: 03/24/24 Activity Comments  Vitals: 124/88 mmHg, 96 bpm   Gait training Wide based SPC Trekking pole  Corner balance -feet together EO/EC x 30 sec. Head turns 3x EO/EC -semi-tandem x 30 sec  Sit to stand stride stance RLE 2x10   4-square step w/ trekking pole -Tape lines -canes -canes + airex pad and 6" hurdle  Kicking physioball x 2 min For weight shift/SLS  NU-step intervals x 8 min 2 min warm-up 30 sec sprint; 60 sec recovery HR    PATIENT EDUCATION: Education details: assessment details, rationale of PT intervention, discussion of benefit of gradual increase of cardiovascular activities for general health and improved activity tolerance/function Person educated: Patient Education method: Explanation Education comprehension: verbalized understanding  HOME EXERCISE PROGRAM: Access Code: [email or web address] URL: [email or web address] Date: 03/24/2024 Prepared by: Kelly Maeve Debord  Exercises - Corner Balance Feet Together With Eyes Open  - 1 x daily - 7 x weekly - 3 sets - 30 sec hold - Corner Balance Feet Together With Eyes Closed  - 1 x daily - 7 x weekly - 3 sets - 30 sec hold - Corner Balance Feet Together: Eyes Open With Head Turns  - 1 x daily - 7 x weekly - 3 sets - 3 reps - Corner Balance Feet Together: Eyes Closed With Head Turns  - 1 x daily - 7 x weekly - 3 sets - 3  reps - Semi-Tandem Corner Balance With Eyes Open  - 1 x daily - 7 x weekly - 3 sets - 30 sec hold - Sit to Stand in Stride with AFO and UE Assist in LE Alignment  - 1 x daily - 7 x weekly - 3 sets - 10 reps  Note: Objective measures were completed at Evaluation unless otherwise noted.  DIAGNOSTIC FINDINGS:  IMPRESSION: 1. Increasing postoperative Pneumocephalus, now affecting both anterior frontal lobes (see series 4, image 17). 2. Subdural drain removed with interval  increased residual Left Subdural Hematoma; 12 mm maximal thickness now vs 6-8 mm on 02/25/2022. Small volume para falcine SDH unchanged. 3. No significant midline shift. Stable basilar cisterns. Stable CSF shunt and ventriculomegaly.  COGNITION: Overall cognitive status: History of cognitive impairments - at baseline per pt report since onset of hydrocephalus having increased difficulty with conversation and recall   SENSATION: WFL  COORDINATION: Difficulty with rapid alternating movements on right Finger to nose WNL Heel to shin WNL  EDEMA:  none  MUSCLE TONE: WNL    POSTURE: No Significant postural limitations  LOWER EXTREMITY ROM:     Active  Right Eval Left Eval  Hip flexion    Hip extension    Hip abduction    Hip adduction    Hip internal rotation    Hip external rotation    Knee flexion    Knee extension    Ankle dorsiflexion 8 15  Ankle plantarflexion    Ankle inversion    Ankle eversion     (Blank rows = not tested)  LOWER EXTREMITY MMT:    MMT Right Eval Left Eval  Hip flexion 4 5  Hip extension    Hip abduction 3 5  Hip adduction 4 5  Hip internal rotation    Hip external rotation    Knee flexion 4 5  Knee extension 4 5  Ankle dorsiflexion 3+ 5  Ankle plantarflexion    Ankle inversion    Ankle eversion    (Blank rows = not tested)  BED MOBILITY:  Not tested  TRANSFERS: Independent Floor to stand: NT    CURB:  Findings: CGA  STAIRS: Findings: Level of  Assistance: SBA, Number of Stairs: 6, Height of Stairs: 4-6"   , and Comments: BHR GAIT: Findings: Distance walked: 150, Level of assistance: Modified independence, and Comments: right lateral trunk lean in stance phase  FUNCTIONAL TESTS:  5 times sit to stand: 18.60 sec Timed up and go (TUG): 11.62 sec; TUG cog: 20.75 sec Berg Balance Scale: 43/56 10 meter walk test: 12.22 sec = 2.68 ft/sec  M-CTSIB  Condition 1: Firm Surface, EO 30 Sec, Mild Sway  Condition 2: Firm Surface, EC 30 Sec, Mild and Moderate Sway  Condition 3: Foam Surface, EO 30 Sec, Mild and Moderate Sway  Condition 4: Foam Surface, EC 20 Sec, Moderate and Severe Sway                                                                                                                                  TREATMENT DATE: 03/20/24      GOALS: Goals reviewed with patient? Yes  SHORT TERM GOALS: Target date: 04/10/2024    Patient will be independent in HEP to improve functional outcomes Baseline: Goal status: INITIAL  2.  Demo improved BLE strength and reduced risk for falls per time 15 sec 5xSTS test Baseline: 18 sec Goal status: INITIAL  3.  Improve safety  and access to home environment navigating flight of stairs modified independence Baseline: SBA Goal status: INITIAL  4.  Improve proprioceptive awareness and postural stability for improved safety in ADL by mild sway condition 4 M-CTSIB Baseline: mod-severe x 20 sec Goal status: INITIAL    LONG TERM GOALS: Target date: 05/01/2024    Demo 10% or less difference TUG test with cognitive dual-task component to improve safety with mobility Baseline: 78% (TUG reg 11.62; TUG cog 20.75 sec) Goal status: INITIAL  2.  Demo low risk for falls per score 52/56 Berg Balance Test Baseline: 43/56 Goal status: INITIAL  3.  Increase gait speed to 3.0 ft/sec to improve efficiency of community ambulation Baseline: 2.68 ft/sec Goal status: INITIAL  4.  Demo independent  ambulation community level distances over various surfaces to improve safety in environment Baseline: modified indep level, supervision-SBA uneven Goal status: INITIAL  5.  Independent floor to stand transfers for improved functional mobility Baseline: NT Goal status: INITIAL    ASSESSMENT:  CLINICAL IMPRESSION: Initiated activities with gait training in use of less restrictive AD such as cane/trekking pole and SBA-CGA for cues in sequence for improving step/stride length. Instructed in activities for HEP development with emphasis on static balance and postural stability/proprioceptive awareness with difficulty maintaining stability eyes closed+head movements. Reinforced single limb support and balance strategies with 4-square step activities to improve foot clearance over obstacles and compliant surfaces with difficulty in achieivng right step length for adequate spacing with a few instances of LOB with righting reactions to recover. Dynamic standing balance to enhance weight shift, change of direction and single limb support. Instructed in NU-step for cardiovacular exercise and benefit of rapid alternating movements with intervall training implemented.  Tolerated session quite well and recommend use of cane or trekking pole for gait. Continued sessions to progress POC details to improve mobility and reduce risk for falls.    OBJECTIVE IMPAIRMENTS: Abnormal gait, decreased activity tolerance, decreased balance, decreased coordination, decreased endurance, decreased mobility, difficulty walking, decreased ROM, and decreased strength.   ACTIVITY LIMITATIONS: carrying, lifting, bending, squatting, stairs, reach over head, and locomotion level  PARTICIPATION LIMITATIONS: meal prep, cleaning, laundry, interpersonal relationship, driving, shopping, community activity, occupation, and yard work  PERSONAL FACTORS: Age, Time since onset of injury/illness/exacerbation, and 1-2 comorbidities: hx of  hydrocephalus, HTN  are also affecting patient's functional outcome.   REHAB POTENTIAL: Excellent  CLINICAL DECISION MAKING: Evolving/moderate complexity  EVALUATION COMPLEXITY: Moderate  PLAN:  PT FREQUENCY: 2x/week  PT DURATION: 6 weeks  PLANNED INTERVENTIONS: 97750- Physical Performance Testing, 97110-Therapeutic exercises, 97530- Therapeutic activity, [id number]- Neuromuscular re-education, 97535- Self Care, 60454- Manual therapy, [id number]- Gait training, [phone]- Orthotic Initial, [phone]- Orthotic/Prosthetic subsequent, [phone]- Aquatic Therapy, and DME instructions  PLAN FOR NEXT SESSION: est HEP for strength/balance, education on cardio measures for self-assessment (HR and RPE)   9:18 AM, 03/24/24 M. Kelly Klayten Jolliff, PT, DPT Physical Therapist- [place] Office Number: [phone]

## 2024-03-25 ENCOUNTER — Encounter: Attending: Registered Nurse | Admitting: Registered Nurse

## 2024-03-25 ENCOUNTER — Ambulatory Visit: Admitting: Occupational Therapy

## 2024-03-25 ENCOUNTER — Encounter: Payer: Self-pay | Admitting: Registered Nurse

## 2024-03-25 VITALS — BP 106/74 | HR 100 | Ht 72.0 in | Wt 226.6 lb

## 2024-03-25 DIAGNOSIS — R278 Other lack of coordination: Secondary | ICD-10-CM

## 2024-03-25 DIAGNOSIS — Z9889 Other specified postprocedural states: Secondary | ICD-10-CM | POA: Diagnosis present

## 2024-03-25 DIAGNOSIS — S065XAA Traumatic subdural hemorrhage with loss of consciousness status unknown, initial encounter: Secondary | ICD-10-CM

## 2024-03-25 DIAGNOSIS — I69119 Unspecified symptoms and signs involving cognitive functions following nontraumatic intracerebral hemorrhage: Secondary | ICD-10-CM

## 2024-03-25 DIAGNOSIS — I69118 Other symptoms and signs involving cognitive functions following nontraumatic intracerebral hemorrhage: Secondary | ICD-10-CM

## 2024-03-25 DIAGNOSIS — I10 Essential (primary) hypertension: Secondary | ICD-10-CM | POA: Diagnosis not present

## 2024-03-25 NOTE — Progress Notes (Signed)
 Subjective:    Patient ID: [name], male    DOB: June 22, 1961, 63 y.o.   MRN: [id number]  HPI: Micheal Ayers is a 63 y.o. male who is here HFU appointment for follow up of  his Subdural hematoma, S/P Craniotomy,  Cognitive Deficits following non traumatic intracerebral hemorrhage and Essential Hypertension. He was admitted on 02/24/2024,for left chronic subdural hematoma,  Dr. Rochelle Chu H& P  Note:  Patient is a 63 y.o. male admitted for left chronic subdural hematoma. Onset of symptoms was 2 days ago, stable since that time.  The pain is rated none.  He denies trauma or head injury or falls.  He has had 2 days worth of right facial droop and some difficulty with word finding and decreased speech output.  MRI or CT showed large left chronic subdural hematoma.  Right parietal shunt is in place.  He had a right parietal shunt placed by Dr. Adonis Alamin 1 year ago.  He underwent CRANIOTOMY HEMATOMA EVACUATION SUBDURAL : on 02/24/2024: Dr Rochelle Chu   CT Head: WO Contrast: IMPRESSION: 1. Massive subdural hematoma on the left, primarily low-density with signs of septation. Maximal thickness along the left frontal convexity measures nearly 5 cm with 1.5 cm of midline shift. 2. Small subacute appearing right parafalcine subdural hematoma measuring up to 5 mm in thickness. 3. VP shunt. Decrease in ventricular volume least partially from the mass effect but there may also be increased drainage.   CTA:  IMPRESSION: 1. No emergent arterial finding. No vascular lesion seen underlying the subdural hemorrhage. 2. 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   Mr. Shows was admitted to inpatient Rehabilitation on 03/03/2024. He was discharged home on 03/13/2024. He is receeiving outpatient therapy at Neuro rehabilitation. He denies any pain. He rates his pain 0. Also reports he has a good appetite.   Pain Inventory Average Pain 2 Pain Right Now 0 My pain is intermittent  LOCATION OF PAIN   head  BOWEL Number of stools per week: 4 Oral laxative use Yes  Type of laxative miralax  as needed  BLADDER Normal Incomplete bladder emptying  sometimes   Mobility walk without assistance how many minutes can you walk? 10-15 ability to climb steps?  yes do you drive?  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  no  has seen PCP and BP med changed  Physicians involved in your care Any changes since last visit?  no   Family History  Problem Relation Age of Onset   Hypertension Other    Alcohol abuse Other    Hypertension Other    Kidney disease Other    Cancer Other    Alcohol abuse Other    Arthritis Other    Stroke Other    Hypertension Other    Cancer Other    Social History   Socioeconomic History   Marital status: Married    Spouse name: Not on file   Number of children: Not on file   Years of education: Not on file   Highest education level: Not on file  Occupational History   Not on file  Tobacco Use   Smoking status: Former   Smokeless tobacco: Never  Vaping Use  Vaping status: Never Used  Substance and Sexual Activity   Alcohol use: Yes    Comment: social   Drug use: No   Sexual activity: Not on file  Other Topics Concern   Not on file  Social History Narrative   Not on file   Social Drivers of Health   Financial Resource Strain: Not on file  Food Insecurity: No Food Insecurity (02/24/2024)   Hunger Vital Sign    Worried About Running Out of Food in the Last Year: Never true    Ran Out of Food in the Last Year: Never true  Transportation Needs: No Transportation Needs (02/24/2024)   PRAPARE - Therapist, art (Medical): No    Lack of Transportation (Non-Medical): No  Physical Activity: Not on file  Stress: Not on file  Social Connections: Not on file   Past Surgical History:  Procedure Laterality Date   CRANIOTOMY Left 02/24/2024   Procedure: CRANIOTOMY HEMATOMA EVACUATION SUBDURAL;  Surgeon: Joaquin Mulberry, MD;  Location: [facility] OR;  Service: Neurosurgery;  Laterality: Left;   elbow surgury  1969   left elbow   INGUINAL HERNIA REPAIR  2003   left   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/2023   Procedure: Shunt Placment - right occipital;  Surgeon: Agustina Aldrich, MD;  Location: [facility] OR;  Service: Neurosurgery;  Laterality: Right;   Past Medical History:  Diagnosis Date   ADD (attention deficit disorder)    Allergic rhinitis, cause unspecified    Chronic kidney disease    Acute renal failure   Hemochromatosis, hereditary (HCC)    seen at [facility]   Hypertension    Major depressive disorder    Stroke (HCC)    BP 106/74   Pulse (!) 101   Ht 6' (1.829 m)   Wt 226 lb 9.6 oz (102.8 kg)   SpO2 96%   BMI 30.73 kg/m   Opioid Risk Score:   Fall Risk Score:  `1  Depression screen [facility] 2/9     03/25/2024    1:42 PM  Depression screen PHQ 2/9  Decreased Interest 2  Down, Depressed, Hopeless 2  PHQ - 2 Score 4  Altered sleeping 1  Tired, decreased energy 3  Change in appetite 1  Feeling bad or failure about yourself  3  Trouble concentrating 2  Moving slowly or fidgety/restless 3  Suicidal thoughts 1  PHQ-9 Score 18  Difficult doing work/chores Somewhat difficult     Review of Systems  Musculoskeletal:  Positive for gait problem.  Neurological:  Positive for dizziness, numbness and headaches.  Psychiatric/Behavioral:  Positive for confusion and dysphoric mood. The patient is nervous/anxious.   All other systems reviewed and are negative.      Objective:   Physical Exam Vitals and nursing note reviewed.  Constitutional:      Appearance: Normal  appearance.  Cardiovascular:     Rate and Rhythm: Normal rate and regular rhythm.     Pulses: Normal pulses.     Heart sounds: Normal heart sounds.  Pulmonary:     Effort: Pulmonary effort is normal.     Breath sounds: Normal breath sounds.  Musculoskeletal:     Comments: Normal Muscle Bulk and Muscle Testing Reveals:  Upper Extremities: Full ROM and Muscle Strengt 5/5 Lower Extremities: Full ROM and Muscle Strength 5/5 Arises from Table with Ease Narrow Based  Gait     Skin:    General: Skin is warm and dry.  Neurological:  Mental Status: He is alert and oriented to person, place, and time.  Psychiatric:        Mood and Affect: Mood normal.        Behavior: Behavior normal.          Assessment & Plan:  Subdural hematoma, S/P Craniotomy,  Cognitive Deficits following non traumatic intracerebral hemorrhage: Continue Outpatient Therapy with [facility]. He has a scheduled appointment with Neuro-Surgery. Continue to Monitor.   Essential Hypertension. Continue current medication regimen. Continue to Monitor. PCP following.   F/U with Dr Dorn Gaskins in 4- 6 weeks

## 2024-03-25 NOTE — Therapy (Signed)
 OUTPATIENT OCCUPATIONAL THERAPY NEURO  Treatment Note  Patient Name: Micheal Ayers MRN: [id number] DOB:March 24, 1961, 63 y.o., male Today's Date: 03/25/2024  PCP: Olin Bertin, MD REFERRING PROVIDER: Zelda Hickman, PA-C  END OF SESSION:  OT End of Session - 03/25/24 0913     Visit Number 2    Number of Visits 13    Date for OT Re-Evaluation 05/02/24    Authorization Type [employer] - 60 visits combined (OT/PT/ST)    OT Start Time [id number]    OT Stop Time 0933    OT Time Calculation (min) 44 min              Past Medical History:  Diagnosis Date   ADD (attention deficit disorder)    Allergic rhinitis, cause unspecified    Chronic kidney disease    Acute renal failure   Hemochromatosis, hereditary (HCC)    seen at [facility]   Hypertension    Major depressive disorder    Stroke [facility])    Past Surgical History:  Procedure Laterality Date   CRANIOTOMY Left 02/24/2024   Procedure: CRANIOTOMY HEMATOMA EVACUATION SUBDURAL;  Surgeon: Joaquin Mulberry, MD;  Location: [facility] OR;  Service: Neurosurgery;  Laterality: Left;   elbow surgury  1969   left elbow   INGUINAL HERNIA REPAIR  2003   left   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/2023   Procedure: Shunt Placment - right occipital;  Surgeon: Agustina Aldrich, MD;  Location: [facility] OR;  Service: Neurosurgery;  Laterality: Right;   Patient Active Problem List   Diagnosis Date Noted   Hyponatremia 03/17/2024   GERD (gastroesophageal reflux disease) 03/17/2024   Anxiety disorder with panic attacks 03/17/2024   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 03/11/2024   Subdural hematoma (HCC) 02/24/2024   S/P craniotomy 02/24/2024   Obstructive hydrocephalus (HCC) 03/13/2023   Ischemic cerebrovascular accident (CVA) (HCC) 02/19/2023   Cerebral ventriculomegaly 02/19/2023   Acute renal failure superimposed on stage 2 chronic kidney disease (HCC) 02/19/2023   Hypertensive urgency 02/19/2023   Abnormal CXR 02/19/2023    CVA (cerebral vascular accident) (HCC) 02/19/2023   Fatigue 09/20/2012   Family history of hemochromatosis 09/20/2012   Preventative health care 09/06/2012   ADD (attention deficit disorder)    Allergic rhinitis     ONSET DATE: 02/24/24  REFERRING DIAG: J19.147 (ICD-10-CM) - 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  Pt reports navigating the shower is improving, not taking as long, "doesn't feel like it is the process that it used to be".  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 He was found to have massive left SDH with signs of septations and small subacute right parafalcine SDH with midline shift. He was evaluated by Dr. Rochelle Chu and underwent left fronto-temporoparietal crani for evacuation of large chronic LDH the same day. He was placed on decadron  and on Keppra  for seizure prophylaxis and serial CT head showed increaseing post op pneumocephalus with residual L-SDH decreased to 6-8 mm from 12 mm and resolution of medline shift.  PRECAUTIONS: None  WEIGHT BEARING RESTRICTIONS: No  PAIN:  Are you having pain? No  FALLS: Has patient fallen in last 6 months? 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  HAND DOMINANCE: Right  ADLs: Overall ADLs: Mod I - reports more time consuming overall, mild difficulty with getting in/out of shower but no physical assistance Transfers/ambulation related to ADLs: Mod I - using RW 50% of time in the home and when walking in the cul-de-sac Equipment: Grab bars, Walk in shower, and hand held shower head  IADLs: Light housekeeping: is not currently performing and wife did majority even prior Meal Prep: is not currently performing and wife did majority even prior Community mobility: not currently cleared to drive  Medication management: spouse is assisting pt with taking meds appropriately Financial management: spouse is managing bills at this time Handwriting: 100% legible completed PPT #1([place]): 11.66 sec  MOBILITY STATUS:  slower and decreased endurance  POSTURE COMMENTS:  No Significant postural limitations  ACTIVITY TOLERANCE: Activity tolerance: diminished  FUNCTIONAL OUTCOME MEASURES: PSFS: 3.0   UPPER EXTREMITY ROM:  WFL bilaterally  UPPER EXTREMITY MMT:   grossly 4/5 bilaterally  COORDINATION: Finger Nose Finger test: mild dysmetria on R and mildly slower compared to L 9 Hole Peg test: Right: 24.53 sec; Left: 29.06 sec Box and Blocks:  Right 45 blocks, Left 43 blocks - frequently picking up 2 blocks but able to recall to only pick up one at a time RAM: pt with decreased motor control of RUE with rapid hand movements, demonstrating "tremor-like" movement on R  SENSATION: WFL  COGNITION: Overall cognitive status: No family/caregiver present to  determine baseline cognitive functioning Brief Interview for Mental Status:   Memory:  Word Repetition:   "Sock"  "Blue"  "Bed"   Orientation Level:    Year:   2025  Month: April  Day of Week: Thursday  Memory:  Recall: Able to recall Blue and bed, unable to recall sock even when given a cue.      VISION: Subjective report: "I can't see far away" no changes Baseline vision: Wears glasses for distance only   VISION ASSESSMENT: Ocular ROM: WFL Gaze preference/alignment: WDL Tracking/Visual pursuits: Able to track stimulus in all quads without difficulty Saccades: WFL Visual Fields: no apparent deficits  OBSERVATIONS: Pt requiring increased time to answer questions and somewhat tangential when asked questions about pt impairments, possibly demonstrating decreased awareness vs decreased memory.  Pt requiring increased time with 9 hole peg test and Box and blocks, most likely due to overall slowed processing as pt reports "everything is a process".  TREATMENT DATE:  03/25/24 SLUMS: pt scoring 25/30 with errors in recall of objects and ability to name animals in 1 minute.  Pt recalling 1/5 objects after delay and able to name 10 animals in one minute.  Memory strategies: OT educating pt on memory strategies with association, repeating, and "chunking" into categories - providing examples of each.  OT providing pt with additional suggestions with use of calendar (both written and digital), use of alarms/reminders, and list making. Pill box assessment: pt completed pill box test with 100% accuracy, in 6:39 minutes.  Pt demonstrates good sequencing and multi-tasking throughout task.  Pt demonstrating difficulty with opening variety of pill bottles and dropping 3 pills.  Educated on establishing a system and routine with meds to increase recall. A Measure of Executive  Functioning and Estimate of Medication Management. A straight pass/fail designation is determined by 3 or more errors of omission or misplacement on the task. The pt completed the test with 0 errors, but requiring greater than the 5 min time limit. FMC: provided pt with demonstration and rationale of in-hand manipulation and coordination tasks with focus on improving functional use.  OT providing with handout (see pt instructions) with instruction to complete 1,2,3 and 7.    03/20/24 Educated on role and purpose of OT as well as potential interventions and goals for therapy based on initial evaluation findings.  Educating on attempts at engagement in household tasks as able to increase both endurance as well as cognitive engagement/processing.     PATIENT EDUCATION: Education details: ongoing condition specific education, see above for education on memory strategies and medication management Person educated: Patient Education method: Explanation and Handouts Education comprehension: needs further education  HOME EXERCISE PROGRAM: 03/25/24 - memory strategies and coordination (see pt instructions)   GOALS: Goals reviewed with patient? Yes  SHORT TERM GOALS: Target date: 04/11/24  Pt will be independent with fine and gross motor control HEP with use of visual handouts. Baseline: slower overall with fine and gross motor tasks Goal status: in progress  2.  Pt will verbalize understanding of task modifications and/or potential AE needs to increase ease, safety, and independence w/ ADLs and IADLs. Baseline: limited engagement in IADLs, slower overall with ADLs Goal status: INITIAL  3.  Pt will demonstrate improved memory, sequencing, and problem solving as demonstrated by improvements in score on pill box assessment by ability to complete in 5 mins time limit with </= to 2 errors.  Baseline: wife managing meds/ TBD 03/25/24 - no errors, but required 6:39 to complete Goal status: in  progress  4.  Pt will verbalize understanding of energy conservation strategies.  Baseline: decreased endurance  Goal status: in progress   LONG TERM GOALS: Target date: 05/02/24  Pt will demonstrate understanding of memory compensations and ways to keep thinking skills sharp Baseline:  Goal status:  in progress  2.  Pt will perform dynamic standing task for 10 mins as needed to complete simple IADLs w/o LOB using DME and/or countertop support prn. Baseline: decreased engagement in IADLs Goal status:  in progress  3.  Pt will demonstrate ability to sequence simple functional task (simple snack prep, laundry task, etc) at Mod I level with good safety awareness and ability to utilize memory strategies for recall.  Baseline: decreased engagement in IADLs Goal status:  in progress  4.  Pt will navigate a moderately busy environment, completing dual task activity and/or following multi-step commands with 90% accuracy Baseline: impaired memory and thinking strategies and endurance  impacting return to work Goal status:  in progress  5.  Pt will be independent with utilization of memory strategies to allow for increased independence with managing personal appointments and in preparation for return to work. Baseline: decreased memory and thinking strategies Goal status:  in progress  6.  Patient will report at least two-point increase in average PSFS score or at least three-point increase in a single activity score indicating functionally significant improvement given minimum detectable change. Baseline: 3.0 Goal status:  in progress  ASSESSMENT:  CLINICAL IMPRESSION: Pt demonstrating good ability to engage in SLUMS and pill box assessment, demonstrating increased time with some problem solving tasks and recall but no errors made.  Pt did only recall 1 of 5 objects after short period of time.  Pt will continue to benefit from focus on memory, especially short term memory and establishing a  process or routine to increase recall.  Pt will continue benefit from skilled occupational therapy services to address coordination, pain management, balance, GM/FM control, cognition, safety awareness, introduction of compensatory strategies/AE prn, visual-perception, and implementation of an HEP to improve participation and safety during ADLs and IADLs.     PERFORMANCE DEFICITS: in functional skills including ADLs, IADLs, coordination, pain, Fine motor control, Gross motor control, balance, body mechanics, endurance, decreased knowledge of precautions, and UE functional use, cognitive skills including emotional, energy/drive, memory, safety awareness, and thought, and psychosocial skills including coping strategies and routines and behaviors.     PLAN:  OT FREQUENCY: 2x/week  OT DURATION: 6 weeks  PLANNED INTERVENTIONS: 97168 OT Re-evaluation, 97535 self care/ADL training, 69629 therapeutic exercise, 97530 therapeutic activity, 97112 neuromuscular re-education, functional mobility training, visual/perceptual remediation/compensation, psychosocial skills training, energy conservation, coping strategies training, patient/family education, and DME and/or AE instructions  RECOMMENDED OTHER SERVICES: may benefit from SLP, TBD after additional session and assessment of SLUMS  CONSULTED AND AGREED WITH PLAN OF CARE: Patient  PLAN FOR NEXT SESSION: provide education on memory compensation strategies and keeping thinking skills sharp  Start with coordination HEP and work on [employer] activities and recall strategies.   Anthonette Kinsman, OTR/L 03/25/2024, 12:44 PM   [facility] at [facility] [address] [place], [address] [place], [place] [address] Phone # [phone] Fax # [phone]

## 2024-03-25 NOTE — Patient Instructions (Signed)

## 2024-03-27 ENCOUNTER — Telehealth: Payer: Self-pay | Admitting: Occupational Therapy

## 2024-03-27 ENCOUNTER — Ambulatory Visit: Attending: Physical Medicine and Rehabilitation | Admitting: Occupational Therapy

## 2024-03-27 DIAGNOSIS — M6281 Muscle weakness (generalized): Secondary | ICD-10-CM | POA: Insufficient documentation

## 2024-03-27 DIAGNOSIS — R278 Other lack of coordination: Secondary | ICD-10-CM | POA: Diagnosis present

## 2024-03-27 DIAGNOSIS — R262 Difficulty in walking, not elsewhere classified: Secondary | ICD-10-CM | POA: Insufficient documentation

## 2024-03-27 DIAGNOSIS — I69119 Unspecified symptoms and signs involving cognitive functions following nontraumatic intracerebral hemorrhage: Secondary | ICD-10-CM | POA: Insufficient documentation

## 2024-03-27 DIAGNOSIS — R41841 Cognitive communication deficit: Secondary | ICD-10-CM | POA: Diagnosis present

## 2024-03-27 DIAGNOSIS — I69118 Other symptoms and signs involving cognitive functions following nontraumatic intracerebral hemorrhage: Secondary | ICD-10-CM | POA: Diagnosis present

## 2024-03-27 DIAGNOSIS — R2689 Other abnormalities of gait and mobility: Secondary | ICD-10-CM | POA: Diagnosis present

## 2024-03-27 DIAGNOSIS — R2681 Unsteadiness on feet: Secondary | ICD-10-CM | POA: Insufficient documentation

## 2024-03-27 NOTE — Therapy (Signed)
 OUTPATIENT OCCUPATIONAL THERAPY NEURO  Treatment Note  Patient Name: Micheal Ayers MRN: [id number] DOB:11/20/1961, 63 y.o., male Today's Date: 03/27/2024  PCP: Olin Bertin, MD REFERRING PROVIDER: Zelda Hickman, PA-C  END OF SESSION:  OT End of Session - 03/27/24 0943     Visit Number 3    Number of Visits 13    Date for OT Re-Evaluation 05/02/24    Authorization Type [employer] - 60 visits combined (OT/PT/ST)    OT Start Time [phone]    OT Stop Time 1018    OT Time Calculation (min) 42 min               Past Medical History:  Diagnosis Date   ADD (attention deficit disorder)    Allergic rhinitis, cause unspecified    Chronic kidney disease    Acute renal failure   Hemochromatosis, hereditary (HCC)    seen at [facility]   Hypertension    Major depressive disorder    Stroke [facility])    Past Surgical History:  Procedure Laterality Date   CRANIOTOMY Left 02/24/2024   Procedure: CRANIOTOMY HEMATOMA EVACUATION SUBDURAL;  Surgeon: Joaquin Mulberry, MD;  Location: 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 03/11/2024   Subdural hematoma (HCC) 02/24/2024   S/P craniotomy 02/24/2024   Obstructive hydrocephalus (HCC) 03/13/2023   Ischemic cerebrovascular accident (CVA) (HCC) 02/19/2023   Cerebral ventriculomegaly 02/19/2023   Acute renal failure superimposed on stage 2 chronic kidney disease (HCC) 02/19/2023   Hypertensive urgency 02/19/2023   Abnormal CXR 02/19/2023    CVA (cerebral vascular accident) (HCC) 02/19/2023   Fatigue 09/20/2012   Family history of hemochromatosis 09/20/2012   Preventative health care 09/06/2012   ADD (attention deficit disorder)    Allergic rhinitis     ONSET DATE: 02/24/24  REFERRING DIAG: J19.147 (ICD-10-CM) - 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  Pt reports wife would say that pt is not remembering certain details, both from hospital and even now at home.  Pt reports delay or slowing down of routine. 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 He was found to have massive left SDH with signs of septations and small subacute right parafalcine SDH with midline shift. He was evaluated by Dr. Rochelle Chu and underwent left fronto-temporoparietal crani for evacuation of large chronic LDH the same day. He was placed on decadron  and on Keppra  for seizure prophylaxis and serial CT head  showed increaseing post op pneumocephalus with residual L-SDH decreased to 6-8 mm from 12 mm and resolution of medline shift.  PRECAUTIONS: None  WEIGHT BEARING RESTRICTIONS: No  PAIN:  Are you having pain? No  FALLS: Has patient fallen in last 6 months? 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  HAND DOMINANCE: Right  ADLs: Overall ADLs: Mod I - reports more time consuming overall, mild difficulty with getting in/out of shower but no physical assistance Transfers/ambulation related to ADLs: Mod I - using RW 50% of time in the home and when walking in the cul-de-sac Equipment: Grab bars, Walk in shower, and hand held shower head  IADLs: Light housekeeping: is not currently performing and wife did majority even prior Meal Prep: is not currently performing and wife did majority even prior Community mobility: not currently cleared to drive  Medication management: spouse is assisting pt with taking meds appropriately Financial management: spouse is managing bills at this time Handwriting: 100% legible completed PPT #1([place]): 11.66 sec  MOBILITY STATUS:  slower and decreased endurance  POSTURE COMMENTS:  No Significant postural limitations  ACTIVITY TOLERANCE: Activity tolerance: diminished  FUNCTIONAL OUTCOME MEASURES: PSFS: 3.0   UPPER EXTREMITY ROM:  WFL bilaterally  UPPER EXTREMITY MMT:   grossly 4/5 bilaterally  COORDINATION: Finger Nose Finger test: mild dysmetria on R and mildly slower compared to L 9 Hole Peg test: Right: 24.53 sec; Left: 29.06 sec Box and Blocks:  Right 45 blocks, Left 43 blocks - frequently picking up 2 blocks but able to recall to only pick up one at a time RAM: pt with decreased motor control of RUE with rapid hand movements,  demonstrating "tremor-like" movement on R  SENSATION: WFL  COGNITION: Overall cognitive status: No family/caregiver present to determine baseline cognitive functioning Brief Interview for Mental Status:   Memory:  Word Repetition:   "Sock"  "Blue"  "Bed"   Orientation Level:    Year:   2025  Month: April  Day of Week: Thursday  Memory:  Recall: Able to recall Blue and bed, unable to recall sock even when given a cue.      VISION: Subjective report: "I can't see far away" no changes Baseline vision: Wears glasses for distance only   VISION ASSESSMENT: Ocular ROM: WFL Gaze preference/alignment: WDL Tracking/Visual pursuits: Able to track stimulus in all quads without difficulty Saccades: WFL Visual Fields: no apparent deficits  OBSERVATIONS: Pt requiring increased time to answer questions and somewhat tangential when asked questions about pt impairments, possibly demonstrating decreased awareness vs decreased memory.  Pt requiring increased time with 9 hole peg test and Box and blocks, most likely due to overall slowed processing as pt reports "everything is a process".  TREATMENT DATE:  03/27/24 Self-care: pt expressing continued concerns with STM impairments and processing speed.  Pt expressing desire to return to playing poker weekly with some friends, but that his spouse continues to have concerns.  OT educated pt on planned failure completed in therapeutic manner sometimes to increase awareness of deficits.  Also discussed progressive return to functional tasks with decreasing supervision as able such as with self-care tasks and cooking, and possibility of similar process with return to poker.  Further discussing options to allow safe return to leisure tasks, such as ride with a friend, just observe, team play, and/or play without money. Dual tasking: engaged  in cognitive/motor task with standing balance while retrieving and placing resistance clothespins onto vertical dowel.  OT then challenging pt to identify items in category based on colored clothespins.  Pt initially only able to recall 2 of 4 categories, therefore OT writing color/category to increase recall.  Pt independently utilizing written prompt with increased ability to sequence and then name items based on category.  Pt demonstrating increased time when naming items in category - question continued issues with word finding.  Pt demonstrating good balance and activity tolerance, standing for 7 mins while incorporating reaching. Cards: engaged in card game with OT challenging pt to teach therapist [place]  [employer] 'em to facilitate increased sequencing, problem solving, and awareness with leisure task of playing poker.  Pt demonstrating good awareness and sequencing, however therapist with decreased knowledge of game and therefore speed to adequately challenge pt's play speed and awareness.  OT encouraged pt to discuss with spouse to problem solve areas of concern and facilitate a way for pt to safely increase engagement in leisure pursuits.    03/25/24 SLUMS: pt scoring 25/30 with errors in recall of objects and ability to name animals in 1 minute.  Pt recalling 1/5 objects after delay and able to name 10 animals in one minute.  Memory strategies: OT educating pt on memory strategies with association, repeating, and "chunking" into categories - providing examples of each.  OT providing pt with additional suggestions with use of calendar (both written and digital), use of alarms/reminders, and list making. Pill box assessment: pt completed pill box test with 100% accuracy, in 6:39 minutes.  Pt demonstrates good sequencing and multi-tasking throughout task.  Pt demonstrating difficulty with opening variety of pill bottles and dropping 3 pills.  Educated on establishing a system and routine with meds to  increase recall. A Measure of Executive Functioning and Estimate of Medication Management. A straight pass/fail designation is determined by 3 or more errors of omission or misplacement on the task. The pt completed the test with 0 errors, but requiring greater than the 5 min time limit. FMC: provided pt with demonstration and rationale of in-hand manipulation and coordination tasks with focus on improving functional use.  OT providing with handout (see pt instructions) with instruction to complete 1,2,3 and 7.    03/20/24 Educated on role and purpose of OT as well as potential interventions and goals for therapy based on initial evaluation findings.  Educating on attempts at engagement in household tasks as able to increase both endurance as well as cognitive engagement/processing.     PATIENT EDUCATION: Education details: ongoing condition specific education, see above for education on memory strategies and medication management Person educated: Patient Education method: Explanation and Handouts Education comprehension: needs further education  HOME EXERCISE PROGRAM: 03/25/24 - memory strategies and coordination (see pt instructions)   GOALS: Goals reviewed with patient? Yes  SHORT TERM GOALS: Target date: 04/11/24  Pt will be independent with fine and gross motor control HEP with use of visual handouts. Baseline: slower overall with fine and gross motor tasks Goal status: in progress  2.  Pt will verbalize understanding of task modifications and/or potential AE needs to increase ease, safety, and independence w/ ADLs and IADLs. Baseline: limited engagement in IADLs, slower overall with ADLs Goal status: INITIAL  3.  Pt will demonstrate improved memory, sequencing, and problem solving as demonstrated by improvements in score on pill box assessment by ability to complete in 5 mins time limit with </= to 2 errors.  Baseline: wife managing meds/ TBD 03/25/24 - no errors, but required  6:39 to complete Goal status: in progress  4.  Pt will verbalize understanding of energy conservation strategies.  Baseline: decreased endurance  Goal status: in progress   LONG TERM GOALS: Target date: 05/02/24  Pt will demonstrate understanding of memory compensations and ways to keep thinking skills sharp Baseline:  Goal status:  in progress  2.  Pt will perform dynamic standing task for 10 mins as needed to complete simple IADLs w/o LOB using DME and/or countertop support prn. Baseline: decreased engagement in IADLs Goal status:  in progress  3.  Pt will demonstrate ability to sequence simple functional task (simple snack prep, laundry task, etc) at Mod I level with good safety awareness and ability to utilize memory strategies for recall.  Baseline: decreased engagement in IADLs Goal status:  in progress  4.  Pt will navigate a moderately busy environment, completing dual task activity and/or following multi-step commands with 90% accuracy Baseline: impaired memory and thinking strategies and endurance impacting return to work Goal status:  in progress  5.  Pt will be independent with utilization of memory strategies to allow for increased independence with managing personal appointments and in preparation for return to work. Baseline: decreased memory and thinking strategies Goal status:  in progress  6.  Patient will report at least two-point increase in average PSFS score or at least three-point increase in a single activity score indicating functionally significant improvement given minimum detectable change. Baseline: 3.0 Goal status:  in progress  ASSESSMENT:  CLINICAL IMPRESSION: Pt continues to require increased time for sequencing and initiation during cognitive/motor dual tasking activity.  Pt requiring written categories for increased recall and increased time when naming items.  OT discussed with pt his ongoing concerns with his memory and word finding, pt may  benefit from SLP evaluation and treat as pt feels that he is still not at premorbid level cognitively.  Pt will continue to benefit from focus on memory, especially short term memory and establishing a process or routine to increase recall.  Pt will continue benefit from skilled occupational therapy services to address coordination, pain management, balance, GM/FM control, cognition, safety awareness, introduction of compensatory strategies/AE prn, visual-perception, and implementation of an HEP to improve participation and safety during ADLs and IADLs.     PERFORMANCE DEFICITS: in functional skills including ADLs, IADLs, coordination, pain, Fine motor control, Gross motor control, balance, body mechanics, endurance, decreased knowledge of precautions, and UE functional use, cognitive skills including emotional, energy/drive, memory, safety awareness, and thought, and psychosocial skills including coping strategies and routines and behaviors.     PLAN:  OT FREQUENCY: 2x/week  OT DURATION: 6 weeks  PLANNED INTERVENTIONS: 97168 OT Re-evaluation, 97535 self care/ADL training, 40981 therapeutic exercise, 97530 therapeutic activity, 97112 neuromuscular re-education, functional mobility training, visual/perceptual remediation/compensation, psychosocial skills  training, energy conservation, coping strategies training, patient/family education, and DME and/or AE instructions  RECOMMENDED OTHER SERVICES: may benefit from SLP, TBD after additional session and assessment of SLUMS  CONSULTED AND AGREED WITH PLAN OF CARE: Patient  PLAN FOR NEXT SESSION: provide education on memory compensation strategies and keeping thinking skills sharp  Start with coordination HEP and work on [employer] activities and recall strategies.  Novel card game for memory and sequencing.   Anthonette Kinsman, OTR/L 03/27/2024, 11:51 AM   [facility] at [facility] [address] [place], [address] [place],  [place] [address] Phone # [phone] Fax # [phone]

## 2024-03-27 NOTE — Telephone Encounter (Signed)
 Dr. Dorn Gaskins, Mr. Micheal Ayers was evaluated by OT and PT on 03/20/24 and has been seen twice this week fo treatment and he continues to report decreased STM and requiring increased time for sequencing and word finding.  Per chart review, did receive SLP in inpatient rehab, however due to ongoing pt concerns, pt would benefit from Speech therapy evaluation due to above concerns.    If you agree, please place an order in [facility] workque in [facility] or fax the order to [phone]. Thank you, Anthonette Kinsman, OT   [facility], [facility] [address] [place], [place]  [address] Phone:  [phone] Fax:  [phone]

## 2024-03-28 ENCOUNTER — Ambulatory Visit

## 2024-03-28 DIAGNOSIS — I69118 Other symptoms and signs involving cognitive functions following nontraumatic intracerebral hemorrhage: Secondary | ICD-10-CM

## 2024-03-28 DIAGNOSIS — R2681 Unsteadiness on feet: Secondary | ICD-10-CM

## 2024-03-28 DIAGNOSIS — R262 Difficulty in walking, not elsewhere classified: Secondary | ICD-10-CM

## 2024-03-28 DIAGNOSIS — R278 Other lack of coordination: Secondary | ICD-10-CM | POA: Diagnosis not present

## 2024-03-28 DIAGNOSIS — M6281 Muscle weakness (generalized): Secondary | ICD-10-CM

## 2024-03-28 DIAGNOSIS — R2689 Other abnormalities of gait and mobility: Secondary | ICD-10-CM

## 2024-03-28 NOTE — Telephone Encounter (Signed)
Referral sent. Thank you!

## 2024-03-28 NOTE — Therapy (Signed)
 OUTPATIENT PHYSICAL THERAPY NEURO TREATMENT   Patient Name: Micheal Ayers MRN: [id number] DOB:11-24-1961, 63 y.o., male Today's Date: 03/28/2024   PCP: Olin Bertin, MD REFERRING PROVIDER: Zelda Hickman, PA-C  END OF SESSION:  PT End of Session - 03/28/24 0759     Visit Number 3    Number of Visits 13    Date for PT Re-Evaluation 05/01/24    Authorization Type [employer]    Authorization Time Period 60 visit limit combined    Authorization - Visit Number 2    Authorization - Number of Visits 30    PT Start Time 0800    PT Stop Time 0845    PT Time Calculation (min) 45 min             Past Medical History:  Diagnosis Date   ADD (attention deficit disorder)    Allergic rhinitis, cause unspecified    Chronic kidney disease    Acute renal failure   Hemochromatosis, hereditary (HCC)    seen at [facility]   Hypertension    Major depressive disorder    Stroke [facility])    Past Surgical History:  Procedure Laterality Date   CRANIOTOMY Left 02/24/2024   Procedure: CRANIOTOMY HEMATOMA EVACUATION SUBDURAL;  Surgeon: Joaquin Mulberry, MD;  Location: [facility] OR;  Service: Neurosurgery;  Laterality: Left;   elbow surgury  1969   left elbow   INGUINAL HERNIA REPAIR  2003   left   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/2023   Procedure: Shunt Placment - right occipital;  Surgeon: Agustina Aldrich, MD;  Location: [facility] OR;  Service: Neurosurgery;  Laterality: Right;   Patient Active Problem List   Diagnosis Date Noted   Hyponatremia 03/17/2024   GERD (gastroesophageal reflux disease) 03/17/2024   Anxiety disorder with panic attacks 03/17/2024   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 03/11/2024   Subdural hematoma (HCC) 02/24/2024   S/P craniotomy 02/24/2024   Obstructive hydrocephalus (HCC) 03/13/2023   Ischemic cerebrovascular accident (CVA) (HCC) 02/19/2023   Cerebral ventriculomegaly 02/19/2023   Acute renal failure superimposed on stage 2 chronic kidney  disease (HCC) 02/19/2023   Hypertensive urgency 02/19/2023   Abnormal CXR 02/19/2023   CVA (cerebral vascular accident) (HCC) 02/19/2023   Fatigue 09/20/2012   Family history of hemochromatosis 09/20/2012   Preventative health care 09/06/2012   ADD (attention deficit disorder)    Allergic rhinitis     ONSET DATE: 02/19/24  REFERRING DIAG:  B14.782 (ICD-10-CM) - 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 male with history of CVA 01/2023, obstructive hydrocephalus status post VP shunt 02/2023, chronic/GAD, panic attacks, low back to 02/24/2024 with 2-day history of right facial droop and difficulty DH with signs of septations and small subacute right parafalcine SDH with midline shift.  He underwent left for evacuation of bleed by Dr. Rochelle Chu.  Postop placed on Decadron  as well as Keppra  for seizure prophylaxis.  Follow-up CT showed resolution of midline shift Decadron  was discharged.  Therapy was working with patient who was noted to have balance deficits with decreasing coordination on right, difficulty weightbearing through RLE as well as tendency to drift when walking.  He was independent working prior to admission.  CIR was recommended due to functional decline.  PAIN:  Are you having pain? Yes: NPRS scale: 2/10 Pain location: behind eyes Pain  description: pressure Aggravating factors:   Relieving factors:    PRECAUTIONS: Other:   Special Instructions: No driving, strenuous activity or return to work till cleared by MD. Recommend repeat check BMET in 1-2 weeks to monitor sodium levels  RED FLAGS: None   WEIGHT BEARING RESTRICTIONS: No  FALLS: Has patient fallen in last 6 months? No  LIVING ENVIRONMENT: Lives with: lives with their family and lives with their spouse Lives in: House/apartment Stairs: Yes: Internal: flight steps; can reach both Has following equipment at home: Otho Blitz - 2 wheeled Master on main floor. PLOF: Independent,   PATIENT GOALS: Improve balance, walking, and mobility  OBJECTIVE:   TODAY'S TREATMENT: 03/28/24 Activity Comments  Stair ambulation Modified independent w/ single HR, ascending w/ right, descending w/ left  Ambulation outside w/ trekking pole SBA uneven surfaces, combined with dual-tasking  balance -single leg lift-off BOSU 2x10 -tandem stance x 30 sec -heel raise w/ hold 20x -feet together eyes closed x 30 sec. Head turns EC x 30 sec -feet apart on foam: EO x 60, EC x 30, EO head turns x 30" -retrowalk 2x45 ft -sidestep 2x45 ft              TODAY'S TREATMENT: 03/24/24 Activity Comments  Vitals: 124/88 mmHg, 96 bpm   Gait training Wide based SPC Trekking pole  Corner balance -feet together EO/EC x 30 sec. Head turns 3x EO/EC -semi-tandem x 30 sec  Sit to stand stride stance RLE 2x10   4-square step w/ trekking pole -Tape lines -canes -canes + airex pad and 6" hurdle  Kicking physioball x 2 min For weight shift/SLS  NU-step intervals x 8 min 2 min warm-up 30 sec sprint; 60 sec recovery HR    PATIENT EDUCATION: Education details: assessment details, rationale of PT intervention, discussion of benefit of gradual increase of cardiovascular activities for general health and improved activity tolerance/function Person educated: Patient Education method:  Explanation Education comprehension: verbalized understanding  HOME EXERCISE PROGRAM: Access Code: [email or web address] URL: [email or web address] Date: 03/24/2024 Prepared by: Kelly Tannah Dreyfuss  Exercises - Corner Balance Feet Together With Eyes Open  - 1 x daily - 7 x weekly - 3 sets - 30 sec hold - Corner Balance Feet Together With Eyes Closed  - 1 x daily - 7 x weekly - 3 sets - 30 sec hold - Corner Balance Feet Together: Eyes Open With Head Turns  - 1 x daily - 7 x weekly - 3 sets - 3 reps - Corner Balance Feet Together: Eyes Closed With Head Turns  - 1 x daily - 7 x weekly -  3 sets - 3 reps - Semi-Tandem Corner Balance With Eyes Open  - 1 x daily - 7 x weekly - 3 sets - 30 sec hold - Sit to Stand in Stride with AFO and UE Assist in LE Alignment  - 1 x daily - 7 x weekly - 3 sets - 10 reps - Standing Foot Lift on Box (BKA)  - 1 x daily - 7 x weekly - 2-3 sets - 10 reps  Note: Objective measures were completed at Evaluation unless otherwise noted.  DIAGNOSTIC FINDINGS:  IMPRESSION: 1. Increasing postoperative Pneumocephalus, now affecting both anterior frontal lobes (see series 4, image 17). 2. Subdural drain removed with interval increased residual Left Subdural Hematoma; 12 mm maximal thickness now vs 6-8 mm on 02/25/2022. Small volume para falcine SDH unchanged. 3. No significant midline shift. Stable basilar cisterns. Stable CSF shunt and ventriculomegaly.  COGNITION: Overall cognitive status: History of cognitive impairments - at baseline per pt report since onset of hydrocephalus having increased difficulty with conversation and recall   SENSATION: WFL  COORDINATION: Difficulty with rapid alternating movements on right Finger to nose WNL Heel to shin WNL  EDEMA:  none  MUSCLE TONE: WNL    POSTURE: No Significant postural limitations  LOWER EXTREMITY ROM:     Active  Right Eval Left Eval  Hip flexion    Hip extension    Hip abduction    Hip adduction     Hip internal rotation    Hip external rotation    Knee flexion    Knee extension    Ankle dorsiflexion 8 15  Ankle plantarflexion    Ankle inversion    Ankle eversion     (Blank rows = not tested)  LOWER EXTREMITY MMT:    MMT Right Eval Left Eval  Hip flexion 4 5  Hip extension    Hip abduction 3 5  Hip adduction 4 5  Hip internal rotation    Hip external rotation    Knee flexion 4 5  Knee extension 4 5  Ankle dorsiflexion 3+ 5  Ankle plantarflexion    Ankle inversion    Ankle eversion    (Blank rows = not tested)  BED MOBILITY:  Not tested  TRANSFERS: Independent Floor to stand: NT    CURB:  Findings: CGA  STAIRS: Findings: Level of Assistance: SBA, Number of Stairs: 6, Height of Stairs: 4-6"   , and Comments: BHR GAIT: Findings: Distance walked: 150, Level of assistance: Modified independence, and Comments: right lateral trunk lean in stance phase  FUNCTIONAL TESTS:  5 times sit to stand: 18.60 sec Timed up and go (TUG): 11.62 sec; TUG cog: 20.75 sec Berg Balance Scale: 43/56 10 meter walk test: 12.22 sec = 2.68 ft/sec  M-CTSIB  Condition 1: Firm Surface, EO 30 Sec, Mild Sway  Condition 2: Firm Surface, EC 30 Sec, Mild and Moderate Sway  Condition 3: Foam Surface, EO 30 Sec, Mild and Moderate Sway  Condition 4: Foam Surface, EC 20 Sec, Moderate and Severe Sway  TREATMENT DATE: 03/20/24      GOALS: Goals reviewed with patient? Yes  SHORT TERM GOALS: Target date: 04/10/2024    Patient will be independent in HEP to improve functional outcomes Baseline: Goal status: INITIAL  2.  Demo improved BLE strength and reduced risk for falls per time 15 sec 5xSTS test Baseline: 18 sec Goal status: INITIAL  3.  Improve safety and access to home environment navigating flight of stairs modified independence Baseline:  SBA Goal status: INITIAL  4.  Improve proprioceptive awareness and postural stability for improved safety in ADL by mild sway condition 4 M-CTSIB Baseline: mod-severe x 20 sec Goal status: INITIAL    LONG TERM GOALS: Target date: 05/01/2024    Demo 10% or less difference TUG test with cognitive dual-task component to improve safety with mobility Baseline: 78% (TUG reg 11.62; TUG cog 20.75 sec) Goal status: INITIAL  2.  Demo low risk for falls per score 52/56 Berg Balance Test Baseline: 43/56 Goal status: INITIAL  3.  Increase gait speed to 3.0 ft/sec to improve efficiency of community ambulation Baseline: 2.68 ft/sec Goal status: INITIAL  4.  Demo independent ambulation community level distances over various surfaces to improve safety in environment Baseline: modified indep level, supervision-SBA uneven Goal status: INITIAL  5.  Independent floor to stand transfers for improved functional mobility Baseline: NT Goal status: INITIAL    ASSESSMENT:  CLINICAL IMPRESSION: Reports family is apprehensive about him navigating stairs at home.  Practice with therapy stair case and using right HR to ascend/left to descend to mimic home with good safety/stability--modified indep.  Ambulation outside around campus w/ trekking pole and SBA with cues in sequence and occasional CGA when navigating uneven surface/curb cuts espeically when engaged in dual tasking which caused disruption to gait mechanics.  Static and dynamic balance activities to facilitate righting reactions and reactive balance strategies and proprioceptive awareness.  Unable to maintain feet together on compliant surfaces w/ improved perofrmance wide BOS for achieving equal weight.  Dynamic balance to improve single limb support for safety in stairs and negotiating obstacles. Continued sesions to advance POC details to improve mobility and reduce risk for falls.   OBJECTIVE IMPAIRMENTS: Abnormal gait, decreased activity  tolerance, decreased balance, decreased coordination, decreased endurance, decreased mobility, difficulty walking, decreased ROM, and decreased strength.   ACTIVITY LIMITATIONS: carrying, lifting, bending, squatting, stairs, reach over head, and locomotion level  PARTICIPATION LIMITATIONS: meal prep, cleaning, laundry, interpersonal relationship, driving, shopping, community activity, occupation, and yard work  PERSONAL FACTORS: Age, Time since onset of injury/illness/exacerbation, and 1-2 comorbidities: hx of hydrocephalus, HTN  are also affecting patient's functional outcome.   REHAB POTENTIAL: Excellent  CLINICAL DECISION MAKING: Evolving/moderate complexity  EVALUATION COMPLEXITY: Moderate  PLAN:  PT FREQUENCY: 2x/week  PT DURATION: 6 weeks  PLANNED INTERVENTIONS: 97750- Physical Performance Testing, 97110-Therapeutic exercises, 97530- Therapeutic activity, [id number]- Neuromuscular re-education, 97535- Self Care, 62130- Manual therapy, [id number]- Gait training, [id number]- Orthotic Initial, [id number]- Orthotic/Prosthetic subsequent, [phone]- Aquatic Therapy, and DME instructions  PLAN FOR NEXT SESSION: HEP review   8:00 AM, 03/28/24 M. Kelly Taegen Lennox, PT, DPT Physical Therapist- [place] Office Number: [phone]

## 2024-04-01 NOTE — Therapy (Signed)
 OUTPATIENT PHYSICAL THERAPY NEURO TREATMENT   Patient Name: Micheal Ayers MRN: [id number] DOB:1961/06/13, 63 y.o., male Today's Date: 04/02/2024   PCP: Olin Bertin, MD REFERRING PROVIDER: Zelda Hickman, PA-C  END OF SESSION:  PT End of Session - 04/02/24 1059     Visit Number 4    Number of Visits 13    Date for PT Re-Evaluation 05/01/24    Authorization Type [employer]    Authorization Time Period 60 visit limit combined    Authorization - Visit Number 3    Authorization - Number of Visits 30    PT Start Time 1017    PT Stop Time 1058    PT Time Calculation (min) 41 min    Equipment Utilized During Treatment Gait belt    Activity Tolerance Patient tolerated treatment well    Behavior During Therapy WFL for tasks assessed/performed              Past Medical History:  Diagnosis Date   ADD (attention deficit disorder)    Allergic rhinitis, cause unspecified    Chronic kidney disease    Acute renal failure   Hemochromatosis, hereditary (HCC)    seen at [facility]   Hypertension    Major depressive disorder    Stroke [facility])    Past Surgical History:  Procedure Laterality Date   CRANIOTOMY Left 02/24/2024   Procedure: CRANIOTOMY HEMATOMA EVACUATION SUBDURAL;  Surgeon: Joaquin Mulberry, MD;  Location: [facility] OR;  Service: Neurosurgery;  Laterality: Left;   elbow surgury  1969   left elbow   INGUINAL HERNIA REPAIR  2003   left   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/2023   Procedure: Shunt Placment - right occipital;  Surgeon: Agustina Aldrich, MD;  Location: [facility] OR;  Service: Neurosurgery;  Laterality: Right;   Patient Active Problem List   Diagnosis Date Noted   Hyponatremia 03/17/2024   GERD (gastroesophageal reflux disease) 03/17/2024   Anxiety disorder with panic attacks 03/17/2024   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 03/11/2024   Subdural hematoma (HCC) 02/24/2024   S/P craniotomy 02/24/2024   Obstructive hydrocephalus (HCC)  03/13/2023   Ischemic cerebrovascular accident (CVA) (HCC) 02/19/2023   Cerebral ventriculomegaly 02/19/2023   Acute renal failure superimposed on stage 2 chronic kidney disease (HCC) 02/19/2023   Hypertensive urgency 02/19/2023   Abnormal CXR 02/19/2023   CVA (cerebral vascular accident) (HCC) 02/19/2023   Fatigue 09/20/2012   Family history of hemochromatosis 09/20/2012   Preventative health care 09/06/2012   ADD (attention deficit disorder)    Allergic rhinitis     ONSET DATE: 02/19/24  REFERRING DIAG:  W29.562 (ICD-10-CM) - 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 Don't check BP at home.   Pt accompanied by: self  PERTINENT HISTORY: 63 y.o. male with history of CVA 01/2023, obstructive hydrocephalus status post VP shunt 02/2023, chronic/GAD, panic attacks, low back to 02/24/2024 with 2-day history of right facial droop and difficulty DH with signs of septations and small subacute right parafalcine SDH with midline shift.  He underwent left for evacuation of bleed by Dr. Rochelle Chu.  Postop placed on Decadron  as well as Keppra  for seizure prophylaxis.  Follow-up CT showed resolution of midline shift Decadron  was discharged.  Therapy was working with patient who was noted to have balance deficits with decreasing coordination on right,  difficulty weightbearing through RLE as well as tendency to drift when walking.  He was independent working prior to admission.  CIR was recommended due to functional decline.  PAIN:  Are you having pain? Yes: NPRS scale: 0/10 Pain location: behind eyes Pain description: pressure Aggravating factors:   Relieving factors:    PRECAUTIONS: Other:   Special Instructions: No driving, strenuous activity or return to work till cleared by MD. Recommend repeat check BMET in 1-2 weeks to monitor sodium levels  RED FLAGS: None   WEIGHT BEARING RESTRICTIONS: No  FALLS: Has patient fallen in last 6 months? No  LIVING ENVIRONMENT: Lives with: lives with their family and lives with their spouse Lives in: House/apartment Stairs: Yes: Internal: flight steps; can reach both Has following equipment at home: Otho Blitz - 2 wheeled Master on main floor. PLOF: Independent,   PATIENT GOALS: Improve balance, walking, and mobility  OBJECTIVE:     TODAY'S TREATMENT: 04/02/24 Activity Comments  Vitals at start of session  112/81mmHg, 99 bpm, 95%  review of HEP: Romberg 30" romberg EC 30" romberg + head turns 30" semi tandem 30" STS staggered (R foot back) 10x standing foot lift on box Mild sway EC and romberg head. Weaned UE support with lift offs- more challenging with R LE stabilizing   gait training with trekking pole in L hand ~11ft Cueing for safe placement of pole and proper sequence; CGA  in II bars: R step ups 8" 10x  side steps over hurdles  Cues to "march" knees up to step over             HOME EXERCISE PROGRAM Last updated: 04/02/24 Access Code: [email or web address] URL: [email or web address] Date: 04/02/2024 Prepared by: [facility] - Outpatient  Rehab - [facility]  Exercises - Corner Balance Feet Together With Eyes Closed  - 1 x daily - 7 x weekly - 3 sets - 30 sec hold - Corner Balance Feet Together: Eyes Open With Head Turns  - 1 x daily - 7 x weekly - 3 sets - 30 sec  hold - Corner Balance Feet Together: Eyes Closed With Head Turns  - 1 x daily - 7 x weekly - 3 sets - 30 sec hold - Sit to Stand in Stride with AFO and UE Assist in LE Alignment  - 1 x daily - 7 x weekly - 3 sets - 10 reps - Standing Foot Lift on Box (BKA)  - 1 x daily - 7 x weekly - 2-3 sets - 10 reps  PATIENT EDUCATION: Education details: encouraged increased water intake to see if it resolves lightheaded sensation; HEP review and consolidation; encouraged him to speak to OT about his memory concerns Person educated: Patient Education method: Explanation, Demonstration, Tactile cues,  Verbal cues, and Handouts Education comprehension: verbalized understanding and returned demonstration    Note: Objective measures were completed at Evaluation unless otherwise noted.  DIAGNOSTIC FINDINGS:  IMPRESSION: 1. Increasing postoperative Pneumocephalus, now affecting both anterior frontal lobes (see series 4, image 17). 2. Subdural drain removed with interval increased residual Left Subdural Hematoma; 12 mm maximal thickness now vs 6-8 mm on 02/25/2022. Small volume para falcine SDH unchanged. 3. No significant midline shift. Stable basilar cisterns. Stable CSF shunt and ventriculomegaly.  COGNITION: Overall cognitive status: History of cognitive impairments - at baseline per pt report since onset of hydrocephalus having increased difficulty with conversation and recall   SENSATION: WFL  COORDINATION: Difficulty with rapid alternating movements on right Finger to nose WNL Heel to shin WNL  EDEMA:  none  MUSCLE TONE: WNL    POSTURE: No Significant postural limitations  LOWER EXTREMITY ROM:     Active  Right Eval Left Eval  Hip flexion    Hip extension    Hip abduction    Hip adduction    Hip internal rotation    Hip external rotation    Knee flexion    Knee extension    Ankle dorsiflexion 8 15  Ankle plantarflexion    Ankle inversion    Ankle eversion     (Blank  rows = not tested)  LOWER EXTREMITY MMT:    MMT Right Eval Left Eval  Hip flexion 4 5  Hip extension    Hip abduction 3 5  Hip adduction 4 5  Hip internal rotation    Hip external rotation    Knee flexion 4 5  Knee extension 4 5  Ankle dorsiflexion 3+ 5  Ankle plantarflexion    Ankle inversion    Ankle eversion    (Blank rows = not tested)  BED MOBILITY:  Not tested  TRANSFERS: Independent Floor to stand: NT    CURB:  Findings: CGA  STAIRS: Findings: Level of Assistance: SBA, Number of Stairs: 6, Height of Stairs: 4-6"   , and Comments: BHR GAIT: Findings: Distance walked: 150, Level of assistance: Modified independence, and Comments: right lateral trunk lean in stance phase  FUNCTIONAL TESTS:  5 times sit to stand: 18.60 sec Timed up and go (TUG): 11.62 sec; TUG cog: 20.75 sec Berg Balance Scale: 43/56 10 meter walk test: 12.22 sec = 2.68 ft/sec  M-CTSIB  Condition 1: Firm Surface, EO 30 Sec, Mild Sway  Condition 2: Firm Surface, EC 30 Sec, Mild and Moderate Sway  Condition 3: Foam Surface, EO 30 Sec, Mild and Moderate Sway  Condition 4: Foam Surface, EC 20 Sec, Moderate and Severe Sway                                                                                                                                  TREATMENT DATE: 03/20/24      GOALS: Goals reviewed with patient? Yes  SHORT TERM  GOALS: Target date: 04/10/2024    Patient will be independent in HEP to improve functional outcomes Baseline: Goal status: INITIAL  2.  Demo improved BLE strength and reduced risk for falls per time 15 sec 5xSTS test Baseline: 18 sec Goal status: INITIAL  3.  Improve safety and access to home environment navigating flight of stairs modified independence Baseline: SBA Goal status: INITIAL  4.  Improve proprioceptive awareness and postural stability for improved safety in ADL by mild sway condition 4 M-CTSIB Baseline: mod-severe x 20 sec Goal status:  INITIAL    LONG TERM GOALS: Target date: 05/01/2024    Demo 10% or less difference TUG test with cognitive dual-task component to improve safety with mobility Baseline: 78% (TUG reg 11.62; TUG cog 20.75 sec) Goal status: INITIAL  2.  Demo low risk for falls per score 52/56 Berg Balance Test Baseline: 43/56 Goal status: INITIAL  3.  Increase gait speed to 3.0 ft/sec to improve efficiency of community ambulation Baseline: 2.68 ft/sec Goal status: INITIAL  4.  Demo independent ambulation community level distances over various surfaces to improve safety in environment Baseline: modified indep level, supervision-SBA uneven Goal status: INITIAL  5.  Independent floor to stand transfers for improved functional mobility Baseline: NT Goal status: INITIAL    ASSESSMENT:  CLINICAL IMPRESSION: Patient arrived to session with report of some lightheadedness this AM. Vitals at start of session was [facility], however since HR was on the higher end of normal encouraged increased hydration to see if sx resolve. Reviewed HEP for max carryover and benefit- provided cueing and clarification. Reviewed use of trekking pole for proper sequencing- pt will need more practice on this. Pt tolerated session well and without complaints upon leaving.   OBJECTIVE IMPAIRMENTS: Abnormal gait, decreased activity tolerance, decreased balance, decreased coordination, decreased endurance, decreased mobility, difficulty walking, decreased ROM, and decreased strength.   ACTIVITY LIMITATIONS: carrying, lifting, bending, squatting, stairs, reach over head, and locomotion level  PARTICIPATION LIMITATIONS: meal prep, cleaning, laundry, interpersonal relationship, driving, shopping, community activity, occupation, and yard work  PERSONAL FACTORS: Age, Time since onset of injury/illness/exacerbation, and 1-2 comorbidities: hx of hydrocephalus, HTN  are also affecting patient's functional outcome.   REHAB POTENTIAL:  Excellent  CLINICAL DECISION MAKING: Evolving/moderate complexity  EVALUATION COMPLEXITY: Moderate  PLAN:  PT FREQUENCY: 2x/week  PT DURATION: 6 weeks  PLANNED INTERVENTIONS: 97750- Physical Performance Testing, 97110-Therapeutic exercises, 97530- Therapeutic activity, 97112- Neuromuscular re-education, 97535- Self Care, 16109- Manual therapy, [phone]- Gait training, [phone]- Orthotic Initial, [phone]- Orthotic/Prosthetic subsequent, [phone]- Aquatic Therapy, and DME instructions  PLAN FOR NEXT SESSION: trekking pole gait, foot clearance, balance    Thaddeus Filippo, PT, DPT 04/02/24 11:00 AM  [facility] [address], [address] [place], [place] [address] Phone # [phone] Fax # [phone]

## 2024-04-02 ENCOUNTER — Ambulatory Visit: Admitting: Physical Therapy

## 2024-04-02 ENCOUNTER — Encounter: Payer: Self-pay | Admitting: Physical Therapy

## 2024-04-02 ENCOUNTER — Ambulatory Visit: Admitting: Occupational Therapy

## 2024-04-02 DIAGNOSIS — I69118 Other symptoms and signs involving cognitive functions following nontraumatic intracerebral hemorrhage: Secondary | ICD-10-CM

## 2024-04-02 DIAGNOSIS — M6281 Muscle weakness (generalized): Secondary | ICD-10-CM

## 2024-04-02 DIAGNOSIS — R2681 Unsteadiness on feet: Secondary | ICD-10-CM

## 2024-04-02 DIAGNOSIS — I69119 Unspecified symptoms and signs involving cognitive functions following nontraumatic intracerebral hemorrhage: Secondary | ICD-10-CM

## 2024-04-02 DIAGNOSIS — R278 Other lack of coordination: Secondary | ICD-10-CM | POA: Diagnosis not present

## 2024-04-02 DIAGNOSIS — R262 Difficulty in walking, not elsewhere classified: Secondary | ICD-10-CM

## 2024-04-02 DIAGNOSIS — R2689 Other abnormalities of gait and mobility: Secondary | ICD-10-CM

## 2024-04-02 NOTE — Therapy (Signed)
 OUTPATIENT OCCUPATIONAL THERAPY NEURO  Treatment Note  Patient Name: Micheal Ayers MRN: [id number] DOB:[birth year], 63 y.o., male Today's Date: 04/02/2024  PCP: Olin Bertin, MD REFERRING PROVIDER: Zelda Hickman, PA-C  END OF SESSION:  OT End of Session - 04/02/24 1114     Visit Number 4    Number of Visits 13    Date for OT Re-Evaluation 05/02/24    Authorization Type [employer] - 60 visits combined (OT/PT/ST)    OT Start Time 1108    OT Stop Time 1148    OT Time Calculation (min) 40 min                Past Medical History:  Diagnosis Date   ADD (attention deficit disorder)    Allergic rhinitis, cause unspecified    Chronic kidney disease    Acute renal failure   Hemochromatosis, hereditary (HCC)    seen at [facility]   Hypertension    Major depressive disorder    Stroke [facility])    Past Surgical History:  Procedure Laterality Date   CRANIOTOMY Left 02/24/2024   Procedure: CRANIOTOMY HEMATOMA EVACUATION SUBDURAL;  Surgeon: Joaquin Mulberry, MD;  Location: [facility] OR;  Service: Neurosurgery;  Laterality: Left;   elbow surgury  1969   left elbow   INGUINAL HERNIA REPAIR  2003   left   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/2023   Procedure: Shunt Placment - right occipital;  Surgeon: Agustina Aldrich, MD;  Location: [facility] OR;  Service: Neurosurgery;  Laterality: Right;   Patient Active Problem List   Diagnosis Date Noted   Hyponatremia 03/17/2024   GERD (gastroesophageal reflux disease) 03/17/2024   Anxiety disorder with panic attacks 03/17/2024   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 03/11/2024   Subdural hematoma (HCC) 02/24/2024   S/P craniotomy 02/24/2024   Obstructive hydrocephalus (HCC) 03/13/2023   Ischemic cerebrovascular accident (CVA) (HCC) 02/19/2023   Cerebral ventriculomegaly 02/19/2023   Acute renal failure superimposed on stage 2 chronic kidney disease (HCC) 02/19/2023   Hypertensive urgency 02/19/2023   Abnormal CXR 02/19/2023    CVA (cerebral vascular accident) (HCC) 02/19/2023   Fatigue 09/20/2012   Family history of hemochromatosis 09/20/2012   Preventative health care 09/06/2012   ADD (attention deficit disorder)    Allergic rhinitis     ONSET DATE: 02/24/24  REFERRING DIAG: W09.811 (ICD-10-CM) - 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  He reports that he had not had that happen since he was in the hospital.    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 He was found to have massive left SDH with signs of septations and small subacute right parafalcine SDH with midline shift. He was evaluated by Dr. Rochelle Chu and underwent left fronto-temporoparietal crani for evacuation of large chronic LDH the same day. He was placed on decadron  and on Keppra  for seizure prophylaxis and serial CT head showed increaseing post op pneumocephalus with residual L-SDH decreased  to 6-8 mm from 12 mm and resolution of medline shift.  PRECAUTIONS: None  WEIGHT BEARING RESTRICTIONS: No  PAIN:  Are you having pain? No  FALLS: Has patient fallen in last 6 months? 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  HAND DOMINANCE: Right  ADLs: Overall ADLs: Mod I - reports more time consuming overall, mild difficulty with getting in/out of shower but no physical assistance Transfers/ambulation related to ADLs: Mod I - using RW 50% of time in the home and when walking in the cul-de-sac Equipment: Grab bars, Walk in shower, and hand held shower head  IADLs: Light housekeeping: is not currently performing and wife did majority even prior Meal Prep: is not currently performing and wife did majority even prior Community mobility: not currently cleared to drive  Medication management: spouse is assisting pt with taking meds appropriately Financial management: spouse is managing bills at this time Handwriting: 100% legible completed PPT #1([place]): 11.66 sec  MOBILITY STATUS:  slower and decreased endurance  POSTURE COMMENTS:  No Significant postural limitations  ACTIVITY TOLERANCE: Activity tolerance: diminished  FUNCTIONAL OUTCOME MEASURES: PSFS: 3.0   UPPER EXTREMITY ROM:  WFL bilaterally  UPPER EXTREMITY MMT:   grossly 4/5 bilaterally  COORDINATION: Finger Nose Finger test: mild dysmetria on R and mildly slower compared to L 9 Hole Peg test: Right: 24.53 sec; Left: 29.06 sec Box and Blocks:  Right 45 blocks, Left 43 blocks - frequently picking up 2 blocks but able to recall to only pick up one at a time RAM: pt with decreased motor control of RUE with rapid hand movements, demonstrating "tremor-like" movement on  R  SENSATION: WFL  COGNITION: Overall cognitive status: No family/caregiver present to determine baseline cognitive functioning Brief Interview for Mental Status:   Memory:  Word Repetition:   "Sock"  "Blue"  "Bed"   Orientation Level:    Year:   2025  Month: April  Day of Week: Thursday  Memory:  Recall: Able to recall Blue and bed, unable to recall sock even when given a cue.      VISION: Subjective report: "I can't see far away" no changes Baseline vision: Wears glasses for distance only   VISION ASSESSMENT: Ocular ROM: WFL Gaze preference/alignment: WDL Tracking/Visual pursuits: Able to track stimulus in all quads without difficulty Saccades: WFL Visual Fields: no apparent deficits  OBSERVATIONS: Pt requiring increased time to answer questions and somewhat tangential when asked questions about pt impairments, possibly demonstrating decreased awareness vs decreased memory.  Pt requiring increased time with 9 hole peg test and Box and blocks, most likely due to overall slowed processing as pt reports "everything is a process".  TREATMENT DATE:  04/02/24 Multi-tasking: OT instructed pt in novel card game "Speed" - to improve cognition, sequencing, and memory during multi-step tasks and educating on functional carryover of multi-tasking. Pt demonstrating good sequencing with action of task, however frequently drawing from incorrect pile.  OT providing education on placement of cards and carryover to functional tasks to facilitate increased recall - e.g. omitting distractions or clutter and placing items in same place(s) to facilitate increased recall. Planning/organizing: engaged in simple planning/problem solving prompt requiring pt to make a list and then organize items into systematic schedule to complete all by specific time given. Pt making a list of  ingredients for meal prep as well as additional items needed.  With mod question cues, pt organizing meal prep with grilling hamburgers and hot dogs, therapist asking additional questions and giving prompts to facilitate increase sequencing and understanding.      03/27/24 Self-care: pt expressing continued concerns with STM impairments and processing speed.  Pt expressing desire to return to playing poker weekly with some friends, but that his spouse continues to have concerns.  OT educated pt on planned failure completed in therapeutic manner sometimes to increase awareness of deficits.  Also discussed progressive return to functional tasks with decreasing supervision as able such as with self-care tasks and cooking, and possibility of similar process with return to poker.  Further discussing options to allow safe return to leisure tasks, such as ride with a friend, just observe, team play, and/or play without money. Dual tasking: engaged in cognitive/motor task with standing balance while retrieving and placing resistance clothespins onto vertical dowel.  OT then challenging pt to identify items in category based on colored clothespins.  Pt initially only able to recall 2 of 4 categories, therefore OT writing color/category to increase recall.  Pt independently utilizing written prompt with increased ability to sequence and then name items based on category.  Pt demonstrating increased time when naming items in category - question continued issues with word finding.  Pt demonstrating good balance and activity tolerance, standing for 7 mins while incorporating reaching. Cards: engaged in card game with OT challenging pt to teach therapist [place]  [employer]em to facilitate increased sequencing, problem solving, and awareness with leisure task of playing poker.  Pt demonstrating good awareness and sequencing, however therapist with decreased knowledge of game and therefore speed to adequately challenge pt's play  speed and awareness.  OT encouraged pt to discuss with spouse to problem solve areas of concern and facilitate a way for pt to safely increase engagement in leisure pursuits.    03/25/24 SLUMS: pt scoring 25/30 with errors in recall of objects and ability to name animals in 1 minute.  Pt recalling 1/5 objects after delay and able to name 10 animals in one minute.  Memory strategies: OT educating pt on memory strategies with association, repeating, and "chunking" into categories - providing examples of each.  OT providing pt with additional suggestions with use of calendar (both written and digital), use of alarms/reminders, and list making. Pill box assessment: pt completed pill box test with 100% accuracy, in 6:39 minutes.  Pt demonstrates good sequencing and multi-tasking throughout task.  Pt demonstrating difficulty with opening variety of pill bottles and dropping 3 pills.  Educated on establishing a system and routine with meds to increase recall. A Measure of Executive Functioning and Estimate of Medication Management. A straight pass/fail designation is determined by 3 or more errors of omission or misplacement on the task. The pt completed  the test with 0 errors, but requiring greater than the 5 min time limit. FMC: provided pt with demonstration and rationale of in-hand manipulation and coordination tasks with focus on improving functional use.  OT providing with handout (see pt instructions) with instruction to complete 1,2,3 and 7.    PATIENT EDUCATION: Education details: ongoing condition specific education, see above for education on memory strategies and sequencing. Person educated: Patient Education method: Explanation and Handouts Education comprehension: needs further education  HOME EXERCISE PROGRAM: 03/25/24 - memory strategies and coordination (see pt instructions)   GOALS: Goals reviewed with patient? Yes  SHORT TERM GOALS: Target date: 04/11/24  Pt will be independent  with fine and gross motor control HEP with use of visual handouts. Baseline: slower overall with fine and gross motor tasks Goal status: in progress  2.  Pt will verbalize understanding of task modifications and/or potential AE needs to increase ease, safety, and independence w/ ADLs and IADLs. Baseline: limited engagement in IADLs, slower overall with ADLs Goal status: in progress  3.  Pt will demonstrate improved memory, sequencing, and problem solving as demonstrated by improvements in score on pill box assessment by ability to complete in 5 mins time limit with </= to 2 errors.  Baseline: wife managing meds/ TBD 03/25/24 - no errors, but required 6:39 to complete Goal status: in progress  4.  Pt will verbalize understanding of energy conservation strategies.  Baseline: decreased endurance  Goal status: in progress   LONG TERM GOALS: Target date: 05/02/24  Pt will demonstrate understanding of memory compensations and ways to keep thinking skills sharp Baseline:  Goal status:  in progress  2.  Pt will perform dynamic standing task for 10 mins as needed to complete simple IADLs w/o LOB using DME and/or countertop support prn. Baseline: decreased engagement in IADLs Goal status:  in progress  3.  Pt will demonstrate ability to sequence simple functional task (simple snack prep, laundry task, etc) at Mod I level with good safety awareness and ability to utilize memory strategies for recall.  Baseline: decreased engagement in IADLs Goal status:  in progress  4.  Pt will navigate a moderately busy environment, completing dual task activity and/or following multi-step commands with 90% accuracy Baseline: impaired memory and thinking strategies and endurance impacting return to work Goal status:  in progress  5.  Pt will be independent with utilization of memory strategies to allow for increased independence with managing personal appointments and in preparation for return to  work. Baseline: decreased memory and thinking strategies Goal status:  in progress  6.  Patient will report at least two-point increase in average PSFS score or at least three-point increase in a single activity score indicating functionally significant improvement given minimum detectable change. Baseline: 3.0 Goal status:  in progress  ASSESSMENT:  CLINICAL IMPRESSION: Pt continues to require increased time for sequencing and initiation during cognitive/planning activity.  Pt with good sequencing and speed during novel card game, however drawing from incorrect pile initially.  Pt reporting understanding of memory strategies for use of habits and completing tasks in same manner and placing items in same place to facilitate increased recall of sequencing and/or location. Pt will continue to benefit from focus on memory, especially short term memory and establishing a process or routine to increase recall.  Pt will continue benefit from skilled occupational therapy services to address coordination, pain management, balance, GM/FM control, cognition, safety awareness, introduction of compensatory strategies/AE prn, visual-perception, and implementation of an HEP  to improve participation and safety during ADLs and IADLs.     PERFORMANCE DEFICITS: in functional skills including ADLs, IADLs, coordination, pain, Fine motor control, Gross motor control, balance, body mechanics, endurance, decreased knowledge of precautions, and UE functional use, cognitive skills including emotional, energy/drive, memory, safety awareness, and thought, and psychosocial skills including coping strategies and routines and behaviors.     PLAN:  OT FREQUENCY: 2x/week  OT DURATION: 6 weeks  PLANNED INTERVENTIONS: 97168 OT Re-evaluation, 97535 self care/ADL training, 45409 therapeutic exercise, 97530 therapeutic activity, 97112 neuromuscular re-education, functional mobility training, visual/perceptual  remediation/compensation, psychosocial skills training, energy conservation, coping strategies training, patient/family education, and DME and/or AE instructions  RECOMMENDED OTHER SERVICES: may benefit from SLP, TBD after additional session and assessment of SLUMS  CONSULTED AND AGREED WITH PLAN OF CARE: Patient  PLAN FOR NEXT SESSION: provide education on memory compensation strategies and keeping thinking skills sharp  Start with coordination HEP and work on [employer] activities and recall strategies.  Cognitive/motor dual tasking while naming items in category    Anthonette Kinsman, OTR/L 04/02/2024, 11:15 AM   [facility] at [facility] [address] [place], [address] [place], [place] [address] Phone # [phone] Fax # [phone]

## 2024-04-03 ENCOUNTER — Other Ambulatory Visit (HOSPITAL_COMMUNITY): Payer: Self-pay | Admitting: Neurosurgery

## 2024-04-03 DIAGNOSIS — S065XAA Traumatic subdural hemorrhage with loss of consciousness status unknown, initial encounter: Secondary | ICD-10-CM

## 2024-04-04 ENCOUNTER — Ambulatory Visit: Admitting: Occupational Therapy

## 2024-04-04 ENCOUNTER — Ambulatory Visit

## 2024-04-04 DIAGNOSIS — I69118 Other symptoms and signs involving cognitive functions following nontraumatic intracerebral hemorrhage: Secondary | ICD-10-CM

## 2024-04-04 DIAGNOSIS — M6281 Muscle weakness (generalized): Secondary | ICD-10-CM

## 2024-04-04 DIAGNOSIS — R2689 Other abnormalities of gait and mobility: Secondary | ICD-10-CM

## 2024-04-04 DIAGNOSIS — R278 Other lack of coordination: Secondary | ICD-10-CM | POA: Diagnosis not present

## 2024-04-04 DIAGNOSIS — R262 Difficulty in walking, not elsewhere classified: Secondary | ICD-10-CM

## 2024-04-04 DIAGNOSIS — R2681 Unsteadiness on feet: Secondary | ICD-10-CM

## 2024-04-04 DIAGNOSIS — I69119 Unspecified symptoms and signs involving cognitive functions following nontraumatic intracerebral hemorrhage: Secondary | ICD-10-CM

## 2024-04-04 NOTE — Therapy (Signed)
 OUTPATIENT PHYSICAL THERAPY NEURO TREATMENT   Patient Name: Micheal Ayers MRN: [id number] DOB:24-Mar-1961, 63 y.o., male Today's Date: 04/04/2024   PCP: Olin Bertin, MD REFERRING PROVIDER: Zelda Hickman, PA-C  END OF SESSION:  PT End of Session - 04/04/24 0851     Visit Number 5    Number of Visits 13    Date for PT Re-Evaluation 05/01/24    Authorization Type [employer]    Authorization Time Period 60 visit limit combined    Authorization - Visit Number 4    Authorization - Number of Visits 30    PT Start Time [phone]    PT Stop Time 0930    PT Time Calculation (min) 40 min    Equipment Utilized During Treatment Gait belt    Activity Tolerance Patient tolerated treatment well    Behavior During Therapy WFL for tasks assessed/performed              Past Medical History:  Diagnosis Date   ADD (attention deficit disorder)    Allergic rhinitis, cause unspecified    Chronic kidney disease    Acute renal failure   Hemochromatosis, hereditary (HCC)    seen at [facility]   Hypertension    Major depressive disorder    Stroke [facility])    Past Surgical History:  Procedure Laterality Date   CRANIOTOMY Left 02/24/2024   Procedure: CRANIOTOMY HEMATOMA EVACUATION SUBDURAL;  Surgeon: Joaquin Mulberry, MD;  Location: [facility] OR;  Service: Neurosurgery;  Laterality: Left;   elbow surgury  1969   left elbow   INGUINAL HERNIA REPAIR  2003   left   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/2023   Procedure: Shunt Placment - right occipital;  Surgeon: Agustina Aldrich, MD;  Location: [facility] OR;  Service: Neurosurgery;  Laterality: Right;   Patient Active Problem List   Diagnosis Date Noted   Hyponatremia 03/17/2024   GERD (gastroesophageal reflux disease) 03/17/2024   Anxiety disorder with panic attacks 03/17/2024   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 03/11/2024   Subdural hematoma (HCC) 02/24/2024   S/P craniotomy 02/24/2024   Obstructive hydrocephalus (HCC)  03/13/2023   Ischemic cerebrovascular accident (CVA) (HCC) 02/19/2023   Cerebral ventriculomegaly 02/19/2023   Acute renal failure superimposed on stage 2 chronic kidney disease (HCC) 02/19/2023   Hypertensive urgency 02/19/2023   Abnormal CXR 02/19/2023   CVA (cerebral vascular accident) (HCC) 02/19/2023   Fatigue 09/20/2012   Family history of hemochromatosis 09/20/2012   Preventative health care 09/06/2012   ADD (attention deficit disorder)    Allergic rhinitis     ONSET DATE: 02/19/24  REFERRING DIAG:  X52.841 (ICD-10-CM) - 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 male with history of CVA 01/2023, obstructive hydrocephalus status post VP shunt 02/2023, chronic/GAD, panic attacks, low back to 02/24/2024 with 2-day history of right facial droop and difficulty DH with signs of septations and small subacute right parafalcine SDH with midline shift.  He underwent left for evacuation of bleed by Dr. Rochelle Chu.  Postop placed on Decadron  as well as Keppra  for seizure prophylaxis.  Follow-up CT showed resolution of midline shift Decadron  was discharged.  Therapy was working with patient who was noted to have balance deficits with decreasing coordination on right, difficulty weightbearing through RLE as well as tendency to drift when walking.  He was independent working prior to admission.  CIR was recommended due to functional decline.  PAIN:  Are  you having pain? Yes: NPRS scale: 0/10 Pain location: behind eyes Pain description: pressure Aggravating factors:   Relieving factors:    PRECAUTIONS: Other:  Special Instructions: No driving, strenuous activity or return to work till cleared by MD. Recommend repeat check BMET in 1-2 weeks to monitor sodium levels  RED FLAGS: None   WEIGHT BEARING RESTRICTIONS: No  FALLS: Has patient fallen in last 6 months? No  LIVING ENVIRONMENT: Lives with: lives with their family and lives with their spouse Lives in: House/apartment Stairs: Yes: Internal: flight steps; can reach both Has following equipment at home: Otho Blitz - 2 wheeled Master on main floor. PLOF: Independent,   PATIENT GOALS: Improve balance, walking, and mobility  OBJECTIVE:   TODAY'S TREATMENT: 04/04/24 Activity Comments  Dynamic balance 2x45 ft -retrowalk -tandem -sidestep  Reverse lunge 1x5 w/ BUE support  Quadruped to standing 1x5 w/ 2 reps unilat UE  obstacles 8" step, 6" hurdles w/ airex in space-double limb to single limb  Vitals: 116/92 mmHg, 116 bpm   Resisted walking 2x2 min Retro-forward 20# Forward-retro 15#      TODAY'S TREATMENT: 04/02/24 Activity Comments  Vitals at start of session  112/45mmHg, 99 bpm, 95%  review of HEP: Romberg 30" romberg EC 30" romberg + head turns 30" semi tandem 30" STS staggered (R foot back) 10x standing foot lift on box Mild sway EC and romberg head. Weaned UE support with lift offs- more challenging with R LE stabilizing   gait training with trekking pole in L hand ~193ft Cueing for safe placement of pole and proper sequence; CGA  in II bars: R step ups 8" 10x  side steps over hurdles  Cues to "march" knees up to step over             HOME EXERCISE PROGRAM Last updated: 04/02/24 Access Code: [email or web address] URL: [email or web address] Date: 04/02/2024 Prepared by: [facility] - Outpatient  Rehab - [facility]  Exercises - Corner Balance Feet  Together With Eyes Closed  - 1 x daily - 7 x weekly - 3 sets - 30 sec hold - Corner Balance Feet Together: Eyes Open With Head Turns  - 1 x daily - 7 x weekly - 3 sets - 30 sec hold - Corner Balance Feet Together: Eyes Closed With Head Turns  - 1 x daily - 7 x weekly - 3 sets - 30 sec hold - Sit to Stand in Stride with AFO and UE Assist in LE Alignment  - 1 x daily - 7 x weekly - 3 sets - 10 reps - Standing Foot Lift on Box (BKA)  - 1 x daily - 7 x weekly -  2-3 sets - 10 reps  PATIENT EDUCATION: Education details: encouraged increased water intake to see if it resolves lightheaded sensation; HEP review and consolidation; encouraged him to speak to OT about his memory concerns Person educated: Patient Education method: Explanation, Demonstration, Tactile cues, Verbal cues, and Handouts Education comprehension: verbalized understanding and returned demonstration    Note: Objective measures were completed at Evaluation unless otherwise noted.  DIAGNOSTIC FINDINGS:  IMPRESSION: 1. Increasing postoperative Pneumocephalus, now affecting both anterior frontal lobes (see series 4, image 17). 2. Subdural drain removed with interval increased residual Left Subdural Hematoma; 12 mm maximal thickness now vs 6-8 mm on 02/25/2022. Small volume para falcine SDH unchanged. 3. No significant midline shift. Stable basilar cisterns. Stable CSF shunt and ventriculomegaly.  COGNITION: Overall cognitive status: History of cognitive impairments - at baseline per pt report since onset of hydrocephalus having increased difficulty with conversation and recall   SENSATION: WFL  COORDINATION: Difficulty with rapid alternating movements on right Finger to nose WNL Heel to shin WNL  EDEMA:  none  MUSCLE TONE: WNL    POSTURE: No Significant postural limitations  LOWER EXTREMITY ROM:     Active  Right Eval Left Eval  Hip flexion    Hip extension    Hip abduction    Hip adduction    Hip  internal rotation    Hip external rotation    Knee flexion    Knee extension    Ankle dorsiflexion 8 15  Ankle plantarflexion    Ankle inversion    Ankle eversion     (Blank rows = not tested)  LOWER EXTREMITY MMT:    MMT Right Eval Left Eval  Hip flexion 4 5  Hip extension    Hip abduction 3 5  Hip adduction 4 5  Hip internal rotation    Hip external rotation    Knee flexion 4 5  Knee extension 4 5  Ankle dorsiflexion 3+ 5  Ankle plantarflexion    Ankle inversion    Ankle eversion    (Blank rows = not tested)  BED MOBILITY:  Not tested  TRANSFERS: Independent Floor to stand: NT    CURB:  Findings: CGA  STAIRS: Findings: Level of Assistance: SBA, Number of Stairs: 6, Height of Stairs: 4-6"   , and Comments: BHR GAIT: Findings: Distance walked: 150, Level of assistance: Modified independence, and Comments: right lateral trunk lean in stance phase  FUNCTIONAL TESTS:  5 times sit to stand: 18.60 sec Timed up and go (TUG): 11.62 sec; TUG cog: 20.75 sec Berg Balance Scale: 43/56 10 meter walk test: 12.22 sec = 2.68 ft/sec  M-CTSIB  Condition 1: Firm Surface, EO 30 Sec, Mild Sway  Condition 2: Firm Surface, EC 30 Sec, Mild and Moderate Sway  Condition 3: Foam Surface, EO 30 Sec, Mild and Moderate Sway  Condition 4: Foam Surface, EC 20 Sec, Moderate and Severe Sway  TREATMENT DATE: 03/20/24      GOALS: Goals reviewed with patient? Yes  SHORT TERM GOALS: Target date: 04/10/2024    Patient will be independent in HEP to improve functional outcomes Baseline: Goal status: INITIAL  2.  Demo improved BLE strength and reduced risk for falls per time 15 sec 5xSTS test Baseline: 18 sec Goal status: INITIAL  3.  Improve safety and access to home environment navigating flight of stairs modified independence Baseline: SBA Goal  status: INITIAL  4.  Improve proprioceptive awareness and postural stability for improved safety in ADL by mild sway condition 4 M-CTSIB Baseline: mod-severe x 20 sec Goal status: INITIAL    LONG TERM GOALS: Target date: 05/01/2024    Demo 10% or less difference TUG test with cognitive dual-task component to improve safety with mobility Baseline: 78% (TUG reg 11.62; TUG cog 20.75 sec) Goal status: INITIAL  2.  Demo low risk for falls per score 52/56 Berg Balance Test Baseline: 43/56 Goal status: INITIAL  3.  Increase gait speed to 3.0 ft/sec to improve efficiency of community ambulation Baseline: 2.68 ft/sec Goal status: INITIAL  4.  Demo independent ambulation community level distances over various surfaces to improve safety in environment Baseline: modified indep level, supervision-SBA uneven Goal status: INITIAL  5.  Independent floor to stand transfers for improved functional mobility Baseline: NT Goal status: INITIAL    ASSESSMENT:  CLINICAL IMPRESSION: Dynamic balance activities to promote single limb support and improved stability under loading response with emphasis on activities of larger amplitude movement tolerating well with minimal unsteadiness. Trials of floor to stand movements to improve strength/power for arising unassisted requiring BUE support initially, progressing to unilat UE support for quadruped to stand. Continued sessions to progress POC details to improve mobility and reduce risk for falls.   OBJECTIVE IMPAIRMENTS: Abnormal gait, decreased activity tolerance, decreased balance, decreased coordination, decreased endurance, decreased mobility, difficulty walking, decreased ROM, and decreased strength.   ACTIVITY LIMITATIONS: carrying, lifting, bending, squatting, stairs, reach over head, and locomotion level  PARTICIPATION LIMITATIONS: meal prep, cleaning, laundry, interpersonal relationship, driving, shopping, community activity, occupation, and yard  work  PERSONAL FACTORS: Age, Time since onset of injury/illness/exacerbation, and 1-2 comorbidities: hx of hydrocephalus, HTN are also affecting patient's functional outcome.   REHAB POTENTIAL: Excellent  CLINICAL DECISION MAKING: Evolving/moderate complexity  EVALUATION COMPLEXITY: Moderate  PLAN:  PT FREQUENCY: 2x/week  PT DURATION: 6 weeks  PLANNED INTERVENTIONS: 97750- Physical Performance Testing, 97110-Therapeutic exercises, 97530- Therapeutic activity, [id number]- Neuromuscular re-education, 97535- Self Care, [id number]- Manual therapy, [id number]- Gait training, [id number]- Orthotic Initial, [id number]- Orthotic/Prosthetic subsequent, [phone]- Aquatic Therapy, and DME instructions  PLAN FOR NEXT SESSION: trekking pole gait, foot clearance, balance   10:25 AM, 04/04/24 M. Kelly Karyss Frese, PT, DPT Physical Therapist- [place] Office Number: [phone]

## 2024-04-04 NOTE — Therapy (Signed)
 OUTPATIENT OCCUPATIONAL THERAPY NEURO  Treatment Note  Patient Name: Micheal Ayers MRN: [id number] DOB:1960-12-26, 63 y.o., male Today's Date: 04/04/2024  PCP: Olin Bertin, MD REFERRING PROVIDER: Zelda Hickman, PA-C  END OF SESSION:  OT End of Session - 04/04/24 0937     Visit Number 5    Number of Visits 13    Date for OT Re-Evaluation 05/02/24    Authorization Type [employer] - 60 visits combined (OT/PT/ST)    OT Start Time [phone]   arrival from PT   OT Stop Time 1016    OT Time Calculation (min) 40 min                 Past Medical History:  Diagnosis Date   ADD (attention deficit disorder)    Allergic rhinitis, cause unspecified    Chronic kidney disease    Acute renal failure   Hemochromatosis, hereditary (HCC)    seen at [facility]   Hypertension    Major depressive disorder    Stroke [facility])    Past Surgical History:  Procedure Laterality Date   CRANIOTOMY Left 02/24/2024   Procedure: CRANIOTOMY HEMATOMA EVACUATION SUBDURAL;  Surgeon: Joaquin Mulberry, MD;  Location: [facility] OR;  Service: Neurosurgery;  Laterality: Left;   elbow surgury  1969   left elbow   INGUINAL HERNIA REPAIR  2003   left   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/2023   Procedure: Shunt Placment - right occipital;  Surgeon: Agustina Aldrich, MD;  Location: [facility] OR;  Service: Neurosurgery;  Laterality: Right;   Patient Active Problem List   Diagnosis Date Noted   Hyponatremia 03/17/2024   GERD (gastroesophageal reflux disease) 03/17/2024   Anxiety disorder with panic attacks 03/17/2024   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 03/11/2024   Subdural hematoma (HCC) 02/24/2024   S/P craniotomy 02/24/2024   Obstructive hydrocephalus (HCC) 03/13/2023   Ischemic cerebrovascular accident (CVA) (HCC) 02/19/2023   Cerebral ventriculomegaly 02/19/2023   Acute renal failure superimposed on stage 2 chronic kidney disease (HCC) 02/19/2023   Hypertensive urgency 02/19/2023    Abnormal CXR 02/19/2023   CVA (cerebral vascular accident) (HCC) 02/19/2023   Fatigue 09/20/2012   Family history of hemochromatosis 09/20/2012   Preventative health care 09/06/2012   ADD (attention deficit disorder)    Allergic rhinitis     ONSET DATE: 02/24/24  REFERRING DIAG: F62.130 (ICD-10-CM) - 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 He was found to have massive left SDH with signs of septations and small subacute right parafalcine SDH with midline shift. He was evaluated by Dr. Rochelle Chu and underwent left fronto-temporoparietal crani for evacuation of large chronic LDH the same day. He was placed on decadron  and on Keppra  for seizure prophylaxis and serial CT head showed increaseing post op pneumocephalus with residual L-SDH decreased to 6-8 mm from 12 mm and resolution of medline shift.  PRECAUTIONS: None  WEIGHT BEARING RESTRICTIONS: No  PAIN:  Are you having pain? No  FALLS: Has patient fallen in last 6 months? 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  HAND DOMINANCE: Right  ADLs: Overall ADLs: Mod I - reports more time consuming overall, mild difficulty with getting in/out of shower but no physical assistance Transfers/ambulation related to ADLs: Mod I - using RW 50% of time in the home and when walking in the cul-de-sac Equipment: Grab bars, Walk in shower, and hand held shower head  IADLs: Light housekeeping: is not currently performing and wife did majority even prior Meal Prep: is not currently performing and wife did majority even prior Community mobility: not currently cleared to drive  Medication management: spouse is assisting pt with taking meds appropriately Financial management: spouse is managing bills at this time Handwriting: 100% legible completed PPT #1([place]): 11.66 sec  MOBILITY STATUS: slower and decreased endurance  POSTURE COMMENTS:  No Significant postural limitations  ACTIVITY TOLERANCE: Activity tolerance: diminished  FUNCTIONAL OUTCOME MEASURES: PSFS: 3.0   UPPER EXTREMITY ROM:  WFL bilaterally  UPPER EXTREMITY MMT:   grossly 4/5 bilaterally  COORDINATION: Finger Nose Finger test: mild dysmetria on R and mildly slower compared to L 9 Hole Peg test: Right: 24.53 sec; Left: 29.06 sec Box and Blocks:  Right 45 blocks, Left 43 blocks - frequently picking up 2 blocks but able to recall to only pick up one at a time RAM: pt with decreased motor control of RUE with rapid hand movements, demonstrating "tremor-like" movement on R  SENSATION: WFL  COGNITION: Overall cognitive status: No family/caregiver present to determine baseline cognitive  functioning Brief Interview for Mental Status:   Memory:  Word Repetition:   "Sock"  "Blue"  "Bed"   Orientation Level:    Year:   2025  Month: April  Day of Week: Thursday  Memory:  Recall: Able to recall Blue and bed, unable to recall sock even when given a cue.      VISION: Subjective report: "I can't see far away" no changes Baseline vision: Wears glasses for distance only   VISION ASSESSMENT: Ocular ROM: WFL Gaze preference/alignment: WDL Tracking/Visual pursuits: Able to track stimulus in all quads without difficulty Saccades: WFL Visual Fields: no apparent deficits  OBSERVATIONS: Pt requiring increased time to answer questions and somewhat tangential when asked questions about pt impairments, possibly demonstrating decreased awareness vs decreased memory.  Pt requiring increased time with 9 hole peg test and Box and blocks, most likely due to overall slowed processing as pt reports "everything is a process".  TREATMENT DATE:  04/04/24 Alternating attention: engaged in small peg board pattern replication with use of key to identify correlating colors, challenging alternating attention, visual perception, and sequencing. Pt demonstrating good coordination and manipulation of pegs in-hand to place in peg board.  Pt completing in horizontal scanning with no omission of pegs or errors.  OT reiterating education on sequencing and maintaining routine to increase recall.   Memory: engaged in completion of additional peg board replication with focus on recall of pattern and recall of items named.  Pt able to recall 2 of 5 categories, requiring cues and/or use of written categories for increased recall.  Pt with no repeating of items during naming, however completing with all items in same category before progressing to next category.  OT providing question cues to aid  in word finding when identifying "place" providing cue of "where might someone go?" Cognitive/motor dual tasking: engaged in standing activity while reaching for colored clothespins and placing onto vertical dowel while challenging cognition, again with naming items based on category.  OT challenging pt to alternate colors, therefore requiring increased recall of categories and named items.  Pt demonstrating good ability to complete through 3 cycles of 3 categories, but then demonstrating decreased recall of categories as well as items previously named.      04/02/24 Multi-tasking: OT instructed pt in novel card game "Speed" - to improve cognition, sequencing, and memory during multi-step tasks and educating on functional carryover of multi-tasking. Pt demonstrating good sequencing with action of task, however frequently drawing from incorrect pile.  OT providing education on placement of cards and carryover to functional tasks to facilitate increased recall - e.g. omitting distractions or clutter and placing items in same place(s) to facilitate increased recall. Planning/organizing: engaged in simple planning/problem solving prompt requiring pt to make a list and then organize items into systematic schedule to complete all by specific time given. Pt making a list of ingredients for meal prep as well as additional items needed.  With mod question cues, pt organizing meal prep with grilling hamburgers and hot dogs, therapist asking additional questions and giving prompts to facilitate increase sequencing and understanding.      03/27/24 Self-care: pt expressing continued concerns with STM impairments and processing speed.  Pt expressing desire to return to playing poker weekly with some friends, but that his spouse continues to have concerns.  OT educated pt on planned failure completed in therapeutic manner sometimes to increase awareness of deficits.  Also discussed progressive return to functional tasks  with decreasing supervision as able such as with self-care tasks and cooking, and possibility of similar process with return to poker.  Further discussing options to allow safe return to leisure tasks, such as ride with a friend, just observe, team play, and/or play without money. Dual tasking: engaged in cognitive/motor task with standing balance while retrieving and placing resistance clothespins onto vertical dowel.  OT then challenging pt to identify items in category based on colored clothespins.  Pt initially only able to recall 2 of 4 categories, therefore OT writing color/category to increase recall.  Pt independently utilizing written prompt with increased ability to sequence and then name items based on category.  Pt demonstrating increased time when naming items in category - question continued issues with word finding.  Pt demonstrating good balance and activity tolerance, standing for 7 mins while incorporating reaching. Cards: engaged in card game with OT challenging pt to teach therapist [place]  [employer]em to facilitate increased sequencing, problem solving,  and awareness with leisure task of playing poker.  Pt demonstrating good awareness and sequencing, however therapist with decreased knowledge of game and therefore speed to adequately challenge pt's play speed and awareness.  OT encouraged pt to discuss with spouse to problem solve areas of concern and facilitate a way for pt to safely increase engagement in leisure pursuits.   PATIENT EDUCATION: Education details: ongoing condition specific education, see above for education on memory strategies and sequencing. Person educated: Patient Education method: Explanation and Handouts Education comprehension: needs further education  HOME EXERCISE PROGRAM: 03/25/24 - memory strategies and coordination (see pt instructions)   GOALS: Goals reviewed with patient? Yes  SHORT TERM GOALS: Target date: 04/11/24  Pt will be independent with fine  and gross motor control HEP with use of visual handouts. Baseline: slower overall with fine and gross motor tasks Goal status: in progress  2.  Pt will verbalize understanding of task modifications and/or potential AE needs to increase ease, safety, and independence w/ ADLs and IADLs. Baseline: limited engagement in IADLs, slower overall with ADLs Goal status: in progress  3.  Pt will demonstrate improved memory, sequencing, and problem solving as demonstrated by improvements in score on pill box assessment by ability to complete in 5 mins time limit with </= to 2 errors.  Baseline: wife managing meds/ TBD 03/25/24 - no errors, but required 6:39 to complete Goal status: in progress  4.  Pt will verbalize understanding of energy conservation strategies.  Baseline: decreased endurance  Goal status: in progress   LONG TERM GOALS: Target date: 05/02/24  Pt will demonstrate understanding of memory compensations and ways to keep thinking skills sharp Baseline:  Goal status:  in progress  2.  Pt will perform dynamic standing task for 10 mins as needed to complete simple IADLs w/o LOB using DME and/or countertop support prn. Baseline: decreased engagement in IADLs Goal status:  in progress  3.  Pt will demonstrate ability to sequence simple functional task (simple snack prep, laundry task, etc) at Mod I level with good safety awareness and ability to utilize memory strategies for recall.  Baseline: decreased engagement in IADLs Goal status:  in progress  4.  Pt will navigate a moderately busy environment, completing dual task activity and/or following multi-step commands with 90% accuracy Baseline: impaired memory and thinking strategies and endurance impacting return to work Goal status:  in progress  5.  Pt will be independent with utilization of memory strategies to allow for increased independence with managing personal appointments and in preparation for return to work. Baseline:  decreased memory and thinking strategies Goal status:  in progress  6.  Patient will report at least two-point increase in average PSFS score or at least three-point increase in a single activity score indicating functionally significant improvement given minimum detectable change. Baseline: 3.0 Goal status:  in progress  ASSESSMENT:  CLINICAL IMPRESSION: Pt continues to require increased time for sequencing and initiation during cognitive/recall activity.  Pt with good sequencing and increased recall when completing naming in categories vs random.  Pt reporting understanding of education of functional carryover with completing tasks in appropriate sequence for increased recall and completing like tasks at same time to increase carryover and completion of tasks.  Pt will continue to benefit from focus on memory, especially short term memory and establishing a process or routine to increase recall.  Pt will continue benefit from skilled occupational therapy services to address coordination, pain management, balance, GM/FM control, cognition, safety awareness,  introduction of compensatory strategies/AE prn, visual-perception, and implementation of an HEP to improve participation and safety during ADLs and IADLs.     PERFORMANCE DEFICITS: in functional skills including ADLs, IADLs, coordination, pain, Fine motor control, Gross motor control, balance, body mechanics, endurance, decreased knowledge of precautions, and UE functional use, cognitive skills including emotional, energy/drive, memory, safety awareness, and thought, and psychosocial skills including coping strategies and routines and behaviors.     PLAN:  OT FREQUENCY: 2x/week  OT DURATION: 6 weeks  PLANNED INTERVENTIONS: 97168 OT Re-evaluation, 97535 self care/ADL training, 60454 therapeutic exercise, 97530 therapeutic activity, 97112 neuromuscular re-education, functional mobility training, visual/perceptual remediation/compensation,  psychosocial skills training, energy conservation, coping strategies training, patient/family education, and DME and/or AE instructions  RECOMMENDED OTHER SERVICES: may benefit from SLP, TBD after additional session and assessment of SLUMS  CONSULTED AND AGREED WITH PLAN OF CARE: Patient  PLAN FOR NEXT SESSION: provide education on memory compensation strategies and keeping thinking skills sharp  Start with coordination HEP and work on [employer] activities and recall strategies.  Cognitive/motor dual tasking while naming items in category    Anthonette Kinsman, OTR/L 04/04/2024, 9:38 AM   [facility] [address] [place], [address] [place], [place] [address] Phone # [phone] Fax # [phone]

## 2024-04-07 ENCOUNTER — Ambulatory Visit

## 2024-04-07 ENCOUNTER — Ambulatory Visit: Admitting: Occupational Therapy

## 2024-04-07 DIAGNOSIS — I69118 Other symptoms and signs involving cognitive functions following nontraumatic intracerebral hemorrhage: Secondary | ICD-10-CM

## 2024-04-07 DIAGNOSIS — R2689 Other abnormalities of gait and mobility: Secondary | ICD-10-CM

## 2024-04-07 DIAGNOSIS — R2681 Unsteadiness on feet: Secondary | ICD-10-CM

## 2024-04-07 DIAGNOSIS — I69119 Unspecified symptoms and signs involving cognitive functions following nontraumatic intracerebral hemorrhage: Secondary | ICD-10-CM

## 2024-04-07 DIAGNOSIS — R278 Other lack of coordination: Secondary | ICD-10-CM | POA: Diagnosis not present

## 2024-04-07 DIAGNOSIS — R262 Difficulty in walking, not elsewhere classified: Secondary | ICD-10-CM

## 2024-04-07 DIAGNOSIS — M6281 Muscle weakness (generalized): Secondary | ICD-10-CM

## 2024-04-07 NOTE — Therapy (Signed)
 OUTPATIENT OCCUPATIONAL THERAPY NEURO  Treatment Note  Patient Name: ACETON MACEDA MRN: [id number] DOB:1961-07-02, 63 y.o., male Today's Date: 04/07/2024  PCP: Olin Bertin, MD REFERRING PROVIDER: Zelda Hickman, PA-C  END OF SESSION:  OT End of Session - 04/07/24 0939     Visit Number 6    Number of Visits 13    Date for OT Re-Evaluation 05/02/24    Authorization Type [employer] - 60 visits combined (OT/PT/ST)    OT Start Time [phone]    OT Stop Time 1013    OT Time Calculation (min) 42 min                  Past Medical History:  Diagnosis Date   ADD (attention deficit disorder)    Allergic rhinitis, cause unspecified    Chronic kidney disease    Acute renal failure   Hemochromatosis, hereditary (HCC)    seen at [facility]   Hypertension    Major depressive disorder    Stroke [facility])    Past Surgical History:  Procedure Laterality Date   CRANIOTOMY Left 02/24/2024   Procedure: CRANIOTOMY HEMATOMA EVACUATION SUBDURAL;  Surgeon: Joaquin Mulberry, MD;  Location: [facility] OR;  Service: Neurosurgery;  Laterality: Left;   elbow surgury  1969   left elbow   INGUINAL HERNIA REPAIR  2003   left   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/2023   Procedure: Shunt Placment - right occipital;  Surgeon: Agustina Aldrich, MD;  Location: [facility] OR;  Service: Neurosurgery;  Laterality: Right;   Patient Active Problem List   Diagnosis Date Noted   Hyponatremia 03/17/2024   GERD (gastroesophageal reflux disease) 03/17/2024   Anxiety disorder with panic attacks 03/17/2024   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 03/11/2024   Subdural hematoma (HCC) 02/24/2024   S/P craniotomy 02/24/2024   Obstructive hydrocephalus (HCC) 03/13/2023   Ischemic cerebrovascular accident (CVA) (HCC) 02/19/2023   Cerebral ventriculomegaly 02/19/2023   Acute renal failure superimposed on stage 2 chronic kidney disease (HCC) 02/19/2023   Hypertensive urgency 02/19/2023   Abnormal CXR  02/19/2023   CVA (cerebral vascular accident) (HCC) 02/19/2023   Fatigue 09/20/2012   Family history of hemochromatosis 09/20/2012   Preventative health care 09/06/2012   ADD (attention deficit disorder)    Allergic rhinitis     ONSET DATE: 02/24/24  REFERRING DIAG: H08.657 (ICD-10-CM) - 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 Pt states that his daughter moved this weekend but he did not physically assist with any aspect of the move as "my wife wouldn't let me".  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 He was found to have massive left SDH with signs of septations and small subacute right parafalcine SDH with midline shift. He was evaluated by Dr. Rochelle Chu and underwent left fronto-temporoparietal crani for evacuation of large chronic LDH the same day. He was placed on decadron  and on Keppra  for seizure prophylaxis and serial CT head showed increaseing post op pneumocephalus with residual L-SDH decreased to 6-8 mm from  12 mm and resolution of medline shift.  PRECAUTIONS: None  WEIGHT BEARING RESTRICTIONS: No  PAIN:  Are you having pain? No  FALLS: Has patient fallen in last 6 months? 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  HAND DOMINANCE: Right  ADLs: Overall ADLs: Mod I - reports more time consuming overall, mild difficulty with getting in/out of shower but no physical assistance Transfers/ambulation related to ADLs: Mod I - using RW 50% of time in the home and when walking in the cul-de-sac Equipment: Grab bars, Walk in shower, and hand held shower head  IADLs: Light housekeeping: is not currently performing and wife did majority even prior Meal Prep: is not currently performing and wife did majority even prior Community mobility: not currently cleared to drive  Medication management: spouse is assisting pt with taking meds appropriately Financial management: spouse is managing bills at this time Handwriting: 100% legible completed PPT #1([place]): 11.66 sec  MOBILITY STATUS: slower and decreased endurance  POSTURE COMMENTS:  No Significant postural limitations  ACTIVITY TOLERANCE: Activity tolerance: diminished  FUNCTIONAL OUTCOME MEASURES: PSFS: 3.0   UPPER EXTREMITY ROM:  WFL bilaterally  UPPER EXTREMITY MMT:   grossly 4/5 bilaterally  COORDINATION: Finger Nose Finger test: mild dysmetria on R and mildly slower compared to L 9 Hole Peg test: Right: 24.53 sec; Left: 29.06 sec Box and Blocks:  Right 45 blocks, Left 43 blocks - frequently picking up 2 blocks but able to recall to only pick up one at a time RAM: pt with decreased motor control of RUE with rapid hand movements, demonstrating "tremor-like"  movement on R  SENSATION: WFL  COGNITION: Overall cognitive status: No family/caregiver present to determine baseline cognitive functioning Brief Interview for Mental Status:   Memory:  Word Repetition:   "Sock"  "Blue"  "Bed"   Orientation Level:    Year:   2025  Month: April  Day of Week: Thursday  Memory:  Recall: Able to recall Blue and bed, unable to recall sock even when given a cue.      VISION: Subjective report: "I can't see far away" no changes Baseline vision: Wears glasses for distance only   VISION ASSESSMENT: Ocular ROM: WFL Gaze preference/alignment: WDL Tracking/Visual pursuits: Able to track stimulus in all quads without difficulty Saccades: WFL Visual Fields: no apparent deficits  OBSERVATIONS: Pt requiring increased time to answer questions and somewhat tangential when asked questions about pt impairments, possibly demonstrating decreased awareness vs decreased memory.  Pt requiring increased time with 9 hole peg test and Box and blocks, most likely due to overall slowed processing as pt reports "everything is a process".  TREATMENT DATE:  04/07/24 Pill box assessment: Pt completed pill box test with 100% accuracy, in 5:34 minutes.  Pt demonstrates good multi-tasking and probelm solving throughout task. Pt reports that his wife will dispense his meds for him. He is aware of the routine and amounts that he takes at each time.  Pt stating that he thinks she is concerned he may forget to take his meds or take them incorrectly so she does it for him at this point.  Pt also recognizes that she provides assistance in caring for her mother, so she may just be acting as caregiver for him as well.  Engaged in discussion about strategies to increase independence with reminders for meds, use of pill box, and/or routines to increase recall and completion  of taking meds. Pt reports that he feels his largest difficulties are the "mental challenges" that started when in the hospital, as he felt so limited due to safety concerns and restrictions with use of call bell and bed alarm for safety. Pt reports now that he has returned to driving and is completing more activities at home that he is feeling more sense of independence returning. Return to work: pt concerned with stamina in regards to return to work.  Pt reports that his job will be moving to a new location which will decrease his need to go up/down a ladder.  OT provided pt with RTW handout from [employer] and encouraged him to fill it out, discuss with wife, and identify potential areas of concern both with home, routine tasks as well as in regards to return to work.    04/04/24 Alternating attention: engaged in small peg board pattern replication with use of key to identify correlating colors, challenging alternating attention, visual perception, and sequencing. Pt demonstrating good coordination and manipulation of pegs in-hand to place in peg board.  Pt completing in horizontal scanning with no omission of pegs or errors.  OT reiterating education on sequencing and maintaining routine to increase recall.   Memory: engaged in completion of additional peg board replication with focus on recall of pattern and recall of items named.  Pt able to recall 2 of 5 categories, requiring cues and/or use of written categories for increased recall.  Pt with no repeating of items during naming, however completing with all items in same category before progressing to next category.  OT providing question cues to aid in word finding when identifying "place" providing cue of "where might someone go?" Cognitive/motor dual tasking: engaged in standing activity while reaching for colored clothespins and placing onto vertical dowel while challenging cognition, again with naming items based on category.  OT  challenging pt to alternate colors, therefore requiring increased recall of categories and named items.  Pt demonstrating good ability to complete through 3 cycles of 3 categories, but then demonstrating decreased recall of categories as well as items previously named.      04/02/24 Multi-tasking: OT instructed pt in novel card game "Speed" - to improve cognition, sequencing, and memory during multi-step tasks and educating on functional carryover of multi-tasking. Pt demonstrating good sequencing with action of task, however frequently drawing from incorrect pile.  OT providing education on placement of cards and carryover to functional tasks to facilitate increased recall - e.g. omitting distractions or clutter and placing items in same place(s) to facilitate increased recall. Planning/organizing: engaged in simple planning/problem solving prompt requiring pt to make a list and then organize items into systematic schedule to complete all by specific  time given. Pt making a list of ingredients for meal prep as well as additional items needed.  With mod question cues, pt organizing meal prep with grilling hamburgers and hot dogs, therapist asking additional questions and giving prompts to facilitate increase sequencing and understanding.      PATIENT EDUCATION: Education details: ongoing condition specific education, see above for education on memory strategies and sequencing as needed for meds and return to work. Person educated: Patient Education method: Explanation and Handouts Education comprehension: needs further education  HOME EXERCISE PROGRAM: 03/25/24 - memory strategies and coordination (see pt instructions)   GOALS: Goals reviewed with patient? Yes  SHORT TERM GOALS: Target date: 04/11/24  Pt will be independent with fine and gross motor control HEP with use of visual handouts. Baseline: slower overall with fine and gross motor tasks Goal status: in progress  2.  Pt will  verbalize understanding of task modifications and/or potential AE needs to increase ease, safety, and independence w/ ADLs and IADLs. Baseline: limited engagement in IADLs, slower overall with ADLs Goal status: in progress  3.  Pt will demonstrate improved memory, sequencing, and problem solving as demonstrated by improvements in score on pill box assessment by ability to complete in 5 mins time limit with </= to 2 errors.  Baseline: wife managing meds/ TBD 03/25/24 - no errors, but required 6:39 to complete 04/07/24 - no errors, completed in 5:34 Goal status: in progress  4.  Pt will verbalize understanding of energy conservation strategies.  Baseline: decreased endurance  Goal status: in progress   LONG TERM GOALS: Target date: 05/02/24  Pt will demonstrate understanding of memory compensations and ways to keep thinking skills sharp Baseline:  Goal status:  in progress  2.  Pt will perform dynamic standing task for 10 mins as needed to complete simple IADLs w/o LOB using DME and/or countertop support prn. Baseline: decreased engagement in IADLs Goal status:  in progress  3.  Pt will demonstrate ability to sequence simple functional task (simple snack prep, laundry task, etc) at Mod I level with good safety awareness and ability to utilize memory strategies for recall.  Baseline: decreased engagement in IADLs Goal status:  in progress  4.  Pt will navigate a moderately busy environment, completing dual task activity and/or following multi-step commands with 90% accuracy Baseline: impaired memory and thinking strategies and endurance impacting return to work Goal status:  in progress  5.  Pt will be independent with utilization of memory strategies to allow for increased independence with managing personal appointments and in preparation for return to work. Baseline: decreased memory and thinking strategies Goal status:  in progress  6.  Patient will report at least two-point  increase in average PSFS score or at least three-point increase in a single activity score indicating functionally significant improvement given minimum detectable change. Baseline: 3.0 Goal status:  in progress  ASSESSMENT:  CLINICAL IMPRESSION: Pt continues to require min increased time for sequencing and initiation during cognitive/recall activity.  Pt with improved time when completing pill box assessment and verbalizing understanding of alternative strategies to increase recall and independence with meds PRN.  Pt reporting understanding of need to resume various aspects of routine to increase both endurance and cognition.  Pt will continue to benefit from focus on memory, especially short term memory and establishing a process or routine to increase recall.  Pt will continue benefit from skilled occupational therapy services to address coordination, pain management, balance, GM/FM control, cognition, safety awareness, introduction  of compensatory strategies/AE prn, visual-perception, and implementation of an HEP to improve participation and safety during ADLs and IADLs.     PERFORMANCE DEFICITS: in functional skills including ADLs, IADLs, coordination, pain, Fine motor control, Gross motor control, balance, body mechanics, endurance, decreased knowledge of precautions, and UE functional use, cognitive skills including emotional, energy/drive, memory, safety awareness, and thought, and psychosocial skills including coping strategies and routines and behaviors.     PLAN:  OT FREQUENCY: 2x/week  OT DURATION: 6 weeks  PLANNED INTERVENTIONS: 97168 OT Re-evaluation, 97535 self care/ADL training, 16109 therapeutic exercise, 97530 therapeutic activity, 97112 neuromuscular re-education, functional mobility training, visual/perceptual remediation/compensation, psychosocial skills training, energy conservation, coping strategies training, patient/family education, and DME and/or AE  instructions  RECOMMENDED OTHER SERVICES: may benefit from SLP, TBD after additional session and assessment of SLUMS  CONSULTED AND AGREED WITH PLAN OF CARE: Patient  PLAN FOR NEXT SESSION: provide education on keeping thinking skills sharp  Review RTW checklist  Start with coordination HEP and work on [employer] activities and recall strategies.  Cognitive/motor dual tasking while naming items in category with mobility/ball toss   Anthonette Kinsman, OTR/L 04/07/2024, 9:39 AM   [facility] at [facility] [address] [place], [address] [place], [place] [address] Phone # [phone] Fax # [phone]

## 2024-04-07 NOTE — Therapy (Signed)
 OUTPATIENT PHYSICAL THERAPY NEURO TREATMENT   Patient Name: Micheal Ayers MRN: [id number] DOB:May 24, 1961, 63 y.o., male Today's Date: 04/07/2024   PCP: Olin Bertin, MD REFERRING PROVIDER: Zelda Hickman, PA-C  END OF SESSION:  PT End of Session - 04/07/24 1018     Visit Number 6    Number of Visits 13    Date for PT Re-Evaluation 05/01/24    Authorization Type [employer]    Authorization Time Period 60 visit limit combined    Authorization - Visit Number 5    Authorization - Number of Visits 30    PT Start Time 1015    PT Stop Time 1100    PT Time Calculation (min) 45 min    Equipment Utilized During Treatment Gait belt    Activity Tolerance Patient tolerated treatment well    Behavior During Therapy WFL for tasks assessed/performed              Past Medical History:  Diagnosis Date   ADD (attention deficit disorder)    Allergic rhinitis, cause unspecified    Chronic kidney disease    Acute renal failure   Hemochromatosis, hereditary (HCC)    seen at [facility]   Hypertension    Major depressive disorder    Stroke [facility])    Past Surgical History:  Procedure Laterality Date   CRANIOTOMY Left 02/24/2024   Procedure: CRANIOTOMY HEMATOMA EVACUATION SUBDURAL;  Surgeon: Joaquin Mulberry, MD;  Location: 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 03/11/2024   Subdural hematoma (HCC) 02/24/2024   S/P craniotomy 02/24/2024   Obstructive hydrocephalus (HCC)  03/13/2023   Ischemic cerebrovascular accident (CVA) (HCC) 02/19/2023   Cerebral ventriculomegaly 02/19/2023   Acute renal failure superimposed on stage 2 chronic kidney disease (HCC) 02/19/2023   Hypertensive urgency 02/19/2023   Abnormal CXR 02/19/2023   CVA (cerebral vascular accident) (HCC) 02/19/2023   Fatigue 09/20/2012   Family history of hemochromatosis 09/20/2012   Preventative health care 09/06/2012   ADD (attention deficit disorder)    Allergic rhinitis     ONSET DATE: 02/19/24  REFERRING DIAG:  Q59.563 (ICD-10-CM) - 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 male with history of CVA 01/2023, obstructive hydrocephalus status post VP shunt 02/2023, chronic/GAD, panic attacks, low back to 02/24/2024 with 2-day history of right facial droop and difficulty DH with signs of septations and small subacute right parafalcine SDH with midline shift.  He underwent left for evacuation of bleed by Dr. Rochelle Chu.  Postop placed on Decadron  as well as Keppra  for seizure prophylaxis.  Follow-up CT showed resolution of midline shift Decadron  was discharged.  Therapy was working with patient who was noted to have balance deficits with decreasing coordination on right, difficulty weightbearing through RLE as well as tendency to drift when walking.  He was independent  working prior to admission.  CIR was recommended due to functional decline.  PAIN:  Are you having pain? Yes: NPRS scale: 0/10 Pain location: behind eyes Pain description: pressure Aggravating factors:   Relieving factors:    PRECAUTIONS: Other:  Special Instructions: No driving, strenuous activity or return to work till cleared by MD. Recommend repeat check BMET in 1-2 weeks to monitor sodium levels  RED FLAGS: None   WEIGHT BEARING RESTRICTIONS: No  FALLS: Has patient fallen in last 6 months? No  LIVING ENVIRONMENT: Lives with: lives with their family and lives with their spouse Lives in: House/apartment Stairs: Yes: Internal: flight steps; can reach both Has following equipment at home: Otho Blitz - 2 wheeled Master on main floor. PLOF: Independent,   PATIENT GOALS: Improve balance, walking, and mobility  OBJECTIVE:   TODAY'S TREATMENT: 04/07/24 Activity Comments  Dynamic balance 2x45 ft -retrowalk -tandem -sidestep  LE PRE 3x10, 10#  -hip flex -hip abd -hamstring curls  Step height/advancement 3x10 10# LLE over 6" hurdles for heel strike  Multisensory balance Static and dynamic           TODAY'S TREATMENT: 04/04/24 Activity Comments  Dynamic balance 2x45 ft -retrowalk -tandem -sidestep  Reverse lunge 1x5 w/ BUE support  Quadruped to standing 1x5 w/ 2 reps unilat UE  obstacles 8" step, 6" hurdles w/ airex in space-double limb to single limb  Vitals: 116/92 mmHg, 116 bpm   Resisted walking 2x2 min Retro-forward 20# Forward-retro 15#        HOME EXERCISE PROGRAM Last updated: 04/02/24 Access Code: [email or web address] URL: [email or web address] Date: 04/02/2024 Prepared by: [facility] - Outpatient  Rehab - [facility]  Exercises - Corner Balance Feet Together With Eyes Closed  - 1 x daily - 7 x weekly - 3 sets - 30 sec hold - Corner Balance Feet Together: Eyes Open With Head Turns  - 1 x daily - 7 x weekly - 3 sets - 30 sec hold - Corner Balance  Feet Together: Eyes Closed With Head Turns  - 1 x daily - 7 x weekly - 3 sets - 30 sec hold - Sit to Stand in Stride with AFO and UE Assist in LE Alignment  - 1 x daily - 7 x weekly - 3 sets - 10 reps - Standing Foot Lift on Box (BKA)  - 1 x daily - 7 x weekly - 2-3 sets - 10 reps  PATIENT EDUCATION: Education details: encouraged increased water intake to see if it resolves lightheaded sensation; HEP review and consolidation; encouraged him to speak to OT about his memory concerns Person educated: Patient Education method: Explanation, Demonstration, Tactile cues,  Verbal cues, and Handouts Education comprehension: verbalized understanding and returned demonstration    Note: Objective measures were completed at Evaluation unless otherwise noted.  DIAGNOSTIC FINDINGS:  IMPRESSION: 1. Increasing postoperative Pneumocephalus, now affecting both anterior frontal lobes (see series 4, image 17). 2. Subdural drain removed with interval increased residual Left Subdural Hematoma; 12 mm maximal thickness now vs 6-8 mm on 02/25/2022. Small volume para falcine SDH unchanged. 3. No significant midline shift. Stable basilar cisterns. Stable CSF shunt and ventriculomegaly.  COGNITION: Overall cognitive status: History of cognitive impairments - at baseline per pt report since onset of hydrocephalus having increased difficulty with conversation and recall   SENSATION: WFL  COORDINATION: Difficulty with rapid alternating movements on right Finger to nose WNL Heel to shin WNL  EDEMA:  none  MUSCLE TONE: WNL    POSTURE: No Significant postural limitations  LOWER EXTREMITY ROM:     Active  Right Eval Left Eval  Hip flexion    Hip extension    Hip abduction    Hip adduction    Hip internal rotation    Hip external rotation    Knee flexion    Knee extension    Ankle dorsiflexion 8 15  Ankle plantarflexion    Ankle inversion    Ankle eversion     (Blank rows = not  tested)  LOWER EXTREMITY MMT:    MMT Right Eval Left Eval  Hip flexion 4 5  Hip extension    Hip abduction 3 5  Hip adduction 4 5  Hip internal rotation    Hip external rotation    Knee flexion 4 5  Knee extension 4 5  Ankle dorsiflexion 3+ 5  Ankle plantarflexion    Ankle inversion    Ankle eversion    (Blank rows = not tested)  BED MOBILITY:  Not tested  TRANSFERS: Independent Floor to stand: NT    CURB:  Findings: CGA  STAIRS: Findings: Level of Assistance: SBA, Number of Stairs: 6, Height of Stairs: 4-6"   , and Comments: BHR GAIT: Findings: Distance walked: 150, Level of assistance: Modified independence, and Comments: right lateral trunk lean in stance phase  FUNCTIONAL TESTS:  5 times sit to stand: 18.60 sec Timed up and go (TUG): 11.62 sec; TUG cog: 20.75 sec Berg Balance Scale: 43/56 10 meter walk test: 12.22 sec = 2.68 ft/sec  M-CTSIB  Condition 1: Firm Surface, EO 30 Sec, Mild Sway  Condition 2: Firm Surface, EC 30 Sec, Mild and Moderate Sway  Condition 3: Foam Surface, EO 30 Sec, Mild and Moderate Sway  Condition 4: Foam Surface, EC 20 Sec, Moderate and Severe Sway                                                                                                                                  TREATMENT DATE: 03/20/24      GOALS: Goals reviewed with patient? Yes  SHORT TERM  GOALS: Target date: 04/10/2024    Patient will be independent in HEP to improve functional outcomes Baseline: Goal status: IN PROGRESS  2.  Demo improved BLE strength and reduced risk for falls per time 15 sec 5xSTS test Baseline: 18 sec Goal status: IN PROGRESS  3.  Improve safety and access to home environment navigating flight of stairs modified independence Baseline: SBA Goal status: IN PROGRESS  4.  Improve proprioceptive awareness and postural stability for improved safety in ADL by mild sway condition 4 M-CTSIB Baseline: mod-severe x 20 sec Goal status:  IN PROGRESS    LONG TERM GOALS: Target date: 05/01/2024    Demo 10% or less difference TUG test with cognitive dual-task component to improve safety with mobility Baseline: 78% (TUG reg 11.62; TUG cog 20.75 sec) Goal status: INITIAL  2.  Demo low risk for falls per score 52/56 Berg Balance Test Baseline: 43/56 Goal status: INITIAL  3.  Increase gait speed to 3.0 ft/sec to improve efficiency of community ambulation Baseline: 2.68 ft/sec Goal status: INITIAL  4.  Demo independent ambulation community level distances over various surfaces to improve safety in environment Baseline: modified indep level, supervision-SBA uneven Goal status: INITIAL  5.  Independent floor to stand transfers for improved functional mobility Baseline: NT Goal status: INITIAL    ASSESSMENT:  CLINICAL IMPRESSION: Continued with focus on strength development to improve activity tolerance and recruitment. Dyanmic balance activities to facilitate single limb support and safety in loading response. Gait activities to promote improve LLE control and step height for left foot clearance.  Continued sessions to progress POC details  OBJECTIVE IMPAIRMENTS: Abnormal gait, decreased activity tolerance, decreased balance, decreased coordination, decreased endurance, decreased mobility, difficulty walking, decreased ROM, and decreased strength.   ACTIVITY LIMITATIONS: carrying, lifting, bending, squatting, stairs, reach over head, and locomotion level  PARTICIPATION LIMITATIONS: meal prep, cleaning, laundry, interpersonal relationship, driving, shopping, community activity, occupation, and yard work  PERSONAL FACTORS: Age, Time since onset of injury/illness/exacerbation, and 1-2 comorbidities: hx of hydrocephalus, HTN are also affecting patient's functional outcome.   REHAB POTENTIAL: Excellent  CLINICAL DECISION MAKING: Evolving/moderate complexity  EVALUATION COMPLEXITY: Moderate  PLAN:  PT FREQUENCY:  2x/week  PT DURATION: 6 weeks  PLANNED INTERVENTIONS: 97750- Physical Performance Testing, 97110-Therapeutic exercises, 97530- Therapeutic activity, [id number]- Neuromuscular re-education, 97535- Self Care, 40981- Manual therapy, [id number]- Gait training, [phone]- Orthotic Initial, [id number]- Orthotic/Prosthetic subsequent, [phone]- Aquatic Therapy, and DME instructions  PLAN FOR NEXT SESSION: check STG   10:19 AM, 04/07/24 M. Kelly Wyett Narine, PT, DPT Physical Therapist- [place] Office Number: [phone]

## 2024-04-08 ENCOUNTER — Ambulatory Visit (HOSPITAL_COMMUNITY)
Admission: RE | Admit: 2024-04-08 | Discharge: 2024-04-08 | Disposition: A | Discharge status: Home / Self Care | Source: Ambulatory Visit | Attending: Neurosurgery | Admitting: Neurosurgery

## 2024-04-08 DIAGNOSIS — S065XAA Traumatic subdural hemorrhage with loss of consciousness status unknown, initial encounter: Secondary | ICD-10-CM | POA: Diagnosis present

## 2024-04-08 NOTE — Therapy (Signed)
 OUTPATIENT PHYSICAL THERAPY NEURO TREATMENT   Patient Name: Micheal Ayers MRN: [id number] DOB:03/01/1961, 63 y.o., male Today's Date: 04/09/2024   PCP: Olin Bertin, MD REFERRING PROVIDER: Zelda Hickman, PA-C  END OF SESSION:  PT End of Session - 04/09/24 1013     Visit Number 7    Number of Visits 13    Date for PT Re-Evaluation 05/01/24    Authorization Type [employer]    Authorization Time Period 60 visit limit combined    Authorization - Visit Number 6    Authorization - Number of Visits 30    PT Start Time [phone]    PT Stop Time 1012    PT Time Calculation (min) 39 min    Equipment Utilized During Treatment Gait belt    Activity Tolerance Patient tolerated treatment well    Behavior During Therapy WFL for tasks assessed/performed               Past Medical History:  Diagnosis Date   ADD (attention deficit disorder)    Allergic rhinitis, cause unspecified    Chronic kidney disease    Acute renal failure   Hemochromatosis, hereditary (HCC)    seen at [facility]   Hypertension    Major depressive disorder    Stroke [facility])    Past Surgical History:  Procedure Laterality Date   CRANIOTOMY Left 02/24/2024   Procedure: CRANIOTOMY HEMATOMA EVACUATION SUBDURAL;  Surgeon: Joaquin Mulberry, MD;  Location: [facility] OR;  Service: Neurosurgery;  Laterality: Left;   elbow surgury  1969   left elbow   INGUINAL HERNIA REPAIR  2003   left   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/2023   Procedure: Shunt Placment - right occipital;  Surgeon: Agustina Aldrich, MD;  Location: [facility] OR;  Service: Neurosurgery;  Laterality: Right;   Patient Active Problem List   Diagnosis Date Noted   Hyponatremia 03/17/2024   GERD (gastroesophageal reflux disease) 03/17/2024   Anxiety disorder with panic attacks 03/17/2024   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 03/11/2024   Subdural hematoma (HCC) 02/24/2024   S/P craniotomy 02/24/2024   Obstructive hydrocephalus (HCC)  03/13/2023   Ischemic cerebrovascular accident (CVA) (HCC) 02/19/2023   Cerebral ventriculomegaly 02/19/2023   Acute renal failure superimposed on stage 2 chronic kidney disease (HCC) 02/19/2023   Hypertensive urgency 02/19/2023   Abnormal CXR 02/19/2023   CVA (cerebral vascular accident) (HCC) 02/19/2023   Fatigue 09/20/2012   Family history of hemochromatosis 09/20/2012   Preventative health care 09/06/2012   ADD (attention deficit disorder)    Allergic rhinitis     ONSET DATE: 02/19/24  REFERRING DIAG:  J19.147 (ICD-10-CM) - 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 Reports "I could be doing more of them" when asking about HEP. Also requests additional handout for HEP.   Pt accompanied by: self  PERTINENT HISTORY: 63 y.o. male with history of CVA 01/2023, obstructive hydrocephalus status post VP shunt 02/2023, chronic/GAD, panic attacks, low back to 02/24/2024 with 2-day history of right facial droop and difficulty DH with signs of septations and small subacute right parafalcine SDH with midline shift.  He underwent left for evacuation of bleed by Dr. Rochelle Chu.  Postop placed on Decadron  as well as Keppra  for seizure prophylaxis.  Follow-up CT showed resolution of midline shift Decadron  was discharged.  Therapy was working with patient who was noted to have balance deficits with decreasing coordination on  right, difficulty weightbearing through RLE as well as tendency to drift when walking.  He was independent working prior to admission.  CIR was recommended due to functional decline.  PAIN:  Are you having pain? Yes: NPRS scale: 3/10 Pain location: B LEs Pain description: aching, irritating  Aggravating factors:   Relieving factors:    PRECAUTIONS: Other:  Special Instructions: No driving, strenuous activity or return to work till cleared by MD. Recommend repeat check BMET in 1-2 weeks to monitor sodium levels  RED FLAGS: None   WEIGHT BEARING RESTRICTIONS: No  FALLS: Has patient fallen in last 6 months? No  LIVING ENVIRONMENT: Lives with: lives with their family and lives with their spouse Lives in: House/apartment Stairs: Yes: Internal: flight steps; can reach both Has following equipment at home: Otho Blitz - 2 wheeled Master on main floor. PLOF: Independent,   PATIENT GOALS: Improve balance, walking, and mobility  OBJECTIVE:     TODAY'S TREATMENT: 04/09/24 Activity Comments  5xSTS 14.93 sec without UEs   MCTSIB #4  16 sec; L LOB  Stair navigation  Alternating reciprocal pattern with and without rail with good stability   fwd/back stepping over hurdle Cueing to avoid circumduction and control steps back   toe tap on 1st, 2nd step, down Single leg, then set switching for practice shifting wt; cues to slow down and avoid catching toes   side steps over hurdles and onto/off foam Improved stability and better foot clearance   Gait + head turns/nods Slow and some difficulty coordinating;      PATIENT EDUCATION: Education details: edu on progress towards goals and remaining impairments ; provided strategies to avoid losing HEP Person educated: Patient Education method: Explanation, Demonstration, Tactile cues, Verbal cues, and Handouts Education comprehension: verbalized understanding and returned demonstration    HOME EXERCISE PROGRAM Last updated: 04/02/24 Access  Code: [email or web address] URL: [email or web address] Date: 04/02/2024 Prepared by: [facility] - Outpatient  Rehab - [facility]  Exercises - Corner Balance Feet Together With Eyes Closed  - 1 x daily - 7 x weekly - 3 sets - 30 sec hold - Corner Balance Feet Together: Eyes Open With Head Turns  - 1 x daily - 7 x weekly - 3 sets - 30 sec hold - Corner Balance Feet Together: Eyes Closed With Head Turns  - 1 x daily - 7 x weekly - 3 sets - 30 sec hold - Sit to Stand in Stride with AFO and UE Assist in LE Alignment  - 1 x daily - 7 x weekly - 3 sets - 10 reps - Standing Foot Lift on Box (BKA)  - 1 x daily - 7 x  weekly - 2-3 sets - 10 reps     Note: Objective measures were completed at Evaluation unless otherwise noted.  DIAGNOSTIC FINDINGS:  IMPRESSION: 1. Increasing postoperative Pneumocephalus, now affecting both anterior frontal lobes (see series 4, image 17). 2. Subdural drain removed with interval increased residual Left Subdural Hematoma; 12 mm maximal thickness now vs 6-8 mm on 02/25/2022. Small volume para falcine SDH unchanged. 3. No significant midline shift. Stable basilar cisterns. Stable CSF shunt and ventriculomegaly.  COGNITION: Overall cognitive status: History of cognitive impairments - at baseline per pt report since onset of hydrocephalus having increased difficulty with conversation and recall   SENSATION: WFL  COORDINATION: Difficulty with rapid alternating movements on right Finger to nose WNL Heel to shin WNL  EDEMA:  none  MUSCLE TONE: WNL    POSTURE: No Significant postural limitations  LOWER EXTREMITY ROM:     Active  Right Eval Left Eval  Hip flexion    Hip extension    Hip abduction    Hip adduction    Hip internal rotation    Hip external rotation    Knee flexion    Knee extension    Ankle dorsiflexion 8 15  Ankle plantarflexion    Ankle inversion    Ankle eversion     (Blank rows = not tested)  LOWER EXTREMITY MMT:     MMT Right Eval Left Eval  Hip flexion 4 5  Hip extension    Hip abduction 3 5  Hip adduction 4 5  Hip internal rotation    Hip external rotation    Knee flexion 4 5  Knee extension 4 5  Ankle dorsiflexion 3+ 5  Ankle plantarflexion    Ankle inversion    Ankle eversion    (Blank rows = not tested)  BED MOBILITY:  Not tested  TRANSFERS: Independent Floor to stand: NT    CURB:  Findings: CGA  STAIRS: Findings: Level of Assistance: SBA, Number of Stairs: 6, Height of Stairs: 4-6"   , and Comments: BHR GAIT: Findings: Distance walked: 150, Level of assistance: Modified independence, and Comments: right lateral trunk lean in stance phase  FUNCTIONAL TESTS:  5 times sit to stand: 18.60 sec Timed up and go (TUG): 11.62 sec; TUG cog: 20.75 sec Berg Balance Scale: 43/56 10 meter walk test: 12.22 sec = 2.68 ft/sec  M-CTSIB  Condition 1: Firm Surface, EO 30 Sec, Mild Sway  Condition 2: Firm Surface, EC 30 Sec, Mild and Moderate Sway  Condition 3: Foam Surface, EO 30 Sec, Mild and Moderate Sway  Condition 4: Foam Surface, EC 20 Sec, Moderate and Severe Sway                                                                                                                                  TREATMENT DATE: 03/20/24      GOALS: Goals reviewed with patient? Yes  SHORT TERM GOALS: Target date:  04/10/2024    Patient will be independent in HEP to improve functional outcomes Baseline: handout was provided and HEP reviewed 04/09/24 Goal status: MET 04/09/24  2.  Demo improved BLE strength and reduced risk for falls per time 15 sec 5xSTS test Baseline: 18 sec; 14.93 sec without UEs 04/09/24  Goal status: MET 04/09/24   3.  Improve safety and access to home environment navigating flight of stairs modified independence Baseline: SBA; Alternating reciprocal pattern with and without rail with good stability 04/09/24 Goal status: MET 04/09/24  4.  Improve proprioceptive awareness  and postural stability for improved safety in ADL by mild sway condition 4 M-CTSIB Baseline: mod-severe x 20 sec; 16 sec L LOB 04/09/24 Goal status: IN PROGRESS 04/09/24    LONG TERM GOALS: Target date: 05/01/2024    Demo 10% or less difference TUG test with cognitive dual-task component to improve safety with mobility Baseline: 78% (TUG reg 11.62; TUG cog 20.75 sec) Goal status: INITIAL  2.  Demo low risk for falls per score 52/56 Berg Balance Test Baseline: 43/56 Goal status: INITIAL  3.  Increase gait speed to 3.0 ft/sec to improve efficiency of community ambulation Baseline: 2.68 ft/sec Goal status: INITIAL  4.  Demo independent ambulation community level distances over various surfaces to improve safety in environment Baseline: modified indep level, supervision-SBA uneven Goal status: INITIAL  5.  Independent floor to stand transfers for improved functional mobility Baseline: NT Goal status: INITIAL    ASSESSMENT:  CLINICAL IMPRESSION: Patient arrived to session with report of increased LE pain since his stroke. STGs were assessed and revealed good improvement in 5xSTS speed. Still with remaining imbalance with multisensory balance testing. Patient admits to limited HEP compliance and requested additional handout, which was provided. Dynamic balance exercises today focused on foot clearance, compliant surface, and SLS. Patient's SLS is improving, however foot clearance continues to be challenging. Patient tolerated session well and without complaints upon leaving. Progressing well towards goals.   OBJECTIVE IMPAIRMENTS: Abnormal gait, decreased activity tolerance, decreased balance, decreased coordination, decreased endurance, decreased mobility, difficulty walking, decreased ROM, and decreased strength.   ACTIVITY LIMITATIONS: carrying, lifting, bending, squatting, stairs, reach over head, and locomotion level  PARTICIPATION LIMITATIONS: meal prep, cleaning, laundry,  interpersonal relationship, driving, shopping, community activity, occupation, and yard work  PERSONAL FACTORS: Age, Time since onset of injury/illness/exacerbation, and 1-2 comorbidities: hx of hydrocephalus, HTN are also affecting patient's functional outcome.   REHAB POTENTIAL: Excellent  CLINICAL DECISION MAKING: Evolving/moderate complexity  EVALUATION COMPLEXITY: Moderate  PLAN:  PT FREQUENCY: 2x/week  PT DURATION: 6 weeks  PLANNED INTERVENTIONS: 97750- Physical Performance Testing, 97110-Therapeutic exercises, 97530- Therapeutic activity, 97112- Neuromuscular re-education, 97535- Self Care, 16109- Manual therapy, [phone]- Gait training, [phone]- Orthotic Initial, [phone]- Orthotic/Prosthetic subsequent, [phone]- Aquatic Therapy, and DME instructions  PLAN FOR NEXT SESSION: continue working on foot clearance    Thaddeus Filippo, PT, DPT 04/09/24 10:14 AM  [facility] at [facility] [address] [place], [place] [address] Phone # [phone] Fax # [phone]

## 2024-04-09 ENCOUNTER — Ambulatory Visit: Admitting: Physical Therapy

## 2024-04-09 ENCOUNTER — Ambulatory Visit: Admitting: Occupational Therapy

## 2024-04-09 ENCOUNTER — Encounter: Payer: Self-pay | Admitting: Physical Therapy

## 2024-04-09 DIAGNOSIS — R2689 Other abnormalities of gait and mobility: Secondary | ICD-10-CM

## 2024-04-09 DIAGNOSIS — R278 Other lack of coordination: Secondary | ICD-10-CM

## 2024-04-09 DIAGNOSIS — M6281 Muscle weakness (generalized): Secondary | ICD-10-CM

## 2024-04-09 DIAGNOSIS — R2681 Unsteadiness on feet: Secondary | ICD-10-CM

## 2024-04-09 DIAGNOSIS — R262 Difficulty in walking, not elsewhere classified: Secondary | ICD-10-CM

## 2024-04-09 DIAGNOSIS — I69119 Unspecified symptoms and signs involving cognitive functions following nontraumatic intracerebral hemorrhage: Secondary | ICD-10-CM

## 2024-04-09 DIAGNOSIS — I69118 Other symptoms and signs involving cognitive functions following nontraumatic intracerebral hemorrhage: Secondary | ICD-10-CM

## 2024-04-09 NOTE — Patient Instructions (Addendum)
    Keeping Thinking Skills Sharp: 1. Jigsaw puzzles 2. Card/board games 3. Talking on the phone/social events 4. [email or web address] 5. Online games 6. Word searches/crossword puzzles 7.  Logic puzzles 8. Aerobic exercise (stationary bike) 9. Eating balanced diet (fruits & veggies) 10. Drink water 11. Try something new--new recipe, hobby 12. Crafts 13. Do a variety of activities that are challenging 14.  Plan weekly meals and write a grocery list 15. Add cognitive activities to walking/exercising (think of animal/food/city with each letter of the alphabet, counting backwards, thinking of as many vegetables as you can, etc.).--Only do this  If safe (no freezing/falls)

## 2024-04-09 NOTE — Therapy (Signed)
 OUTPATIENT OCCUPATIONAL THERAPY NEURO  Treatment Note  Patient Name: Micheal Ayers MRN: [id number] DOB:May 17, 1961, 63 y.o., male Today's Date: 04/09/2024  PCP: Olin Bertin, MD REFERRING PROVIDER: Zelda Hickman, PA-C  END OF SESSION:  OT End of Session - 04/09/24 1018     Visit Number 7    Number of Visits 13    Date for OT Re-Evaluation 05/02/24    Authorization Type [employer] - 60 visits combined (OT/PT/ST)    OT Start Time 1017    OT Stop Time 1100    OT Time Calculation (min) 43 min                   Past Medical History:  Diagnosis Date   ADD (attention deficit disorder)    Allergic rhinitis, cause unspecified    Chronic kidney disease    Acute renal failure   Hemochromatosis, hereditary (HCC)    seen at [facility]   Hypertension    Major depressive disorder    Stroke [facility])    Past Surgical History:  Procedure Laterality Date   CRANIOTOMY Left 02/24/2024   Procedure: CRANIOTOMY HEMATOMA EVACUATION SUBDURAL;  Surgeon: Joaquin Mulberry, MD;  Location: [facility] OR;  Service: Neurosurgery;  Laterality: Left;   elbow surgury  1969   left elbow   INGUINAL HERNIA REPAIR  2003   left   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/2023   Procedure: Shunt Placment - right occipital;  Surgeon: Agustina Aldrich, MD;  Location: [facility] OR;  Service: Neurosurgery;  Laterality: Right;   Patient Active Problem List   Diagnosis Date Noted   Hyponatremia 03/17/2024   GERD (gastroesophageal reflux disease) 03/17/2024   Anxiety disorder with panic attacks 03/17/2024   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 03/11/2024   Subdural hematoma (HCC) 02/24/2024   S/P craniotomy 02/24/2024   Obstructive hydrocephalus (HCC) 03/13/2023   Ischemic cerebrovascular accident (CVA) (HCC) 02/19/2023   Cerebral ventriculomegaly 02/19/2023   Acute renal failure superimposed on stage 2 chronic kidney disease (HCC) 02/19/2023   Hypertensive urgency 02/19/2023   Abnormal CXR  02/19/2023   CVA (cerebral vascular accident) (HCC) 02/19/2023   Fatigue 09/20/2012   Family history of hemochromatosis 09/20/2012   Preventative health care 09/06/2012   ADD (attention deficit disorder)    Allergic rhinitis     ONSET DATE: 02/24/24  REFERRING DIAG: W09.811 (ICD-10-CM) - 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 Gwendlyn Lemmings today.    Pt reports that he forgot to do the RTW checklist.  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 He was found to have massive left SDH with signs of septations and small subacute right parafalcine SDH with midline shift. He was evaluated by Dr. Rochelle Chu and underwent left fronto-temporoparietal crani for evacuation of large chronic LDH the same day. He was placed on decadron  and on Keppra  for seizure prophylaxis and serial CT head showed increaseing post op pneumocephalus with residual L-SDH decreased to 6-8 mm from 12 mm and resolution of medline shift.  PRECAUTIONS: None  WEIGHT BEARING RESTRICTIONS: No  PAIN:  Are you having pain? Yes: NPRS scale: 2/10 Pain location: lower body, BLE Pain description: irritating Aggravating factors: unknown Relieving factors: unknown  FALLS: Has patient fallen in last 6 months? 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  HAND DOMINANCE: Right  ADLs: Overall ADLs: Mod I - reports more time consuming overall, mild difficulty with getting in/out of shower but no physical assistance Transfers/ambulation related to ADLs: Mod I - using RW 50% of time in the home and when walking in the cul-de-sac Equipment: Grab bars, Walk in shower, and hand held shower head  IADLs: Light housekeeping: is not currently performing and wife did majority even prior Meal Prep: is not currently performing and wife did majority even prior Community mobility: not currently cleared to drive  Medication management: spouse is assisting pt with taking meds appropriately Financial management: spouse is managing bills at this time Handwriting: 100% legible completed PPT #1([place]): 11.66 sec  MOBILITY STATUS: slower and decreased endurance  POSTURE COMMENTS:  No Significant postural limitations  ACTIVITY TOLERANCE: Activity tolerance: diminished  FUNCTIONAL OUTCOME MEASURES: PSFS: 3.0   UPPER EXTREMITY ROM:  WFL bilaterally  UPPER EXTREMITY MMT:   grossly 4/5 bilaterally  COORDINATION: Finger Nose Finger test: mild dysmetria on R and mildly slower compared to L 9 Hole Peg test: Right: 24.53 sec; Left: 29.06 sec Box and Blocks:  Right 45 blocks, Left 43 blocks - frequently picking up 2 blocks but able to recall to only pick up one at a time RAM: pt with decreased motor control of RUE  with rapid hand movements, demonstrating "tremor-like" movement on R  SENSATION: WFL  COGNITION: Overall cognitive status: No family/caregiver present to determine baseline cognitive functioning Brief Interview for Mental Status:   Memory:  Word Repetition:   "Sock"  "Blue"  "Bed"   Orientation Level:    Year:   2025  Month: April  Day of Week: Thursday  Memory:  Recall: Able to recall Blue and bed, unable to recall sock even when given a cue.      VISION: Subjective report: "I can't see far away" no changes Baseline vision: Wears glasses for distance only   VISION ASSESSMENT: Ocular ROM: WFL Gaze preference/alignment: WDL Tracking/Visual pursuits: Able to track stimulus in all quads without difficulty Saccades: WFL Visual Fields: no apparent deficits  OBSERVATIONS: Pt requiring increased time to answer questions and somewhat tangential when asked questions about pt impairments, possibly demonstrating decreased awareness vs decreased memory.  Pt requiring increased time with 9 hole peg test and Box and blocks, most likely due to overall slowed processing as pt reports "everything is a process".  TREATMENT DATE:  04/09/24 Medication management: pt reports placing AM pills on one side of sink and PM on the other.  Pt reports one requires 2 pills at dose and he has this one set in front to prompt that it is different than the remaining.  He reports that with his spouse out of town he is having to manage meds on his own, but did state that she texted him to ensure that he took his meds.  OT encouraged pt to have spouse remind afterwards or allow pt to text her after he has taken his meds to allow for increased recall and memory. Energy conservation: educated on 4P's of energy conservation (planning, prioritizing, pacing, and positioning) and providing cues and  examples for each.  OT also educating on increased activity tolerance with home making tasks to progress towards eventual return to work.  Educated on standing rest breaks vs seated rest break and smaller portions of tasks to allow for increased completion without "overdoing it". Keeping thinking skills sharp: again reiterating importance of engagement in familiar, routine tasks as well as various puzzles, games, etc to further challenge thinking skills.  OT provided with handout and additional suggestions based on pt leisure preferences. Mutli-tasking: engaged in ambulation while tossing ball and engaging in mental math and naming of items.  Initiated task with standing and tossing ball back and forth with therapist while naming food alphabetically.  OT then increased challenge to pt ambulating while tossing ball to himself and completing mental math.  Pt much quicker with mental math, despite increased motor challenge compared to categorical naming.      04/07/24 Pill box assessment: Pt completed pill box test with 100% accuracy, in 5:34 minutes.  Pt demonstrates good multi-tasking and probelm solving throughout task. Pt reports that his wife will dispense his meds for him. He is aware of the routine and amounts that he takes at each time.  Pt stating that he thinks she is concerned he may forget to take his meds or take them incorrectly so she does it for him at this point.  Pt also recognizes that she provides assistance in caring for her mother, so she may just be acting as caregiver for him as well.  Engaged in discussion about strategies to increase independence with reminders for meds, use of pill box, and/or routines to increase recall and completion of taking meds. Pt reports that he feels his largest difficulties are the "mental challenges" that started when in the hospital, as he felt so limited due to safety concerns and restrictions with use of call bell and bed alarm for safety. Pt reports now  that he has returned to driving and is completing more activities at home that he is feeling more sense of independence returning. Return to work: pt concerned with stamina in regards to return to work.  Pt reports that his job will be moving to a new location which will decrease his need to go up/down a ladder.  OT provided pt with RTW handout from [employer] and encouraged him to fill it out, discuss with wife, and identify potential areas of concern both with home, routine tasks as well as in regards to return to work.    04/04/24 Alternating attention: engaged in small peg board pattern replication with use of key to identify correlating colors, challenging alternating attention, visual perception, and sequencing. Pt demonstrating good coordination and manipulation of pegs in-hand to place in peg board.  Pt completing in horizontal scanning  with no omission of pegs or errors.  OT reiterating education on sequencing and maintaining routine to increase recall.   Memory: engaged in completion of additional peg board replication with focus on recall of pattern and recall of items named.  Pt able to recall 2 of 5 categories, requiring cues and/or use of written categories for increased recall.  Pt with no repeating of items during naming, however completing with all items in same category before progressing to next category.  OT providing question cues to aid in word finding when identifying "place" providing cue of "where might someone go?" Cognitive/motor dual tasking: engaged in standing activity while reaching for colored clothespins and placing onto vertical dowel while challenging cognition, again with naming items based on category.  OT challenging pt to alternate colors, therefore requiring increased recall of categories and named items.  Pt demonstrating good ability to complete through 3 cycles of 3 categories, but then demonstrating decreased recall of categories as well as items  previously named.     PATIENT EDUCATION: Education details: ongoing condition specific education, see above for education on memory strategies and sequencing as needed for meds and return to work. Person educated: Patient Education method: Explanation and Handouts Education comprehension: needs further education  HOME EXERCISE PROGRAM: 03/25/24 - memory strategies and coordination (see pt instructions)   GOALS: Goals reviewed with patient? Yes  SHORT TERM GOALS: Target date: 04/11/24  Pt will be independent with fine and gross motor control HEP with use of visual handouts. Baseline: slower overall with fine and gross motor tasks Goal status: in progress  2.  Pt will verbalize understanding of task modifications and/or potential AE needs to increase ease, safety, and independence w/ ADLs and IADLs. Baseline: limited engagement in IADLs, slower overall with ADLs Goal status: MET  3.  Pt will demonstrate improved memory, sequencing, and problem solving as demonstrated by improvements in score on pill box assessment by ability to complete in 5 mins time limit with </= to 2 errors.  Baseline: wife managing meds/ TBD 03/25/24 - no errors, but required 6:39 to complete 04/07/24 - no errors, completed in 5:34 Goal status: Partially met  4.  Pt will verbalize understanding of energy conservation strategies.  Baseline: decreased endurance  Goal status: in progress - initiated on 04/09/24   LONG TERM GOALS: Target date: 05/02/24  Pt will demonstrate understanding of memory compensations and ways to keep thinking skills sharp Baseline:  Goal status:  in progress  2.  Pt will perform dynamic standing task for 10 mins as needed to complete simple IADLs w/o LOB using DME and/or countertop support prn. Baseline: decreased engagement in IADLs Goal status:  in progress  3.  Pt will demonstrate ability to sequence simple functional task (simple snack prep, laundry task, etc) at Mod I level with  good safety awareness and ability to utilize memory strategies for recall.  Baseline: decreased engagement in IADLs Goal status:  in progress  4.  Pt will navigate a moderately busy environment, completing dual task activity and/or following multi-step commands with 90% accuracy Baseline: impaired memory and thinking strategies and endurance impacting return to work Goal status:  in progress  5.  Pt will be independent with utilization of memory strategies to allow for increased independence with managing personal appointments and in preparation for return to work. Baseline: decreased memory and thinking strategies Goal status:  in progress  6.  Patient will report at least two-point increase in average PSFS score or at least  three-point increase in a single activity score indicating functionally significant improvement given minimum detectable change. Baseline: 3.0 Goal status:  in progress  ASSESSMENT:  CLINICAL IMPRESSION: Pt continues to require min increased time for sequencing with categorical naming this session.  Pt demonstrating good mental math during cognitive/motor dual tasking activity.  Pt has begun to return to home making tasks, as spouse is out of town for the next few days therefore pt having no one else to complete these tasks.  Pt with good awareness and endurance during simple meal prep for lunch and breakfast this AM and managing meds with reminder from spouse.  Pt will continue to benefit from focus on memory, especially short term memory and establishing a process or routine to increase recall.  Pt will continue benefit from skilled occupational therapy services to address coordination, pain management, balance, GM/FM control, cognition, safety awareness, introduction of compensatory strategies/AE prn, visual-perception, and implementation of an HEP to improve participation and safety during ADLs and IADLs.     PERFORMANCE DEFICITS: in functional skills including ADLs,  IADLs, coordination, pain, Fine motor control, Gross motor control, balance, body mechanics, endurance, decreased knowledge of precautions, and UE functional use, cognitive skills including emotional, energy/drive, memory, safety awareness, and thought, and psychosocial skills including coping strategies and routines and behaviors.     PLAN:  OT FREQUENCY: 2x/week  OT DURATION: 6 weeks  PLANNED INTERVENTIONS: 97168 OT Re-evaluation, 97535 self care/ADL training, 16109 therapeutic exercise, 97530 therapeutic activity, 97112 neuromuscular re-education, functional mobility training, visual/perceptual remediation/compensation, psychosocial skills training, energy conservation, coping strategies training, patient/family education, and DME and/or AE instructions  RECOMMENDED OTHER SERVICES: may benefit from SLP, TBD after additional session and assessment of SLUMS  CONSULTED AND AGREED WITH PLAN OF CARE: Patient  PLAN FOR NEXT SESSION:   Review RTW checklist  Start with coordination HEP and work on [employer] activities and recall strategies.  Cognitive/motor dual tasking while naming items in category with mobility/ball toss   Anthonette Kinsman, OTR/L 04/09/2024, 10:19 AM   [facility] [address] [place], [address] [place], [place] [address] Phone # [phone] Fax # [phone]

## 2024-04-14 ENCOUNTER — Ambulatory Visit

## 2024-04-14 ENCOUNTER — Ambulatory Visit: Admitting: Occupational Therapy

## 2024-04-14 DIAGNOSIS — M6281 Muscle weakness (generalized): Secondary | ICD-10-CM

## 2024-04-14 DIAGNOSIS — R2689 Other abnormalities of gait and mobility: Secondary | ICD-10-CM

## 2024-04-14 DIAGNOSIS — I69118 Other symptoms and signs involving cognitive functions following nontraumatic intracerebral hemorrhage: Secondary | ICD-10-CM

## 2024-04-14 DIAGNOSIS — R262 Difficulty in walking, not elsewhere classified: Secondary | ICD-10-CM

## 2024-04-14 DIAGNOSIS — R278 Other lack of coordination: Secondary | ICD-10-CM | POA: Diagnosis not present

## 2024-04-14 DIAGNOSIS — R2681 Unsteadiness on feet: Secondary | ICD-10-CM

## 2024-04-14 NOTE — Therapy (Signed)
 OUTPATIENT OCCUPATIONAL THERAPY NEURO  Treatment Note  Patient Name: Micheal Ayers MRN: [id number] DOB:05/06/1961, 63 y.o., male Today's Date: 04/14/2024  PCP: Olin Bertin, MD REFERRING PROVIDER: Zelda Hickman, PA-C  END OF SESSION:  OT End of Session - 04/14/24 1242     Visit Number 8    Number of Visits 13    Date for OT Re-Evaluation 05/02/24    Authorization Type [employer] - 60 visits combined (OT/PT/ST)    OT Start Time 1103    OT Stop Time 1148    OT Time Calculation (min) 45 min                    Past Medical History:  Diagnosis Date   ADD (attention deficit disorder)    Allergic rhinitis, cause unspecified    Chronic kidney disease    Acute renal failure   Hemochromatosis, hereditary (HCC)    seen at [facility]   Hypertension    Major depressive disorder    Stroke [facility])    Past Surgical History:  Procedure Laterality Date   CRANIOTOMY Left 02/24/2024   Procedure: CRANIOTOMY HEMATOMA EVACUATION SUBDURAL;  Surgeon: Joaquin Mulberry, MD;  Location: [facility] OR;  Service: Neurosurgery;  Laterality: Left;   elbow surgury  1969   left elbow   INGUINAL HERNIA REPAIR  2003   left   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/2023   Procedure: Shunt Placment - right occipital;  Surgeon: Agustina Aldrich, MD;  Location: [facility] OR;  Service: Neurosurgery;  Laterality: Right;   Patient Active Problem List   Diagnosis Date Noted   Hyponatremia 03/17/2024   GERD (gastroesophageal reflux disease) 03/17/2024   Anxiety disorder with panic attacks 03/17/2024   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 03/11/2024   Subdural hematoma (HCC) 02/24/2024   S/P craniotomy 02/24/2024   Obstructive hydrocephalus (HCC) 03/13/2023   Ischemic cerebrovascular accident (CVA) (HCC) 02/19/2023   Cerebral ventriculomegaly 02/19/2023   Acute renal failure superimposed on stage 2 chronic kidney disease (HCC) 02/19/2023   Hypertensive urgency 02/19/2023   Abnormal CXR  02/19/2023   CVA (cerebral vascular accident) (HCC) 02/19/2023   Fatigue 09/20/2012   Family history of hemochromatosis 09/20/2012   Preventative health care 09/06/2012   ADD (attention deficit disorder)    Allergic rhinitis     ONSET DATE: 02/24/24  REFERRING DIAG: J47.829 (ICD-10-CM) - 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  Pt reports a lot of the guys he played cards with had seen him when he was in the hospital.    F/u with Dr. Gwendlyn Lemmings 5/14 "he cleared me of all restrictions." Plan to f/u with another CT scan in July.    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 He was found to have massive left SDH with signs of septations and small subacute right parafalcine SDH with midline shift. He was evaluated by Dr. Rochelle Chu and underwent left fronto-temporoparietal crani for evacuation of large chronic LDH the same day. He was placed on decadron  and on Keppra  for seizure prophylaxis  and serial CT head showed increaseing post op pneumocephalus with residual L-SDH decreased to 6-8 mm from 12 mm and resolution of medline shift.  PRECAUTIONS: None  WEIGHT BEARING RESTRICTIONS: No  PAIN:  Are you having pain? Yes: NPRS scale: 2/10 Pain location: lower body, BLE Pain description: nagging, irritating Aggravating factors: unknown Relieving factors: unknown  FALLS: Has patient fallen in last 6 months? 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  HAND DOMINANCE: Right  ADLs: Overall ADLs: Mod I - reports more time consuming overall, mild difficulty with getting in/out of shower but no physical assistance Transfers/ambulation related to ADLs: Mod I - using RW 50% of time in the home and when walking in the cul-de-sac Equipment: Grab bars, Walk in shower, and hand held shower head  IADLs: Light housekeeping: is not currently performing and wife did majority even prior Meal Prep: is not currently performing and wife did majority even prior Community mobility: not currently cleared to drive  Medication management: spouse is assisting pt with taking meds appropriately Financial management: spouse is managing bills at this time Handwriting: 100% legible completed PPT #1([place]): 11.66 sec  MOBILITY STATUS: slower and decreased endurance  POSTURE COMMENTS:  No Significant postural limitations  ACTIVITY TOLERANCE: Activity tolerance: diminished  FUNCTIONAL OUTCOME MEASURES: PSFS: 3.0   UPPER EXTREMITY ROM:  WFL bilaterally  UPPER EXTREMITY MMT:   grossly 4/5 bilaterally  COORDINATION: Finger Nose Finger test: mild dysmetria on R and mildly slower compared to L 9 Hole Peg test: Right: 24.53 sec; Left: 29.06 sec Box and Blocks:  Right 45 blocks, Left 43 blocks - frequently picking up 2 blocks but able  to recall to only pick up one at a time RAM: pt with decreased motor control of RUE with rapid hand movements, demonstrating "tremor-like" movement on R  SENSATION: WFL  COGNITION: Overall cognitive status: No family/caregiver present to determine baseline cognitive functioning Brief Interview for Mental Status:   Memory:  Word Repetition:   "Sock"  "Blue"  "Bed"   Orientation Level:    Year:   2025  Month: April  Day of Week: Thursday  Memory:  Recall: Able to recall Blue and bed, unable to recall sock even when given a cue.      VISION: Subjective report: "I can't see far away" no changes Baseline vision: Wears glasses for distance only   VISION ASSESSMENT: Ocular ROM: WFL Gaze preference/alignment: WDL Tracking/Visual pursuits: Able to track stimulus in all quads without difficulty Saccades: WFL Visual Fields: no apparent deficits  OBSERVATIONS: Pt requiring increased time to answer questions and somewhat tangential when asked questions about pt impairments, possibly demonstrating decreased awareness vs decreased memory.  Pt requiring increased time with 9 hole peg test and Box and blocks, most likely due to overall slowed processing as pt reports "everything is a process".  TREATMENT DATE:  04/14/24 Reviewed RTW checklist: Pt scoring 9/17 in low barriers to employment and 8/7 in moderate barriers to employment (with areas of concern being Sleep, daytime fatigue, thinking skills, emotional well-being, attending appts, planning and decision making, rehab therapies, up to date skills and education).  OT providing education on each area with focus on establishing and routine and schedule to allow for consistent sleep.  Pt reports that he finds that he is going to bed earlier and sleeping 9-10 hours at night, still feeling tired.  Educated on increased activity  and routine for improved overnight sleep as well as improved sleep hygiene.    Cognitive/motor dual tasking: engaged in ambulating around therapy gym to locate colored cones challenging reach and balance while naming items according to color.  Pt with ability to recall one of three categories when given a cue to associate color with category.  Pt continues to require increased time with recall. Memory: educated on "WARM" strategy, focusing on association and repeating.  OT encouraged pt to focus on identification, associate it, to increase recall and then allow focus on more challenging tasks/categories.  Educated on functional carryover to work and routine tasks.    04/09/24 Medication management: pt reports placing AM pills on one side of sink and PM on the other.  Pt reports one requires 2 pills at dose and he has this one set in front to prompt that it is different than the remaining.  He reports that with his spouse out of town he is having to manage meds on his own, but did state that she texted him to ensure that he took his meds.  OT encouraged pt to have spouse remind afterwards or allow pt to text her after he has taken his meds to allow for increased recall and memory. Energy conservation: educated on 4P's of energy conservation (planning, prioritizing, pacing, and positioning) and providing cues and examples for each.  OT also educating on increased activity tolerance with home making tasks to progress towards eventual return to work.  Educated on standing rest breaks vs seated rest break and smaller portions of tasks to allow for increased completion without "overdoing it". Keeping thinking skills sharp: again reiterating importance of engagement in familiar, routine tasks as well as various puzzles, games, etc to further challenge thinking skills.  OT provided with handout and additional suggestions based on pt leisure preferences. Mutli-tasking: engaged in ambulation while tossing ball and  engaging in mental math and naming of items.  Initiated task with standing and tossing ball back and forth with therapist while naming food alphabetically.  OT then increased challenge to pt ambulating while tossing ball to himself and completing mental math.  Pt much quicker with mental math, despite increased motor challenge compared to categorical naming.      04/07/24 Pill box assessment: Pt completed pill box test with 100% accuracy, in 5:34 minutes.  Pt demonstrates good multi-tasking and probelm solving throughout task. Pt reports that his wife will dispense his meds for him. He is aware of the routine and amounts that he takes at each time.  Pt stating that he thinks she is concerned he may forget to take his meds or take them incorrectly so she does it for him at this point.  Pt also recognizes that she provides assistance in caring for her mother, so she may just be acting as caregiver for him as well.  Engaged in discussion about strategies to increase independence with reminders for meds, use  of pill box, and/or routines to increase recall and completion of taking meds. Pt reports that he feels his largest difficulties are the "mental challenges" that started when in the hospital, as he felt so limited due to safety concerns and restrictions with use of call bell and bed alarm for safety. Pt reports now that he has returned to driving and is completing more activities at home that he is feeling more sense of independence returning. Return to work: pt concerned with stamina in regards to return to work.  Pt reports that his job will be moving to a new location which will decrease his need to go up/down a ladder.  OT provided pt with RTW handout from [employer] and encouraged him to fill it out, discuss with wife, and identify potential areas of concern both with home, routine tasks as well as in regards to return to work.    PATIENT EDUCATION: Education details: ongoing  condition specific education, see above for education on memory strategies and sequencing as needed for return to work. Person educated: Patient Education method: Explanation and Handouts Education comprehension: needs further education  HOME EXERCISE PROGRAM: 03/25/24 - memory strategies and coordination (see pt instructions)   GOALS: Goals reviewed with patient? Yes  SHORT TERM GOALS: Target date: 04/11/24  Pt will be independent with fine and gross motor control HEP with use of visual handouts. Baseline: slower overall with fine and gross motor tasks Goal status: in progress  2.  Pt will verbalize understanding of task modifications and/or potential AE needs to increase ease, safety, and independence w/ ADLs and IADLs. Baseline: limited engagement in IADLs, slower overall with ADLs Goal status: MET  3.  Pt will demonstrate improved memory, sequencing, and problem solving as demonstrated by improvements in score on pill box assessment by ability to complete in 5 mins time limit with </= to 2 errors.  Baseline: wife managing meds/ TBD 03/25/24 - no errors, but required 6:39 to complete 04/07/24 - no errors, completed in 5:34 Goal status: Partially met  4.  Pt will verbalize understanding of energy conservation strategies.  Baseline: decreased endurance  Goal status: in progress - initiated on 04/09/24   LONG TERM GOALS: Target date: 05/02/24  Pt will demonstrate understanding of memory compensations and ways to keep thinking skills sharp Baseline:  Goal status:  in progress  2.  Pt will perform dynamic standing task for 10 mins as needed to complete simple IADLs w/o LOB using DME and/or countertop support prn. Baseline: decreased engagement in IADLs Goal status:  in progress  3.  Pt will demonstrate ability to sequence simple functional task (simple snack prep, laundry task, etc) at Mod I level with good safety awareness and ability to utilize memory strategies for  recall.  Baseline: decreased engagement in IADLs Goal status:  in progress  4.  Pt will navigate a moderately busy environment, completing dual task activity and/or following multi-step commands with 90% accuracy Baseline: impaired memory and thinking strategies and endurance impacting return to work Goal status:  in progress  5.  Pt will be independent with utilization of memory strategies to allow for increased independence with managing personal appointments and in preparation for return to work. Baseline: decreased memory and thinking strategies Goal status:  in progress  6.  Patient will report at least two-point increase in average PSFS score or at least three-point increase in a single activity score indicating functionally significant improvement given minimum detectable change. Baseline: 3.0 Goal status:  in progress  ASSESSMENT:  CLINICAL IMPRESSION: Pt continues to require min increased time for sequencing with categorical naming, not impacted by mobility.  Pt demonstrating good awareness of impairments as they pertain to return to work.  OT encouraged pt to share potential concerns with employer as they begin to discuss return to work appropriateness.  Pt will continue to benefit from focus on memory, especially short term memory and establishing a process or routine to increase recall.     PERFORMANCE DEFICITS: in functional skills including ADLs, IADLs, coordination, pain, Fine motor control, Gross motor control, balance, body mechanics, endurance, decreased knowledge of precautions, and UE functional use, cognitive skills including emotional, energy/drive, memory, safety awareness, and thought, and psychosocial skills including coping strategies and routines and behaviors.     PLAN:  OT FREQUENCY: 2x/week  OT DURATION: 6 weeks  PLANNED INTERVENTIONS: 97168 OT Re-evaluation, 97535 self care/ADL training, 40981 therapeutic exercise, 97530 therapeutic activity, 97112  neuromuscular re-education, functional mobility training, visual/perceptual remediation/compensation, psychosocial skills training, energy conservation, coping strategies training, patient/family education, and DME and/or AE instructions  RECOMMENDED OTHER SERVICES: may benefit from SLP, TBD after additional session and assessment of SLUMS  CONSULTED AND AGREED WITH PLAN OF CARE: Patient  PLAN FOR NEXT SESSION:    Start with coordination HEP and work on [employer] activities and recall strategies.  Cognitive/motor dual tasking while naming items in category with mobility/ball toss, incorporate picking up/carrying heavy items?   Anthonette Kinsman, OTR/L 04/14/2024, 12:42 PM   [facility] [address] [place], [address] [place], [place] [address] Phone # [phone] Fax # [phone]

## 2024-04-14 NOTE — Therapy (Signed)
 OUTPATIENT PHYSICAL THERAPY NEURO TREATMENT   Patient Name: Micheal Ayers MRN: [id number] DOB:[birth year], 63 y.o., male Today's Date: 04/14/2024   PCP: Olin Bertin, MD REFERRING PROVIDER: Zelda Hickman, PA-C  END OF SESSION:  PT End of Session - 04/14/24 0947     Visit Number 8    Number of Visits 13    Date for PT Re-Evaluation 05/01/24    Authorization Type [employer]    Authorization Time Period 60 visit limit combined    Authorization - Visit Number 7    Authorization - Number of Visits 30    PT Start Time 0945    PT Stop Time 1045    PT Time Calculation (min) 60 min    Equipment Utilized During Treatment Gait belt    Activity Tolerance Patient tolerated treatment well    Behavior During Therapy WFL for tasks assessed/performed               Past Medical History:  Diagnosis Date   ADD (attention deficit disorder)    Allergic rhinitis, cause unspecified    Chronic kidney disease    Acute renal failure   Hemochromatosis, hereditary (HCC)    seen at [facility]   Hypertension    Major depressive disorder    Stroke [facility])    Past Surgical History:  Procedure Laterality Date   CRANIOTOMY Left 02/24/2024   Procedure: CRANIOTOMY HEMATOMA EVACUATION SUBDURAL;  Surgeon: Joaquin Mulberry, MD;  Location: [facility] OR;  Service: Neurosurgery;  Laterality: Left;   elbow surgury  1969   left elbow   INGUINAL HERNIA REPAIR  2003   left   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/2023   Procedure: Shunt Placment - right occipital;  Surgeon: Agustina Aldrich, MD;  Location: [facility] OR;  Service: Neurosurgery;  Laterality: Right;   Patient Active Problem List   Diagnosis Date Noted   Hyponatremia 03/17/2024   GERD (gastroesophageal reflux disease) 03/17/2024   Anxiety disorder with panic attacks 03/17/2024   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 03/11/2024   Subdural hematoma (HCC) 02/24/2024   S/P craniotomy 02/24/2024   Obstructive hydrocephalus (HCC)  03/13/2023   Ischemic cerebrovascular accident (CVA) (HCC) 02/19/2023   Cerebral ventriculomegaly 02/19/2023   Acute renal failure superimposed on stage 2 chronic kidney disease (HCC) 02/19/2023   Hypertensive urgency 02/19/2023   Abnormal CXR 02/19/2023   CVA (cerebral vascular accident) (HCC) 02/19/2023   Fatigue 09/20/2012   Family history of hemochromatosis 09/20/2012   Preventative health care 09/06/2012   ADD (attention deficit disorder)    Allergic rhinitis     ONSET DATE: 02/19/24  REFERRING DIAG:  W09.811 (ICD-10-CM) - 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  Pt accompanied by: self  PERTINENT HISTORY: 63 y.o. male with history of CVA 01/2023, obstructive hydrocephalus status post VP shunt 02/2023, chronic/GAD, panic attacks, low back to 02/24/2024 with 2-day history of right facial droop and difficulty DH with signs of septations and small subacute right parafalcine SDH with midline shift.  He underwent left for evacuation of bleed by Dr. Rochelle Chu.  Postop placed on Decadron  as well as Keppra  for seizure prophylaxis.  Follow-up CT showed resolution of midline shift Decadron  was discharged.  Therapy was working with patient who was noted to have balance deficits with decreasing coordination on right, difficulty weightbearing through RLE as well as tendency to drift when  walking.  He was independent working prior to admission.  CIR was recommended due to functional decline.  PAIN:  Are you having pain? Yes: NPRS scale: 3/10 Pain location: B LEs Pain description: aching, irritating  Aggravating factors:   Relieving factors:    PRECAUTIONS: Other:  Special Instructions: No driving, strenuous activity or return to work till cleared by MD. Recommend repeat check BMET in 1-2 weeks to monitor sodium levels  RED FLAGS: None   WEIGHT BEARING RESTRICTIONS: No  FALLS: Has patient fallen in last 6 months? No  LIVING ENVIRONMENT: Lives with: lives with their family and lives with their spouse Lives in: House/apartment Stairs: Yes: Internal: flight steps; can reach both Has following equipment at home: Otho Blitz - 2 wheeled Master on main floor. PLOF: Independent,   PATIENT GOALS: Improve balance, walking, and mobility  OBJECTIVE:   TODAY'S TREATMENT: 04/14/24 Activity Comments  LAQ 3x12 10#  Alt stair taps 3x12 10#,  8" box  Sidestepping x 2 min 10#, along counter  STS 1x10, 20" seat height -15# KB  -25# KB  Deadlift 2x10 -25# KB --cues in hip hinge for lowering  Suitcase carry x 2 min -25# KB  Forward step-up 3x10 -8" step, no HR  Lateral step-up 2x10 -8" step w/ HR  RPE 4-5/10, 116 bpm   Fast walk forward/large step backwards x 2 min CGA       TODAY'S TREATMENT: 04/09/24 Activity Comments  5xSTS 14.93 sec without UEs   MCTSIB #4  16 sec; L LOB  Stair navigation  Alternating reciprocal pattern with and without rail with good stability   fwd/back stepping over hurdle Cueing to avoid circumduction and control steps back   toe tap on 1st, 2nd step, down Single leg, then set switching for practice shifting wt; cues to slow down and avoid catching toes   side steps over hurdles and onto/off foam Improved stability and better foot clearance   Gait + head turns/nods Slow and some difficulty coordinating;      PATIENT EDUCATION: Education  details: edu on progress towards goals and remaining impairments ; provided strategies to avoid losing HEP Person educated: Patient Education method: Explanation, Demonstration, Tactile cues, Verbal cues, and Handouts Education comprehension: verbalized understanding and returned demonstration    HOME EXERCISE PROGRAM Last updated: 04/02/24 Access Code: [email or web address] URL: [email or web address] Date: 04/02/2024 Prepared by: [facility]  Exercises - Corner Balance Feet Together With Eyes Closed  - 1 x daily - 7 x weekly - 3 sets - 30 sec hold - Corner Balance Feet Together: Eyes Open With Head Turns  - 1 x daily - 7 x weekly - 3 sets - 30 sec hold - Corner Balance Feet Together: Eyes Closed With Head Turns  -  1 x daily - 7 x weekly - 3 sets - 30 sec hold - Sit to Stand in Stride with AFO and UE Assist in LE Alignment  - 1 x daily - 7 x weekly - 3 sets - 10 reps - Standing Foot Lift on Box (BKA)  - 1 x daily - 7 x weekly - 2-3 sets - 10 reps     Note: Objective measures were completed at Evaluation unless otherwise noted.  DIAGNOSTIC FINDINGS:  IMPRESSION: 1. Increasing postoperative Pneumocephalus, now affecting both anterior frontal lobes (see series 4, image 17). 2. Subdural drain removed with interval increased residual Left Subdural Hematoma; 12 mm maximal thickness now vs 6-8 mm on 02/25/2022. Small volume para falcine SDH unchanged. 3. No significant midline shift. Stable basilar cisterns. Stable CSF shunt and ventriculomegaly.  COGNITION: Overall cognitive status: History of cognitive impairments - at baseline per pt report since onset of hydrocephalus having increased difficulty with conversation and recall   SENSATION: WFL  COORDINATION: Difficulty with rapid alternating movements on right Finger to nose WNL Heel to shin WNL  EDEMA:  none  MUSCLE TONE: WNL    POSTURE: No Significant postural limitations  LOWER  EXTREMITY ROM:     Active  Right Eval Left Eval  Hip flexion    Hip extension    Hip abduction    Hip adduction    Hip internal rotation    Hip external rotation    Knee flexion    Knee extension    Ankle dorsiflexion 8 15  Ankle plantarflexion    Ankle inversion    Ankle eversion     (Blank rows = not tested)  LOWER EXTREMITY MMT:    MMT Right Eval Left Eval  Hip flexion 4 5  Hip extension    Hip abduction 3 5  Hip adduction 4 5  Hip internal rotation    Hip external rotation    Knee flexion 4 5  Knee extension 4 5  Ankle dorsiflexion 3+ 5  Ankle plantarflexion    Ankle inversion    Ankle eversion    (Blank rows = not tested)  BED MOBILITY:  Not tested  TRANSFERS: Independent Floor to stand: NT    CURB:  Findings: CGA  STAIRS: Findings: Level of Assistance: SBA, Number of Stairs: 6, Height of Stairs: 4-6"   , and Comments: BHR GAIT: Findings: Distance walked: 150, Level of assistance: Modified independence, and Comments: right lateral trunk lean in stance phase  FUNCTIONAL TESTS:  5 times sit to stand: 18.60 sec Timed up and go (TUG): 11.62 sec; TUG cog: 20.75 sec Berg Balance Scale: 43/56 10 meter walk test: 12.22 sec = 2.68 ft/sec  M-CTSIB  Condition 1: Firm Surface, EO 30 Sec, Mild Sway  Condition 2: Firm Surface, EC 30 Sec, Mild and Moderate Sway  Condition 3: Foam Surface, EO 30 Sec, Mild and Moderate Sway  Condition 4: Foam Surface, EC 20 Sec, Moderate and Severe Sway  TREATMENT DATE: 03/20/24      GOALS: Goals reviewed with patient? Yes  SHORT TERM GOALS: Target date: 04/10/2024    Patient will be independent in HEP to improve functional outcomes Baseline: handout was provided and HEP reviewed 04/09/24 Goal status: MET 04/09/24  2.  Demo improved BLE strength and reduced risk for falls per time 15 sec  5xSTS test Baseline: 18 sec; 14.93 sec without UEs 04/09/24  Goal status: MET 04/09/24   3.  Improve safety and access to home environment navigating flight of stairs modified independence Baseline: SBA; Alternating reciprocal pattern with and without rail with good stability 04/09/24 Goal status: MET 04/09/24  4.  Improve proprioceptive awareness and postural stability for improved safety in ADL by mild sway condition 4 M-CTSIB Baseline: mod-severe x 20 sec; 16 sec L LOB 04/09/24 Goal status: IN PROGRESS 04/09/24    LONG TERM GOALS: Target date: 05/01/2024    Demo 10% or less difference TUG test with cognitive dual-task component to improve safety with mobility Baseline: 78% (TUG reg 11.62; TUG cog 20.75 sec) Goal status: INITIAL  2.  Demo low risk for falls per score 52/56 Berg Balance Test Baseline: 43/56 Goal status: INITIAL  3.  Increase gait speed to 3.0 ft/sec to improve efficiency of community ambulation Baseline: 2.68 ft/sec Goal status: INITIAL  4.  Demo independent ambulation community level distances over various surfaces to improve safety in environment Baseline: modified indep level, supervision-SBA uneven Goal status: INITIAL  5.  Independent floor to stand transfers for improved functional mobility Baseline: NT Goal status: INITIAL    ASSESSMENT:  CLINICAL IMPRESSION: Session initiated with focus on strength training via [place] and CKC movements to improve BLE power, strength, and endurance with minimal rest periods enacted throughout activities to promote greater activity tolerance.  Progressed to dynamic balance activities under resistance for added postural perturbations to improve mobility and reduce risk for falls.  Pt notes general fatigue by end of session rates 4-5/10 RPE and HR of 116 bpm during exertion bouts.  Continued sessions to progress POC details and return to work duties.   OBJECTIVE IMPAIRMENTS: Abnormal gait, decreased activity tolerance, decreased  balance, decreased coordination, decreased endurance, decreased mobility, difficulty walking, decreased ROM, and decreased strength.   ACTIVITY LIMITATIONS: carrying, lifting, bending, squatting, stairs, reach over head, and locomotion level  PARTICIPATION LIMITATIONS: meal prep, cleaning, laundry, interpersonal relationship, driving, shopping, community activity, occupation, and yard work  PERSONAL FACTORS: Age, Time since onset of injury/illness/exacerbation, and 1-2 comorbidities: hx of hydrocephalus, HTN are also affecting patient's functional outcome.   REHAB POTENTIAL: Excellent  CLINICAL DECISION MAKING: Evolving/moderate complexity  EVALUATION COMPLEXITY: Moderate  PLAN:  PT FREQUENCY: 2x/week  PT DURATION: 6 weeks  PLANNED INTERVENTIONS: 97750- Physical Performance Testing, 97110-Therapeutic exercises, 97530- Therapeutic activity, [id number]- Neuromuscular re-education, 97535- Self Care, 16109- Manual therapy, [id number]- Gait training, [phone]- Orthotic Initial, [phone]- Orthotic/Prosthetic subsequent, [phone]- Aquatic Therapy, and DME instructions  PLAN FOR NEXT SESSION: continue working on foot clearance    10:44 AM, 04/14/24 M. Kelly Jacques Willingham, PT, DPT Physical Therapist- [place] Office Number: [phone]

## 2024-04-16 ENCOUNTER — Ambulatory Visit: Admitting: Physical Therapy

## 2024-04-16 ENCOUNTER — Ambulatory Visit: Admitting: Occupational Therapy

## 2024-04-16 ENCOUNTER — Encounter: Payer: Self-pay | Admitting: Physical Therapy

## 2024-04-16 DIAGNOSIS — R278 Other lack of coordination: Secondary | ICD-10-CM | POA: Diagnosis not present

## 2024-04-16 DIAGNOSIS — R2681 Unsteadiness on feet: Secondary | ICD-10-CM

## 2024-04-16 DIAGNOSIS — R2689 Other abnormalities of gait and mobility: Secondary | ICD-10-CM

## 2024-04-16 DIAGNOSIS — I69119 Unspecified symptoms and signs involving cognitive functions following nontraumatic intracerebral hemorrhage: Secondary | ICD-10-CM

## 2024-04-16 DIAGNOSIS — I69118 Other symptoms and signs involving cognitive functions following nontraumatic intracerebral hemorrhage: Secondary | ICD-10-CM

## 2024-04-16 DIAGNOSIS — M6281 Muscle weakness (generalized): Secondary | ICD-10-CM

## 2024-04-16 NOTE — Therapy (Signed)
 OUTPATIENT OCCUPATIONAL THERAPY NEURO  Treatment Note  Patient Name: BERTON BUTRICK MRN: [id number] DOB:1961/01/17, 63 y.o., male Today's Date: 04/16/2024  PCP: Olin Bertin, MD REFERRING PROVIDER: Zelda Hickman, PA-C  END OF SESSION:  OT End of Session - 04/16/24 1201     Visit Number 9    Number of Visits 13    Date for OT Re-Evaluation 05/02/24    Authorization Type [employer] - 60 visits combined (OT/PT/ST)    OT Start Time 1103    OT Stop Time 1142    OT Time Calculation (min) 39 min    Activity Tolerance Patient tolerated treatment well;Patient limited by fatigue    Behavior During Therapy WFL for tasks assessed/performed                     Past Medical History:  Diagnosis Date   ADD (attention deficit disorder)    Allergic rhinitis, cause unspecified    Chronic kidney disease    Acute renal failure   Hemochromatosis, hereditary (HCC)    seen at [facility]   Hypertension    Major depressive disorder    Stroke [facility])    Past Surgical History:  Procedure Laterality Date   CRANIOTOMY Left 02/24/2024   Procedure: CRANIOTOMY HEMATOMA EVACUATION SUBDURAL;  Surgeon: Joaquin Mulberry, MD;  Location: [facility] OR;  Service: Neurosurgery;  Laterality: Left;   elbow surgury  1969   left elbow   INGUINAL HERNIA REPAIR  2003   left   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/2023   Procedure: Shunt Placment - right occipital;  Surgeon: Agustina Aldrich, MD;  Location: [facility] OR;  Service: Neurosurgery;  Laterality: Right;   Patient Active Problem List   Diagnosis Date Noted   Hyponatremia 03/17/2024   GERD (gastroesophageal reflux disease) 03/17/2024   Anxiety disorder with panic attacks 03/17/2024   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 03/11/2024   Subdural hematoma (HCC) 02/24/2024   S/P craniotomy 02/24/2024   Obstructive hydrocephalus (HCC) 03/13/2023   Ischemic cerebrovascular accident (CVA) (HCC) 02/19/2023   Cerebral ventriculomegaly  02/19/2023   Acute renal failure superimposed on stage 2 chronic kidney disease (HCC) 02/19/2023   Hypertensive urgency 02/19/2023   Abnormal CXR 02/19/2023   CVA (cerebral vascular accident) (HCC) 02/19/2023   Fatigue 09/20/2012   Family history of hemochromatosis 09/20/2012   Preventative health care 09/06/2012   ADD (attention deficit disorder)    Allergic rhinitis     ONSET DATE: 02/24/24  REFERRING DIAG: N82.956 (ICD-10-CM) - 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 Per pt "everything is great." Pt reported sometimes feeling more fatigued at home.  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 He was found to have massive left SDH with signs of septations and small subacute right parafalcine SDH with midline shift. He was evaluated by Dr. Rochelle Chu and underwent left fronto-temporoparietal crani for evacuation of large chronic LDH the same day. He was placed on decadron  and on Keppra  for seizure prophylaxis and serial CT head showed increaseing post op pneumocephalus  with residual L-SDH decreased to 6-8 mm from 12 mm and resolution of medline shift.  PRECAUTIONS: None  WEIGHT BEARING RESTRICTIONS: No  PAIN:  Are you having pain? Yes: NPRS scale: 1/10 Pain location: B knees Pain description: nagging, irritating Aggravating factors: unknown Relieving factors:  unknown  FALLS: Has patient fallen in last 6 months? 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  HAND DOMINANCE: Right  ADLs: Overall ADLs: Mod I - reports more time consuming overall, mild difficulty with getting in/out of shower but no physical assistance Transfers/ambulation related to ADLs: Mod I - using RW 50% of time in the home and when walking in the cul-de-sac Equipment: Grab bars, Walk in shower, and hand held shower head  IADLs: Light housekeeping: is not currently performing and wife did majority even prior Meal Prep: is not currently performing and wife did majority even prior Community mobility: not currently cleared to drive  Medication management: spouse is assisting pt with taking meds appropriately Financial management: spouse is managing bills at this time Handwriting: 100% legible completed PPT #1([place]): 11.66 sec  MOBILITY STATUS: slower and decreased endurance  POSTURE COMMENTS:  No Significant postural limitations  ACTIVITY TOLERANCE: Activity tolerance: diminished  FUNCTIONAL OUTCOME MEASURES: PSFS: 3.0   UPPER EXTREMITY ROM:  WFL bilaterally  UPPER EXTREMITY MMT:   grossly 4/5 bilaterally  COORDINATION: Finger Nose Finger test: mild dysmetria on R and mildly slower compared to L 9 Hole Peg test: Right: 24.53 sec; Left: 29.06 sec Box and Blocks:  Right  45 blocks, Left 43 blocks - frequently picking up 2 blocks but able to recall to only pick up one at a time RAM: pt with decreased motor control of RUE with rapid hand movements, demonstrating "tremor-like" movement on R  SENSATION: WFL  COGNITION: Overall cognitive status: No family/caregiver present to determine baseline cognitive functioning Brief Interview for Mental Status:   Memory:  Word Repetition:   "Sock"  "Blue"  "Bed"   Orientation Level:    Year:   2025  Month: April  Day of Week: Thursday  Memory:  Recall: Able to recall Blue and bed, unable to recall sock even when given a cue.      VISION: Subjective report: "I can't see far away" no changes Baseline vision: Wears glasses for distance only   VISION ASSESSMENT: Ocular ROM: WFL Gaze preference/alignment: WDL Tracking/Visual pursuits: Able to track stimulus in all quads without difficulty Saccades: WFL Visual Fields: no apparent deficits  OBSERVATIONS: Pt requiring increased time to answer questions and somewhat tangential when asked questions about pt impairments, possibly demonstrating decreased awareness vs decreased memory.  Pt requiring increased time with 9 hole peg test and Box and blocks, most likely due to overall slowed processing as pt reports "everything is a process".  TREATMENT DATE:   TherAct FM Coordination: Pt reported has not been completing FM coordination HEP and unsure where handout is located. OT reviewed some RUE FM coordination HEP (additional handout provided per 03/25/24 pt instructions) - to improve R UE FM coordination, dexterity, proprioception, for sequencing. Pt returned demonstration of each exercise: Picking up objects and placing in a container with slot Stacking towers of coins largest to smallest  Multi-tasking: Building [employer] while naming  animals, [place], and vegetables. Pt often paused motor activity while thinking of additional items for categories. Following education to continue with motor task while thinking of items to name, pt demo'd improved efficiency. - to improve memory, sequencing, recall, multi-tasking, BUE FM coordination.  Multi-step Cognitive Dual Tasking of Carrying and Sorting Items: Ambulating short distances (approx. 5 feet) with gait belt, Placing 1-3 lbs dumbbells, bean bags, and blocks in designated areas of clinic - for sequencing, for multi-step tasks, for dynamic balance, to improve attention to safety and task modification PRN. Pt increased speed as task progressed despite OT providing mod v/c to decrease pace and for safety. After a few minutes, OT requested pt to stop activity for check-in and to improve pacing. Pt reported dizziness 6/10 and reported dizziness likely d/t turning head quickly and demo'd some SOB. OT recommended seated rest break and provided pt with water. Pt reported dizziness fully resolved after a few minutes of seated rest, and denied symptoms of lightheadedness, blurry vision. Pt reported dizziness has occurred before though unable to recall when.   Self-Care OT educated pt on energy conservation, grouping tasks, task modifications, deep breathing strategies, and stress management strategies. Handout provided, see pt instructions. Pt verbalized understanding of all.  Pt denied symptoms of blurry vision, headache, lightheadedness, dizziness at end of session. Pt reported feeling "good." Pt safely completed sit-to-stand and safely exited clinic.    PATIENT EDUCATION: Education details: ongoing condition specific education, see above for education on memory strategies and sequencing as needed for return to work. Person educated: Patient Education method: Explanation and Handouts Education comprehension: needs further education  HOME EXERCISE PROGRAM: 03/25/24 - memory strategies and  coordination (see pt instructions) 04/16/24 - energy conservation strategies (see pt instructions)   GOALS: Goals reviewed with patient? Yes  SHORT TERM GOALS: Target date: 04/11/24  Pt will be independent with fine and gross motor control HEP with use of visual handouts. Baseline: slower overall with fine and gross motor tasks Goal status: in progress  2.  Pt will verbalize understanding of task modifications and/or potential AE needs to increase ease, safety, and independence w/ ADLs and IADLs. Baseline: limited engagement in IADLs, slower overall with ADLs Goal status: MET  3.  Pt will demonstrate improved memory, sequencing, and problem solving as demonstrated by improvements in score on pill box assessment by ability to complete in 5 mins time limit with </= to 2 errors.  Baseline: wife managing meds/ TBD 03/25/24 - no errors, but required 6:39 to complete 04/07/24 - no errors, completed in 5:34 Goal status: Partially met  4.  Pt will verbalize understanding of energy conservation strategies.  Baseline: decreased endurance  Goal status: in progress - initiated on 04/09/24   LONG TERM GOALS: Target date: 05/02/24  Pt will demonstrate understanding of memory compensations and ways to keep thinking skills sharp Baseline:  Goal status:  in progress  2.  Pt will perform dynamic standing task for 10 mins as needed to complete simple IADLs w/o LOB using DME and/or countertop  support prn. Baseline: decreased engagement in IADLs Goal status:  in progress  3.  Pt will demonstrate ability to sequence simple functional task (simple snack prep, laundry task, etc) at Mod I level with good safety awareness and ability to utilize memory strategies for recall.  Baseline: decreased engagement in IADLs Goal status:  in progress  4.  Pt will navigate a moderately busy environment, completing dual task activity and/or following multi-step commands with 90% accuracy Baseline: impaired memory and  thinking strategies and endurance impacting return to work Goal status:  in progress  5.  Pt will be independent with utilization of memory strategies to allow for increased independence with managing personal appointments and in preparation for return to work. Baseline: decreased memory and thinking strategies Goal status:  in progress  6.  Patient will report at least two-point increase in average PSFS score or at least three-point increase in a single activity score indicating functionally significant improvement given minimum detectable change. Baseline: 3.0 Goal status:  in progress  ASSESSMENT:  CLINICAL IMPRESSION: Pt tolerated tasks fairly well, continuing to benefit from extra time for sequencing. Pt benefited from v/c for pacing during tasks. Pt reported some dizziness when ambulating short distances with frequent turns though reported dizziness fully resolved following seated rest break and hydration with water. OT educated pt on energy conservation. Pt verbalized understanding though pt would likely benefit from additional review at upcoming sessions to improve pacing and energy conservation. OT updated OT/PT staff regarding dizziness symptoms to monitor at upcoming OT/PT sessions PRN. Pt will continue to benefit from focus on memory, especially short term memory and establishing a process or routine to increase recall.    PERFORMANCE DEFICITS: in functional skills including ADLs, IADLs, coordination, pain, Fine motor control, Gross motor control, balance, body mechanics, endurance, decreased knowledge of precautions, and UE functional use, cognitive skills including emotional, energy/drive, memory, safety awareness, and thought, and psychosocial skills including coping strategies and routines and behaviors.     PLAN:  OT FREQUENCY: 2x/week  OT DURATION: 6 weeks  PLANNED INTERVENTIONS: 97168 OT Re-evaluation, 97535 self care/ADL training, 81191 therapeutic exercise, 97530  therapeutic activity, 97112 neuromuscular re-education, functional mobility training, visual/perceptual remediation/compensation, psychosocial skills training, energy conservation, coping strategies training, patient/family education, and DME and/or AE instructions  RECOMMENDED OTHER SERVICES: may benefit from SLP, TBD after additional session and assessment of SLUMS  CONSULTED AND AGREED WITH PLAN OF CARE: Patient  PLAN FOR NEXT SESSION:   Start with coordination HEP and work on [employer] activities and recall strategies.  Reiterate energy conservation and pacing during functional tasks  Cognitive/motor dual tasking while naming items in category with mobility/ball toss, incorporate picking up/carrying heavy items? - note: avoid frequent turns   Oakley Bellman, OTR/L 04/16/2024, 12:22 PM   [facility] [address] [place], [place] [address] Phone # [phone] Fax # [phone]

## 2024-04-16 NOTE — Therapy (Signed)
 OUTPATIENT PHYSICAL THERAPY NEURO TREATMENT   Patient Name: Micheal Ayers MRN: [id number] DOB:June 13, 1961, 63 y.o., male Today's Date: 04/16/2024   PCP: Olin Bertin, MD REFERRING PROVIDER: Zelda Hickman, PA-C  END OF SESSION:  PT End of Session - 04/16/24 1022     Visit Number 9    Number of Visits 13    Date for PT Re-Evaluation 05/01/24    Authorization Type [employer]    Authorization Time Period 60 visit limit combined    Authorization - Visit Number 8    Authorization - Number of Visits 30    PT Start Time 1021    PT Stop Time 1100    PT Time Calculation (min) 39 min    Equipment Utilized During Treatment Gait belt    Activity Tolerance Patient tolerated treatment well    Behavior During Therapy WFL for tasks assessed/performed                Past Medical History:  Diagnosis Date   ADD (attention deficit disorder)    Allergic rhinitis, cause unspecified    Chronic kidney disease    Acute renal failure   Hemochromatosis, hereditary (HCC)    seen at [facility]   Hypertension    Major depressive disorder    Stroke [facility])    Past Surgical History:  Procedure Laterality Date   CRANIOTOMY Left 02/24/2024   Procedure: CRANIOTOMY HEMATOMA EVACUATION SUBDURAL;  Surgeon: Joaquin Mulberry, MD;  Location: [facility] OR;  Service: Neurosurgery;  Laterality: Left;   elbow surgury  1969   left elbow   INGUINAL HERNIA REPAIR  2003   left   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/2023   Procedure: Shunt Placment - right occipital;  Surgeon: Agustina Aldrich, MD;  Location: [facility] OR;  Service: Neurosurgery;  Laterality: Right;   Patient Active Problem List   Diagnosis Date Noted   Hyponatremia 03/17/2024   GERD (gastroesophageal reflux disease) 03/17/2024   Anxiety disorder with panic attacks 03/17/2024   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 03/11/2024   Subdural hematoma (HCC) 02/24/2024   S/P craniotomy 02/24/2024   Obstructive hydrocephalus (HCC)  03/13/2023   Ischemic cerebrovascular accident (CVA) (HCC) 02/19/2023   Cerebral ventriculomegaly 02/19/2023   Acute renal failure superimposed on stage 2 chronic kidney disease (HCC) 02/19/2023   Hypertensive urgency 02/19/2023   Abnormal CXR 02/19/2023   CVA (cerebral vascular accident) (HCC) 02/19/2023   Fatigue 09/20/2012   Family history of hemochromatosis 09/20/2012   Preventative health care 09/06/2012   ADD (attention deficit disorder)    Allergic rhinitis     ONSET DATE: 02/19/24  REFERRING DIAG:  G29.528 (ICD-10-CM) - 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    Pt accompanied by: self  PERTINENT HISTORY: 63 y.o. male with history of CVA 01/2023, obstructive hydrocephalus status post VP shunt 02/2023, chronic/GAD, panic attacks, low back to 02/24/2024 with 2-day history of right facial droop and difficulty DH with signs of septations and small subacute right parafalcine SDH with midline shift.  He underwent left for evacuation of bleed by Dr. Rochelle Chu.  Postop placed on Decadron  as well as Keppra  for seizure prophylaxis.  Follow-up CT showed resolution of midline shift Decadron  was discharged.  Therapy was working with patient who was noted to have balance deficits with decreasing coordination on right, difficulty weightbearing through RLE as well as tendency to drift when walking.  He was independent working prior to admission.  CIR was recommended due to functional decline.  PAIN:  Are you having pain? Yes: NPRS  scale: 3/10 Pain location: B LEs Pain description: aching, irritating  Aggravating factors:   Relieving factors:    PRECAUTIONS: Other:  Special Instructions: No driving, strenuous activity or return to work till cleared by MD. *04/16/2024:  Has been cleared to drive* Recommend repeat check BMET in 1-2 weeks to monitor sodium levels  RED FLAGS: None   WEIGHT BEARING RESTRICTIONS: No  FALLS: Has patient fallen in last 6 months? No  LIVING ENVIRONMENT: Lives with: lives with their family and lives with their spouse Lives in: House/apartment Stairs: Yes: Internal: flight steps; can reach both Has following equipment at home: Otho Blitz - 2 wheeled Master on main floor. PLOF: Independent,   PATIENT GOALS: Improve balance, walking, and mobility  OBJECTIVE:    TODAY'S TREATMENT: 04/16/2024 Activity Comments  LAQ 3 x 15  10#, BLE  Seated march with alt UE lift , 3 x 8-10 reps 10# BLE, 4# UE  Vitals 97%, HR 104 bpm RPE 7/10  Sidestepping along counter, 2 min 10#, BLE  Forward/back walking, 2 min 10#, BLE  Monster walk forward/back, 4 reps UE support  Tandem walk forward/back, 4 reps, progressing to tandem march UE support      Access Code: [email or web address] URL: [email or web address] Date: 04/16/2024 Prepared by: [facility] - Outpatient  Rehab - [facility]  Exercises - Corner Balance Feet Together With Eyes Closed  - 1 x daily - 7 x weekly - 3 sets - 30 sec hold - Corner Balance Feet Together: Eyes Open With Head Turns  - 1 x daily - 7 x weekly - 3 sets - 30 sec hold - Corner Balance Feet Together: Eyes Closed With Head Turns  - 1 x daily - 7 x weekly - 3 sets - 30 sec hold - Sit to Stand in Stride with AFO and UE Assist in LE Alignment  - 1 x daily - 7 x weekly - 3 sets - 10 reps - Standing Foot Lift on Box (BKA)  - 1 x daily - 7 x weekly - 2-3 sets - 10 reps - Side Stepping with Counter Support  - 2 x daily - 7 x weekly - 2-3 min hold - Backward Walking with Counter  Support  - 2 x daily - 7 x weekly - 2-3 min hold - Forward and Backward Monster Walk with Resistance at Ankles and Counter Support  - 2 x daily - 7 x weekly - 2-3 min hold - Tandem Walking with Counter Support  - 7 x weekly - 2 min hold   PATIENT EDUCATION: Education details: Additions  to HEP Person educated: Patient Education method: Explanation, Demonstration, Tactile cues, Verbal cues, and Handouts Education comprehension: verbalized understanding and returned demonstration   ----------------------------------------------------------  Note: Objective measures were completed at Evaluation unless otherwise noted.  DIAGNOSTIC FINDINGS:  IMPRESSION: 1. Increasing postoperative Pneumocephalus, now affecting both anterior frontal lobes (see series 4, image 17). 2. Subdural drain removed with interval increased residual Left Subdural Hematoma; 12 mm maximal thickness now vs 6-8 mm on 02/25/2022. Small volume para falcine SDH unchanged. 3. No significant midline shift. Stable basilar cisterns. Stable CSF shunt and ventriculomegaly.  COGNITION: Overall cognitive status: History of cognitive impairments - at baseline per pt report since onset of hydrocephalus having increased difficulty with conversation and recall   SENSATION: WFL  COORDINATION: Difficulty with rapid alternating movements on right Finger to nose WNL Heel to shin WNL  EDEMA:  none  MUSCLE TONE: WNL    POSTURE: No Significant postural limitations  LOWER EXTREMITY ROM:     Active  Right Eval Left Eval  Hip flexion    Hip extension    Hip abduction    Hip adduction    Hip internal rotation    Hip external rotation    Knee flexion    Knee extension    Ankle dorsiflexion 8 15  Ankle plantarflexion    Ankle inversion    Ankle eversion     (Blank rows = not tested)  LOWER EXTREMITY MMT:    MMT Right Eval Left Eval  Hip flexion 4 5  Hip extension    Hip abduction 3 5  Hip adduction 4 5  Hip  internal rotation    Hip external rotation    Knee flexion 4 5  Knee extension 4 5  Ankle dorsiflexion 3+ 5  Ankle plantarflexion    Ankle inversion    Ankle eversion    (Blank rows = not tested)  BED MOBILITY:  Not tested  TRANSFERS: Independent Floor to stand: NT    CURB:  Findings: CGA  STAIRS: Findings: Level of Assistance: SBA, Number of Stairs: 6, Height of Stairs: 4-6"   , and Comments: BHR GAIT: Findings: Distance walked: 150, Level of assistance: Modified independence, and Comments: right lateral trunk lean in stance phase  FUNCTIONAL TESTS:  5 times sit to stand: 18.60 sec Timed up and go (TUG): 11.62 sec; TUG cog: 20.75 sec Berg Balance Scale: 43/56 10 meter walk test: 12.22 sec = 2.68 ft/sec  M-CTSIB  Condition 1: Firm Surface, EO 30 Sec, Mild Sway  Condition 2: Firm Surface, EC 30 Sec, Mild and Moderate Sway  Condition 3: Foam Surface, EO 30 Sec, Mild and Moderate Sway  Condition 4: Foam Surface, EC 20 Sec, Moderate and Severe Sway                                                                                                                                  TREATMENT DATE: 03/20/24  GOALS: Goals reviewed with patient? Yes  SHORT TERM GOALS: Target date: 04/10/2024    Patient will be independent in HEP to improve functional outcomes Baseline: handout was provided and HEP reviewed 04/09/24 Goal status: MET 04/09/24  2.  Demo improved BLE strength and reduced risk for falls per time 15 sec 5xSTS test Baseline: 18 sec; 14.93 sec without UEs 04/09/24  Goal status: MET 04/09/24   3.  Improve safety and access to home environment navigating flight of stairs modified independence Baseline: SBA; Alternating reciprocal pattern with and without rail with good stability 04/09/24 Goal status: MET 04/09/24  4.  Improve proprioceptive awareness and postural stability for improved safety in ADL by mild sway condition 4 M-CTSIB Baseline: mod-severe x 20 sec;  16 sec L LOB 04/09/24 Goal status: IN PROGRESS 04/09/24    LONG TERM GOALS: Target date: 05/01/2024    Demo 10% or less difference TUG test with cognitive dual-task component to improve safety with mobility Baseline: 78% (TUG reg 11.62; TUG cog 20.75 sec) Goal status: INITIAL  2.  Demo low risk for falls per score 52/56 Berg Balance Test Baseline: 43/56 Goal status: INITIAL  3.  Increase gait speed to 3.0 ft/sec to improve efficiency of community ambulation Baseline: 2.68 ft/sec Goal status: INITIAL  4.  Demo independent ambulation community level distances over various surfaces to improve safety in environment Baseline: modified indep level, supervision-SBA uneven Goal status: INITIAL  5.  Independent floor to stand transfers for improved functional mobility Baseline: NT Goal status: INITIAL    ASSESSMENT:  CLINICAL IMPRESSION: Pt presents today with no new complaints. Skilled PT session focused on lower extremity strengthening and dynamic balance activities. Pt needs light UE support for balance activities at counter; he does better with cues for taller posture and to look ahead at a target.  He does report fatigue at end of session, but vitals for HR are 104-106 bpm, RPE up to 6/10.  Pt will continue to benefit from skilled PT towards goals for improved functional mobility and decreased fall risk.  OBJECTIVE IMPAIRMENTS: Abnormal gait, decreased activity tolerance, decreased balance, decreased coordination, decreased endurance, decreased mobility, difficulty walking, decreased ROM, and decreased strength.   ACTIVITY LIMITATIONS: carrying, lifting, bending, squatting, stairs, reach over head, and locomotion level  PARTICIPATION LIMITATIONS: meal prep, cleaning, laundry, interpersonal relationship, driving, shopping, community activity, occupation, and yard work  PERSONAL FACTORS: Age, Time since onset of injury/illness/exacerbation, and 1-2 comorbidities: hx of hydrocephalus,  HTN are also affecting patient's functional outcome.   REHAB POTENTIAL: Excellent  CLINICAL DECISION MAKING: Evolving/moderate complexity  EVALUATION COMPLEXITY: Moderate  PLAN:  PT FREQUENCY: 2x/week  PT DURATION: 6 weeks  PLANNED INTERVENTIONS: 97750- Physical Performance Testing, 97110-Therapeutic exercises, 97530- Therapeutic activity, [id number]- Neuromuscular re-education, 97535- Self Care, 16109- Manual therapy, [id number]- Gait training, [phone]- Orthotic Initial, [phone]- Orthotic/Prosthetic subsequent, [phone]- Aquatic Therapy, and DME instructions  PLAN FOR NEXT SESSION: Work on stamina, balance, endurance; check HEP additions; work towards LTGs.      Dessie Flow, PT 04/16/24 12:38 PM Phone: [phone] Fax: [phone]  [facility] at [facility] [address] [place], [address] [place], [place] [address] Phone # [phone] Fax # [phone]

## 2024-04-22 ENCOUNTER — Ambulatory Visit: Admitting: Occupational Therapy

## 2024-04-22 ENCOUNTER — Ambulatory Visit

## 2024-04-22 DIAGNOSIS — I69119 Unspecified symptoms and signs involving cognitive functions following nontraumatic intracerebral hemorrhage: Secondary | ICD-10-CM

## 2024-04-22 DIAGNOSIS — R262 Difficulty in walking, not elsewhere classified: Secondary | ICD-10-CM

## 2024-04-22 DIAGNOSIS — R2681 Unsteadiness on feet: Secondary | ICD-10-CM

## 2024-04-22 DIAGNOSIS — R278 Other lack of coordination: Secondary | ICD-10-CM

## 2024-04-22 DIAGNOSIS — M6281 Muscle weakness (generalized): Secondary | ICD-10-CM

## 2024-04-22 DIAGNOSIS — R2689 Other abnormalities of gait and mobility: Secondary | ICD-10-CM

## 2024-04-22 DIAGNOSIS — I69118 Other symptoms and signs involving cognitive functions following nontraumatic intracerebral hemorrhage: Secondary | ICD-10-CM

## 2024-04-22 NOTE — Therapy (Signed)
 OUTPATIENT OCCUPATIONAL THERAPY NEURO  Treatment Note  Patient Name: Micheal Ayers MRN: [id number] DOB:1961/04/28, 63 y.o., male Today's Date: 04/22/2024  PCP: Olin Bertin, MD REFERRING PROVIDER: Zelda Hickman, PA-C  END OF SESSION:  OT End of Session - 04/22/24 0853     Visit Number 10    Number of Visits 13    Date for OT Re-Evaluation 05/02/24    Authorization Type [employer] - 60 visits combined (OT/PT/ST)    OT Start Time [id number]    OT Stop Time 0928    OT Time Calculation (min) 42 min    Activity Tolerance Patient tolerated treatment well;Patient limited by fatigue    Behavior During Therapy [facility] for tasks assessed/performed                      Past Medical History:  Diagnosis Date   ADD (attention deficit disorder)    Allergic rhinitis, cause unspecified    Chronic kidney disease    Acute renal failure   Hemochromatosis, hereditary (HCC)    seen at [facility]   Hypertension    Major depressive disorder    Stroke [facility])    Past Surgical History:  Procedure Laterality Date   CRANIOTOMY Left 02/24/2024   Procedure: CRANIOTOMY HEMATOMA EVACUATION SUBDURAL;  Surgeon: Joaquin Mulberry, MD;  Location: [facility] OR;  Service: Neurosurgery;  Laterality: Left;   elbow surgury  1969   left elbow   INGUINAL HERNIA REPAIR  2003   left   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/2023   Procedure: Shunt Placment - right occipital;  Surgeon: Agustina Aldrich, MD;  Location: [facility] OR;  Service: Neurosurgery;  Laterality: Right;   Patient Active Problem List   Diagnosis Date Noted   Hyponatremia 03/17/2024   GERD (gastroesophageal reflux disease) 03/17/2024   Anxiety disorder with panic attacks 03/17/2024   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 03/11/2024   Subdural hematoma (HCC) 02/24/2024   S/P craniotomy 02/24/2024   Obstructive hydrocephalus (HCC) 03/13/2023   Ischemic cerebrovascular accident (CVA) (HCC) 02/19/2023   Cerebral ventriculomegaly  02/19/2023   Acute renal failure superimposed on stage 2 chronic kidney disease (HCC) 02/19/2023   Hypertensive urgency 02/19/2023   Abnormal CXR 02/19/2023   CVA (cerebral vascular accident) (HCC) 02/19/2023   Fatigue 09/20/2012   Family history of hemochromatosis 09/20/2012   Preventative health care 09/06/2012   ADD (attention deficit disorder)    Allergic rhinitis     ONSET DATE: 02/24/24  REFERRING DIAG: O96.295 (ICD-10-CM) - 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  He reports that it happens "when I turn my head real quickly."  Pt reports today is to be his first day back to work.  Pt reports some "apprehension" about it just because he had been so new when he went out.  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 He was found to have massive left SDH with signs of septations and small subacute right parafalcine SDH with midline shift. He  was evaluated by Dr. Rochelle Chu and underwent left fronto-temporoparietal crani for evacuation of large chronic LDH the same day. He was placed on decadron  and on Keppra  for seizure prophylaxis and serial CT head showed increaseing post op pneumocephalus with residual L-SDH decreased to 6-8 mm from 12 mm and resolution of medline shift.  PRECAUTIONS: None  WEIGHT BEARING RESTRICTIONS:  No  PAIN:  Are you having pain? No  FALLS: Has patient fallen in last 6 months? 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  HAND DOMINANCE: Right  ADLs: Overall ADLs: Mod I - reports more time consuming overall, mild difficulty with getting in/out of shower but no physical assistance Transfers/ambulation related to ADLs: Mod I - using RW 50% of time in the home and when walking in the cul-de-sac Equipment: Grab bars, Walk in shower, and hand held shower head  IADLs: Light housekeeping: is not currently performing and wife did majority even prior Meal Prep: is not currently performing and wife did majority even prior Community mobility: not currently cleared to drive  Medication management: spouse is assisting pt with taking meds appropriately Financial management: spouse is managing bills at this time Handwriting: 100% legible completed PPT #1([place]): 11.66 sec  MOBILITY STATUS: slower and decreased endurance  POSTURE COMMENTS:  No Significant postural limitations  ACTIVITY TOLERANCE: Activity tolerance: diminished  FUNCTIONAL OUTCOME MEASURES: PSFS: 3.0   UPPER EXTREMITY ROM:  WFL bilaterally  UPPER EXTREMITY MMT:   grossly 4/5 bilaterally  COORDINATION: Finger Nose Finger test: mild dysmetria on R and mildly slower compared to L 9 Hole Peg test: Right: 24.53 sec; Left:  29.06 sec Box and Blocks:  Right 45 blocks, Left 43 blocks - frequently picking up 2 blocks but able to recall to only pick up one at a time RAM: pt with decreased motor control of RUE with rapid hand movements, demonstrating "tremor-like" movement on R  SENSATION: WFL  COGNITION: Overall cognitive status: No family/caregiver present to determine baseline cognitive functioning Brief Interview for Mental Status:   Memory:  Word Repetition:   "Sock"  "Blue"  "Bed"   Orientation Level:    Year:   2025  Month: April  Day of Week: Thursday  Memory:  Recall: Able to recall Blue and bed, unable to recall sock even when given a cue.      VISION: Subjective report: "I can't see far away" no changes Baseline vision: Wears glasses for distance only   VISION ASSESSMENT: Ocular ROM: WFL Gaze preference/alignment: WDL Tracking/Visual pursuits: Able to track stimulus in all quads without difficulty Saccades: WFL Visual Fields: no apparent deficits  OBSERVATIONS: Pt requiring increased time to answer questions and somewhat tangential when asked questions about pt impairments, possibly demonstrating decreased awareness vs decreased memory.  Pt requiring increased time with 9 hole peg test and Box and blocks, most likely due to overall slowed processing as pt reports "everything is a process".  TREATMENT DATE:  04/22/24 Coordination/cognition: engaged in small peg board pattern replication with focus on coordination and attention to task.  Pt completing with good attention to task and no distraction in moderately distracting environment.  OT had pt complete additional pattern while naming items within category to further challenge alternating attention. Pt initially skipping pegs to complete task in grouping to increase recall.  OT encouraged pt to still engage in some  alternating of colors to further challenge memory.  OT reiterated memory strategies with "WARM" and functional carryover with writing down sequencing of items or list to increase recall when running errands or system when returning to work.  Planning/organizing: engaged in complex planning/problem solving prompt requiring pt to organize and plan for meal, to include making a menu and considering amounts and timing.  OT also then providing additional information with money amounts to further challenge mental math and decision making.  Pt utilizing pen and paper to aid in list making for recall and to alter amounts during mostly mental math. RTW: engaged in review of previous education with taking rest breaks, focusing on completing one task at a time, and use of mental vs physical rest breaks as needed to allow pt to engage in active rest if able.  Pt reporting still somewhat "anxious" about the unknown and his expectations of himself but his supervisor has been supportive of his return to work.  PATIENT EDUCATION: Education details: ongoing condition specific education, see above for education on memory strategies and sequencing as needed for return to work. Person educated: Patient Education method: Explanation and Handouts Education comprehension: needs further education  HOME EXERCISE PROGRAM: 03/25/24 - memory strategies and coordination (see pt instructions) 04/16/24 - energy conservation strategies (see pt instructions)   GOALS: Goals reviewed with patient? Yes  SHORT TERM GOALS: Target date: 04/11/24  Pt will be independent with fine and gross motor control HEP with use of visual handouts. Baseline: slower overall with fine and gross motor tasks Goal status: in progress  2.  Pt will verbalize understanding of task modifications and/or potential AE needs to increase ease, safety, and independence w/ ADLs and IADLs. Baseline: limited engagement in IADLs, slower overall with ADLs Goal  status: MET  3.  Pt will demonstrate improved memory, sequencing, and problem solving as demonstrated by improvements in score on pill box assessment by ability to complete in 5 mins time limit with </= to 2 errors.  Baseline: wife managing meds/ TBD 03/25/24 - no errors, but required 6:39 to complete 04/07/24 - no errors, completed in 5:34 Goal status: Partially met  4.  Pt will verbalize understanding of energy conservation strategies.  Baseline: decreased endurance  Goal status: in progress - initiated on 04/09/24   LONG TERM GOALS: Target date: 05/02/24  Pt will demonstrate understanding of memory compensations and ways to keep thinking skills sharp Baseline:  Goal status:  in progress  2.  Pt will perform dynamic standing task for 10 mins as needed to complete simple IADLs w/o LOB using DME and/or countertop support prn. Baseline: decreased engagement in IADLs Goal status:  in progress  3.  Pt will demonstrate ability to sequence simple functional task (simple snack prep, laundry task, etc) at Mod I level with good safety awareness and ability to utilize memory strategies for recall.  Baseline: decreased engagement in IADLs Goal status:  in progress  4.  Pt will navigate a moderately busy environment, completing dual task activity and/or following multi-step commands with 90% accuracy Baseline: impaired  memory and thinking strategies and endurance impacting return to work Goal status:  in progress  5.  Pt will be independent with utilization of memory strategies to allow for increased independence with managing personal appointments and in preparation for return to work. Baseline: decreased memory and thinking strategies Goal status:  in progress  6.  Patient will report at least two-point increase in average PSFS score or at least three-point increase in a single activity score indicating functionally significant improvement given minimum detectable change. Baseline: 3.0 Goal  status:  in progress  ASSESSMENT:  CLINICAL IMPRESSION: Pt tolerated tasks well, continuing to benefit from extra time for sequencing. Pt utilizing written lists and grouping to increase recall and sequencing during dual motor/cognitive tasks.  Pt will continue to benefit from focus on memory, especially short term memory and establishing a process or routine to increase recall and allow safe return to work.   PERFORMANCE DEFICITS: in functional skills including ADLs, IADLs, coordination, pain, Fine motor control, Gross motor control, balance, body mechanics, endurance, decreased knowledge of precautions, and UE functional use, cognitive skills including emotional, energy/drive, memory, safety awareness, and thought, and psychosocial skills including coping strategies and routines and behaviors.     PLAN:  OT FREQUENCY: 2x/week  OT DURATION: 6 weeks  PLANNED INTERVENTIONS: 97168 OT Re-evaluation, 97535 self care/ADL training, 62130 therapeutic exercise, 97530 therapeutic activity, 97112 neuromuscular re-education, functional mobility training, visual/perceptual remediation/compensation, psychosocial skills training, energy conservation, coping strategies training, patient/family education, and DME and/or AE instructions  RECOMMENDED OTHER SERVICES: may benefit from SLP, TBD after additional session and assessment of SLUMS  CONSULTED AND AGREED WITH PLAN OF CARE: Patient  PLAN FOR NEXT SESSION:   Reiterate energy conservation and pacing during functional tasks  Cognitive/motor dual tasking while naming items in category with mobility/ball toss, incorporate picking up/carrying heavy items? - note: avoid frequent turns   Anthonette Kinsman, OTR/L 04/22/2024, 10:38 AM   [facility] at [facility] [address], [address] [place], [place] [address] Phone # [phone] Fax # [phone]

## 2024-04-22 NOTE — Therapy (Signed)
 OUTPATIENT PHYSICAL THERAPY NEURO TREATMENT   Patient Name: Micheal Ayers MRN: [id number] DOB:09/04/1961, 63 y.o., male Today's Date: 04/22/2024   PCP: Olin Bertin, MD REFERRING PROVIDER: Zelda Hickman, PA-C  END OF SESSION:  PT End of Session - 04/22/24 0936     Visit Number 10    Number of Visits 13    Date for PT Re-Evaluation 05/01/24    Authorization Type [employer]    Authorization Time Period 60 visit limit combined    Authorization - Visit Number 9    Authorization - Number of Visits 30    PT Start Time 0930    PT Stop Time 1015    PT Time Calculation (min) 45 min    Equipment Utilized During Treatment Gait belt    Activity Tolerance Patient tolerated treatment well    Behavior During Therapy WFL for tasks assessed/performed                Past Medical History:  Diagnosis Date   ADD (attention deficit disorder)    Allergic rhinitis, cause unspecified    Chronic kidney disease    Acute renal failure   Hemochromatosis, hereditary (HCC)    seen at [facility]   Hypertension    Major depressive disorder    Stroke [facility])    Past Surgical History:  Procedure Laterality Date   CRANIOTOMY Left 02/24/2024   Procedure: CRANIOTOMY HEMATOMA EVACUATION SUBDURAL;  Surgeon: Joaquin Mulberry, MD;  Location: [facility] OR;  Service: Neurosurgery;  Laterality: Left;   elbow surgury  1969   left elbow   INGUINAL HERNIA REPAIR  2003   left   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/2023   Procedure: Shunt Placment - right occipital;  Surgeon: Agustina Aldrich, MD;  Location: [facility] OR;  Service: Neurosurgery;  Laterality: Right;   Patient Active Problem List   Diagnosis Date Noted   Hyponatremia 03/17/2024   GERD (gastroesophageal reflux disease) 03/17/2024   Anxiety disorder with panic attacks 03/17/2024   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 03/11/2024   Subdural hematoma (HCC) 02/24/2024   S/P craniotomy 02/24/2024   Obstructive hydrocephalus (HCC)  03/13/2023   Ischemic cerebrovascular accident (CVA) (HCC) 02/19/2023   Cerebral ventriculomegaly 02/19/2023   Acute renal failure superimposed on stage 2 chronic kidney disease (HCC) 02/19/2023   Hypertensive urgency 02/19/2023   Abnormal CXR 02/19/2023   CVA (cerebral vascular accident) (HCC) 02/19/2023   Fatigue 09/20/2012   Family history of hemochromatosis 09/20/2012   Preventative health care 09/06/2012   ADD (attention deficit disorder)    Allergic rhinitis     ONSET DATE: 02/19/24  REFERRING DIAG:  W09.811 (ICD-10-CM) - 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 male with history of CVA 01/2023, obstructive hydrocephalus status post VP shunt 02/2023, chronic/GAD, panic attacks, low back to 02/24/2024 with 2-day history of right facial droop and difficulty DH with signs of septations and small subacute right parafalcine SDH with midline shift.  He underwent left for evacuation of bleed by Dr. Rochelle Chu.  Postop placed on Decadron  as well as Keppra  for seizure prophylaxis.  Follow-up CT showed resolution of midline shift Decadron  was discharged.  Therapy was working with patient who was noted to have balance deficits with decreasing coordination on right, difficulty weightbearing through RLE as well as tendency to drift when walking.  He was independent working prior to admission.  CIR was recommended due to functional decline.  PAIN:  Are you having pain? Yes: NPRS  scale: 3/10 Pain location: B LEs Pain description: aching, irritating  Aggravating factors:   Relieving factors:    PRECAUTIONS: Other:  Special Instructions: No driving, strenuous activity or return to work till cleared by MD. *04/16/2024:  Has been cleared to drive* Recommend repeat check BMET in 1-2 weeks to monitor sodium levels  RED FLAGS: None   WEIGHT BEARING RESTRICTIONS: No  FALLS: Has patient fallen in last 6 months? No  LIVING ENVIRONMENT: Lives with: lives with their family and lives with their spouse Lives in: House/apartment Stairs: Yes: Internal: flight steps; can reach both Has following equipment at home: Otho Blitz - 2 wheeled Master on main floor. PLOF: Independent,   PATIENT GOALS: Improve balance, walking, and mobility  OBJECTIVE:   TODAY'S TREATMENT: 04/22/24 Activity Comments  Weighted sled push-pull x 2 min Rates medium effort 45#  Weighted carry 25# x 2 min Easy effort  Step-up overhead reach   Balance/carry             TODAY'S TREATMENT: 04/16/2024 Activity Comments  LAQ 3 x 15  10#, BLE  Seated march with alt UE lift , 3 x 8-10 reps 10# BLE, 4# UE  Vitals 97%, HR 104 bpm RPE 7/10  Sidestepping along counter, 2 min 10#, BLE  Forward/back walking, 2 min 10#, BLE  Monster walk forward/back, 4 reps UE support  Tandem walk forward/back, 4 reps, progressing to tandem march UE support      Access Code: [email or web address] URL: [email or web address] Date: 04/16/2024 Prepared by: [facility] - Outpatient  Rehab - [facility]  Exercises - Corner Balance Feet Together With Eyes Closed  - 1 x daily - 7 x weekly - 3 sets - 30 sec hold - Corner Balance Feet Together: Eyes Open With Head Turns  - 1 x daily - 7 x weekly - 3 sets - 30 sec hold - Corner Balance Feet Together: Eyes Closed With Head Turns  - 1 x daily - 7 x weekly - 3 sets - 30 sec hold - Sit to Stand in Stride with AFO and UE Assist in LE Alignment  - 1 x daily - 7 x weekly - 3 sets  - 10 reps - Standing Foot Lift on Box (BKA)  - 1 x daily - 7 x weekly - 2-3 sets - 10 reps - Side Stepping with Counter Support  - 2 x daily - 7 x weekly - 2-3 min hold - Backward Walking with Counter Support  - 2 x daily - 7 x weekly - 2-3 min hold - Forward and Backward Monster Walk with Resistance at Ankles and Counter Support  - 2 x daily - 7 x weekly -  2-3 min hold - Tandem Walking with Counter Support  - 7 x weekly - 2 min hold   PATIENT EDUCATION: Education details: Additions to HEP Person educated: Patient Education method: Explanation, Demonstration, Tactile cues, Verbal cues, and Handouts Education comprehension: verbalized understanding and returned demonstration   ----------------------------------------------------------  Note: Objective measures were completed at Evaluation unless otherwise noted.  DIAGNOSTIC FINDINGS:  IMPRESSION: 1. Increasing postoperative Pneumocephalus, now affecting both anterior frontal lobes (see series 4, image 17). 2. Subdural drain removed with interval increased residual Left Subdural Hematoma; 12 mm maximal thickness now vs 6-8 mm on 02/25/2022. Small volume para falcine SDH unchanged. 3. No significant midline shift. Stable basilar cisterns. Stable CSF shunt and ventriculomegaly.  COGNITION: Overall cognitive status: History of cognitive impairments - at baseline per pt report since onset of hydrocephalus having increased difficulty with conversation and recall   SENSATION: WFL  COORDINATION: Difficulty with rapid alternating movements on right Finger to nose WNL Heel to shin WNL  EDEMA:  none  MUSCLE TONE: WNL    POSTURE: No Significant postural limitations  LOWER EXTREMITY ROM:     Active  Right Eval Left Eval  Hip flexion    Hip extension    Hip abduction    Hip adduction    Hip internal rotation    Hip external rotation    Knee flexion    Knee extension    Ankle dorsiflexion 8 15  Ankle plantarflexion     Ankle inversion    Ankle eversion     (Blank rows = not tested)  LOWER EXTREMITY MMT:    MMT Right Eval Left Eval  Hip flexion 4 5  Hip extension    Hip abduction 3 5  Hip adduction 4 5  Hip internal rotation    Hip external rotation    Knee flexion 4 5  Knee extension 4 5  Ankle dorsiflexion 3+ 5  Ankle plantarflexion    Ankle inversion    Ankle eversion    (Blank rows = not tested)  BED MOBILITY:  Not tested  TRANSFERS: Independent Floor to stand: NT    CURB:  Findings: CGA  STAIRS: Findings: Level of Assistance: SBA, Number of Stairs: 6, Height of Stairs: 4-6"   , and Comments: BHR GAIT: Findings: Distance walked: 150, Level of assistance: Modified independence, and Comments: right lateral trunk lean in stance phase  FUNCTIONAL TESTS:  5 times sit to stand: 18.60 sec Timed up and go (TUG): 11.62 sec; TUG cog: 20.75 sec Berg Balance Scale: 43/56 10 meter walk test: 12.22 sec = 2.68 ft/sec  M-CTSIB  Condition 1: Firm Surface, EO 30 Sec, Mild Sway  Condition 2: Firm Surface, EC 30 Sec, Mild and Moderate Sway  Condition 3: Foam Surface, EO 30 Sec, Mild and Moderate Sway  Condition 4: Foam Surface, EC 20 Sec, Moderate and Severe Sway  TREATMENT DATE: 03/20/24      GOALS: Goals reviewed with patient? Yes  SHORT TERM GOALS: Target date: 04/10/2024    Patient will be independent in HEP to improve functional outcomes Baseline: handout was provided and HEP reviewed 04/09/24 Goal status: MET 04/09/24  2.  Demo improved BLE strength and reduced risk for falls per time 15 sec 5xSTS test Baseline: 18 sec; 14.93 sec without UEs 04/09/24  Goal status: MET 04/09/24   3.  Improve safety and access to home environment navigating flight of stairs modified independence Baseline: SBA; Alternating reciprocal pattern with and without rail  with good stability 04/09/24 Goal status: MET 04/09/24  4.  Improve proprioceptive awareness and postural stability for improved safety in ADL by mild sway condition 4 M-CTSIB Baseline: mod-severe x 20 sec; 16 sec L LOB 04/09/24 Goal status: IN PROGRESS 04/09/24    LONG TERM GOALS: Target date: 05/01/2024    Demo 10% or less difference TUG test with cognitive dual-task component to improve safety with mobility Baseline: 78% (TUG reg 11.62; TUG cog 20.75 sec) Goal status: INITIAL  2.  Demo low risk for falls per score 52/56 Berg Balance Test Baseline: 43/56 Goal status: INITIAL  3.  Increase gait speed to 3.0 ft/sec to improve efficiency of community ambulation Baseline: 2.68 ft/sec Goal status: INITIAL  4.  Demo independent ambulation community level distances over various surfaces to improve safety in environment Baseline: modified indep level, supervision-SBA uneven Goal status: INITIAL  5.  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 Tolerated activities of increased demand and complexity well with HR not exceeding 111 bpm during moderate exertion tasks.  Exhibits some limited left ankle control during mobility, especially when carrying weight requiring a but of circumduction but no instances of tripping or stumbling and pt denies this at home.  Progressing w/ POC details and will check in to see how his return to work was and if any focal areas that need addressing OBJECTIVE IMPAIRMENTS: Abnormal gait, decreased activity tolerance, decreased balance, decreased coordination, decreased endurance, decreased mobility, difficulty walking, decreased ROM, and decreased strength.   ACTIVITY LIMITATIONS:  carrying, lifting, bending, squatting, stairs, reach over head, and locomotion level  PARTICIPATION LIMITATIONS: meal prep, cleaning, laundry, interpersonal relationship, driving, shopping, community activity, occupation, and yard work  PERSONAL FACTORS: Age, Time since onset of injury/illness/exacerbation, and 1-2 comorbidities: hx of hydrocephalus, HTN are also affecting patient's functional outcome.   REHAB POTENTIAL: Excellent  CLINICAL DECISION MAKING: Evolving/moderate complexity  EVALUATION COMPLEXITY: Moderate  PLAN:  PT FREQUENCY: 2x/week  PT DURATION: 6 weeks  PLANNED INTERVENTIONS: 97750- Physical Performance Testing, 97110-Therapeutic exercises, 97530- Therapeutic activity, [id number]- Neuromuscular re-education, 97535- Self Care, 16109- Manual therapy, [id number]- Gait training, [phone]- Orthotic Initial, [phone]- Orthotic/Prosthetic subsequent, [phone]- Aquatic Therapy, and DME instructions  PLAN FOR NEXT SESSION: Work on stamina, balance, endurance; check HEP additions; work towards LTGs.  How did work shift go?   10:18 AM, 04/22/24 M. Kelly Bralen Wiltgen, PT, DPT Physical Therapist- [employer] Office Number: [phone]

## 2024-04-24 ENCOUNTER — Ambulatory Visit: Admitting: Occupational Therapy

## 2024-04-24 ENCOUNTER — Ambulatory Visit

## 2024-04-24 ENCOUNTER — Other Ambulatory Visit: Payer: Self-pay

## 2024-04-24 DIAGNOSIS — I69118 Other symptoms and signs involving cognitive functions following nontraumatic intracerebral hemorrhage: Secondary | ICD-10-CM

## 2024-04-24 DIAGNOSIS — R41841 Cognitive communication deficit: Secondary | ICD-10-CM

## 2024-04-24 DIAGNOSIS — R2681 Unsteadiness on feet: Secondary | ICD-10-CM

## 2024-04-24 DIAGNOSIS — R262 Difficulty in walking, not elsewhere classified: Secondary | ICD-10-CM

## 2024-04-24 DIAGNOSIS — R278 Other lack of coordination: Secondary | ICD-10-CM

## 2024-04-24 DIAGNOSIS — M6281 Muscle weakness (generalized): Secondary | ICD-10-CM

## 2024-04-24 DIAGNOSIS — R2689 Other abnormalities of gait and mobility: Secondary | ICD-10-CM

## 2024-04-24 DIAGNOSIS — I69119 Unspecified symptoms and signs involving cognitive functions following nontraumatic intracerebral hemorrhage: Secondary | ICD-10-CM

## 2024-04-24 NOTE — Therapy (Signed)
 OUTPATIENT OCCUPATIONAL THERAPY NEURO  Treatment Note  Patient Name: Micheal Ayers MRN: [id number] DOB:12/01/1960, 63 y.o., male Today's Date: 04/24/2024  PCP: Olin Bertin, MD REFERRING PROVIDER: Zelda Hickman, PA-C  END OF SESSION:  OT End of Session - 04/24/24 1027     Visit Number 11    Number of Visits 13    Date for OT Re-Evaluation 05/02/24    Authorization Type [employer] - 60 visits combined (OT/PT/ST)    OT Start Time [id number]    OT Stop Time 1013    OT Time Calculation (min) 40 min    Activity Tolerance Patient tolerated treatment well;Patient limited by fatigue    Behavior During Therapy WFL for tasks assessed/performed                       Past Medical History:  Diagnosis Date   ADD (attention deficit disorder)    Allergic rhinitis, cause unspecified    Chronic kidney disease    Acute renal failure   Hemochromatosis, hereditary (HCC)    seen at [facility]   Hypertension    Major depressive disorder    Stroke [facility])    Past Surgical History:  Procedure Laterality Date   CRANIOTOMY Left 02/24/2024   Procedure: CRANIOTOMY HEMATOMA EVACUATION SUBDURAL;  Surgeon: Joaquin Mulberry, MD;  Location: [facility] OR;  Service: Neurosurgery;  Laterality: Left;   elbow surgury  1969   left elbow   INGUINAL HERNIA REPAIR  2003   left   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/2023   Procedure: Shunt Placment - right occipital;  Surgeon: Agustina Aldrich, MD;  Location: [facility] OR;  Service: Neurosurgery;  Laterality: Right;   Patient Active Problem List   Diagnosis Date Noted   Hyponatremia 03/17/2024   GERD (gastroesophageal reflux disease) 03/17/2024   Anxiety disorder with panic attacks 03/17/2024   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 03/11/2024   Subdural hematoma (HCC) 02/24/2024   S/P craniotomy 02/24/2024   Obstructive hydrocephalus (HCC) 03/13/2023   Ischemic cerebrovascular accident (CVA) (HCC) 02/19/2023   Cerebral ventriculomegaly  02/19/2023   Acute renal failure superimposed on stage 2 chronic kidney disease (HCC) 02/19/2023   Hypertensive urgency 02/19/2023   Abnormal CXR 02/19/2023   CVA (cerebral vascular accident) (HCC) 02/19/2023   Fatigue 09/20/2012   Family history of hemochromatosis 09/20/2012   Preventative health care 09/06/2012   ADD (attention deficit disorder)    Allergic rhinitis     ONSET DATE: 02/24/24  REFERRING DIAG: F62.130 (ICD-10-CM) - 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  Pt reports that into the afternoon as he got tired, he felt that he would "pitch forward" sometimes.  Pt spoke with his supervisor about his apprehension with going up on a ladder at this time - reports supervisor has been "more than accommodating".    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 He was found to have massive left SDH with signs of septations and small subacute  right parafalcine SDH with midline shift. He was evaluated by Dr. Rochelle Chu and underwent left fronto-temporoparietal crani for evacuation of large chronic LDH the same day. He was placed on decadron  and on Keppra  for seizure prophylaxis and serial CT head showed increaseing post op pneumocephalus with residual L-SDH decreased to 6-8 mm from 12 mm and resolution of medline  shift.  PRECAUTIONS: None  WEIGHT BEARING RESTRICTIONS: No  PAIN:  Are you having pain? No  FALLS: Has patient fallen in last 6 months? 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  HAND DOMINANCE: Right  ADLs: Overall ADLs: Mod I - reports more time consuming overall, mild difficulty with getting in/out of shower but no physical assistance Transfers/ambulation related to ADLs: Mod I - using RW 50% of time in the home and when walking in the cul-de-sac Equipment: Grab bars, Walk in shower, and hand held shower head  IADLs: Light housekeeping: is not currently performing and wife did majority even prior Meal Prep: is not currently performing and wife did majority even prior Community mobility: not currently cleared to drive  Medication management: spouse is assisting pt with taking meds appropriately Financial management: spouse is managing bills at this time Handwriting: 100% legible completed PPT #1([place]): 11.66 sec  MOBILITY STATUS: slower and decreased endurance  POSTURE COMMENTS:  No Significant postural limitations  ACTIVITY TOLERANCE: Activity tolerance: diminished  FUNCTIONAL OUTCOME MEASURES: PSFS: 3.0   04/24/24   UPPER EXTREMITY ROM:  WFL bilaterally  UPPER EXTREMITY MMT:   grossly 4/5 bilaterally  COORDINATION: Finger Nose Finger test: mild dysmetria on R and  mildly slower compared to L 9 Hole Peg test: Right: 24.53 sec; Left: 29.06 sec Box and Blocks:  Right 45 blocks, Left 43 blocks - frequently picking up 2 blocks but able to recall to only pick up one at a time RAM: pt with decreased motor control of RUE with rapid hand movements, demonstrating "tremor-like" movement on R  SENSATION: WFL  COGNITION: Overall cognitive status: No family/caregiver present to determine baseline cognitive functioning Brief Interview for Mental Status:   Memory:  Word Repetition:   "Sock"  "Blue"  "Bed"   Orientation Level:    Year:   2025  Month: April  Day of Week: Thursday  Memory:  Recall: Able to recall Blue and bed, unable to recall sock even when given a cue.      VISION: Subjective report: "I can't see far away" no changes Baseline vision: Wears glasses for distance only   VISION ASSESSMENT: Ocular ROM: WFL Gaze preference/alignment: WDL Tracking/Visual pursuits: Able to track stimulus in all quads without difficulty Saccades: WFL Visual Fields: no apparent deficits  OBSERVATIONS: Pt requiring increased time to answer questions and somewhat tangential when asked questions about pt impairments, possibly demonstrating decreased awareness vs decreased memory.  Pt requiring increased time with 9 hole peg test and Box and blocks, most likely due to overall slowed  processing as pt reports "everything is a process".                                                                                                                           TREATMENT DATE:  04/24/24 Return to work: pt reports utilizing a list at work to allow for increased sequencing, especially if he has multiple tasks to complete.  Pt recognizes that things were slower the last couple of days at work, potentially due to the weather, which is allowing him an easier transition back to work. Pt reports concerns with balance with aspects of work, continuing to work on balance and endurance  with PT.  Pt reports supervisor is accommodating of pt's physical limitations (getting on/off ladder), therapy appts, and possible need to modify his lunch/break schedule. Medications: pt reports that he has transitioned to having his meds in the bathroom (per his previous routine) which has allowed for increased recall and independence.  Pt stating that he ran out of one of his medications over the weekend and there is no refill on the prescription.  OT encouraged pt to f/u with MD from [facility] or PCP to question if that is one that he needs to continue taking or if it is no longer necessary.  Of note, medication is for constipation which he reports that he is not having issues with. Pill box assessment: pt completed pill box test with 100% accuracy, in 6:20 minutes.  Pt with no errors, demonstrates good multi-tasking and problem solving throughout task. Around 5 minute mark, pt reports that he has been removing pills one at a time from pill bottle vs pouring into palm of hand, which could have resulted in quicker time. Functional cognition further assessed with The Pillbox Test: A Measure of Executive Functioning and Estimate of Medication Management. A straight pass/fail designation is determined by 3 or more errors of omission or misplacement on the task. The pt completed the test with 0 errors. Cognitive/motor dual tasking: ambulated around therapy gym to pick up colored cones from floor to challenge balance, picking up items from floor, and memory.  OT challenged pt to name categorical items based on color of cones with pt demonstrating good use of association to recall category with color and backwards chaining to recall category by process of elimination.  OT reiterating use of list if greater number of items or tasks to recall as well as focus on one task at a time as able to increase recall and carryover.    04/22/24 Coordination/cognition: engaged in small peg board pattern replication with focus  on coordination and attention to task.  Pt completing with good attention to task and no distraction in moderately distracting environment.  OT had pt complete additional pattern while naming items within category to further challenge alternating attention. Pt initially skipping pegs to complete task in grouping to increase recall.  OT encouraged pt to still engage in some alternating of colors to further challenge  memory.  OT reiterated memory strategies with "WARM" and functional carryover with writing down sequencing of items or list to increase recall when running errands or system when returning to work.  Planning/organizing: engaged in complex planning/problem solving prompt requiring pt to organize and plan for meal, to include making a menu and considering amounts and timing.  OT also then providing additional information with money amounts to further challenge mental math and decision making.  Pt utilizing pen and paper to aid in list making for recall and to alter amounts during mostly mental math. RTW: engaged in review of previous education with taking rest breaks, focusing on completing one task at a time, and use of mental vs physical rest breaks as needed to allow pt to engage in active rest if able.  Pt reporting still somewhat "anxious" about the unknown and his expectations of himself but his supervisor has been supportive of his return to work.  PATIENT EDUCATION: Education details: ongoing condition specific education, see above for education on memory strategies and sequencing as needed for return to work. Person educated: Patient Education method: Explanation and Handouts Education comprehension: needs further education  HOME EXERCISE PROGRAM: 03/25/24 - memory strategies and coordination (see pt instructions) 04/16/24 - energy conservation strategies (see pt instructions)   GOALS: Goals reviewed with patient? Yes  SHORT TERM GOALS: Target date: 04/11/24  Pt will be independent  with fine and gross motor control HEP with use of visual handouts. Baseline: slower overall with fine and gross motor tasks Goal status: in progress  2.  Pt will verbalize understanding of task modifications and/or potential AE needs to increase ease, safety, and independence w/ ADLs and IADLs. Baseline: limited engagement in IADLs, slower overall with ADLs Goal status: MET  3.  Pt will demonstrate improved memory, sequencing, and problem solving as demonstrated by improvements in score on pill box assessment by ability to complete in 5 mins time limit with </= to 2 errors.  Baseline: wife managing meds/ TBD 03/25/24 - no errors, but required 6:39 to complete 04/07/24 - no errors, completed in 5:34 Goal status: Partially met  4.  Pt will verbalize understanding of energy conservation strategies.  Baseline: decreased endurance  Goal status: in progress - initiated on 04/09/24   LONG TERM GOALS: Target date: 05/02/24  Pt will demonstrate understanding of memory compensations and ways to keep thinking skills sharp Baseline:  Goal status:  in progress  2.  Pt will perform dynamic standing task for 10 mins as needed to complete simple IADLs w/o LOB using DME and/or countertop support prn. Baseline: decreased engagement in IADLs Goal status:  in progress  3.  Pt will demonstrate ability to sequence simple functional task (simple snack prep, laundry task, etc) at Mod I level with good safety awareness and ability to utilize memory strategies for recall.  Baseline: decreased engagement in IADLs Goal status:  in progress  4.  Pt will navigate a moderately busy environment, completing dual task activity and/or following multi-step commands with 90% accuracy Baseline: impaired memory and thinking strategies and endurance impacting return to work Goal status:  in progress  5.  Pt will be independent with utilization of memory strategies to allow for increased independence with managing personal  appointments and in preparation for return to work. Baseline: decreased memory and thinking strategies Goal status:  in progress  6.  Patient will report at least two-point increase in average PSFS score or at least three-point increase in a single activity score indicating  functionally significant improvement given minimum detectable change. Baseline: 3.0 04/24/24: 7.7 Goal status:  MET  ASSESSMENT:  CLINICAL IMPRESSION: Pt tolerated tasks well throughout session.  Pt with no c/o of lightheadness when looking up/down and retrieving items from floor.  Pt demonstrating improvements in recall with categorical naming and use of memory strategies, particularly association to recall. Pt is showing great progress towards goals, return to work should increase confidence in many areas as well.  Pt will continue to benefit from focus on memory, especially short term memory and establishing a process or routine to increase recall and allow safe return to work.   PERFORMANCE DEFICITS: in functional skills including ADLs, IADLs, coordination, pain, Fine motor control, Gross motor control, balance, body mechanics, endurance, decreased knowledge of precautions, and UE functional use, cognitive skills including emotional, energy/drive, memory, safety awareness, and thought, and psychosocial skills including coping strategies and routines and behaviors.     PLAN:  OT FREQUENCY: 2x/week  OT DURATION: 6 weeks  PLANNED INTERVENTIONS: 97168 OT Re-evaluation, 97535 self care/ADL training, 40981 therapeutic exercise, 97530 therapeutic activity, 97112 neuromuscular re-education, functional mobility training, visual/perceptual remediation/compensation, psychosocial skills training, energy conservation, coping strategies training, patient/family education, and DME and/or AE instructions  RECOMMENDED OTHER SERVICES: may benefit from SLP, TBD after additional session and assessment of SLUMS  CONSULTED AND AGREED  WITH PLAN OF CARE: Patient  PLAN FOR NEXT SESSION:   Reiterate energy conservation and pacing during functional tasks  Cognitive/motor dual tasking while naming items in category with mobility/ball toss, incorporate picking up/carrying heavy items? - note: avoid frequent turns   Anthonette Kinsman, OTR/L 04/24/2024, 10:27 AM   [facility] at [facility] [address], [address] [place], [place] [address] Phone # [phone] Fax # [phone]

## 2024-04-24 NOTE — Therapy (Signed)
 OUTPATIENT PHYSICAL THERAPY NEURO TREATMENT   Patient Name: Micheal Ayers MRN: [id number] DOB:11-Dec-1960, 63 y.o., male Today's Date: 04/24/2024   PCP: Olin Bertin, MD REFERRING PROVIDER: Zelda Hickman, PA-C  END OF SESSION:  PT End of Session - 04/24/24 0844     Visit Number 11    Number of Visits 13    Date for PT Re-Evaluation 05/01/24    Authorization Type [employer]    Authorization Time Period 60 visit limit combined    Authorization - Visit Number 10    Authorization - Number of Visits 30    PT Start Time 0845    PT Stop Time 0930    PT Time Calculation (min) 45 min    Equipment Utilized During Treatment Gait belt    Activity Tolerance Patient tolerated treatment well    Behavior During Therapy WFL for tasks assessed/performed                Past Medical History:  Diagnosis Date   ADD (attention deficit disorder)    Allergic rhinitis, cause unspecified    Chronic kidney disease    Acute renal failure   Hemochromatosis, hereditary (HCC)    seen at [facility]   Hypertension    Major depressive disorder    Stroke [facility])    Past Surgical History:  Procedure Laterality Date   CRANIOTOMY Left 02/24/2024   Procedure: CRANIOTOMY HEMATOMA EVACUATION SUBDURAL;  Surgeon: Joaquin Mulberry, MD;  Location: [facility] OR;  Service: Neurosurgery;  Laterality: Left;   elbow surgury  1969   left elbow   INGUINAL HERNIA REPAIR  2003   left   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/2023   Procedure: Shunt Placment - right occipital;  Surgeon: Agustina Aldrich, MD;  Location: [facility] OR;  Service: Neurosurgery;  Laterality: Right;   Patient Active Problem List   Diagnosis Date Noted   Hyponatremia 03/17/2024   GERD (gastroesophageal reflux disease) 03/17/2024   Anxiety disorder with panic attacks 03/17/2024   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 03/11/2024   Subdural hematoma (HCC) 02/24/2024   S/P craniotomy 02/24/2024   Obstructive hydrocephalus (HCC)  03/13/2023   Ischemic cerebrovascular accident (CVA) (HCC) 02/19/2023   Cerebral ventriculomegaly 02/19/2023   Acute renal failure superimposed on stage 2 chronic kidney disease (HCC) 02/19/2023   Hypertensive urgency 02/19/2023   Abnormal CXR 02/19/2023   CVA (cerebral vascular accident) (HCC) 02/19/2023   Fatigue 09/20/2012   Family history of hemochromatosis 09/20/2012   Preventative health care 09/06/2012   ADD (attention deficit disorder)    Allergic rhinitis     ONSET DATE: 02/19/24  REFERRING DIAG:  U13.244 (ICD-10-CM) - 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 Workplace accommodations are good  Pt accompanied by: self  PERTINENT HISTORY: 63 y.o. male with history of CVA 01/2023, obstructive hydrocephalus status post VP shunt 02/2023, chronic/GAD, panic attacks, low back to 02/24/2024 with 2-day history of right facial droop and difficulty DH with signs of septations and small subacute right parafalcine SDH with midline shift.  He underwent left for evacuation of bleed by Dr. Rochelle Chu.  Postop placed on Decadron  as well as Keppra  for seizure prophylaxis.  Follow-up CT showed resolution of midline shift Decadron  was discharged.  Therapy was working with patient who was noted to have balance deficits with decreasing coordination on right, difficulty weightbearing through RLE as well as tendency to drift when walking.  He was independent working prior to  admission.  CIR was recommended due to functional decline.  PAIN:  Are you having pain? Yes: NPRS scale: 3/10 Pain location: B LEs Pain description: aching, irritating  Aggravating factors:   Relieving factors:    PRECAUTIONS: Other:  Special Instructions: No driving, strenuous activity or return to work till cleared by MD. *04/16/2024:  Has been cleared to drive* Recommend repeat check BMET in 1-2 weeks to monitor sodium levels  RED FLAGS: None   WEIGHT BEARING RESTRICTIONS: No  FALLS: Has patient fallen in last 6 months? No  LIVING ENVIRONMENT: Lives with: lives with their family and lives with their spouse Lives in: House/apartment Stairs: Yes: Internal: flight steps; can reach both Has following equipment at home: Otho Blitz - 2 wheeled Master on main floor. PLOF: Independent,   PATIENT GOALS: Improve balance, walking, and mobility  OBJECTIVE:   TODAY'S TREATMENT: 04/24/24 Activity Comments  Work place simulation -Surveyor, mining and placing various items overhead on shelf.  -pulling/pushing weights x 2 min -step-up to overhead press w/ medball x 2 min -carrying 48# box x 90 ft x 2 reps -   balance Static multisensory balance mild-moderate postural perturbations                     Access Code: [email or web address] URL: [email or web address] Date: 04/16/2024 Prepared by: [facility]patient  Rehab - [facility]  Exercises - Corner Balance Feet Together With Eyes Closed  - 1 x daily - 7 x weekly - 3 sets - 30 sec hold - Corner Balance Feet Together: Eyes Open With Head Turns  - 1 x daily - 7 x weekly - 3 sets - 30 sec hold - Corner Balance Feet Together: Eyes Closed With Head Turns  - 1 x daily - 7 x weekly - 3 sets - 30 sec hold - Sit to Stand in Stride with AFO and UE Assist in LE Alignment  - 1 x daily - 7 x weekly - 3 sets - 10 reps - Standing Foot Lift on Box (BKA)  - 1 x daily - 7 x weekly - 2-3 sets - 10 reps - Side Stepping with Counter  Support  - 2 x daily - 7 x weekly - 2-3 min hold - Backward Walking with Counter Support  - 2 x daily - 7 x weekly - 2-3 min hold - Forward and Backward Monster Walk with Resistance at Ankles and Counter Support  - 2 x daily - 7 x weekly - 2-3 min hold - Tandem Walking with Counter Support  - 7 x weekly - 2 min hold   PATIENT EDUCATION: Education details: Additions to HEP Person educated: Patient Education method: Explanation, Demonstration, Tactile cues, Verbal cues, and Handouts Education comprehension: verbalized understanding and returned demonstration   ----------------------------------------------------------  Note: Objective measures were completed at Evaluation unless otherwise noted.  DIAGNOSTIC FINDINGS:  IMPRESSION: 1. Increasing postoperative Pneumocephalus, now affecting both anterior frontal lobes (see series 4, image 17). 2. Subdural drain removed with interval increased residual Left Subdural Hematoma; 12 mm maximal thickness now vs 6-8 mm on 02/25/2022. Small volume para falcine SDH unchanged. 3. No significant midline shift. Stable basilar cisterns. Stable CSF shunt and ventriculomegaly.  COGNITION: Overall cognitive status: History of cognitive impairments - at baseline per pt report since onset of hydrocephalus having increased difficulty with conversation and recall   SENSATION: WFL  COORDINATION: Difficulty with rapid alternating movements on right Finger to nose WNL Heel to shin WNL  EDEMA:  none  MUSCLE TONE: WNL    POSTURE: No Significant postural limitations  LOWER EXTREMITY ROM:     Active  Right Eval Left Eval  Hip flexion    Hip extension    Hip abduction    Hip adduction    Hip internal rotation    Hip external rotation    Knee flexion    Knee extension    Ankle dorsiflexion 8 15  Ankle plantarflexion    Ankle inversion    Ankle eversion     (Blank rows = not tested)  LOWER EXTREMITY MMT:    MMT Right Eval Left Eval   Hip flexion 4 5  Hip extension    Hip abduction 3 5  Hip adduction 4 5  Hip internal rotation    Hip external rotation    Knee flexion 4 5  Knee extension 4 5  Ankle dorsiflexion 3+ 5  Ankle plantarflexion    Ankle inversion    Ankle eversion    (Blank rows = not tested)  BED MOBILITY:  Not tested  TRANSFERS: Independent Floor to stand: NT    CURB:  Findings: CGA  STAIRS: Findings: Level of Assistance: SBA, Number of Stairs: 6, Height of Stairs: 4-6"   , and Comments: BHR GAIT: Findings: Distance walked: 150, Level of assistance: Modified independence, and Comments: right lateral trunk lean in stance phase  FUNCTIONAL TESTS:  5 times sit to stand: 18.60 sec Timed up and go (TUG): 11.62 sec; TUG cog: 20.75 sec Berg Balance Scale: 43/56 10 meter walk test: 12.22 sec = 2.68 ft/sec  M-CTSIB  Condition 1: Firm Surface, EO 30 Sec, Mild Sway  Condition 2: Firm Surface, EC 30 Sec, Mild and Moderate Sway  Condition 3: Foam Surface, EO 30 Sec, Mild and Moderate Sway  Condition 4: Foam Surface, EC 20 Sec, Moderate and Severe Sway                                                                                                                                  TREATMENT DATE: 03/20/24      GOALS: Goals reviewed with patient? Yes  SHORT TERM GOALS: Target date: 04/10/2024    Patient will be independent in HEP to  improve functional outcomes Baseline: handout was provided and HEP reviewed 04/09/24 Goal status: MET 04/09/24  2.  Demo improved BLE strength and reduced risk for falls per time 15 sec 5xSTS test Baseline: 18 sec; 14.93 sec without UEs 04/09/24  Goal status: MET 04/09/24   3.  Improve safety and access to home environment navigating flight of stairs modified independence Baseline: SBA; Alternating reciprocal pattern with and without rail with good stability 04/09/24 Goal status: MET 04/09/24  4.  Improve proprioceptive awareness and postural stability for  improved safety in ADL by mild sway condition 4 M-CTSIB Baseline: mod-severe x 20 sec; 16 sec L LOB 04/09/24 Goal status: IN PROGRESS 04/09/24    LONG TERM GOALS: Target date: 05/01/2024    Demo 10% or less difference TUG test with cognitive dual-task component to improve safety with mobility Baseline: 78% (TUG reg 11.62; TUG cog 20.75 sec) Goal status: INITIAL  2.  Demo low risk for falls per score 52/56 Berg Balance Test Baseline: 43/56 Goal status: INITIAL  3.  Increase gait speed to 3.0 ft/sec to improve efficiency of community ambulation Baseline: 2.68 ft/sec Goal status: INITIAL  4.  Demo independent ambulation community level distances over various surfaces to improve safety in environment Baseline: modified indep level, supervision-SBA uneven Goal status: INITIAL  5.  Independent floor to stand transfers for improved functional mobility Baseline: NT Goal status: INITIAL    ASSESSMENT:  CLINICAL IMPRESSION: Pt notes overall good performance at work other than onset of fatigue and foot drag by end of day.  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 Heavy work activities and time-based activities to meet demands of more constant work load. Demo good balance and safety awareness throughout activities and no adverse response.  Multisensory balance to improve stability/facilitate reaction to reduce risk for falls OBJECTIVE IMPAIRMENTS: Abnormal gait, decreased activity tolerance, decreased balance, decreased coordination, decreased endurance, decreased mobility, difficulty walking, decreased ROM, and decreased strength.   ACTIVITY LIMITATIONS: carrying, lifting, bending, squatting, stairs, reach over head, and locomotion level  PARTICIPATION LIMITATIONS: meal prep, cleaning, laundry, interpersonal relationship, driving, shopping, community activity, occupation, and yard work  PERSONAL FACTORS: Age, Time  since onset of injury/illness/exacerbation, and 1-2 comorbidities: hx of hydrocephalus, HTN are also affecting patient's functional outcome.   REHAB POTENTIAL: Excellent  CLINICAL DECISION MAKING: Evolving/moderate complexity  EVALUATION COMPLEXITY: Moderate  PLAN:  PT FREQUENCY: 2x/week  PT DURATION: 6 weeks  PLANNED INTERVENTIONS: 97750- Physical Performance Testing, 97110-Therapeutic exercises, 97530- Therapeutic activity, [id number]- Neuromuscular re-education, 97535- Self Care, 35573- Manual therapy, [id number]- Gait training, [id number]- Orthotic Initial, [id number]- Orthotic/Prosthetic subsequent, [phone]- Aquatic Therapy, and DME instructions  PLAN FOR NEXT SESSION: preparing for D/C   8:45 AM, 04/24/24 M. Kelly Nhyla Nappi, PT, DPT Physical Therapist- [place] Office Number: [phone]

## 2024-04-24 NOTE — Therapy (Signed)
 OUTPATIENT SPEECH LANGUAGE PATHOLOGY EVALUATION   Patient Name: Micheal Ayers MRN: [id number] DOB:March 22, 1961, 63 y.o., male Today's Date: 04/24/2024  PCP: Olin Bertin, MD REFERRING PROVIDER: Bea Lime, DO  END OF SESSION:  End of Session - 04/24/24 2113     Visit Number 1    Number of Visits 1    Date for SLP Re-Evaluation 04/24/24    SLP Start Time 0805    SLP Stop Time  0845    SLP Time Calculation (min) 40 min    Activity Tolerance Patient tolerated treatment well             Past Medical History:  Diagnosis Date   ADD (attention deficit disorder)    Allergic rhinitis, cause unspecified    Chronic kidney disease    Acute renal failure   Hemochromatosis, hereditary (HCC)    seen at [facility]   Hypertension    Major depressive disorder    Stroke [facility])    Past Surgical History:  Procedure Laterality Date   CRANIOTOMY Left 02/24/2024   Procedure: CRANIOTOMY HEMATOMA EVACUATION SUBDURAL;  Surgeon: Joaquin Mulberry, MD;  Location: [facility] OR;  Service: Neurosurgery;  Laterality: Left;   elbow surgury  1969   left elbow   INGUINAL HERNIA REPAIR  2003   left   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/2023   Procedure: Shunt Placment - right occipital;  Surgeon: Agustina Aldrich, MD;  Location: [facility] OR;  Service: Neurosurgery;  Laterality: Right;   Patient Active Problem List   Diagnosis Date Noted   Hyponatremia 03/17/2024   GERD (gastroesophageal reflux disease) 03/17/2024   Anxiety disorder with panic attacks 03/17/2024   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 03/11/2024   Subdural hematoma (HCC) 02/24/2024   S/P craniotomy 02/24/2024   Obstructive hydrocephalus (HCC) 03/13/2023   Ischemic cerebrovascular accident (CVA) (HCC) 02/19/2023   Cerebral ventriculomegaly 02/19/2023   Acute renal failure superimposed on stage 2 chronic kidney disease (HCC) 02/19/2023   Hypertensive urgency 02/19/2023   Abnormal CXR 02/19/2023   CVA (cerebral  vascular accident) (HCC) 02/19/2023   Fatigue 09/20/2012   Family history of hemochromatosis 09/20/2012   Preventative health care 09/06/2012   ADD (attention deficit disorder)    Allergic rhinitis     ONSET DATE: 02/24/24   REFERRING DIAG: Y86.578 (ICD-10-CM) - 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 male with history of CVA 01/2023, obstructive hydrocephalus status post VP shunt 02/2023, chronic/GAD, panic attacks, low back to 02/24/2024 with 2-day history of right facial droop and difficulty DH with signs of septations and small subacute right parafalcine SDH with midline shift. He underwent left for evacuation of bleed by Dr. Rochelle Chu. Postop placed on Decadron  as well as Keppra  for seizure prophylaxis. Follow-up CT showed resolution of midline shift Decadron  was discharged.  PAIN:  Are you having pain? No  FALLS: Has patient fallen in last 6 months?  No  LIVING ENVIRONMENT: Lives with: lives with their spouse Lives in: House/apartment  PLOF:  Level of assistance: Independent with ADLs, Independent with IADLs Employment: Full-time employment  PATIENT GOALS: Improve in all areas  OBJECTIVE:  Note: Objective measures were completed at Evaluation unless otherwise noted.  DIAGNOSTIC FINDINGS:  Date  of Discharge from SLP service:March 12, 2024   Today's Date: 03/12/2024 SLP Individual Time: 1330-1430 SLP Individual Time Calculation (min): 60 min   Clinical Impression/Discharge Summary:  Pt has made excellent progress this stay as evidenced by mastery of 5/5 goals and improved executive functioning/specific word finding. He  also demonstrates improving airway protection and breath/swallow coordination. He independently completes his EMST protocol and demonstrates understanding to complete protocol in entirety at home. Continued ST services are not warranted as pt completes complex cognitive tasks @ modI overall. Additionally, the pt and his wife verbalized understanding of all education provided.   COGNITION: Overall cognitive status: Impaired Areas of impairment:  Memory: Impaired: Short term Functional deficits: Pt stated he had more difficulty at work recalling things earlier in the day. He is using more lists at work, and is writing down computer shortcuts/commands to recall and use later. He also uses a work and a home calendar. Lastly, he puts appointment cards on his mirror after he writes down on the calendar. OT has worked on pt's cognition during their latest and present course of therapy  COGNITIVE COMMUNICATION: Following directions: Follows multi-step commands with increased time  Auditory comprehension: WFL Verbal expression: WFL Functional communication: WFL  ORAL MOTOR EXAMINATION: Overall status: WFL  STANDARDIZED ASSESSMENTS: SLUMS: Pt scored 26/30 on the SLUMS indicating mild cognitive impairment. Pt had difficulty with working memory, as well as with recall of 5 random words.  PATIENT REPORTED OUTCOME MEASURES (PROM): N/A                                                                                                                            TREATMENT DATE: n/a    PATIENT EDUCATION: Education details: results of ST evaluation, SLP does not think ST would add anything to pt's current therapy course given OT is targeting cognition. Person educated: Patient Education method: Explanation Education comprehension: verbalized understanding    ASSESSMENT:  CLINICAL IMPRESSION: Patient is a 63 y.o. M who was seen today for assessment of cognitive communication skills in light of  CVA/SDH. Pt scored 26/30, in the high-mild cognitive disorder range. Pt agreed OT has handled cognition well to this point and he would like to cont with them without adding any additional therapies.   OBJECTIVE IMPAIRMENTS: include memory. These impairments are limiting patient from household responsibilities and effectively communicating at home and in community.  PLAN:   One-time visit only     Antonio Creswell, CCC-SLP 04/24/2024, 9:16 PM

## 2024-04-26 ENCOUNTER — Other Ambulatory Visit: Payer: Self-pay | Admitting: Adult Health

## 2024-04-26 DIAGNOSIS — F902 Attention-deficit hyperactivity disorder, combined type: Secondary | ICD-10-CM

## 2024-04-28 ENCOUNTER — Ambulatory Visit: Attending: Physical Medicine and Rehabilitation

## 2024-04-28 ENCOUNTER — Ambulatory Visit: Admitting: Occupational Therapy

## 2024-04-28 DIAGNOSIS — M6281 Muscle weakness (generalized): Secondary | ICD-10-CM | POA: Insufficient documentation

## 2024-04-28 DIAGNOSIS — R41841 Cognitive communication deficit: Secondary | ICD-10-CM | POA: Insufficient documentation

## 2024-04-28 DIAGNOSIS — I69119 Unspecified symptoms and signs involving cognitive functions following nontraumatic intracerebral hemorrhage: Secondary | ICD-10-CM | POA: Diagnosis present

## 2024-04-28 DIAGNOSIS — R2681 Unsteadiness on feet: Secondary | ICD-10-CM | POA: Diagnosis present

## 2024-04-28 DIAGNOSIS — I69118 Other symptoms and signs involving cognitive functions following nontraumatic intracerebral hemorrhage: Secondary | ICD-10-CM

## 2024-04-28 DIAGNOSIS — R262 Difficulty in walking, not elsewhere classified: Secondary | ICD-10-CM | POA: Diagnosis present

## 2024-04-28 DIAGNOSIS — R2689 Other abnormalities of gait and mobility: Secondary | ICD-10-CM | POA: Insufficient documentation

## 2024-04-28 NOTE — Therapy (Signed)
 OUTPATIENT PHYSICAL THERAPY NEURO TREATMENT   Patient Name: Micheal Ayers MRN: [id number] DOB:1961-10-04, 63 y.o., male Today's Date: 04/28/2024   PCP: Olin Bertin, MD REFERRING PROVIDER: Zelda Hickman, PA-C  END OF SESSION:  PT End of Session - 04/28/24 1017     Visit Number 12    Number of Visits 13    Date for PT Re-Evaluation 05/01/24    Authorization Type [employer]    Authorization Time Period 60 visit limit combined    Authorization - Visit Number 11    Authorization - Number of Visits 30    PT Start Time 1015    PT Stop Time 1100    PT Time Calculation (min) 45 min    Equipment Utilized During Treatment Gait belt    Activity Tolerance Patient tolerated treatment well    Behavior During Therapy WFL for tasks assessed/performed                Past Medical History:  Diagnosis Date   ADD (attention deficit disorder)    Allergic rhinitis, cause unspecified    Chronic kidney disease    Acute renal failure   Hemochromatosis, hereditary (HCC)    seen at [facility]   Hypertension    Major depressive disorder    Stroke [facility])    Past Surgical History:  Procedure Laterality Date   CRANIOTOMY Left 02/24/2024   Procedure: CRANIOTOMY HEMATOMA EVACUATION SUBDURAL;  Surgeon: Joaquin Mulberry, MD;  Location: [facility] OR;  Service: Neurosurgery;  Laterality: Left;   elbow surgury  1969   left elbow   INGUINAL HERNIA REPAIR  2003   left   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/2023   Procedure: Shunt Placment - right occipital;  Surgeon: Agustina Aldrich, MD;  Location: [facility] OR;  Service: Neurosurgery;  Laterality: Right;   Patient Active Problem List   Diagnosis Date Noted   Hyponatremia 03/17/2024   GERD (gastroesophageal reflux disease) 03/17/2024   Anxiety disorder with panic attacks 03/17/2024   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 03/11/2024   Subdural hematoma (HCC) 02/24/2024   S/P craniotomy 02/24/2024   Obstructive hydrocephalus (HCC)  03/13/2023   Ischemic cerebrovascular accident (CVA) (HCC) 02/19/2023   Cerebral ventriculomegaly 02/19/2023   Acute renal failure superimposed on stage 2 chronic kidney disease (HCC) 02/19/2023   Hypertensive urgency 02/19/2023   Abnormal CXR 02/19/2023   CVA (cerebral vascular accident) (HCC) 02/19/2023   Fatigue 09/20/2012   Family history of hemochromatosis 09/20/2012   Preventative health care 09/06/2012   ADD (attention deficit disorder)    Allergic rhinitis     ONSET DATE: 02/19/24  REFERRING DIAG:  G26.948 (ICD-10-CM) - 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 male with history of CVA 01/2023, obstructive hydrocephalus status post VP shunt 02/2023, chronic/GAD, panic attacks, low back to 02/24/2024 with 2-day history of right facial droop and difficulty DH with signs of septations and small subacute right parafalcine SDH with midline shift.  He underwent left for evacuation of bleed by Dr. Rochelle Chu.  Postop placed on Decadron  as well as Keppra  for seizure prophylaxis.  Follow-up CT showed resolution of midline shift Decadron  was discharged.  Therapy was working with patient who was noted to have balance deficits with decreasing coordination on right, difficulty weightbearing through RLE as well as tendency to drift when walking.  He was independent working prior to admission.  CIR was recommended due to functional decline.  PAIN:  Are you having pain? Yes: NPRS  scale: 3/10 Pain location: B LEs Pain description: aching, irritating  Aggravating factors:   Relieving factors:    PRECAUTIONS: Other:  Special Instructions: No driving, strenuous activity or return to work till cleared by MD. *04/16/2024:  Has been cleared to drive* Recommend repeat check BMET in 1-2 weeks to monitor sodium levels  RED FLAGS: None   WEIGHT BEARING RESTRICTIONS: No  FALLS: Has patient fallen in last 6 months? No  LIVING ENVIRONMENT: Lives with: lives with their family and lives with their spouse Lives in: House/apartment Stairs: Yes: Internal: flight steps; can reach both Has following equipment at home: Otho Blitz - 2 wheeled Master on main floor. PLOF: Independent,   PATIENT GOALS: Improve balance, walking, and mobility  OBJECTIVE:   TODAY'S TREATMENT: 04/28/24 Activity Comments  Treadmill x 11 min -2.2 mph x 2 min for warm-up -90 sec sec dodging bags--fatigue->rest x 3 rounds HR to 117 bpm -speed intervals: 30 sec 2.2 -> 30 sec 2.9->30 sec 2.2 x 3 rounds  HR to 117 bpm  Balance  Static and dynamic on compliant surfaces. 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  -pulling/pushing weights x 2 min -step-up to overhead press w/ medball x 2 min -carrying 48# box x 90 ft x 2 reps -   balance Static multisensory balance mild-moderate postural perturbations                     Access Code: [email or web address] URL: [email or web address] Date: 04/16/2024 Prepared by: Micheal Ayers - Outpatient  Rehab - [facility]  Exercises - Corner Balance Feet Together With Eyes Closed  - 1 x daily - 7 x weekly - 3 sets - 30 sec hold - Corner Balance Feet Together: Eyes Open With Head Turns  - 1 x daily - 7 x weekly - 3 sets - 30 sec hold - Corner Balance Feet Together: Eyes Closed With Head Turns  - 1 x daily - 7 x weekly - 3 sets - 30  sec hold - Sit to Stand in Stride with AFO and UE Assist in LE Alignment  - 1 x daily - 7 x weekly - 3 sets - 10 reps - Standing Foot Lift on Box (BKA)  - 1 x daily - 7 x weekly - 2-3 sets - 10 reps - Side Stepping with Counter Support  - 2 x daily - 7 x weekly - 2-3 min hold - Backward Walking with Counter Support  - 2 x daily - 7 x weekly -  2-3 min hold - Forward and Backward Monster Walk with Resistance at Ankles and Counter Support  - 2 x daily - 7 x weekly - 2-3 min hold - Tandem Walking with Counter Support  - 7 x weekly - 2 min hold   PATIENT EDUCATION: Education details: Additions to HEP Person educated: Patient Education method: Explanation, Demonstration, Tactile cues, Verbal cues, and Handouts Education comprehension: verbalized understanding and returned demonstration   ----------------------------------------------------------  Note: Objective measures were completed at Evaluation unless otherwise noted.  DIAGNOSTIC FINDINGS:  IMPRESSION: 1. Increasing postoperative Pneumocephalus, now affecting both anterior frontal lobes (see series 4, image 17). 2. Subdural drain removed with interval increased residual Left Subdural Hematoma; 12 mm maximal thickness now vs 6-8 mm on 02/25/2022. Small volume para falcine SDH unchanged. 3. No significant midline shift. Stable basilar cisterns. Stable CSF shunt and ventriculomegaly.  COGNITION: Overall cognitive status: History of cognitive impairments - at baseline per pt report since onset of hydrocephalus having increased difficulty with conversation and recall   SENSATION: WFL  COORDINATION: Difficulty with rapid alternating movements on right Finger to nose WNL Heel to shin WNL  EDEMA:  none  MUSCLE TONE: WNL    POSTURE: No Significant postural limitations  LOWER EXTREMITY ROM:     Active  Right Eval Left Eval  Hip flexion    Hip extension    Hip abduction    Hip adduction    Hip internal rotation    Hip  external rotation    Knee flexion    Knee extension    Ankle dorsiflexion 8 15  Ankle plantarflexion    Ankle inversion    Ankle eversion     (Blank rows = not tested)  LOWER EXTREMITY MMT:    MMT Right Eval Left Eval  Hip flexion 4 5  Hip extension    Hip abduction 3 5  Hip adduction 4 5  Hip internal rotation    Hip external rotation    Knee flexion 4 5  Knee extension 4 5  Ankle dorsiflexion 3+ 5  Ankle plantarflexion    Ankle inversion    Ankle eversion    (Blank rows = not tested)  BED MOBILITY:  Not tested  TRANSFERS: Independent Floor to stand: NT    CURB:  Findings: CGA  STAIRS: Findings: Level of Assistance: SBA, Number of Stairs: 6, Height of Stairs: 4-6"   , and Comments: BHR GAIT: Findings: Distance walked: 150, Level of assistance: Modified independence, and Comments: right lateral trunk lean in stance phase  FUNCTIONAL TESTS:  5 times sit to stand: 18.60 sec Timed up and go (TUG): 11.62 sec; TUG cog: 20.75 sec Berg Balance Scale: 43/56 10 meter walk test: 12.22 sec = 2.68 ft/sec  M-CTSIB  Condition 1: Firm Surface, EO 30 Sec, Mild Sway  Condition 2: Firm Surface, EC 30 Sec, Mild and Moderate Sway  Condition 3: Foam Surface, EO 30 Sec, Mild and Moderate Sway  Condition 4: Foam Surface, EC 20 Sec, Moderate and Severe Sway  TREATMENT DATE: 03/20/24      GOALS: Goals reviewed with patient? Yes  SHORT TERM GOALS: Target date: 04/10/2024    Patient will be independent in HEP to improve functional outcomes Baseline: handout was provided and HEP reviewed 04/09/24 Goal status: MET 04/09/24  2.  Demo improved BLE strength and reduced risk for falls per time 15 sec 5xSTS test Baseline: 18 sec; 14.93 sec without UEs 04/09/24  Goal status: MET 04/09/24   3.  Improve safety and access to home environment navigating  flight of stairs modified independence Baseline: SBA; Alternating reciprocal pattern with and without rail with good stability 04/09/24 Goal status: MET 04/09/24  4.  Improve proprioceptive awareness and postural stability for improved safety in ADL by mild sway condition 4 M-CTSIB Baseline: mod-severe x 20 sec; 16 sec L LOB 04/09/24 Goal status: IN PROGRESS 04/09/24    LONG TERM GOALS: Target date: 05/01/2024    Demo 10% or less difference TUG test with cognitive dual-task component to improve safety with mobility Baseline: 78% (TUG reg 11.62; TUG cog 20.75 sec) Goal status: INITIAL  2.  Demo low risk for falls per score 52/56 Berg Balance Test Baseline: 43/56 Goal status: INITIAL  3.  Increase gait speed to 3.0 ft/sec to improve efficiency of community ambulation Baseline: 2.68 ft/sec Goal status: INITIAL  4.  Demo independent ambulation community level distances over various surfaces to improve safety in environment Baseline: modified indep level, supervision-SBA uneven Goal status: INITIAL  5.  Independent floor to stand transfers for improved functional mobility Baseline: NT Goal status: INITIAL    ASSESSMENT:  CLINICAL IMPRESSION: Gait training to improve increased velocity and LLE mechanics for foot clearance w/ onest of fatigue after 2-3 min of faster pace ambulation. No adverse response to activity with abilty to maintain standing for rest periods. Good balance demo throughout with emphasis on lifting mechanics to simulate job activities.  Doing well and anticipate D/C at next session OBJECTIVE IMPAIRMENTS: Abnormal gait, decreased activity tolerance, decreased balance, decreased coordination, decreased endurance, decreased mobility, difficulty walking, decreased ROM, and decreased strength.   ACTIVITY LIMITATIONS: carrying, lifting, bending, squatting, stairs, reach over head, and locomotion level  PARTICIPATION LIMITATIONS: meal prep, cleaning, laundry, interpersonal  relationship, driving, shopping, community activity, occupation, and yard work  PERSONAL FACTORS: Age, Time since onset of injury/illness/exacerbation, and 1-2 comorbidities: hx of hydrocephalus, HTN are also affecting patient's functional outcome.   REHAB POTENTIAL: Excellent  CLINICAL DECISION MAKING: Evolving/moderate complexity  EVALUATION COMPLEXITY: Moderate  PLAN:  PT FREQUENCY: 2x/week  PT DURATION: 6 weeks  PLANNED INTERVENTIONS: 97750- Physical Performance Testing, 97110-Therapeutic exercises, 97530- Therapeutic activity, [id number]- Neuromuscular re-education, 97535- Self Care, [id number]- Manual therapy, [id number]- Gait training, [id number]- Orthotic Initial, [id number]- Orthotic/Prosthetic subsequent, [phone]- Aquatic Therapy, and DME instructions  PLAN FOR NEXT SESSION: D/C   10:18 AM, 04/28/24 M. Kelly Caralyn Twining, PT, DPT Physical Therapist- [place] Office Number: [phone]

## 2024-04-28 NOTE — Therapy (Signed)
 OUTPATIENT OCCUPATIONAL THERAPY NEURO  Treatment Note  Patient Name: Micheal Ayers MRN: [id number] DOB:[birth year], 63 y.o., male Today's Date: 04/28/2024  PCP: Olin Bertin, MD REFERRING PROVIDER: Zelda Hickman, PA-C  END OF SESSION:  OT End of Session - 04/28/24 0959     Visit Number 12    Number of Visits 13    Date for OT Re-Evaluation 05/02/24    Authorization Type [employer] - 60 visits combined (OT/PT/ST)    OT Start Time [id number]    OT Stop Time 1016    OT Time Calculation (min) 42 min    Activity Tolerance Patient tolerated treatment well;Patient limited by fatigue    Behavior During Therapy WFL for tasks assessed/performed                        Past Medical History:  Diagnosis Date   ADD (attention deficit disorder)    Allergic rhinitis, cause unspecified    Chronic kidney disease    Acute renal failure   Hemochromatosis, hereditary (HCC)    seen at [facility]   Hypertension    Major depressive disorder    Stroke [facility])    Past Surgical History:  Procedure Laterality Date   CRANIOTOMY Left 02/24/2024   Procedure: CRANIOTOMY HEMATOMA EVACUATION SUBDURAL;  Surgeon: Joaquin Mulberry, MD;  Location: [facility] OR;  Service: Neurosurgery;  Laterality: Left;   elbow surgury  1969   left elbow   INGUINAL HERNIA REPAIR  2003   left   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/2023   Procedure: Shunt Placment - right occipital;  Surgeon: Agustina Aldrich, MD;  Location: [facility] OR;  Service: Neurosurgery;  Laterality: Right;   Patient Active Problem List   Diagnosis Date Noted   Hyponatremia 03/17/2024   GERD (gastroesophageal reflux disease) 03/17/2024   Anxiety disorder with panic attacks 03/17/2024   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 03/11/2024   Subdural hematoma (HCC) 02/24/2024   S/P craniotomy 02/24/2024   Obstructive hydrocephalus (HCC) 03/13/2023   Ischemic cerebrovascular accident (CVA) (HCC) 02/19/2023   Cerebral ventriculomegaly  02/19/2023   Acute renal failure superimposed on stage 2 chronic kidney disease (HCC) 02/19/2023   Hypertensive urgency 02/19/2023   Abnormal CXR 02/19/2023   CVA (cerebral vascular accident) (HCC) 02/19/2023   Fatigue 09/20/2012   Family history of hemochromatosis 09/20/2012   Preventative health care 09/06/2012   ADD (attention deficit disorder)    Allergic rhinitis     ONSET DATE: 02/24/24  REFERRING DIAG: H84.696 (ICD-10-CM) - 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 He was found to have massive left SDH with signs of septations and small subacute right parafalcine SDH with midline shift. He was evaluated by Dr. Rochelle Chu and underwent left fronto-temporoparietal crani for evacuation of large chronic LDH the same day. He was placed on decadron  and on Keppra  for seizure prophylaxis and serial CT head showed increaseing  post op pneumocephalus with residual L-SDH decreased to 6-8 mm from 12 mm and resolution of medline shift.  PRECAUTIONS: None  WEIGHT BEARING RESTRICTIONS: No  PAIN:  Are you having pain? No  FALLS: Has patient fallen in last 6 months? 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  HAND DOMINANCE: Right  ADLs: Overall ADLs: Mod I - reports more time consuming overall, mild difficulty with getting in/out of shower but no physical assistance Transfers/ambulation related to ADLs: Mod I - using RW 50% of time in the home and when walking in the cul-de-sac Equipment: Grab bars, Walk in shower, and hand held shower head  IADLs: Light housekeeping: is not currently performing and wife did majority even prior Meal Prep: is not currently performing and wife did majority even prior Community mobility: not currently cleared to drive  Medication management: spouse is assisting pt with taking meds appropriately Financial management: spouse is managing bills at this time Handwriting: 100% legible completed PPT #1([place]): 11.66 sec  MOBILITY STATUS: slower and decreased endurance  POSTURE COMMENTS:  No Significant postural limitations  ACTIVITY TOLERANCE: Activity tolerance: diminished  FUNCTIONAL OUTCOME MEASURES: PSFS: 3.0   04/24/24   UPPER EXTREMITY ROM:  WFL bilaterally  UPPER EXTREMITY MMT:   grossly 4/5 bilaterally  COORDINATION: Finger Nose Finger test: mild dysmetria on R and mildly slower compared to L 9 Hole Peg test: Right: 24.53 sec; Left: 29.06 sec Box and Blocks:  Right 45 blocks, Left 43 blocks - frequently picking up 2 blocks but able to recall to only pick up one at a time RAM: pt with  decreased motor control of RUE with rapid hand movements, demonstrating "tremor-like" movement on R  SENSATION: WFL  COGNITION: Overall cognitive status: No family/caregiver present to determine baseline cognitive functioning Brief Interview for Mental Status:   Memory:  Word Repetition:   "Sock"  "Blue"  "Bed"   Orientation Level:    Year:   2025  Month: April  Day of Week: Thursday  Memory:  Recall: Able to recall Blue and bed, unable to recall sock even when given a cue.      VISION: Subjective report: "I can't see far away" no changes Baseline vision: Wears glasses for distance only   VISION ASSESSMENT: Ocular ROM: WFL Gaze preference/alignment: WDL Tracking/Visual pursuits: Able to track stimulus in all quads without difficulty Saccades: WFL Visual Fields: no apparent deficits  OBSERVATIONS: Pt requiring increased time to answer questions and somewhat tangential when asked questions about pt impairments, possibly demonstrating decreased awareness vs decreased memory.  Pt requiring increased time with 9 hole peg test and Box and blocks, most likely due to overall slowed processing as pt reports "everything is a process".  TREATMENT DATE:  04/28/24 Endurance/RTW: pt reports that he is utilizing a stool when he is at his work station to add seated breaks as needed.  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  OT reiterated education on increased activity tolerance, incorporating activity outside of work for increased endurance.  OT also educating on typical time frame for recovery post prolonged hospitalization.   Scavenger hunt - engaged in ambulating around the treatment gym with picking up 8 items from written list (to aid in recall), incorporating picking items up from higher and lower surfaces as well as items of bulk and/or  weight.  Pt utilizing list for recall - stating that he will write items down if needing to remember >3 items.  Pt reports orders at work are typically printed and he can follow the list.  Pt with no balance issues, lightheadedness, or challenge with picking up or transporting items.  Pt also with good recall of where items came from to return.  Planning/organizing: engaged in complex planning/problem solving prompt requiring pt to organize items into systematic schedule to complete all in time allotted. Pt utilizing check system on prompt and writing on lined paper, adding times next to items including a specific time.  Reviewed completing digitally allowing drag and drop options. Discussed pacing and prioritizing activities to ensure enough energy to complete tasks.  Discussed use of online ordering, curb side pickup, and use of digital resources to assist as needed. Pt requiring increased time to organize and sequence stops.     04/24/24 Return to work: pt reports utilizing a list at work to allow for increased sequencing, especially if he has multiple tasks to complete.  Pt recognizes that things were slower the last couple of days at work, potentially due to the weather, which is allowing him an easier transition back to work. Pt reports concerns with balance with aspects of work, continuing to work on balance and endurance with PT.  Pt reports supervisor is accommodating of pt's physical limitations (getting on/off ladder), therapy appts, and possible need to modify his lunch/break schedule. Medications: pt reports that he has transitioned to having his meds in the bathroom (per his previous routine) which has allowed for increased recall and independence.  Pt stating that he ran out of one of his medications over the weekend and there is no refill on the prescription.  OT encouraged pt to f/u with MD from [facility] or PCP to question if that is one that he needs to continue taking or if it is no longer  necessary.  Of note, medication is for constipation which he reports that he is not having issues with. Pill box assessment: pt completed pill box test with 100% accuracy, in 6:20 minutes.  Pt with no errors, demonstrates good multi-tasking and problem solving throughout task. Around 5 minute mark, pt reports that he has been removing pills one at a time from pill bottle vs pouring into palm of hand, which could have resulted in quicker time. Functional cognition further assessed with The Pillbox Test: A Measure of Executive Functioning and Estimate of Medication Management. A straight pass/fail designation is determined by 3 or more errors of omission or misplacement on the task. The pt completed the test with 0 errors. Cognitive/motor dual tasking: ambulated around therapy gym to pick up colored cones from floor to challenge balance, picking up items from floor, and memory.  OT challenged pt to name categorical items based on color  of cones with pt demonstrating good use of association to recall category with color and backwards chaining to recall category by process of elimination.  OT reiterating use of list if greater number of items or tasks to recall as well as focus on one task at a time as able to increase recall and carryover.    04/22/24 Coordination/cognition: engaged in small peg board pattern replication with focus on coordination and attention to task.  Pt completing with good attention to task and no distraction in moderately distracting environment.  OT had pt complete additional pattern while naming items within category to further challenge alternating attention. Pt initially skipping pegs to complete task in grouping to increase recall.  OT encouraged pt to still engage in some alternating of colors to further challenge memory.  OT reiterated memory strategies with "WARM" and functional carryover with writing down sequencing of items or list to increase recall when running errands or  system when returning to work.  Planning/organizing: engaged in complex planning/problem solving prompt requiring pt to organize and plan for meal, to include making a menu and considering amounts and timing.  OT also then providing additional information with money amounts to further challenge mental math and decision making.  Pt utilizing pen and paper to aid in list making for recall and to alter amounts during mostly mental math. RTW: engaged in review of previous education with taking rest breaks, focusing on completing one task at a time, and use of mental vs physical rest breaks as needed to allow pt to engage in active rest if able.  Pt reporting still somewhat "anxious" about the unknown and his expectations of himself but his supervisor has been supportive of his return to work.  PATIENT EDUCATION: Education details: ongoing condition specific education, see above for education on memory strategies and sequencing as needed for return to work. Person educated: Patient Education method: Explanation and Handouts Education comprehension: needs further education  HOME EXERCISE PROGRAM: 03/25/24 - memory strategies and coordination (see pt instructions) 04/16/24 - energy conservation strategies (see pt instructions)   GOALS: Goals reviewed with patient? Yes  SHORT TERM GOALS: Target date: 04/11/24  Pt will be independent with fine and gross motor control HEP with use of visual handouts. Baseline: slower overall with fine and gross motor tasks Goal status: in progress  2.  Pt will verbalize understanding of task modifications and/or potential AE needs to increase ease, safety, and independence w/ ADLs and IADLs. Baseline: limited engagement in IADLs, slower overall with ADLs Goal status: MET  3.  Pt will demonstrate improved memory, sequencing, and problem solving as demonstrated by improvements in score on pill box assessment by ability to complete in 5 mins time limit with </= to 2  errors.  Baseline: wife managing meds/ TBD 03/25/24 - no errors, but required 6:39 to complete 04/07/24 - no errors, completed in 5:34 Goal status: Partially met  4.  Pt will verbalize understanding of energy conservation strategies.  Baseline: decreased endurance  Goal status: in progress - initiated on 04/09/24   LONG TERM GOALS: Target date: 05/02/24  Pt will demonstrate understanding of memory compensations and ways to keep thinking skills sharp Baseline:  Goal status:  in progress  2.  Pt will perform dynamic standing task for 10 mins as needed to complete simple IADLs w/o LOB using DME and/or countertop support prn. Baseline: decreased engagement in IADLs Goal status:  in progress  3.  Pt will demonstrate ability to sequence simple functional task (  simple snack prep, laundry task, etc) at Mod I level with good safety awareness and ability to utilize memory strategies for recall.  Baseline: decreased engagement in IADLs Goal status:  in progress  4.  Pt will navigate a moderately busy environment, completing dual task activity and/or following multi-step commands with 90% accuracy Baseline: impaired memory and thinking strategies and endurance impacting return to work Goal status:  in progress  5.  Pt will be independent with utilization of memory strategies to allow for increased independence with managing personal appointments and in preparation for return to work. Baseline: decreased memory and thinking strategies Goal status:  in progress  6.  Patient will report at least two-point increase in average PSFS score or at least three-point increase in a single activity score indicating functionally significant improvement given minimum detectable change. Baseline: 3.0 04/24/24: 7.7 Goal status:  MET  ASSESSMENT:  CLINICAL IMPRESSION: Pt tolerated tasks well throughout session.  Pt with no c/o of lightheadness when looking up/down and retrieving items from floor and overhead  heights.  Pt verbalizing use of lists to increase recall when items >3, demonstrating good understanding and compensatory strategy to aid in recall.  Pt is showing great progress towards goals.     PERFORMANCE DEFICITS: in functional skills including ADLs, IADLs, coordination, pain, Fine motor control, Gross motor control, balance, body mechanics, endurance, decreased knowledge of precautions, and UE functional use, cognitive skills including emotional, energy/drive, memory, safety awareness, and thought, and psychosocial skills including coping strategies and routines and behaviors.     PLAN:  OT FREQUENCY: 2x/week  OT DURATION: 6 weeks  PLANNED INTERVENTIONS: 97168 OT Re-evaluation, 97535 self care/ADL training, 29562 therapeutic exercise, 97530 therapeutic activity, 97112 neuromuscular re-education, functional mobility training, visual/perceptual remediation/compensation, psychosocial skills training, energy conservation, coping strategies training, patient/family education, and DME and/or AE instructions  RECOMMENDED OTHER SERVICES: may benefit from SLP, TBD after additional session and assessment of SLUMS  CONSULTED AND AGREED WITH PLAN OF CARE: Patient  PLAN FOR NEXT SESSION:   Reiterate energy conservation and pacing during functional tasks  Cognitive/motor dual tasking while naming items in category with mobility/ball toss, incorporate picking up/carrying heavy items? - note: avoid frequent turns   Anthonette Kinsman, OTR/L 04/28/2024, 10:37 AM   [facility] at [facility] [address], [address] [place], [place] [address] Phone # [phone] Fax # [phone]

## 2024-04-30 ENCOUNTER — Ambulatory Visit: Admitting: Occupational Therapy

## 2024-04-30 ENCOUNTER — Ambulatory Visit

## 2024-04-30 DIAGNOSIS — I69118 Other symptoms and signs involving cognitive functions following nontraumatic intracerebral hemorrhage: Secondary | ICD-10-CM

## 2024-04-30 DIAGNOSIS — R2689 Other abnormalities of gait and mobility: Secondary | ICD-10-CM

## 2024-04-30 DIAGNOSIS — I69119 Unspecified symptoms and signs involving cognitive functions following nontraumatic intracerebral hemorrhage: Secondary | ICD-10-CM

## 2024-04-30 DIAGNOSIS — M6281 Muscle weakness (generalized): Secondary | ICD-10-CM

## 2024-04-30 DIAGNOSIS — R2681 Unsteadiness on feet: Secondary | ICD-10-CM

## 2024-04-30 NOTE — Therapy (Signed)
 OUTPATIENT PHYSICAL THERAPY NEURO TREATMENT and D/C Summary   Patient Name: Micheal Ayers MRN: [id number] DOB:09/14/1961, 63 y.o., male Today's Date: 04/30/2024   PCP: Olin Bertin, MD REFERRING PROVIDER: Zelda Hickman, PA-C PHYSICAL THERAPY DISCHARGE SUMMARY  Visits from Start of Care: 13  Current functional level related to goals / functional outcomes: Goals met   Remaining deficits: Gait deviations w/ instances of decreased foot clearance   Education / Equipment: HEP   Patient agrees to discharge. Patient goals were met. Patient is being discharged due to meeting the stated rehab goals.   END OF SESSION:  PT End of Session - 04/30/24 1019     Visit Number 13    Number of Visits 13    Date for PT Re-Evaluation 05/01/24    Authorization Type [employer]    Authorization Time Period 60 visit limit combined    Authorization - Visit Number 12    Authorization - Number of Visits 30    PT Start Time 1015    PT Stop Time 1100    PT Time Calculation (min) 45 min    Equipment Utilized During Treatment Gait belt    Activity Tolerance Patient tolerated treatment well    Behavior During Therapy WFL for tasks assessed/performed                Past Medical History:  Diagnosis Date   ADD (attention deficit disorder)    Allergic rhinitis, cause unspecified    Chronic kidney disease    Acute renal failure   Hemochromatosis, hereditary (HCC)    seen at [facility]   Hypertension    Major depressive disorder    Stroke [facility])    Past Surgical History:  Procedure Laterality Date   CRANIOTOMY Left 02/24/2024   Procedure: CRANIOTOMY HEMATOMA EVACUATION SUBDURAL;  Surgeon: Joaquin Mulberry, MD;  Location: [facility] OR;  Service: Neurosurgery;  Laterality: Left;   elbow surgury  1969   left elbow   INGUINAL HERNIA REPAIR  2003   left   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/2023   Procedure: Shunt Placment - right occipital;  Surgeon: Agustina Aldrich, MD;  Location: [facility] OR;  Service:  Neurosurgery;  Laterality: Right;   Patient Active Problem List   Diagnosis Date Noted   Hyponatremia 03/17/2024   GERD (gastroesophageal reflux disease) 03/17/2024   Anxiety disorder with panic attacks 03/17/2024   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 03/11/2024   Subdural hematoma (HCC) 02/24/2024   S/P craniotomy 02/24/2024   Obstructive hydrocephalus (HCC) 03/13/2023   Ischemic cerebrovascular accident (CVA) (HCC) 02/19/2023   Cerebral ventriculomegaly 02/19/2023   Acute renal failure superimposed on stage 2 chronic kidney disease (HCC) 02/19/2023   Hypertensive urgency 02/19/2023   Abnormal CXR 02/19/2023   CVA (cerebral vascular accident) (HCC) 02/19/2023   Fatigue 09/20/2012   Family history of hemochromatosis 09/20/2012   Preventative health care 09/06/2012   ADD (attention deficit disorder)    Allergic rhinitis     ONSET DATE: 02/19/24  REFERRING DIAG:  W09.811 (ICD-10-CM) - 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 male with history of CVA 01/2023, obstructive hydrocephalus status post VP shunt 02/2023, chronic/GAD, panic attacks, low back to 02/24/2024 with 2-day history of right facial droop and difficulty DH with signs of septations and small subacute right parafalcine SDH with midline shift.  He underwent left for evacuation of bleed by Dr. Rochelle Chu.  Postop placed  on Decadron  as well as Keppra  for seizure prophylaxis.  Follow-up CT showed resolution of midline shift Decadron  was discharged.  Therapy was working with patient who was noted to have balance deficits with decreasing coordination on right, difficulty weightbearing through RLE as well as tendency to drift when walking.  He was independent working prior to admission.  CIR was recommended due to functional decline.  PAIN:  Are you having pain? Yes: NPRS scale: 3/10 Pain location: B LEs Pain description: aching, irritating  Aggravating factors:   Relieving factors:    PRECAUTIONS: Other:  Special Instructions: No driving, strenuous activity or return to work till cleared by MD. *04/16/2024:  Has been cleared to drive* Recommend repeat check BMET in 1-2 weeks to monitor sodium levels  RED FLAGS: None   WEIGHT BEARING RESTRICTIONS: No  FALLS: Has patient fallen in last 6 months? No  LIVING ENVIRONMENT: Lives with: lives with their family and lives with their spouse Lives in: House/apartment Stairs: Yes: Internal: flight steps; can reach both Has following equipment at home: Otho Blitz - 2 wheeled Master on main floor. PLOF: Independent,   PATIENT GOALS: Improve balance, walking, and mobility  OBJECTIVE:    TODAY'S TREATMENT: 04/30/24 Activity Comments  POC performance and review                     [facility] PT Assessment - 04/30/24 0001       Berg Balance Test   Sit to Stand Able to stand without using hands and stabilize independently    Standing Unsupported Able to stand safely 2 minutes    Sitting with Back Unsupported but Feet Supported on Floor or Stool Able to sit safely and securely 2 minutes    Stand to Sit Sits safely with minimal use of hands    Transfers Able to transfer safely, minor use of hands    Standing Unsupported with Eyes Closed Able to stand 10 seconds safely    Standing Unsupported with Feet Together Able to place feet together independently and stand 1  minute safely    From Standing, Reach Forward with Outstretched Arm Can reach confidently >25 cm (10")    From Standing Position, Pick up Object from Floor Able to pick up shoe safely and easily    From Standing Position, Turn to Look Behind Over each Shoulder Looks behind from both sides and weight shifts well    Turn 360 Degrees Able to turn 360 degrees safely in 4 seconds or less    Standing Unsupported, Alternately Place Feet on Step/Stool Able to stand independently and safely and complete 8 steps in 20 seconds    Standing Unsupported, One Foot in Front Able to place foot tandem independently and hold 30 seconds    Standing on One Leg Able to lift leg independently and hold 5-10 seconds    Total Score 55                 Access Code: [email or web address] URL: [email or web address] Date: 04/16/2024 Prepared by: [facility] Outpatient  Rehab - [facility]  Exercises - Corner Balance Feet  Together With Eyes Closed  - 1 x daily - 7 x weekly - 3 sets - 30 sec hold - Corner Balance Feet Together: Eyes Open With Head Turns  - 1 x daily - 7 x weekly - 3 sets - 30 sec hold - Corner Balance Feet Together: Eyes Closed With Head Turns  - 1 x daily - 7 x weekly - 3 sets - 30 sec hold - Sit to Stand in Stride with AFO and UE Assist in LE Alignment  - 1 x daily - 7 x weekly - 3 sets - 10 reps - Standing Foot Lift on Box (BKA)  - 1 x daily - 7 x weekly - 2-3 sets - 10 reps - Side Stepping with Counter Support  - 2 x daily - 7 x weekly - 2-3 min hold - Backward Walking with Counter Support  - 2 x daily - 7 x weekly - 2-3 min hold - Forward and Backward Monster Walk with Resistance at Ankles and Counter Support  - 2 x daily - 7 x weekly - 2-3 min hold - Tandem Walking with Counter Support  - 7 x weekly - 2 min hold   PATIENT EDUCATION: Education details: Additions to HEP Person educated: Patient Education method: Explanation, Demonstration, Tactile cues, Verbal cues, and  Handouts Education comprehension: verbalized understanding and returned demonstration   ----------------------------------------------------------  Note: Objective measures were completed at Evaluation unless otherwise noted.  DIAGNOSTIC FINDINGS:  IMPRESSION: 1. Increasing postoperative Pneumocephalus, now affecting both anterior frontal lobes (see series 4, image 17). 2. Subdural drain removed with interval increased residual Left Subdural Hematoma; 12 mm maximal thickness now vs 6-8 mm on 02/25/2022. Small volume para falcine SDH unchanged. 3. No significant midline shift. Stable basilar cisterns. Stable CSF shunt and ventriculomegaly.  COGNITION: Overall cognitive status: History of cognitive impairments - at baseline per pt report since onset of hydrocephalus having increased difficulty with conversation and recall   SENSATION: WFL  COORDINATION: Difficulty with rapid alternating movements on right Finger to nose WNL Heel to shin WNL  EDEMA:  none  MUSCLE TONE: WNL    POSTURE: No Significant postural limitations  LOWER EXTREMITY ROM:     Active  Right Eval Left Eval  Hip flexion    Hip extension    Hip abduction    Hip adduction    Hip internal rotation    Hip external rotation    Knee flexion    Knee extension    Ankle dorsiflexion 8 15  Ankle plantarflexion    Ankle inversion    Ankle eversion     (Blank rows = not tested)  LOWER EXTREMITY MMT:    MMT Right Eval Left Eval  Hip flexion 4 5  Hip extension    Hip abduction 3 5  Hip adduction 4 5  Hip internal rotation    Hip external rotation    Knee flexion 4 5  Knee extension 4 5  Ankle dorsiflexion 3+ 5  Ankle plantarflexion    Ankle inversion    Ankle eversion    (Blank rows = not tested)  BED MOBILITY:  Not tested  TRANSFERS: Independent Floor to stand: NT    CURB:  Findings: CGA  STAIRS: Findings: Level of Assistance: SBA, Number of Stairs: 6, Height of Stairs: 4-6"    , and Comments: BHR GAIT: Findings: Distance walked: 150, Level of assistance: Modified independence, and Comments: right lateral trunk lean in stance phase  FUNCTIONAL TESTS:  5 times sit  to stand: 18.60 sec Timed up and go (TUG): 11.62 sec; TUG cog: 20.75 sec Berg Balance Scale: 43/56 10 meter walk test: 12.22 sec = 2.68 ft/sec  M-CTSIB  Condition 1: Firm Surface, EO 30 Sec, Mild Sway  Condition 2: Firm Surface, EC 30 Sec, Mild and Moderate Sway  Condition 3: Foam Surface, EO 30 Sec, Mild and Moderate Sway  Condition 4: Foam Surface, EC 20 Sec, Moderate and Severe Sway                                                                                                                                  TREATMENT DATE: 03/20/24      GOALS: Goals reviewed with patient? Yes  SHORT TERM GOALS: Target date: 04/10/2024    Patient will be independent in HEP to improve functional outcomes Baseline: handout was provided and HEP reviewed 04/09/24 Goal status: MET 04/09/24  2.  Demo improved BLE strength and reduced risk for falls per time 15 sec 5xSTS test Baseline: 18 sec; 14.93 sec without UEs 04/09/24  Goal status: MET 04/09/24   3.  Improve safety and access to home environment navigating flight of stairs modified independence Baseline: SBA; Alternating reciprocal pattern with and without rail with good stability 04/09/24 Goal status: MET 04/09/24  4.  Improve proprioceptive awareness and postural stability for improved safety in ADL by mild sway condition 4 M-CTSIB Baseline: mod-severe x 20 sec; 16 sec L LOB 04/09/24 Goal status: MET (04/30/24)    LONG TERM GOALS: Target date: 05/01/2024    Demo 10% or less difference TUG test with cognitive dual-task component to improve safety with mobility Baseline: 78% (TUG reg 11.62; TUG cog 20.75 sec); 8 sec reg, 11 sec cog Goal status: MET  2.  Demo low risk for falls per score 52/56 Berg Balance Test Baseline: 43/56; 55/56 Goal status:  MET  3.  Increase gait speed to 3.0 ft/sec to improve efficiency of community ambulation Baseline: 2.68 ft/sec; 2.8 ft/sec for self-chosen Goal status: NOT MET  4.  Demo independent ambulation community level distances over various surfaces to improve safety in environment Baseline: modified indep level, supervision-SBA uneven; independent Goal status: MET  5.  Independent floor to stand transfers for improved functional mobility Baseline: Goal status: MET    ASSESSMENT:  CLINICAL IMPRESSION: Performance and review to all STG and LTG meeting all except walking speed with self-chose pace of 2.8 ft/sec.  Demo independence in functional mobility and low risk for falls per outcome measures.  Pt has been able to resume work duties without difficulty.  Has some ongoing issues of decreased foot clearance affecting right ankle but compensates well and ambulates various surfaces, stairs, elevations without issue independently. D/C to HEP at this time OBJECTIVE IMPAIRMENTS: Abnormal gait, decreased activity tolerance, decreased balance, decreased coordination, decreased endurance, decreased mobility, difficulty walking, decreased ROM, and decreased strength.   ACTIVITY LIMITATIONS: carrying, lifting, bending, squatting, stairs, reach over  head, and locomotion level  PARTICIPATION LIMITATIONS: meal prep, cleaning, laundry, interpersonal relationship, driving, shopping, community activity, occupation, and yard work  PERSONAL FACTORS: Age, Time since onset of injury/illness/exacerbation, and 1-2 comorbidities: hx of hydrocephalus, HTN are also affecting patient's functional outcome.   REHAB POTENTIAL: Excellent  CLINICAL DECISION MAKING: Evolving/moderate complexity  EVALUATION COMPLEXITY: Moderate  PLAN:  PT FREQUENCY: 2x/week  PT DURATION: 6 weeks  PLANNED INTERVENTIONS: 97750- Physical Performance Testing, 97110-Therapeutic exercises, 97530- Therapeutic activity, [id number]- Neuromuscular  re-education, 97535- Self Care, [id number]- Manual therapy, [id number]- Gait training, [phone]- Orthotic Initial, [id number]- Orthotic/Prosthetic subsequent, [phone]- Aquatic Therapy, and DME instructions  PLAN FOR NEXT SESSION: D/C   10:48 AM, 04/30/24 M. Kelly Jahmere Bramel, PT, DPT Physical Therapist- [place] Office Number: [phone]

## 2024-04-30 NOTE — Therapy (Signed)
 OUTPATIENT OCCUPATIONAL THERAPY NEURO  Treatment Note  Patient Name: Micheal Ayers MRN: [id number] DOB:Jul 03, 1961, 63 y.o., male Today's Date: 04/30/2024  PCP: Olin Bertin, MD REFERRING PROVIDER: Zelda Hickman, PA-C  END OF SESSION:  OT End of Session - 04/30/24 1207     Visit Number 13    Number of Visits 13    Date for OT Re-Evaluation 05/02/24    Authorization Type [employer] - 60 visits combined (OT/PT/ST)    OT Start Time [phone]    OT Stop Time 1013    OT Time Calculation (min) 40 min    Activity Tolerance Patient tolerated treatment well;Patient limited by fatigue    Behavior During Therapy WFL for tasks assessed/performed                         Past Medical History:  Diagnosis Date   ADD (attention deficit disorder)    Allergic rhinitis, cause unspecified    Chronic kidney disease    Acute renal failure   Hemochromatosis, hereditary (HCC)    seen at [facility]   Hypertension    Major depressive disorder    Stroke [facility])    Past Surgical History:  Procedure Laterality Date   CRANIOTOMY Left 02/24/2024   Procedure: CRANIOTOMY HEMATOMA EVACUATION SUBDURAL;  Surgeon: Joaquin Mulberry, MD;  [facility];  Service: Neurosurgery;  Laterality: Left;   elbow surgury  1969   left elbow   INGUINAL HERNIA REPAIR  2003   left   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/2023   Procedure: Shunt Placment - right occipital;  Surgeon: Agustina Aldrich, MD;  Location: [facility] OR;  Service: Neurosurgery;  Laterality: Right;   Patient Active Problem List   Diagnosis Date Noted   Hyponatremia 03/17/2024   GERD (gastroesophageal reflux disease) 03/17/2024   Anxiety disorder with panic attacks 03/17/2024   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 03/11/2024   Subdural hematoma (HCC) 02/24/2024   S/P craniotomy 02/24/2024   Obstructive hydrocephalus (HCC) 03/13/2023   Ischemic cerebrovascular accident (CVA) (HCC) 02/19/2023   Cerebral ventriculomegaly  02/19/2023   Acute renal failure superimposed on stage 2 chronic kidney disease (HCC) 02/19/2023   Hypertensive urgency 02/19/2023   Abnormal CXR 02/19/2023   CVA (cerebral vascular accident) (HCC) 02/19/2023   Fatigue 09/20/2012   Family history of hemochromatosis 09/20/2012   Preventative health care 09/06/2012   ADD (attention deficit disorder)    Allergic rhinitis     ONSET DATE: 02/24/24  REFERRING DIAG: W29.562 (ICD-10-CM) - 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    Pt reports "feeling pretty good" about this being last visit.    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 He was found to have massive left SDH with signs of septations and small subacute right parafalcine SDH with midline shift. He was evaluated by Dr. Rochelle Chu and underwent left fronto-temporoparietal crani for evacuation of large chronic LDH the same day. He was  placed on decadron  and on Keppra  for seizure prophylaxis and serial CT head showed increaseing post op pneumocephalus with residual L-SDH decreased to 6-8 mm from 12 mm and resolution of medline shift.  PRECAUTIONS: None  WEIGHT BEARING RESTRICTIONS: No  PAIN:  Are you having pain? Yes: NPRS scale: 1/10 Pain location: mid/low back and hips Pain description:  aching Aggravating factors: none Relieving factors: none  FALLS: Has patient fallen in last 6 months? 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  HAND DOMINANCE: Right  ADLs: Overall ADLs: Mod I - reports more time consuming overall, mild difficulty with getting in/out of shower but no physical assistance Transfers/ambulation related to ADLs: Mod I - using RW 50% of time in the home and when walking in the cul-de-sac Equipment: Grab bars, Walk in shower, and hand held shower head  IADLs: Light housekeeping: is not currently performing and wife did majority even prior Meal Prep: is not currently performing and wife did majority even prior Community mobility: not currently cleared to drive  Medication management: spouse is assisting pt with taking meds appropriately Financial management: spouse is managing bills at this time Handwriting: 100% legible completed PPT #1([place]): 11.66 sec  MOBILITY STATUS: slower and decreased endurance  POSTURE COMMENTS:  No Significant postural limitations  ACTIVITY TOLERANCE: Activity tolerance: diminished  FUNCTIONAL OUTCOME MEASURES: PSFS: 3.0   04/24/24   UPPER EXTREMITY ROM:  WFL bilaterally  UPPER EXTREMITY MMT:   grossly 4/5 bilaterally  COORDINATION: Finger Nose Finger test: mild dysmetria on R and mildly slower compared to L 9 Hole Peg  test: Right: 24.53 sec; Left: 29.06 sec Box and Blocks:  Right 45 blocks, Left 43 blocks - frequently picking up 2 blocks but able to recall to only pick up one at a time RAM: pt with decreased motor control of RUE with rapid hand movements, demonstrating "tremor-like" movement on R  SENSATION: WFL  COGNITION: Overall cognitive status: No family/caregiver present to determine baseline cognitive functioning Brief Interview for Mental Status:   Memory:  Word Repetition:   "Sock"  "Blue"  "Bed"   Orientation Level:    Year:   2025  Month: April  Day of Week: Thursday  Memory:  Recall: Able to recall Blue and bed, unable to recall sock even when given a cue.      VISION: Subjective report: "I can't see far away" no changes Baseline vision: Wears glasses for distance only   VISION ASSESSMENT: Ocular ROM: WFL Gaze preference/alignment: WDL Tracking/Visual pursuits: Able to track stimulus in all quads without difficulty Saccades: WFL Visual Fields: no apparent deficits  OBSERVATIONS: Pt requiring increased time to answer questions and somewhat tangential when asked questions about pt impairments, possibly demonstrating decreased awareness vs decreased memory.  Pt requiring increased time with 9 hole peg test and Box and blocks, most likely due to overall slowed processing as pt reports "everything is a process".  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   Memory strategies: reviewed writing things down, word association, repeating, use of sticky notes to increase recall both with home routine and work tasks.   Self-care/activity tolerance: Pt reports increased engagement in  household tasks. pt reports taking medication completely on his own and meds are back in his bathroom per his previous routine, aiding in recall and sequencing of task. Housekeeping: pt reports completing laundry, vacuuming, and dusting without any issues. Dual tasking/activity tolerance: engaged in ambulating around therapy clinic while tossing ball and naming items in category.  Pt naming food items in alphabetical order and then naming items that are red.  Pt initially with difficulty with sequencing with walking, tossing ball, and naming items but demonstrating improved ability to continue with mobility, even while thinking of next item.    04/28/24 Endurance/RTW: pt reports that he is utilizing a stool when he is at his work station to add seated breaks as needed.  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  OT reiterated education on increased activity tolerance, incorporating activity outside of work for increased endurance.  OT also educating on typical time frame for recovery post prolonged hospitalization.   Scavenger hunt - engaged in ambulating around the treatment gym with picking up 8 items from written list (to aid in recall), incorporating picking items up from higher and lower surfaces as well as items of bulk and/or weight.  Pt utilizing list for recall - stating that he will write items down if needing to remember >3 items.  Pt reports orders at work are typically printed and he can follow the list.  Pt with no balance issues, lightheadedness, or challenge with picking up or transporting items.  Pt also with good recall of where items came from to return.  Planning/organizing: engaged in complex planning/problem solving prompt requiring pt to organize items into systematic schedule to complete all in time allotted. Pt utilizing check system on prompt and writing on lined paper, adding times next to items including a specific time.  Reviewed completing digitally  allowing drag and drop options. Discussed pacing and prioritizing activities to ensure enough energy to complete tasks.  Discussed use of online ordering, curb side pickup, and use of digital resources to assist as needed. Pt requiring increased time to organize and sequence stops.     04/24/24 Return to work: pt reports utilizing a list at work to allow for increased sequencing, especially if he has multiple tasks to complete.  Pt recognizes that things were slower the last couple of days at work, potentially due to the weather, which is allowing him an easier transition back to work. Pt reports concerns with balance with aspects of work, continuing to work on balance and endurance with PT.  Pt reports supervisor is accommodating of pt's physical limitations (getting on/off ladder), therapy appts, and possible need to modify his lunch/break schedule. Medications: pt reports that he has transitioned to having his meds in the bathroom (per his previous routine) which has allowed for increased recall and independence.  Pt stating that he ran out of one of his medications over the weekend and there is no refill on the prescription.  OT encouraged pt to f/u with MD from [facility] or PCP to question if that is  one that he needs to continue taking or if it is no longer necessary.  Of note, medication is for constipation which he reports that he is not having issues with. Pill box assessment: pt completed pill box test with 100% accuracy, in 6:20 minutes.  Pt with no errors, demonstrates good multi-tasking and problem solving throughout task. Around 5 minute mark, pt reports that he has been removing pills one at a time from pill bottle vs pouring into palm of hand, which could have resulted in quicker time. Functional cognition further assessed with The Pillbox Test: A Measure of Executive Functioning and Estimate of Medication Management. A straight pass/fail designation is determined by 3 or more errors of  omission or misplacement on the task. The pt completed the test with 0 errors. Cognitive/motor dual tasking: ambulated around therapy gym to pick up colored cones from floor to challenge balance, picking up items from floor, and memory.  OT challenged pt to name categorical items based on color of cones with pt demonstrating good use of association to recall category with color and backwards chaining to recall category by process of elimination.  OT reiterating use of list if greater number of items or tasks to recall as well as focus on one task at a time as able to increase recall and carryover.   PATIENT EDUCATION: Education details: ongoing condition specific education, see above for education on memory strategies and sequencing as needed for return to work. Person educated: Patient Education method: Explanation and Handouts Education comprehension: needs further education  HOME EXERCISE PROGRAM: 03/25/24 - memory strategies and coordination (see pt instructions) 04/16/24 - energy conservation strategies (see pt instructions)   GOALS: Goals reviewed with patient? Yes  SHORT TERM GOALS: Target date: 04/11/24  Pt will be independent with fine and gross motor control HEP with use of visual handouts. Baseline: slower overall with fine and gross motor tasks Goal status: in progress  2.  Pt will verbalize understanding of task modifications and/or potential AE needs to increase ease, safety, and independence w/ ADLs and IADLs. Baseline: limited engagement in IADLs, slower overall with ADLs Goal status: MET  3.  Pt will demonstrate improved memory, sequencing, and problem solving as demonstrated by improvements in score on pill box assessment by ability to complete in 5 mins time limit with </= to 2 errors.  Baseline: wife managing meds/ TBD 03/25/24 - no errors, but required 6:39 to complete 04/07/24 - no errors, completed in 5:34 Goal status: Partially met  4.  Pt will verbalize  understanding of energy conservation strategies.  Baseline: decreased endurance  Goal status: MET    LONG TERM GOALS: Target date: 05/02/24  Pt will demonstrate understanding of memory compensations and ways to keep thinking skills sharp Baseline:  Goal status: MET   2.  Pt will perform dynamic standing task for 10 mins as needed to complete simple IADLs w/o LOB using DME and/or countertop support prn. Baseline: decreased engagement in IADLs Goal status:  MET  3.  Pt will demonstrate ability to sequence simple functional task (simple snack prep, laundry task, etc) at Mod I level with good safety awareness and ability to utilize memory strategies for recall.  Baseline: decreased engagement in IADLs Goal status:  MET  4.  Pt will navigate a moderately busy environment, completing dual task activity and/or following multi-step commands with 90% accuracy Baseline: impaired memory and thinking strategies and endurance impacting return to work Goal status:  in progress  5.  Pt will  be independent with utilization of memory strategies to allow for increased independence with managing personal appointments and in preparation for return to work. Baseline: decreased memory and thinking strategies Goal status:  MET  6.  Patient will report at least two-point increase in average PSFS score or at least three-point increase in a single activity score indicating functionally significant improvement given minimum detectable change. Baseline: 3.0 04/24/24: 7.7 Goal status:  MET  ASSESSMENT:  CLINICAL IMPRESSION: Pt has met 5 of 6 LTGs, reporting still some hesitancy with recall and dual tasking especially when at work.  Reviewed each goal with pt and pt able to report understanding of each and progress towards each.  Pt requesting to hold on therapy ~30 days to allow for focus on work tasks to assess if sequencing and recall with work tasks improve with time and repetition (by working) and then return  if additional concerns remain. Pt to inform therapist on his decision when returning to ST on 05/20/24.  Pt in agreement with therapist to d/c after 05/30/24 if no further need for therapy.  PERFORMANCE DEFICITS: in functional skills including ADLs, IADLs, coordination, pain, Fine motor control, Gross motor control, balance, body mechanics, endurance, decreased knowledge of precautions, and UE functional use, cognitive skills including emotional, energy/drive, memory, safety awareness, and thought, and psychosocial skills including coping strategies and routines and behaviors.     PLAN:  OT FREQUENCY: 2x/week  OT DURATION: 6 weeks  PLANNED INTERVENTIONS: 97168 OT Re-evaluation, 97535 self care/ADL training, 53664 therapeutic exercise, 97530 therapeutic activity, 97112 neuromuscular re-education, functional mobility training, visual/perceptual remediation/compensation, psychosocial skills training, energy conservation, coping strategies training, patient/family education, and DME and/or AE instructions  RECOMMENDED OTHER SERVICES: may benefit from SLP, TBD after additional session and assessment of SLUMS  CONSULTED AND AGREED WITH PLAN OF CARE: Patient  PLAN FOR NEXT SESSION:   Reiterate energy conservation and pacing during functional tasks  Cognitive/motor dual tasking while naming items in category with mobility/ball toss, incorporate picking up/carrying heavy items? - note: avoid frequent turns   Kariah Loredo, Isa Manuel, OTR/L 04/30/2024, 12:08 PM   [facility] at [facility] [address] [place], [place] [address] Phone # [phone] Fax # [phone]

## 2024-05-07 ENCOUNTER — Encounter: Payer: Self-pay | Admitting: Physical Medicine and Rehabilitation

## 2024-05-07 ENCOUNTER — Encounter: Attending: Registered Nurse | Admitting: Physical Medicine and Rehabilitation

## 2024-05-07 VITALS — BP 116/83 | HR 91 | Ht 72.0 in | Wt 236.0 lb

## 2024-05-07 DIAGNOSIS — S065XAA Traumatic subdural hemorrhage with loss of consciousness status unknown, initial encounter: Secondary | ICD-10-CM | POA: Insufficient documentation

## 2024-05-07 DIAGNOSIS — R5383 Other fatigue: Secondary | ICD-10-CM | POA: Insufficient documentation

## 2024-05-07 DIAGNOSIS — I69119 Unspecified symptoms and signs involving cognitive functions following nontraumatic intracerebral hemorrhage: Secondary | ICD-10-CM | POA: Diagnosis present

## 2024-05-07 DIAGNOSIS — Z87898 Personal history of other specified conditions: Secondary | ICD-10-CM | POA: Diagnosis not present

## 2024-05-07 NOTE — Progress Notes (Signed)
 Subjective:    Patient ID: Micheal Ayers, male    DOB: [birth year], 63 y.o.   MRN: [id number]  HPI  Micheal Ayers is a 63 y.o. year old male  who  has a past medical history of ADD (attention deficit disorder), Allergic rhinitis, cause unspecified, Chronic kidney disease, Hemochromatosis, hereditary (HCC), Hypertension, Major depressive disorder, and Stroke (HCC).   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  Feels he is doing okay functionally, limited by fatigue and poor stamina; he is independent of ADLs, not using anything for assistance.  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  He finds this is sometimes difficult and he fatigues quickly, but he is able to do it.  He states that work restrictions are not reasonable, and he will not follow them if given.  states he was cleared by therapy to return to driving, which he has already been doing.  He has had no issues with this.   - Follow ups: Saw Dr. Adonis Alamin in follow-up, has not seen Dr. Rochelle Chu yet.  Told he was doing well, no new recommendations.   - Falls: No falls or near misses since discharge   - DME: Not using any assistive devices, including walker at this point.   - Medications: Not currently on any pain medication; denies headaches.   - Other concerns: Is noting increasing fatigue, especially in the early mornings and mid afternoons at his job.  He takes 2 breaks a day, 15 minutes or so to recover; this helps him get through his shift.  He does endorse history of snoring, has never had a sleep study.  He thinks his sleep  quality is good, but does note frequent interruptions/awakenings.  He notes ongoing difficulty with concentration in highly stimulating environments, especially if more than 1 person is talking at the same time.  This presents mostly as difficulty word finding, although has been improving.      Pain Inventory Average Pain 1 Pain Right Now 1 My pain is intermittent, dull, tingling, and aching  LOCATION OF PAIN  Head, both knees  BOWEL Number of stools per week: 2-3 Oral laxative use No    BLADDER Normal   Mobility walk without assistance how many minutes can you walk? 10-15 ability to climb steps?  yes do you drive?  yes Do you have any goals in this area?  yes  Function employed # of hrs/week 44 hours per week as [employer] Do you have any goals in this area?  yes  Neuro/Psych numbness tingling trouble walking dizziness anxiety  Prior Studies Any changes since last visit?  no  Physicians involved in your care Any changes since last visit?  no   Family History  Problem Relation Age of Onset   Hypertension Other    Alcohol abuse Other    Hypertension Other    Kidney disease Other    Cancer Other    Alcohol abuse Other    Arthritis Other    Stroke Other    Hypertension Other  Cancer Other    Social History   Socioeconomic History   Marital status: Married    Spouse name: Not on file   Number of children: Not on file   Years of education: Not on file   Highest education level: Not on file  Occupational History   Not on file  Tobacco Use   Smoking status: Former   Smokeless tobacco: Never  Vaping Use   Vaping status: Never Used  Substance and Sexual Activity   Alcohol use: Not Currently    Comment: social   Drug use: No   Sexual activity: Not on file  Other Topics Concern   Not on file  Social History Narrative   Not on file   Social Drivers of Health   Financial Resource Strain: Not on file  Food Insecurity: No Food  Insecurity (02/24/2024)   Hunger Vital Sign    Worried About Running Out of Food in the Last Year: Never true    Ran Out of Food in the Last Year: Never true  Transportation Needs: No Transportation Needs (02/24/2024)   PRAPARE - Administrator, Civil Service (Medical): No    Lack of Transportation (Non-Medical): No  Physical Activity: Not on file  Stress: Not on file  Social Connections: Not on file   Past Surgical History:  Procedure Laterality Date   CRANIOTOMY Left 02/24/2024   Procedure: CRANIOTOMY HEMATOMA EVACUATION SUBDURAL;  Surgeon: Joaquin Mulberry, MD;  Location[facility] OR;  Service: Neurosurgery;  Laterality: Left;   elbow surgury  1969   left elbow   INGUINAL HERNIA REPAIR  2003   left   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/2023   Procedure: Shunt Placment - right occipital;  Surgeon: Agustina Aldrich, MD;  Location: [facility] OR;  Service: Neurosurgery;  Laterality: Right;   Past Medical History:  Diagnosis Date   ADD (attention deficit disorder)    Allergic rhinitis, cause unspecified    Chronic kidney disease    Acute renal failure   Hemochromatosis, hereditary (HCC)    seen at [facility]   Hypertension    Major depressive disorder    Stroke (HCC)    Ht 6' (1.829 m)   Wt 236 lb (107 kg)   BMI 32.01 kg/m   Opioid Risk Score:   Fall Risk Score:  `1  Depression screen [facility] 2/9     03/25/2024    1:42 PM  Depression screen PHQ 2/9  Decreased Interest 2  Down, Depressed, Hopeless 2  PHQ - 2 Score 4  Altered sleeping 1  Tired, decreased energy 3  Change in appetite 1  Feeling bad or failure about yourself  3  Trouble concentrating 2  Moving slowly or fidgety/restless 3  Suicidal thoughts 1  PHQ-9 Score 18  Difficult doing work/chores Somewhat difficult    Review of Systems  Neurological:  Positive for dizziness and numbness.       Anxiety, tingling  All other systems reviewed and are negative.      Objective:   Physical Exam   PE: Constitution:  Appropriate appearance for age. No apparent distress   Resp: No respiratory distress. No accessory muscle usage. on RA and CTAB Cardio: Well perfused appearance.  No peripheral edema. Abdomen: Nondistended. Nontender.   Psych: Appropriate mood and affect.  Mildly anxious, pressured. Neuro: AAOx4. No apparent cognitive deficits.  Names and remembers 3 out of 3 items on testing.  Can perform complex problem-solving.   Neurologic Exam:   + Dizziness with  VOR suppression testing, no apparent nystagmus DTRs: Reflexes were 2+ in bilateral achilles, patella, biceps, BR and triceps. Babinsky: flexor responses b/l.   Hoffmans: negative b/l Sensory exam: revealed normal sensation in all dermatomal regions in bilateral upper extremities and bilateral lower extremities Motor exam: strength 5/5 throughout bilateral upper extremities and bilateral lower extremities Coordination: Fine motor coordination was normal.  Mild balance deficits with single-leg stance. Gait: normal      Assessment & Plan:   BEARETT PORCARO is a 64 y.o. year old male  who  has a past medical history of ADD (attention deficit disorder), Allergic rhinitis, cause unspecified, Chronic kidney disease, Hemochromatosis, hereditary (HCC), Hypertension, Major depressive disorder, and Stroke (HCC).   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  Will provide letter for work related rest breaks, recommending at least 320-minute breaks per shift to improve fatigue.  Will provide letter, although patient notes  that he is unlikely to comply with accommodations.  Follow-up in 3 months  History of snoring Fatigue, unspecified type Discussed that sleep apnea is more common in brain injury population; will send referral to sleep study

## 2024-05-16 DIAGNOSIS — Z87898 Personal history of other specified conditions: Secondary | ICD-10-CM | POA: Insufficient documentation

## 2024-05-20 ENCOUNTER — Ambulatory Visit

## 2024-05-20 ENCOUNTER — Other Ambulatory Visit (HOSPITAL_COMMUNITY): Payer: Self-pay | Admitting: Neurosurgery

## 2024-05-20 DIAGNOSIS — R41841 Cognitive communication deficit: Secondary | ICD-10-CM

## 2024-05-20 DIAGNOSIS — S065XAA Traumatic subdural hemorrhage with loss of consciousness status unknown, initial encounter: Secondary | ICD-10-CM

## 2024-05-20 NOTE — Therapy (Signed)
 [place] [facility] [address], [address] [place], [place], [address] Phone: [phone]   Fax:  [phone]  Patient Details  Name: Micheal Ayers MRN: [id number] Date of Birth: 08-15-1961 Referring Provider:  Verena Mems, MD  Encounter Date: 05/20/2024  ST Screen Pt arrived today and SLP used conversational assessment to screen [name] for any lingering speech and language and/or cognitive linguistic skills that would warrant therapy. After 15 minutes SLP did not detect any sx that would necessitate pt's need for ST. Also, pt did not indicate any new or worsening skills with speech/language or cognitive linguistics that would justify his return to ST.  Pt expressed thanks to SLP about each therapy (PT, OT, ST) he rec'd at this clinic, and previously.    Solomiya Pascale, CCC-SLP 05/20/2024, 9:01 AM  [place] [facility] [address] [place], [place], [address] Phone: [phone]   Fax:  [phone]

## 2024-06-09 ENCOUNTER — Ambulatory Visit (INDEPENDENT_AMBULATORY_CARE_PROVIDER_SITE_OTHER): Payer: 59 | Admitting: Adult Health

## 2024-06-09 ENCOUNTER — Encounter: Payer: Self-pay | Admitting: Adult Health

## 2024-06-09 DIAGNOSIS — F331 Major depressive disorder, recurrent, moderate: Secondary | ICD-10-CM | POA: Diagnosis not present

## 2024-06-09 DIAGNOSIS — F902 Attention-deficit hyperactivity disorder, combined type: Secondary | ICD-10-CM | POA: Diagnosis not present

## 2024-06-09 MED ORDER — ATOMOXETINE HCL 80 MG PO CAPS: 80.0000 mg | ORAL_CAPSULE | Freq: Every day | ORAL | 5 refills | Status: AC

## 2024-06-09 MED ORDER — BUPROPION HCL ER (XL) 300 MG PO TB24: 300.0000 mg | ORAL_TABLET | Freq: Every day | ORAL | 1 refills | Status: AC

## 2024-06-09 NOTE — Progress Notes (Signed)
 Micheal Ayers [id number] [birth year] 63 y.o.  Subjective:   Patient ID:  Micheal Ayers is a 63 y.o. (DOB 07-11-1961) male.  Chief Complaint: No chief complaint on file.   HPI Micheal Ayers presents to the office today for follow-up of ADD and MDD.  Describes mood today as ok. Pleasant. Reports some tearfulness. Mood symptoms - reports depression sometimes. Reports lower interest and motivation.Denies anxiety. Reports irritability at times. Denies recent panic attacks. Reports some worry, rumination and over thinking. Reports mood fluctuates. Stating I feel like I'm ok, not great, but ok - 7 out of 10. Feels like current medication regimen is working well for him. Taking medications as prescribed.  Energy levels lower. Active, does not have a regular exercise routine. Enjoys some usual interests and activities. Married. Lives with wife. Daughter local. Sister in [place]. Spending time with family. Appetite adequate. Weight fluctuates. Sleeps well most nights. Averages 9 hours.  Focus and concentration it's not bad. Reports some short term memory issues - taking longer to remember things sometimes. Completing tasks. Managing aspects of household. Works full time. Denies SI or HI.  Denies AH or VH. Denies self harm. Denies substance use.  Previous medication trials:  Ritalin, Adderall, Wellbutrin , Pristiq    PHQ2-9    Flowsheet Row Office Visit from 05/07/2024 in [facility] Physical Medicine and Rehabilitation Office Visit from 03/25/2024 in [facility] Physical Medicine and Rehabilitation  PHQ-2 Total Score 0 4  PHQ-9 Total Score -- 18   Flowsheet Row Admission (Discharged) from 03/03/2024 in [facility] B ED to Hosp-Admission (Discharged) from 02/24/2024 in [facility] Admission (Discharged) from 03/13/2023 in [facility]  3C SPINE CENTER  C-SSRS RISK CATEGORY No Risk No Risk No Risk     Review of  Systems:  Review of Systems  Musculoskeletal:  Negative for gait problem.  Neurological:  Negative for tremors.  Psychiatric/Behavioral:         Please refer to HPI    Medications: I have reviewed the patient's current medications.  Current Outpatient Medications  Medication Sig Dispense Refill   acetaminophen  (TYLENOL ) 325 MG tablet Take 2 tablets (650 mg total) by mouth every 4 (four) hours as needed for mild pain (pain score 1-3).     atomoxetine  (STRATTERA ) 80 MG capsule Take 1 capsule (80 mg total) by mouth daily. 30 capsule 1   buPROPion  (WELLBUTRIN  XL) 300 MG 24 hr tablet Take 1 tablet (300 mg total) by mouth daily. 30 tablet 5   Cholecalciferol  (VITAMIN D3) 50 MCG (2000 UT) CAPS Take 2,000 Units by mouth daily.     cyanocobalamin  (VITAMIN B12) 1000 MCG tablet Take 1,000 mcg by mouth daily.     desvenlafaxine  (PRISTIQ ) 50 MG 24 hr tablet Take 50 mg by mouth daily.     olmesartan (BENICAR) 5 MG tablet Take 5 mg by mouth daily.     omeprazole (PRILOSEC OTC) 20 MG tablet Take 20 mg by mouth daily.     rosuvastatin  (CRESTOR ) 10 MG tablet Take 1 tablet (10 mg total) by mouth daily. 30 tablet 0   senna-docusate (SENOKOT-S) 8.6-50 MG tablet Take 2 tablets by mouth daily with breakfast. 60 tablet 0   tamsulosin  (FLOMAX ) 0.4 MG CAPS capsule Take 1 capsule (0.4 mg total) by mouth at bedtime. 30 capsule 0   No current facility-administered medications for this visit.    Medication Side Effects: None  Allergies:  Allergies  Allergen Reactions  Erythromycin Hives    Other Reaction(s): Unknown   Erythromycin Base Hives    Past Medical History:  Diagnosis Date   ADD (attention deficit disorder)    Allergic rhinitis, cause unspecified    Chronic kidney disease    Acute renal failure   Hemochromatosis, hereditary (HCC)    seen at [facility]   Hypertension    Major depressive disorder    Stroke [facility])     Past Medical History, Surgical history, Social history, and Family history were  reviewed and updated as appropriate.   Please see review of systems for further details on the patient's review from today.   Objective:   Physical Exam:  There were no vitals taken for this visit.  Physical Exam  Lab Review:     Component Value Date/Time   NA 133 (L) 03/11/2024 0523   K 4.2 03/11/2024 0523   CL 101 03/11/2024 0523   CO2 23 03/11/2024 0523   GLUCOSE 99 03/11/2024 0523   BUN 13 03/11/2024 0523   CREATININE 1.02 03/11/2024 0523   CALCIUM  8.2 (L) 03/11/2024 0523   PROT 5.4 (L) 03/07/2024 0618   ALBUMIN 2.7 (L) 03/07/2024 0618   AST 14 (L) 03/07/2024 0618   ALT 40 03/07/2024 0618   ALKPHOS 48 03/07/2024 0618   BILITOT 1.0 03/07/2024 0618   GFRNONAA >60 03/11/2024 0523       Component Value Date/Time   WBC 9.3 03/11/2024 0523   RBC 4.70 03/11/2024 0523   HGB 14.0 03/11/2024 0523   HCT 41.5 03/11/2024 0523   PLT 171 03/11/2024 0523   MCV 88.3 03/11/2024 0523   MCH 29.8 03/11/2024 0523   MCHC 33.7 03/11/2024 0523   RDW 13.1 03/11/2024 0523   LYMPHSABS 2.1 03/11/2024 0523   MONOABS 0.9 03/11/2024 0523   EOSABS 0.2 03/11/2024 0523   BASOSABS 0.0 03/11/2024 0523    No results found for: POCLITH, LITHIUM   No results found for: PHENYTOIN, PHENOBARB, VALPROATE, CBMZ   .res Assessment: Plan:    Plan:  PDMP reviewed  Stratera 80mg  daily Wellbutrin  XL 300mg  daily. Denies seizure history.  RTC 6 months  20 minutes spent dedicated to the care of this patient on the date of this encounter to include pre-visit review of records, ordering of medication, post visit documentation, and face-to-face time with the patient discussing depression and ADD. Discussed continuing current medication regimen.  Patient advised to contact office with any questions, adverse effects, or acute worsening in signs and symptoms.  Diagnoses and all orders for this visit:  Major depressive disorder, recurrent episode, moderate (HCC)  Attention deficit  hyperactivity disorder (ADHD), combined type     Please see After Visit Summary for patient specific instructions.  Future Appointments  Date Time Provider Department Center  06/10/2024  7:00 AM [facility] 2 [facility] [place]  06/30/2024  8:45 AM Buck Saucer, MD [facility] None  08/13/2024  9:40 AM Emeline Joesph BROCKS, DO [facility] CPR    No orders of the defined types were placed in this encounter.   -------------------------------

## 2024-06-10 ENCOUNTER — Encounter (HOSPITAL_COMMUNITY): Payer: Self-pay

## 2024-06-10 ENCOUNTER — Ambulatory Visit (HOSPITAL_COMMUNITY)

## 2024-06-13 ENCOUNTER — Ambulatory Visit (HOSPITAL_COMMUNITY)
Admission: RE | Admit: 2024-06-13 | Discharge: 2024-06-13 | Disposition: A | Discharge status: Home / Self Care | Source: Ambulatory Visit | Attending: Neurosurgery | Admitting: Neurosurgery

## 2024-06-13 DIAGNOSIS — S065XAA Traumatic subdural hemorrhage with loss of consciousness status unknown, initial encounter: Secondary | ICD-10-CM | POA: Insufficient documentation

## 2024-06-30 ENCOUNTER — Encounter: Payer: Self-pay | Admitting: Neurology

## 2024-06-30 ENCOUNTER — Ambulatory Visit (INDEPENDENT_AMBULATORY_CARE_PROVIDER_SITE_OTHER): Admitting: Neurology

## 2024-06-30 VITALS — BP 113/69 | HR 85 | Ht 72.0 in | Wt 236.0 lb

## 2024-06-30 DIAGNOSIS — R0683 Snoring: Secondary | ICD-10-CM

## 2024-06-30 DIAGNOSIS — E66811 Obesity, class 1: Secondary | ICD-10-CM | POA: Diagnosis not present

## 2024-06-30 DIAGNOSIS — G4719 Other hypersomnia: Secondary | ICD-10-CM | POA: Diagnosis not present

## 2024-06-30 DIAGNOSIS — R634 Abnormal weight loss: Secondary | ICD-10-CM

## 2024-06-30 DIAGNOSIS — R351 Nocturia: Secondary | ICD-10-CM

## 2024-06-30 DIAGNOSIS — Z9189 Other specified personal risk factors, not elsewhere classified: Secondary | ICD-10-CM

## 2024-06-30 NOTE — Patient Instructions (Signed)

## 2024-06-30 NOTE — Progress Notes (Addendum)
 Subjective:    Patient ID: Micheal Ayers is a 62 y.o. male.  HPI    True Mar, MD, PhD [facility] [address], [address] [address] [place], [place] [address]  Dear Dr. Emeline,  I saw your patient, Micheal Ayers, upon your kind request in my sleep clinic today for initial consultation of his sleep disorder, in particular, concern for underlying obstructive sleep apnea.  The patient is unaccompanied today.  As you know, Micheal Ayers is a 63 year old male with an underlying medical history of obstructive hydrocephalus with status post shunt placement in April 2024, subdural hematoma with status post craniotomy in March 2025, ADHD, depression, lumbar radiculopathy, chronic kidney disease, hemochromatosis, hypertension, history of stroke, and obesity, who reports snoring and excessive daytime somnolence.  He feels fatigued and has a hard time waking up in the mornings.  His symptoms have become worse since he had a shunt placed in 2024.  Epworth sleepiness score is 3 out of 24, fatigue severity score is 60 out of 63.  He does not typically sleep in the same bedroom as his wife.  He goes to bed generally between 9 and 9:30 PM and rise time is between 5:15 AM and 5:45 AM.  He denies recurrent nocturnal or morning headaches but has nocturia about once or twice per average night.  He is not aware of any family history of sleep apnea.  Weight has been fluctuating, he lost quite a bit of weight around the time of his craniotomy earlier this year.  He drinks quite a bit of caffeine in the form of soda or tea, up to 3-4 servings per day, soda typically in 20 ounce bottles.  He quit smoking some 30 years ago.  He has not had any alcohol since last year.   I reviewed your office note from 05/07/2024. He does not have a TV in his bedroom.  No pets in the household.  He works in Clinical biochemist.  His Past Medical History Is Significant For: Past Medical History:  Diagnosis Date    ADD (attention deficit disorder)    Allergic rhinitis, cause unspecified    Chronic kidney disease    Acute renal failure   Hemochromatosis, hereditary (HCC)    seen at [facility]   Hypertension    Major depressive disorder    Stroke [facility])     His Past Surgical History Is Significant For: Past Surgical History:  Procedure Laterality Date   CRANIOTOMY Left 02/24/2024   Procedure: CRANIOTOMY HEMATOMA EVACUATION SUBDURAL;  Surgeon: Joshua Alm Hamilton, MD;  Location: [facility] OR;  Service: Neurosurgery;  Laterality: Left;   elbow surgury  1969   left elbow   INGUINAL HERNIA REPAIR  2003   left   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/2023   Procedure: Shunt Placment - right occipital;  Surgeon: Louis Shove, MD;  Location: [facility] OR;  Service: Neurosurgery;  Laterality: Right;    His Family History Is Significant For: Family History  Problem Relation Age of Onset   Hypertension Other    Alcohol abuse Other    Hypertension Other    Kidney disease Other    Cancer Other    Alcohol abuse Other    Arthritis Other    Stroke Other    Hypertension Other    Cancer Other    Sleep apnea Neg Hx     His Social History Is Significant For: Social History   Socioeconomic History   Marital status: Married  Spouse name: Not on file   Number of children: Not on file   Years of education: Not on file   Highest education level: Not on file  Occupational History   Not on file  Tobacco Use   Smoking status: Former   Smokeless tobacco: Never  Vaping Use   Vaping status: Never Used  Substance and Sexual Activity   Alcohol use: Not Currently    Comment: social   Drug use: No   Sexual activity: Not on file  Other Topics Concern   Not on file  Social History Narrative   Pt works    Pt lives with wife    Social Drivers of Corporate investment banker Strain: Not on file  Food Insecurity: No Food Insecurity (02/24/2024)   Hunger Vital Sign    Worried About Running Out of Food in the Last Year:  Never true    Ran Out of Food in the Last Year: Never true  Transportation Needs: No Transportation Needs (02/24/2024)   PRAPARE - Administrator, Civil Service (Medical): No    Lack of Transportation (Non-Medical): No  Physical Activity: Not on file  Stress: Not on file  Social Connections: Not on file    His Allergies Are:  Allergies  Allergen Reactions   Erythromycin Hives    Other Reaction(s): Unknown   Erythromycin Base Hives  :   His Current Medications Are:  Outpatient Encounter Medications as of 06/30/2024  Medication Sig   atomoxetine  (STRATTERA ) 80 MG capsule Take 1 capsule (80 mg total) by mouth daily.   buPROPion  (WELLBUTRIN  XL) 300 MG 24 hr tablet Take 1 tablet (300 mg total) by mouth daily.   Cholecalciferol  (VITAMIN D3) 50 MCG (2000 UT) CAPS Take 2,000 Units by mouth daily.   cyanocobalamin  (VITAMIN B12) 1000 MCG tablet Take 1,000 mcg by mouth daily.   desvenlafaxine  (PRISTIQ ) 50 MG 24 hr tablet Take 50 mg by mouth daily.   olmesartan (BENICAR) 5 MG tablet Take 5 mg by mouth daily.   omeprazole (PRILOSEC OTC) 20 MG tablet Take 20 mg by mouth daily.   rosuvastatin  (CRESTOR ) 10 MG tablet Take 1 tablet (10 mg total) by mouth daily.   acetaminophen  (TYLENOL ) 325 MG tablet Take 2 tablets (650 mg total) by mouth every 4 (four) hours as needed for mild pain (pain score 1-3).   senna-docusate (SENOKOT-S) 8.6-50 MG tablet Take 2 tablets by mouth daily with breakfast.   tamsulosin  (FLOMAX ) 0.4 MG CAPS capsule Take 1 capsule (0.4 mg total) by mouth at bedtime.   No facility-administered encounter medications on file as of 06/30/2024.  :   Review of Systems:  Out of a complete 14 point review of systems, all are reviewed and negative with the exception of these symptoms as listed below:  Review of Systems  Neurological:        Pt here for sleep consult Pt snores,fatigue,hypertension,headaches Pt denies sleep study, cpap machine    ESS;3 FSS:60     Objective:   Neurological Exam  Physical Exam Physical Examination:   Vitals:   06/30/24 0842  BP: 113/69  Pulse: 85    General Examination: The patient is a very pleasant 63 y.o. male in no acute distress. He appears well-developed and well-nourished and well groomed.   HEENT: Benign and inconspicuous scar left parietal area.  VP shunt in place, on the right.  Pupils are equal, round and reactive to light, extraocular tracking is good without limitation to gaze  excursion or nystagmus noted. Hearing is grossly intact. Face is symmetric with normal facial animation. Speech is clear with no dysarthria noted. There is no hypophonia. There is no lip, neck/head, jaw or voice tremor. Neck is supple with full range of passive and active motion. There are no carotid bruits on auscultation. Oropharynx exam reveals: mild mouth dryness, adequate dental hygiene and mild airway crowding, due to small airway entry, tonsils smaller, Mallampati class III, neck circumference 18 1/4 inches, no significant overbite.  Tongue protrudes centrally and palate elevates symmetrically.   Chest: Clear to auscultation without wheezing, rhonchi or crackles noted.  Heart: S1+S2+0, regular and normal without murmurs, rubs or gallops noted.   Abdomen: Soft, non-tender and non-distended.  Extremities: There is no pitting edema in the distal lower extremities bilaterally.   Skin: Warm and dry without trophic changes noted.   Musculoskeletal: exam reveals no obvious joint deformities.   Neurologically:  Mental status: The patient is awake, alert and oriented in all 4 spheres. His immediate and remote memory, attention, language skills and fund of knowledge are appropriate. There is no evidence of aphasia, agnosia, apraxia or anomia. Speech is clear with normal prosody and enunciation. Thought process is linear. Mood is normal and affect is normal.  Cranial nerves II - XII are as described above under HEENT exam.  Motor exam: Normal  bulk, moving all 4 extremities without restriction, no obvious action or resting tremor.  Fine motor skills and coordination: grossly intact.  Cerebellar testing: No dysmetria or intention tremor. There is no truncal or gait ataxia.  Sensory exam: intact to light touch in the upper and lower extremities.  Gait, station and balance: He stands easily. No veering to one side is noted. No leaning to one side is noted. Posture is age-appropriate and stance is narrow based. Gait shows normal stride length and normal pace. No problems turning are noted.   Assessment and Plan:  In summary, Micheal Ayers is a very pleasant 63 y.o.-year old male with an underlying medical history of obstructive hydrocephalus with status post shunt placement in April 2024, subdural hematoma with status post craniotomy in March 2025, ADHD, depression, lumbar radiculopathy, chronic kidney disease, hemochromatosis, hypertension, history of stroke, and obesity, whose history and physical exam are concerning for sleep disordered breathing, particularly obstructive sleep apnea (OSA). A laboratory attended sleep study is typically considered gold standard for evaluation of sleep disordered breathing.   I had a long chat with the patient about my findings and the diagnosis of sleep apnea, particularly OSA, its prognosis and treatment options. We talked about medical/conservative treatments, surgical interventions and non-pharmacological approaches for symptom control. I explained, in particular, the risks and ramifications of untreated moderate to severe OSA, especially with respect to developing cardiovascular disease down the road, including congestive heart failure (CHF), difficult to treat hypertension, cardiac arrhythmias (particularly A-fib), neurovascular complications including TIA, stroke and dementia. Even type 2 diabetes has, in part, been linked to untreated OSA. Symptoms of untreated OSA may include (but may not be limited  to) daytime sleepiness, nocturia (i.e. frequent nighttime urination), memory problems, mood irritability and suboptimally controlled or worsening mood disorder such as depression and/or anxiety, lack of energy, lack of motivation, physical discomfort, as well as recurrent headaches, especially morning or nocturnal headaches. We talked about the importance of maintaining a healthy lifestyle and striving for healthy weight. In addition, we talked about the importance of striving for and maintaining good sleep hygiene. I recommended a sleep study at  this time. I outlined the differences between a laboratory attended sleep study which is considered more comprehensive and accurate over the option of a home sleep test (HST); the latter may lead to underestimation of sleep disordered breathing in some instances and does not help with diagnosing upper airway resistance syndrome and is not accurate enough to diagnose primary central sleep apnea typically. I outlined possible surgical and non-surgical treatment options of OSA, including the use of a positive airway pressure (PAP) device (i.e. CPAP, AutoPAP/APAP or BiPAP in certain circumstances), a custom-made dental device (aka oral appliance, which would require a referral to a specialist dentist or orthodontist typically, and is generally speaking not considered for patients with full dentures or edentulous state), upper airway surgical options, such as traditional UPPP (which is not considered a first-line treatment) or the Inspire device (hypoglossal nerve stimulator, which would involve a referral for consultation with an ENT surgeon, after careful selection, following inclusion criteria - also not first-line treatment). I explained the PAP treatment option to the patient in detail, as this is generally considered first-line treatment.  The patient indicated that he would be willing to try PAP therapy, if the need arises. I explained the importance of being compliant  with PAP treatment, not only for insurance purposes but primarily to improve patient's symptoms symptoms, and for the patient's long term health benefit, including to reduce His cardiovascular risks longer-term.    We will pick up our discussion about the next steps and treatment options after testing.  We will keep him posted as to the test results by phone call and/or MyChart messaging where possible.  We will plan to follow-up in sleep clinic accordingly as well.  I answered all his questions today and the patient was in agreement.   I encouraged him to call with any interim questions, concerns, problems or updates or email us  through MyChart.  Generally speaking, sleep test authorizations may take up to 2 weeks, sometimes less, sometimes longer, the patient is encouraged to get in touch with us  if they do not hear back from the sleep lab staff directly within the next 2 weeks.  Thank you very much for allowing me to participate in the care of this nice patient. If I can be of any further assistance to you please do not hesitate to call me at [phone].  Sincerely,   True Mar, MD, PhD

## 2024-07-04 ENCOUNTER — Telehealth: Payer: Self-pay | Admitting: Neurology

## 2024-07-04 NOTE — Telephone Encounter (Signed)
 NPSG [employer] and [facility] both pending.

## 2024-07-07 NOTE — Telephone Encounter (Signed)
 Checked status still pending with both insurances.

## 2024-07-14 NOTE — Telephone Encounter (Signed)
 NPSG [employer] still pending [employer] denied NPSG

## 2024-07-21 NOTE — Telephone Encounter (Signed)
 Checked status on the portal for the [employer] it is still pending for the NPSG.

## 2024-07-29 NOTE — Telephone Encounter (Signed)
 Checked status on the portal it ist sill pending.

## 2024-07-31 NOTE — Telephone Encounter (Signed)
 Checked status on the portal it is still pending

## 2024-08-04 NOTE — Telephone Encounter (Signed)
 [employer]: [id number] (exp. 07/04/24 to 01/27/25)

## 2024-08-06 ENCOUNTER — Ambulatory Visit: Admitting: Physical Medicine and Rehabilitation

## 2024-08-06 NOTE — Telephone Encounter (Signed)
 NPSG [employer]: [id number] (exp. 07/04/24 to 01/27/25) [employer] denied   Patient is scheduled at [facility] for 09/02/24 at 9 pm.  Mailed packet and sent mychart.

## 2024-08-06 NOTE — Telephone Encounter (Signed)
 I called the patient he did not pick up. I left a voicemail asking him to call back to schedule his SS. I left my direct number.

## 2024-08-13 ENCOUNTER — Encounter: Attending: Registered Nurse | Admitting: Physical Medicine and Rehabilitation

## 2024-08-13 VITALS — BP 126/88 | HR 79 | Ht 72.0 in | Wt 236.0 lb

## 2024-08-13 DIAGNOSIS — I69119 Unspecified symptoms and signs involving cognitive functions following nontraumatic intracerebral hemorrhage: Secondary | ICD-10-CM | POA: Diagnosis present

## 2024-08-13 DIAGNOSIS — R5383 Other fatigue: Secondary | ICD-10-CM | POA: Insufficient documentation

## 2024-08-13 DIAGNOSIS — S065XAA Traumatic subdural hemorrhage with loss of consciousness status unknown, initial encounter: Secondary | ICD-10-CM | POA: Diagnosis not present

## 2024-08-13 DIAGNOSIS — R2689 Other abnormalities of gait and mobility: Secondary | ICD-10-CM | POA: Diagnosis present

## 2024-08-13 NOTE — Progress Notes (Signed)
 Subjective:    Patient ID: Micheal Ayers, male    DOB: 1961/07/09, 63 y.o.   MRN: [id number]  HPI  Micheal Ayers is a 63 y.o. year old male  who  has a past medical history of ADD (attention deficit disorder), Allergic rhinitis, cause unspecified, Chronic kidney disease, Hemochromatosis, hereditary, Hypertension, Major depressive disorder, and Stroke (HCC).   They are presenting to PM&R clinic for follow up related to  left SDH s/p craniotomy 02/24/24, VPS 03/13/23, and IRF admission 4/7-4/17/25 .  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   Will provide letter for work related rest breaks, recommending at least 3x 20-minute breaks per shift to improve fatigue.  Will provide letter, although patient notes that he is unlikely to comply with accommodations.   Follow-up in 3 months   History of snoring Fatigue, unspecified type Discussed that sleep apnea is more common in brain injury population; will send referral to sleep study   Interval Hx: - Graduated from speech therapy. - Follow-up with neurology sleep specialist scheduled for a sleep study in early October. - Reports poor sleep quality. In bed for 8-9 hours, waking at least once nightly. Denies daytime naps or falling asleep while driving. - Reports some instances of lifting more than 25 pounds at work but has mostly avoided ladders. - Independent with gait, no assistive device used. - Reports continued slow, steady progress with balance. Experiences unsteadiness with rapid movements but denies falls. - Independent with ADLs including bathing, cooking, and cleaning. Driving to work without issue. -  Reports infrequent headaches, taking seizure medication approximately twice per month for them. - Reports persistent short-term memory deficits, such as forgetting if he has taken his morning medication minutes later. Uses a medication tray to manage this. Long-term memory is intact. - Mood is described as bland with improved control over mood swings. Denies worsening anxiety or depression. - Reports persistent numbness on the left side of his head post-surgery, without associated pain. - He is managing his own medications. He sees a psychiatrist for ADD medication. - Denies interest in a neuropsychology referral at this time for memory concerns.  Pain Inventory Average Pain 1 Pain Right Now 1 My pain is dull  In the last 24 hours, has pain interfered with the following? General activity 0 Relation with others 0 Enjoyment of life 1 What TIME of day is your pain at its worst? evening Sleep (in general) Fair  Pain is worse with: unsure and some activites Pain improves with: rest Relief from Meds: 1  Family History  Problem Relation Age of Onset   Hypertension Other    Alcohol abuse Other    Hypertension Other    Kidney disease Other    Cancer Other    Alcohol abuse Other    Arthritis Other    Stroke Other    Hypertension Other    Cancer Other    Sleep apnea Neg Hx    Social History   Socioeconomic History   Marital status: Married    Spouse name: Not on file   Number of children: Not on file   Years of education: Not on file   Highest education level: Not on file  Occupational History   Not on file  Tobacco Use   Smoking status: Former   Smokeless tobacco: Never  Vaping Use   Vaping status: Never Used  Substance and Sexual Activity   Alcohol use: Not Currently    Comment: social   Drug use: No   Sexual activity: Not on file  Other Topics Concern   Not on file  Social History Narrative   Pt works    Pt lives with wife    Social Drivers of Manufacturing engineer Strain: Not on file  Food Insecurity: No Food Insecurity (02/24/2024)   Hunger Vital Sign    Worried About Running Out of Food in the Last Year: Never true    Ran Out of Food in the Last Year: Never true  Transportation Needs: No Transportation Needs (02/24/2024)   PRAPARE - Administrator, Civil Service (Medical): No    Lack of Transportation (Non-Medical): No  Physical Activity: Not on file  Stress: Not on file  Social Connections: Not on file   Past Surgical History:  Procedure Laterality Date   CRANIOTOMY Left 02/24/2024   Procedure: CRANIOTOMY HEMATOMA EVACUATION SUBDURAL;  Surgeon: Joshua Alm Hamilton, MD;  Location: [facility] OR;  Service: Neurosurgery;  Laterality: Left;   elbow surgury  1969   left elbow   INGUINAL HERNIA REPAIR  2003   left   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/2023   Procedure: Shunt Placment - right occipital;  Surgeon: Louis Shove, MD;  Location: [facility] OR;  Service: Neurosurgery;  Laterality: Right;   Past Surgical History:  Procedure Laterality Date   CRANIOTOMY Left 02/24/2024   Procedure: CRANIOTOMY HEMATOMA EVACUATION SUBDURAL;  Surgeon: Joshua Alm Hamilton, MD;  Location: [facility] OR;  Service: Neurosurgery;  Laterality: Left;   elbow surgury  1969   left elbow   INGUINAL HERNIA REPAIR  2003   left   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/2023   Procedure: Shunt Placment - right occipital;  Surgeon: Louis Shove, MD;  Location: [facility] OR;  Service: Neurosurgery;  Laterality: Right;   Past Medical History:  Diagnosis Date   ADD (attention deficit disorder)    Allergic rhinitis, cause unspecified    Chronic kidney disease    Acute renal failure   Hemochromatosis, hereditary (HCC)    seen at [facility]   Hypertension    Major depressive disorder    Stroke (HCC)    BP 126/88   Pulse 79   Ht 6' (1.829 m)   Wt 236 lb (107 kg)   SpO2 94%   BMI 32.01 kg/m   Opioid Risk Score:   Fall Risk Score:  `1  Depression screen [facility] 2/9      08/13/2024    9:43 AM 05/07/2024    9:57 AM 03/25/2024    1:42 PM  Depression screen PHQ 2/9  Decreased Interest 0 0 2  Down, Depressed, Hopeless 0 0 2  PHQ - 2 Score 0 0 4  Altered sleeping   1  Tired, decreased energy   3  Change in appetite   1  Feeling bad or failure about yourself    3  Trouble concentrating   2  Moving slowly or fidgety/restless   3  Suicidal thoughts   1  PHQ-9 Score   18  Difficult doing work/chores   Somewhat difficult    Review of Systems  Musculoskeletal:  Positive for back pain.       B/L knee pain  All other systems  reviewed and are negative.      Objective:   Physical Exam  PE: Constitution: Appropriate appearance for age. No apparent distress   Resp: No respiratory distress. No accessory muscle usage. on RA and CTAB Cardio: Well perfused appearance.  No peripheral edema. Abdomen: Nondistended. Nontender.   Psych: Appropriate mood and affect.  Mildly anxious, pressured. Neuro: AAOx4. No apparent cognitive deficits.  Names and remembers 3 out of 3 items on testing.  Can perform complex problem-solving.    Neurologic Exam:   No Dizziness with VOR suppression testing, no apparent nystagmus DTRs: Reflexes were 2+ in bilateral achilles, patella, biceps, BR and triceps. Babinsky: flexor responses b/l.   Hoffmans: negative b/l Sensory exam: revealed normal sensation in all dermatomal regions in bilateral upper extremities and bilateral lower extremities Motor exam: strength 5/5 throughout bilateral upper extremities and bilateral lower extremities Coordination: Fine motor coordination was normal.  Mild balance deficits with single-leg stance. Gait: Occassional L foot drop/catch but overall stable       Assessment & Plan:   Micheal Ayers is a 62 y.o. year old male  who  has a past medical history of ADD (attention deficit disorder), Allergic rhinitis, cause unspecified, Chronic kidney disease, Hemochromatosis, hereditary, Hypertension, Major  depressive disorder, and Stroke (HCC).   They are presenting to PM&R clinic for follow up related to  left SDH s/p craniotomy 02/24/24, VPS 03/13/23, and IRF admission 4/7-4/17/25 .    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 - Declined neuropsychology referral at this time. Will reconsider if frustration increases.  Impairment of balance - Continue to monitor work restrictions. Advised to avoid lifting over 25 lbs and use caution with ladders. - Advised to walk slowly and concentrate on gait to minimize risk of falls due to balance deficits and foot drop. - Referral to [facility] for balance and vestibular training with a home exercise program. - Offered information on a brain injury support group. - Follow up in 6 months to re-evaluate balance and memory.    Fatigue  Continue with plan for sleep study in October   Continue following with psychiatry  This visit note was generated with the assistance of an AI scribe. Patient was informed of the use of a scribe and consented to its use before recording. The AI system processes and transcribes spoken words into text, contributing to the creation of my medical records. The AI scribe will not be used to make any decisions about patient care. While the visit note has been reviewed for errors, please inform the office of any inaccuracies.

## 2024-08-19 DIAGNOSIS — R2689 Other abnormalities of gait and mobility: Secondary | ICD-10-CM | POA: Insufficient documentation

## 2024-09-01 ENCOUNTER — Other Ambulatory Visit: Payer: Self-pay

## 2024-09-01 ENCOUNTER — Ambulatory Visit: Attending: Physical Medicine and Rehabilitation

## 2024-09-01 DIAGNOSIS — R2689 Other abnormalities of gait and mobility: Secondary | ICD-10-CM | POA: Diagnosis present

## 2024-09-01 DIAGNOSIS — R262 Difficulty in walking, not elsewhere classified: Secondary | ICD-10-CM | POA: Insufficient documentation

## 2024-09-01 DIAGNOSIS — R2681 Unsteadiness on feet: Secondary | ICD-10-CM | POA: Insufficient documentation

## 2024-09-01 DIAGNOSIS — M6281 Muscle weakness (generalized): Secondary | ICD-10-CM | POA: Insufficient documentation

## 2024-09-01 DIAGNOSIS — S065XAA Traumatic subdural hemorrhage with loss of consciousness status unknown, initial encounter: Secondary | ICD-10-CM | POA: Diagnosis not present

## 2024-09-01 NOTE — Therapy (Signed)
 OUTPATIENT PHYSICAL THERAPY NEURO EVALUATION   Patient Name: Micheal Ayers MRN: [id number] DOB:12/29/1960, 63 y.o., male Today's Date: 09/01/2024   PCP: Verena Mems, MD REFERRING PROVIDER: Emeline Joesph BROCKS, DO  END OF SESSION:  PT End of Session - 09/01/24 0803     Visit Number 1    Number of Visits 7    Date for Recertification  10/20/24    Authorization Type Aet[employer]    PT Start Time 0802    PT Stop Time 0845    PT Time Calculation (min) 43 min          Past Medical History:  Diagnosis Date   ADD (attention deficit disorder)    Allergic rhinitis, cause unspecified    Chronic kidney disease    Acute renal failure   Hemochromatosis, hereditary    seen at [facility]   Hypertension    Major depressive disorder    Stroke [facility])    Past Surgical History:  Procedure Laterality Date   CRANIOTOMY Left 02/24/2024   Procedure: CRANIOTOMY HEMATOMA EVACUATION SUBDURAL;  Surgeon: Joshua Alm Hamilton, MD;  Location: [facility] OR;  Service: Neurosurgery;  Laterality: Left;   elbow surgury  1969   left elbow   INGUINAL HERNIA REPAIR  2003   left   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/2023   Procedure: Shunt Placment - right occipital;  Surgeon: Louis Shove, MD;  Location: [facility] OR;  Service: Neurosurgery;  Laterality: Right;   Patient Active Problem List   Diagnosis Date Noted   Impairment of balance 08/19/2024   History of snoring 05/16/2024   Hyponatremia 03/17/2024   GERD (gastroesophageal reflux disease) 03/17/2024   Anxiety disorder with panic attacks 03/17/2024   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 03/11/2024   Subdural hematoma (HCC) 02/24/2024   S/P craniotomy 02/24/2024   Obstructive hydrocephalus (HCC) 03/13/2023   Ischemic cerebrovascular accident (CVA) (HCC) 02/19/2023   Cerebral ventriculomegaly 02/19/2023   Acute renal failure superimposed on stage 2 chronic kidney disease 02/19/2023   Hypertensive urgency 02/19/2023   Abnormal CXR  02/19/2023   CVA (cerebral vascular accident) (HCC) 02/19/2023   Fatigue 09/20/2012   Family history of hemochromatosis 09/20/2012   Preventative health care 09/06/2012   ADD (attention deficit disorder)    Allergic rhinitis     ONSET DATE: 02/2024  REFERRING DIAG: D93.5XAA (ICD-10-CM) - Subdural hematoma (HCC) R26.89 (ICD-10-CM) - Impairment of balance  THERAPY DIAG:  Muscle weakness (generalized)  Unsteadiness on feet  Difficulty in walking, not elsewhere classified  Rationale for Evaluation and Treatment: Rehabilitation  SUBJECTIVE:  SUBJECTIVE STATEMENT: Still having issues with fatigue and gait deviations.  Finding that still scuffing the left foot with walking. Has some trepidation with climbing ladders at work. No falls experienced and no new medical issues/neuro issues reported. Denies any dizziness or overt imbalance. Notes some instances of knee pain since his gait pattern has been affected.  Pt accompanied by: self  PERTINENT HISTORY: hx of SDH  PAIN:  Are you having pain? Lower back has been a challenge, notes stiffness in AM and left knee pain/crepitus with activity  PRECAUTIONS: None  RED FLAGS: None   WEIGHT BEARING RESTRICTIONS: No  FALLS: Has patient fallen in last 6 months? No  LIVING ENVIRONMENT: Lives with: lives with their family Lives in: House/apartment Stairs: stairs to enter home Has following equipment at home: None  PLOF: Independent  PATIENT GOALS: improve walking pattern and balance and fatigue.   OBJECTIVE:  Note: Objective measures were completed at Evaluation unless otherwise noted.  DIAGNOSTIC FINDINGS:   COGNITION: Overall cognitive status: Within functional limits for tasks assessed   SENSATION: Not tested  COORDINATION: Decreased  speed LLE/LUE to rapid alternating movements  EDEMA:  none  MUSCLE TONE: NT     POSTURE: No Significant postural limitations  LOWER EXTREMITY ROM:     WNL  LOWER EXTREMITY MMT:    MMT Right Eval Left Eval  Hip flexion    Hip extension    Hip abduction    Hip adduction    Hip internal rotation    Hip external rotation    Knee flexion 5 5  Knee extension 5 5  Ankle dorsiflexion 5 4  Ankle plantarflexion 4 3+  Ankle inversion    Ankle eversion    (Blank rows = not tested)  BED MOBILITY:  indep  TRANSFERS: indep   CURB:  Findings: indep  STAIRS: Independent but unsteady without HR GAIT: Findings: Comments: decreased left ankle DF/foot clearance with instances of circumduction  FUNCTIONAL TESTS:  5 times sit to stand: 14 sec Mini-BESTest: 22/28 Single leg stance: 2-3 sec LLE; 3-5 sec RLE M-CTSIB  Condition 1: Firm Surface, EO 30 Sec, Normal Sway  Condition 2: Firm Surface, EC 30 Sec, Normal Sway  Condition 3: Foam Surface, EO 30 Sec, Normal Sway  Condition 4: Foam Surface, EC 18 Sec, Mild and Moderate Sway                                                                                                                                  TREATMENT DATE: 09/01/24    PATIENT EDUCATION: Education details: assessment details, discussed tenets of POC with pt and constructing goals. Outlined progressive walking program to address fatigue with HR thresholds as reference Person educated: Patient Education method: Explanation Education comprehension: verbalized understanding  HOME EXERCISE PROGRAM: TBD  GOALS: Goals reviewed with patient? Yes  SHORT TERM GOALS: Target date: same as LTG   LONG TERM GOALS: Target date: 10/20/2024  Patient will be independent in HEP to improve functional outcomes Baseline:  Goal status: INITIAL  2.  Improve Mini-BESTest score to 26/28 for improved balance and reduced fall risk Baseline: 22/28 Goal status:  INITIAL  3.  Demo/report ability to lift/carry bulky/heavy items up/down stairs to improve safety at work/home Baseline: HR needed Goal status: INITIAL  4.  Improve single limb stance to 20 sec LLE/RLE to reduce risk for falls and improve single limb stance Baseline:  Goal status: INITIAL  5.  Demo/report improved left ankle dorsiflexion for foot clearance during gait Baseline: reports frequent scuffing left foot/tripping Goal status: INITIAL  6. TBD  Baseline: TBD  Goal status: INITIAL   ASSESSMENT:  CLINICAL IMPRESSION: Patient is a 63 y.o. male who was seen today for physical therapy evaluation and treatment for R26.89 (ICD-10-CM) - Impairment of balance.  Exhibits decreased left ankle strength and reduced foot clearance during swing phase w/ subsequent gait deviations. Balance impairments with limited control with m-CTSIB condition 4 and limited single leg stance balance bilaterally L > R.  Deficits with Mini-BESTest revealed requiring multiple steps with anterior/posterior LOB due to delayed righting reactions.  Reports ongoing fatigue limiting activity tolerance and work duties.   OBJECTIVE IMPAIRMENTS: Abnormal gait, decreased activity tolerance, decreased balance, decreased endurance, difficulty walking, decreased strength, and pain.   ACTIVITY LIMITATIONS: carrying, lifting, stairs, and locomotion level  PARTICIPATION LIMITATIONS: cleaning, community activity, and occupation  PERSONAL FACTORS: Age, Time since onset of injury/illness/exacerbation, and 1-2 comorbidities: PMH are also affecting patient's functional outcome.   REHAB POTENTIAL: Excellent  CLINICAL DECISION MAKING: Evolving/moderate complexity  EVALUATION COMPLEXITY: Moderate  PLAN:  PT FREQUENCY: 1x/week  PT DURATION: 6 weeks  PLANNED INTERVENTIONS: 97750- Physical Performance Testing, 97110-Therapeutic exercises, 97530- Therapeutic activity, [id number]- Neuromuscular re-education, 97535- Self Care,  97140- Manual therapy, [id number]- Gait training, 02239- Orthotic Initial, [id number]- Orthotic/Prosthetic subsequent, [phone]- Canalith repositioning, 02886- Aquatic Therapy, and Patient/Family education  PLAN FOR NEXT SESSION: foot-up for left, reaction balance, and goal, HEP development for balance. Higher level balance challenges such as walking/carrying large items up/down steps/ladder, etc   9:04 AM, 09/01/24 M. Kelly Caidence Kaseman, PT, DPT Physical Therapist- [place] Office Number: [phone]

## 2024-09-02 ENCOUNTER — Ambulatory Visit (INDEPENDENT_AMBULATORY_CARE_PROVIDER_SITE_OTHER): Admitting: Neurology

## 2024-09-02 DIAGNOSIS — R0683 Snoring: Secondary | ICD-10-CM | POA: Diagnosis not present

## 2024-09-02 DIAGNOSIS — Z9189 Other specified personal risk factors, not elsewhere classified: Secondary | ICD-10-CM

## 2024-09-02 DIAGNOSIS — G4719 Other hypersomnia: Secondary | ICD-10-CM

## 2024-09-02 DIAGNOSIS — E66811 Obesity, class 1: Secondary | ICD-10-CM

## 2024-09-02 DIAGNOSIS — R351 Nocturia: Secondary | ICD-10-CM

## 2024-09-02 DIAGNOSIS — G4733 Obstructive sleep apnea (adult) (pediatric): Secondary | ICD-10-CM

## 2024-09-02 DIAGNOSIS — G4761 Periodic limb movement disorder: Secondary | ICD-10-CM

## 2024-09-02 DIAGNOSIS — G4720 Circadian rhythm sleep disorder, unspecified type: Secondary | ICD-10-CM

## 2024-09-02 DIAGNOSIS — R634 Abnormal weight loss: Secondary | ICD-10-CM

## 2024-09-10 ENCOUNTER — Ambulatory Visit

## 2024-09-10 DIAGNOSIS — R262 Difficulty in walking, not elsewhere classified: Secondary | ICD-10-CM

## 2024-09-10 DIAGNOSIS — R2681 Unsteadiness on feet: Secondary | ICD-10-CM

## 2024-09-10 DIAGNOSIS — M6281 Muscle weakness (generalized): Secondary | ICD-10-CM | POA: Diagnosis not present

## 2024-09-10 NOTE — Therapy (Signed)
 OUTPATIENT PHYSICAL THERAPY NEURO TREATMENT   Patient Name: Micheal Ayers MRN: [id number] DOB:01-06-1961, 63 y.o., male Today's Date: 09/10/2024   PCP: Verena Mems, MD REFERRING PROVIDER: Emeline Joesph BROCKS, DO  END OF SESSION:  PT End of Session - 09/10/24 0937     Visit Number 2    Number of Visits 7    Date for Recertification  10/20/24    Authorization Type Aetna    PT Start Time 0930    PT Stop Time 1015    PT Time Calculation (min) 45 min          Past Medical History:  Diagnosis Date   ADD (attention deficit disorder)    Allergic rhinitis, cause unspecified    Chronic kidney disease    Acute renal failure   Hemochromatosis, hereditary    seen at [facility]   Hypertension    Major depressive disorder    Stroke [facility])    Past Surgical History:  Procedure Laterality Date   CRANIOTOMY Left 02/24/2024   Procedure: CRANIOTOMY HEMATOMA EVACUATION SUBDURAL;  Surgeon: Joshua Alm Hamilton, MD;  Location: [facility] OR;  Service: Neurosurgery;  Laterality: Left;   elbow surgury  1969   left elbow   INGUINAL HERNIA REPAIR  2003   left   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/2023   Procedure: Shunt Placment - right occipital;  Surgeon: Louis Shove, MD;  Location: [facility] OR;  Service: Neurosurgery;  Laterality: Right;   Patient Active Problem List   Diagnosis Date Noted   Impairment of balance 08/19/2024   History of snoring 05/16/2024   Hyponatremia 03/17/2024   GERD (gastroesophageal reflux disease) 03/17/2024   Anxiety disorder with panic attacks 03/17/2024   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 03/11/2024   Subdural hematoma (HCC) 02/24/2024   S/P craniotomy 02/24/2024   Obstructive hydrocephalus (HCC) 03/13/2023   Ischemic cerebrovascular accident (CVA) (HCC) 02/19/2023   Cerebral ventriculomegaly 02/19/2023   Acute renal failure superimposed on stage 2 chronic kidney disease 02/19/2023   Hypertensive urgency 02/19/2023   Abnormal CXR  02/19/2023   CVA (cerebral vascular accident) (HCC) 02/19/2023   Fatigue 09/20/2012   Family history of hemochromatosis 09/20/2012   Preventative health care 09/06/2012   ADD (attention deficit disorder)    Allergic rhinitis     ONSET DATE: 02/2024  REFERRING DIAG: D93.5XAA (ICD-10-CM) - Subdural hematoma (HCC) R26.89 (ICD-10-CM) - Impairment of balance  THERAPY DIAG:  Muscle weakness (generalized)  Unsteadiness on feet  Difficulty in walking, not elsewhere classified  Rationale for Evaluation and Treatment: Rehabilitation  SUBJECTIVE:  SUBJECTIVE STATEMENT: Doing ok no new issues Pt accompanied by: self  PERTINENT HISTORY: hx of SDH  PAIN:  Are you having pain? Lower back has been a challenge, notes stiffness in AM and left knee pain/crepitus with activity  PRECAUTIONS: None  RED FLAGS: None   WEIGHT BEARING RESTRICTIONS: No  FALLS: Has patient fallen in last 6 months? No  LIVING ENVIRONMENT: Lives with: lives with their family Lives in: House/apartment Stairs: stairs to enter home Has following equipment at home: None  PLOF: Independent  PATIENT GOALS: improve walking pattern and balance and fatigue.   OBJECTIVE:   TODAY'S TREATMENT: 09/10/24 Activity Comments  Standing tib raises 3x10   Standing march w/ resistance 3x10 green  Sidestep w/ green loop x 2 min Around toes  Heel walking Limited left anlke eccentric control  Step-ups/backwards 2x10 15# unilat front rack hold/shoulder hold  Reactive balance     PATIENT EDUCATION: Education details: HEP initiation and rationale Education method: Explanation Education comprehension: verbalized understanding  HOME EXERCISE PROGRAM: Access Code: [email or web address] URL: [email or web address] Date:  09/10/2024 Prepared by: Kelly Earlie Schank  Exercises - Standing Bilateral Ankle Dorsiflexion AROM  - 2-3 x weekly - 3 sets - 10 reps - 2 sec hold - Marching with Resistance  - 1 x daily - 2-3 x weekly - 3 sets - 10 reps - Side Stepping with Resistance at Feet  - 1 x daily - 2-3 x weekly - 1-3 sets - 2 min hold Note: Objective measures were completed at Evaluation unless otherwise noted.  DIAGNOSTIC FINDINGS:   COGNITION: Overall cognitive status: Within functional limits for tasks assessed   SENSATION: Not tested  COORDINATION: Decreased speed LLE/LUE to rapid alternating movements  EDEMA:  none  MUSCLE TONE: NT     POSTURE: No Significant postural limitations  LOWER EXTREMITY ROM:     WNL  LOWER EXTREMITY MMT:    MMT Right Eval Left Eval  Hip flexion    Hip extension    Hip abduction    Hip adduction    Hip internal rotation    Hip external rotation    Knee flexion 5 5  Knee extension 5 5  Ankle dorsiflexion 5 4  Ankle plantarflexion 4 3+  Ankle inversion    Ankle eversion    (Blank rows = not tested)  BED MOBILITY:  indep  TRANSFERS: indep   CURB:  Findings: indep  STAIRS: Independent but unsteady without HR GAIT: Findings: Comments: decreased left ankle DF/foot clearance with instances of circumduction  FUNCTIONAL TESTS:  5 times sit to stand: 14 sec Mini-BESTest: 22/28 Single leg stance: 2-3 sec LLE; 3-5 sec RLE M-CTSIB  Condition 1: Firm Surface, EO 30 Sec, Normal Sway  Condition 2: Firm Surface, EC 30 Sec, Normal Sway  Condition 3: Foam Surface, EO 30 Sec, Normal Sway  Condition 4: Foam Surface, EC 18 Sec, Mild and Moderate Sway  TREATMENT DATE: 09/01/24     GOALS: Goals reviewed with patient? Yes  SHORT TERM GOALS: Target date: same as LTG   LONG TERM GOALS: Target date: 10/20/2024    Patient will  be independent in HEP to improve functional outcomes Baseline:  Goal status: INITIAL  2.  Improve Mini-BESTest score to 26/28 for improved balance and reduced fall risk Baseline: 22/28 Goal status: INITIAL  3.  Demo/report ability to lift/carry bulky/heavy items up/down stairs to improve safety at work/home Baseline: HR needed Goal status: INITIAL  4.  Improve single limb stance to 20 sec LLE/RLE to reduce risk for falls and improve single limb stance Baseline:  Goal status: INITIAL  5.  Demo/report improved left ankle dorsiflexion for foot clearance during gait Baseline: reports frequent scuffing left foot/tripping Goal status: INITIAL  6. TBD  Baseline: TBD  Goal status: INITIAL   ASSESSMENT:  CLINICAL IMPRESSION: Instructed in ankle strength/kinesthetic awarness activities to improve left foot clearance for gait. Neuro re-ed activities for improving left ankle control and activities for reactive balance and stability for single limb stance.    OBJECTIVE IMPAIRMENTS: Abnormal gait, decreased activity tolerance, decreased balance, decreased endurance, difficulty walking, decreased strength, and pain.   ACTIVITY LIMITATIONS: carrying, lifting, stairs, and locomotion level  PARTICIPATION LIMITATIONS: cleaning, community activity, and occupation  PERSONAL FACTORS: Age, Time since onset of injury/illness/exacerbation, and 1-2 comorbidities: PMH are also affecting patient's functional outcome.   REHAB POTENTIAL: Excellent  CLINICAL DECISION MAKING: Evolving/moderate complexity  EVALUATION COMPLEXITY: Moderate  PLAN:  PT FREQUENCY: 1x/week  PT DURATION: 6 weeks  PLANNED INTERVENTIONS: 97750- Physical Performance Testing, 97110-Therapeutic exercises, 97530- Therapeutic activity, [id number]- Neuromuscular re-education, 97535- Self Care, 02859- Manual therapy, [id number]- Gait training, [phone]- Orthotic Initial, [id number]- Orthotic/Prosthetic subsequent, [phone]- Canalith repositioning,  [phone]- Aquatic Therapy, and Patient/Family education  PLAN FOR NEXT SESSION: foot-up for left and performing fast walking (extended trial?) reaction balance, and goal, HEP development for balance. Higher level balance challenges such as walking/carrying large items up/down steps/ladder, etc   9:38 AM, 09/10/24 M. Kelly Lynzee Lindquist, PT, DPT Physical Therapist- [place] Office Number: [phone]

## 2024-09-10 NOTE — Procedures (Signed)
 Physician Interpretation: Please see link under Procedure Tab or under Encounters tab for physician report, technical report, as well as O2 titration and/or PAP titration tables (if applicable).

## 2024-09-15 ENCOUNTER — Ambulatory Visit: Payer: Self-pay | Admitting: Neurology

## 2024-09-15 DIAGNOSIS — G4733 Obstructive sleep apnea (adult) (pediatric): Secondary | ICD-10-CM

## 2024-09-16 NOTE — Telephone Encounter (Signed)
 Pt has called RN back

## 2024-09-16 NOTE — Telephone Encounter (Signed)
 Zott, Glade Boring, Payton Prinsen L, RN; [place], [place]; [place], [place]; Darrel Boyer Golden, we received an order on 8/26 from [facility] to set him up on an auto pap, He has his setup appt 9/17

## 2024-09-16 NOTE — Telephone Encounter (Signed)
 Patient returned my call. We discussed his sleep study results as noted below by Dr Buck. Patient verbalized understanding and agreed to setup on autopap. We discussed the insurance compliance requirements which includes using the machine at least 4 hours at night and also being seen between 31 and 90 days after setup. Patient was informed to let us  know if he doesn't hear from [facility] within the week. He scheduled initial f/u for 12/23 at 3:45 pm, arrive at 330.   Urgent autopap referral sent to [facility]. Sleep study result sent to referring provider.

## 2024-09-16 NOTE — Telephone Encounter (Signed)
Called pt and LVM with office number asking for call back.  

## 2024-09-16 NOTE — Telephone Encounter (Signed)
 Pt called returning called ,  Informed Pt will get call back

## 2024-09-16 NOTE — Telephone Encounter (Signed)
 Called pt back and LVM asking for call back. Apologized for phone tag. Will be here till 5 pm.

## 2024-09-16 NOTE — Telephone Encounter (Signed)
-----   Message from True Mar sent at 09/15/2024 12:17 PM EDT ----- Urgent set up requested on PAP therapy, due to severe OSA.   Patient referred by Dr. Emeline, seen by me on 06/30/24, diagnostic PSG on 09/02/24.    Please call and notify the patient that the recent sleep study showed severe obstructive sleep apnea. I recommend treatment for in the form of autoPAP. We may consider at a CPAP titration study at a  later date, if need be, which means, that we would ask him to come back in for a second sleep study with CPAP treatment. For now, I would like to start him on a so-called autoPAP machine at home,  through a DME company (of his choice, or as per insurance requirement). The DME representative will educate him on how to use the machine, how to put the mask on, etc. I have placed an order in the  chart. Please send referral, talk to patient, send report to referring MD. We will need a FU in sleep clinic for 10 weeks post-PAP set up, please arrange that with me or one of our NPs. Thanks,   True Mar, MD, PhD [facility])    ----- Message ----- From: Mar True, MD Sent: 09/15/2024  12:15 PM EDT To: True Mar, MD

## 2024-09-17 ENCOUNTER — Telehealth: Payer: Self-pay | Admitting: Neurology

## 2024-09-17 NOTE — Telephone Encounter (Signed)
 Patient returned my call. We discussed. He apologized and said he didn't realize the sleep offices were separate. He said his memory has not been very good since his brain bleed several months ago. He did confirm he's had his cpap machine for a few weeks now. He also agreed to continue follow-up with [facility] for sleep apnea care and we will cancel everything on our end at [facility] including the Dec appt with the NP. He was appreciative for the call and also thanked us  for everything.   Message sent to [facility] for order cancellation. Appt for December w/ Harlene NP has been canceled.

## 2024-09-17 NOTE — Telephone Encounter (Signed)
 Tried to reach pt, no answer. Called wife and was able to relay to her that we are trying to reach pt about his cpap care. Informed her it appears he is getting duplicate care here and [facility] but he already started process of getting cpap machine through them so Dr Buck proposes he continue follow-up with [facility] to avoid duplicate treatment. Wife will ask patient to call us . I tried to call pt again and was able to LVM asking for call back asap.

## 2024-09-17 NOTE — Telephone Encounter (Signed)
 Please advise patient that since [facility] had done sleep testing and ordered his PAP machine, he can follow-up with them for his sleep apnea.  We do not need to duplicate treatment.

## 2024-09-19 ENCOUNTER — Ambulatory Visit

## 2024-09-19 DIAGNOSIS — R262 Difficulty in walking, not elsewhere classified: Secondary | ICD-10-CM

## 2024-09-19 DIAGNOSIS — R2689 Other abnormalities of gait and mobility: Secondary | ICD-10-CM

## 2024-09-19 DIAGNOSIS — M6281 Muscle weakness (generalized): Secondary | ICD-10-CM

## 2024-09-19 DIAGNOSIS — R2681 Unsteadiness on feet: Secondary | ICD-10-CM

## 2024-09-19 NOTE — Therapy (Signed)
 OUTPATIENT PHYSICAL THERAPY NEURO TREATMENT   Patient Name: Micheal Ayers MRN: [id number] DOB:1961/09/12, 63 y.o., male Today's Date: 09/19/2024   PCP: Verena Mems, MD REFERRING PROVIDER: Emeline Joesph BROCKS, DO  END OF SESSION:  PT End of Session - 09/19/24 0800     Visit Number 3    Number of Visits 7    Date for Recertification  10/20/24    Authorization Type [employer]    PT Start Time 0800    PT Stop Time 0845    PT Time Calculation (min) 45 min          Past Medical History:  Diagnosis Date   ADD (attention deficit disorder)    Allergic rhinitis, cause unspecified    Chronic kidney disease    Acute renal failure   Hemochromatosis, hereditary    seen at [facility]   Hypertension    Major depressive disorder    Stroke [facility])    Past Surgical History:  Procedure Laterality Date   CRANIOTOMY Left 02/24/2024   Procedure: CRANIOTOMY HEMATOMA EVACUATION SUBDURAL;  Surgeon: Joshua Alm Hamilton, MD;  Location: [facility] OR;  Service: Neurosurgery;  Laterality: Left;   elbow surgury  1969   left elbow   INGUINAL HERNIA REPAIR  2003   left   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/2023   Procedure: Shunt Placment - right occipital;  Surgeon: Louis Shove, MD;  Location: [facility] OR;  Service: Neurosurgery;  Laterality: Right;   Patient Active Problem List   Diagnosis Date Noted   Impairment of balance 08/19/2024   History of snoring 05/16/2024   Hyponatremia 03/17/2024   GERD (gastroesophageal reflux disease) 03/17/2024   Anxiety disorder with panic attacks 03/17/2024   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 03/11/2024   Subdural hematoma (HCC) 02/24/2024   S/P craniotomy 02/24/2024   Obstructive hydrocephalus (HCC) 03/13/2023   Ischemic cerebrovascular accident (CVA) (HCC) 02/19/2023   Cerebral ventriculomegaly 02/19/2023   Acute renal failure superimposed on stage 2 chronic kidney disease 02/19/2023   Hypertensive urgency 02/19/2023   Abnormal CXR  02/19/2023   CVA (cerebral vascular accident) (HCC) 02/19/2023   Fatigue 09/20/2012   Family history of hemochromatosis 09/20/2012   Preventative health care 09/06/2012   ADD (attention deficit disorder)    Allergic rhinitis     ONSET DATE: 02/2024  REFERRING DIAG: D93.5XAA (ICD-10-CM) - Subdural hematoma (HCC) R26.89 (ICD-10-CM) - Impairment of balance  THERAPY DIAG:  Muscle weakness (generalized)  Unsteadiness on feet  Difficulty in walking, not elsewhere classified  Other abnormalities of gait and mobility  Rationale for Evaluation and Treatment: Rehabilitation  SUBJECTIVE:  SUBJECTIVE STATEMENT: Doing ok no new issues Pt accompanied by: self  PERTINENT HISTORY: hx of SDH  PAIN:  Are you having pain? Lower back has been a challenge, notes stiffness in AM and left knee pain/crepitus with activity  PRECAUTIONS: None  RED FLAGS: None   WEIGHT BEARING RESTRICTIONS: No  FALLS: Has patient fallen in last 6 months? No  LIVING ENVIRONMENT: Lives with: lives with their family Lives in: House/apartment Stairs: stairs to enter home Has following equipment at home: None  PLOF: Independent  PATIENT GOALS: improve walking pattern and balance and fatigue.   OBJECTIVE:   TODAY'S TREATMENT: 09/19/24 Activity Comments  Gait training Foot up applied to left foot 2.2 mph x 3 min warm-up 30 sec intervals 3.0 mph//60 sec recovery pace 2.2 mph 6.5 min -hill intervals at 2.2 mph 1 min warm-up; 1 min at 8%; 1 min at 0 x 4.5 min  Coordination/balance/reaction -Fast turns for grabbing objects from mid air -throwing/catching green med ball -                 TODAY'S TREATMENT: 09/10/24 Activity Comments  Standing tib raises 3x10   Standing march w/ resistance 3x10 green  Sidestep w/  green loop x 2 min Around toes  Heel walking Limited left anlke eccentric control  Step-ups/backwards 2x10 15# unilat front rack hold/shoulder hold  Reactive balance     PATIENT EDUCATION: Education details: HEP initiation and rationale Education method: Explanation Education comprehension: verbalized understanding  HOME EXERCISE PROGRAM: Access Code: [email or web address] URL: [email or web address] Date: 09/10/2024 Prepared by: Kelly Darshawn Boateng  Exercises - Standing Bilateral Ankle Dorsiflexion AROM  - 2-3 x weekly - 3 sets - 10 reps - 2 sec hold - Marching with Resistance  - 1 x daily - 2-3 x weekly - 3 sets - 10 reps - Side Stepping with Resistance at Feet  - 1 x daily - 2-3 x weekly - 1-3 sets - 2 min hold  Note: Objective measures were completed at Evaluation unless otherwise noted.  DIAGNOSTIC FINDINGS:   COGNITION: Overall cognitive status: Within functional limits for tasks assessed   SENSATION: Not tested  COORDINATION: Decreased speed LLE/LUE to rapid alternating movements  EDEMA:  none  MUSCLE TONE: NT     POSTURE: No Significant postural limitations  LOWER EXTREMITY ROM:     WNL  LOWER EXTREMITY MMT:    MMT Right Eval Left Eval  Hip flexion    Hip extension    Hip abduction    Hip adduction    Hip internal rotation    Hip external rotation    Knee flexion 5 5  Knee extension 5 5  Ankle dorsiflexion 5 4  Ankle plantarflexion 4 3+  Ankle inversion    Ankle eversion    (Blank rows = not tested)  BED MOBILITY:  indep  TRANSFERS: indep   CURB:  Findings: indep  STAIRS: Independent but unsteady without HR GAIT: Findings: Comments: decreased left ankle DF/foot clearance with instances of circumduction  FUNCTIONAL TESTS:  5 times sit to stand: 14 sec Mini-BESTest: 22/28 Single leg stance: 2-3 sec LLE; 3-5 sec RLE M-CTSIB  Condition 1: Firm Surface, EO 30 Sec, Normal Sway  Condition 2: Firm Surface, EC 30 Sec, Normal Sway   Condition 3: Foam Surface, EO 30 Sec, Normal Sway  Condition 4: Foam Surface, EC 18 Sec, Mild and Moderate Sway  TREATMENT DATE: 09/01/24     GOALS: Goals reviewed with patient? Yes  SHORT TERM GOALS: Target date: same as LTG   LONG TERM GOALS: Target date: 10/20/2024    Patient will be independent in HEP to improve functional outcomes Baseline:  Goal status: INITIAL  2.  Improve Mini-BESTest score to 26/28 for improved balance and reduced fall risk Baseline: 22/28 Goal status: INITIAL  3.  Demo/report ability to lift/carry bulky/heavy items up/down stairs to improve safety at work/home Baseline: HR needed Goal status: INITIAL  4.  Improve single limb stance to 20 sec LLE/RLE to reduce risk for falls and improve single limb stance Baseline:  Goal status: INITIAL  5.  Demo/report improved left ankle dorsiflexion for foot clearance during gait Baseline: reports frequent scuffing left foot/tripping Goal status: INITIAL  6. TBD  Baseline: TBD  Goal status: INITIAL   ASSESSMENT:  CLINICAL IMPRESSION: Gait training w/ trial using foot-up device to improve foot clearnce w/ use of treadmill to sustain faster speed as well as incline, use of miror for self-monitoring to reduce LLE circumduction w/ improved mechanics appreciated. Coordination and reactive balance activities to facilitate righting reactions to improve mobility and reduce risk for falls. Provided w/ info for foot-up device for DF assist  OBJECTIVE IMPAIRMENTS: Abnormal gait, decreased activity tolerance, decreased balance, decreased endurance, difficulty walking, decreased strength, and pain.   ACTIVITY LIMITATIONS: carrying, lifting, stairs, and locomotion level  PARTICIPATION LIMITATIONS: cleaning, community activity, and occupation  PERSONAL FACTORS: Age, Time since onset  of injury/illness/exacerbation, and 1-2 comorbidities: PMH are also affecting patient's functional outcome.   REHAB POTENTIAL: Excellent  CLINICAL DECISION MAKING: Evolving/moderate complexity  EVALUATION COMPLEXITY: Moderate  PLAN:  PT FREQUENCY: 1x/week  PT DURATION: 6 weeks  PLANNED INTERVENTIONS: 97750- Physical Performance Testing, 97110-Therapeutic exercises, 97530- Therapeutic activity, [id number]- Neuromuscular re-education, 97535- Self Care, [id number]- Manual therapy, [id number]- Gait training, [phone]- Orthotic Initial, [id number]- Orthotic/Prosthetic subsequent, [phone]- Canalith repositioning, [phone]- Aquatic Therapy, and Patient/Family education  PLAN FOR NEXT SESSION: freaction balance, and goal, HEP development for balance. Higher level balance challenges such as walking/carrying large items up/down steps/ladder, etc   8:01 AM, 09/19/24 M. Kelly Gabrien Mentink, PT, DPT Physical Therapist- [place] Office Number: [phone]

## 2024-09-26 ENCOUNTER — Ambulatory Visit

## 2024-09-26 DIAGNOSIS — M6281 Muscle weakness (generalized): Secondary | ICD-10-CM

## 2024-09-26 DIAGNOSIS — R2689 Other abnormalities of gait and mobility: Secondary | ICD-10-CM

## 2024-09-26 DIAGNOSIS — R262 Difficulty in walking, not elsewhere classified: Secondary | ICD-10-CM

## 2024-09-26 DIAGNOSIS — R2681 Unsteadiness on feet: Secondary | ICD-10-CM

## 2024-09-26 NOTE — Therapy (Signed)
 OUTPATIENT PHYSICAL THERAPY NEURO TREATMENT   Patient Name: Micheal Ayers MRN: [id number] DOB:1961/11/06, 63 y.o., male Today's Date: 09/26/2024   PCP: Verena Mems, MD REFERRING PROVIDER: Emeline Joesph BROCKS, DO  END OF SESSION:  PT End of Session - 09/26/24 0802     Visit Number 4    Number of Visits 7    Date for Recertification  10/20/24    Authorization Type Aetna    PT Start Time 0802    PT Stop Time 0845    PT Time Calculation (min) 43 min          Past Medical History:  Diagnosis Date   ADD (attention deficit disorder)    Allergic rhinitis, cause unspecified    Chronic kidney disease    Acute renal failure   Hemochromatosis, hereditary    seen at [facility]   Hypertension    Major depressive disorder    Stroke [facility])    Past Surgical History:  Procedure Laterality Date   CRANIOTOMY Left 02/24/2024   Procedure: CRANIOTOMY HEMATOMA EVACUATION SUBDURAL;  Surgeon: Joshua Alm Hamilton, MD;  Location: [facility] OR;  Service: Neurosurgery;  Laterality: Left;   elbow surgury  1969   left elbow   INGUINAL HERNIA REPAIR  2003   left   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/2023   Procedure: Shunt Placment - right occipital;  Surgeon: Louis Shove, MD;  Location: [facility] OR;  Service: Neurosurgery;  Laterality: Right;   Patient Active Problem List   Diagnosis Date Noted   Impairment of balance 08/19/2024   History of snoring 05/16/2024   Hyponatremia 03/17/2024   GERD (gastroesophageal reflux disease) 03/17/2024   Anxiety disorder with panic attacks 03/17/2024   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 03/11/2024   Subdural hematoma (HCC) 02/24/2024   S/P craniotomy 02/24/2024   Obstructive hydrocephalus (HCC) 03/13/2023   Ischemic cerebrovascular accident (CVA) (HCC) 02/19/2023   Cerebral ventriculomegaly 02/19/2023   Acute renal failure superimposed on stage 2 chronic kidney disease 02/19/2023   Hypertensive urgency 02/19/2023   Abnormal CXR  02/19/2023   CVA (cerebral vascular accident) (HCC) 02/19/2023   Fatigue 09/20/2012   Family history of hemochromatosis 09/20/2012   Preventative health care 09/06/2012   ADD (attention deficit disorder)    Allergic rhinitis     ONSET DATE: 02/2024  REFERRING DIAG: D93.5XAA (ICD-10-CM) - Subdural hematoma (HCC) R26.89 (ICD-10-CM) - Impairment of balance  THERAPY DIAG:  Muscle weakness (generalized)  Unsteadiness on feet  Difficulty in walking, not elsewhere classified  Other abnormalities of gait and mobility  Rationale for Evaluation and Treatment: Rehabilitation  SUBJECTIVE:  SUBJECTIVE STATEMENT: Right knee posterior pain and not sure from what Pt accompanied by: self  PERTINENT HISTORY: hx of SDH  PAIN:  Are you having pain? Right knee 4-5/10, posterior pain  PRECAUTIONS: None  RED FLAGS: None   WEIGHT BEARING RESTRICTIONS: No  FALLS: Has patient fallen in last 6 months? No  LIVING ENVIRONMENT: Lives with: lives with their family Lives in: House/apartment Stairs: stairs to enter home Has following equipment at home: None  PLOF: Independent  PATIENT GOALS: improve walking pattern and balance and fatigue.   OBJECTIVE:   TODAY'S TREATMENT: 09/26/24 Activity Comments  Knee pain assessment -No tenderness to palpation -Slight discomfort to hamstring resistance -full ROM  Walking on compliant surfaces With and without obstacles  Single leg stance w/ bosu support EO/EC 3x30 sec  Standing on foam Stepping stone taps/hold  Rocker board   Resisted walking      TODAY'S TREATMENT: 09/19/24 Activity Comments  Gait training Foot up applied to left foot 2.2 mph x 3 min warm-up 30 sec intervals 3.0 mph//60 sec recovery pace 2.2 mph 6.5 min -hill intervals at 2.2 mph 1 min  warm-up; 1 min at 8%; 1 min at 0 x 4.5 min  Coordination/balance/reaction -Fast turns for grabbing objects from mid air -throwing/catching green med ball -                 TODAY'S TREATMENT: 09/10/24 Activity Comments  Standing tib raises 3x10   Standing march w/ resistance 3x10 green  Sidestep w/ green loop x 2 min Around toes  Heel walking Limited left anlke eccentric control  Step-ups/backwards 2x10 15# unilat front rack hold/shoulder hold  Reactive balance     PATIENT EDUCATION: Education details: HEP initiation and rationale Education method: Explanation Education comprehension: verbalized understanding  HOME EXERCISE PROGRAM: Access Code: [email or web address] URL: [email or web address] Date: 09/10/2024 Prepared by: Kelly Whitni Pasquini  Exercises - Standing Bilateral Ankle Dorsiflexion AROM  - 2-3 x weekly - 3 sets - 10 reps - 2 sec hold - Marching with Resistance  - 1 x daily - 2-3 x weekly - 3 sets - 10 reps - Side Stepping with Resistance at Feet  - 1 x daily - 2-3 x weekly - 1-3 sets - 2 min hold  Note: Objective measures were completed at Evaluation unless otherwise noted.  DIAGNOSTIC FINDINGS:   COGNITION: Overall cognitive status: Within functional limits for tasks assessed   SENSATION: Not tested  COORDINATION: Decreased speed LLE/LUE to rapid alternating movements  EDEMA:  none  MUSCLE TONE: NT     POSTURE: No Significant postural limitations  LOWER EXTREMITY ROM:     WNL  LOWER EXTREMITY MMT:    MMT Right Eval Left Eval  Hip flexion    Hip extension    Hip abduction    Hip adduction    Hip internal rotation    Hip external rotation    Knee flexion 5 5  Knee extension 5 5  Ankle dorsiflexion 5 4  Ankle plantarflexion 4 3+  Ankle inversion    Ankle eversion    (Blank rows = not tested)  BED MOBILITY:  indep  TRANSFERS: indep   CURB:  Findings: indep  STAIRS: Independent but unsteady without HR GAIT: Findings:  Comments: decreased left ankle DF/foot clearance with instances of circumduction  FUNCTIONAL TESTS:  5 times sit to stand: 14 sec Mini-BESTest: 22/28 Single leg stance: 2-3 sec LLE; 3-5 sec RLE M-CTSIB  Condition 1: Firm Surface, EO 30 Sec, Normal Sway  Condition  2: Firm Surface, EC 30 Sec, Normal Sway  Condition 3: Foam Surface, EO 30 Sec, Normal Sway  Condition 4: Foam Surface, EC 18 Sec, Mild and Moderate Sway                                                                                                                                  TREATMENT DATE: 09/01/24     GOALS: Goals reviewed with patient? Yes  SHORT TERM GOALS: Target date: same as LTG   LONG TERM GOALS: Target date: 10/20/2024    Patient will be independent in HEP to improve functional outcomes Baseline:  Goal status: INITIAL  2.  Improve Mini-BESTest score to 26/28 for improved balance and reduced fall risk Baseline: 22/28 Goal status: INITIAL  3.  Demo/report ability to lift/carry bulky/heavy items up/down stairs to improve safety at work/home Baseline: HR needed Goal status: INITIAL  4.  Improve single limb stance to 20 sec LLE/RLE to reduce risk for falls and improve single limb stance Baseline:  Goal status: INITIAL  5.  Demo/report improved left ankle dorsiflexion for foot clearance during gait Baseline: reports frequent scuffing left foot/tripping Goal status: INITIAL  6. TBD  Baseline: TBD  Goal status: INITIAL   ASSESSMENT:  CLINICAL IMPRESSION: Focus on static balance/postural control to improve proprioceptive awareness and more static today due to onset of right knee pain/discomfort. Difficulty with mulitsensory balance conditions with tendency for left lateral LOB. Continued sessions to progress POC details to improve mobility, balance, and endurance  OBJECTIVE IMPAIRMENTS: Abnormal gait, decreased activity tolerance, decreased balance, decreased endurance, difficulty walking,  decreased strength, and pain.   ACTIVITY LIMITATIONS: carrying, lifting, stairs, and locomotion level  PARTICIPATION LIMITATIONS: cleaning, community activity, and occupation  PERSONAL FACTORS: Age, Time since onset of injury/illness/exacerbation, and 1-2 comorbidities: PMH are also affecting patient's functional outcome.   REHAB POTENTIAL: Excellent  CLINICAL DECISION MAKING: Evolving/moderate complexity  EVALUATION COMPLEXITY: Moderate  PLAN:  PT FREQUENCY: 1x/week  PT DURATION: 6 weeks  PLANNED INTERVENTIONS: 97750- Physical Performance Testing, 97110-Therapeutic exercises, 97530- Therapeutic activity, [id number]- Neuromuscular re-education, 97535- Self Care, 02859- Manual therapy, [id number]- Gait training, [phone]- Orthotic Initial, [id number]- Orthotic/Prosthetic subsequent, [phone]- Canalith repositioning, [id number]- Aquatic Therapy, and Patient/Family education  PLAN FOR NEXT SESSION: freaction balance, and goal, HEP development for balance. Higher level balance challenges such as walking/carrying large items up/down steps/ladder, etc   8:02 AM, 09/26/24 M. Kelly Briyan Kleven, PT, DPT Physical Therapist- [place] Office Number: [phone]

## 2024-10-03 ENCOUNTER — Ambulatory Visit: Attending: Physical Medicine and Rehabilitation

## 2024-10-03 DIAGNOSIS — M6281 Muscle weakness (generalized): Secondary | ICD-10-CM | POA: Diagnosis present

## 2024-10-03 DIAGNOSIS — R262 Difficulty in walking, not elsewhere classified: Secondary | ICD-10-CM | POA: Insufficient documentation

## 2024-10-03 DIAGNOSIS — R2681 Unsteadiness on feet: Secondary | ICD-10-CM | POA: Diagnosis present

## 2024-10-03 DIAGNOSIS — R2689 Other abnormalities of gait and mobility: Secondary | ICD-10-CM | POA: Insufficient documentation

## 2024-10-03 NOTE — Therapy (Signed)
 OUTPATIENT PHYSICAL THERAPY NEURO TREATMENT   Patient Name: Micheal Ayers MRN: [id number] DOB:February 09, 1961, 63 y.o., male Today's Date: 10/03/2024   PCP: Verena Mems, MD REFERRING PROVIDER: Emeline Joesph BROCKS, DO  END OF SESSION:  PT End of Session - 10/03/24 0801     Visit Number 5    Number of Visits 7    Date for Recertification  10/20/24    Authorization Type [employer]    PT Start Time 0800    PT Stop Time 0845    PT Time Calculation (min) 45 min          Past Medical History:  Diagnosis Date   ADD (attention deficit disorder)    Allergic rhinitis, cause unspecified    Chronic kidney disease    Acute renal failure   Hemochromatosis, hereditary    seen at [facility]   Hypertension    Major depressive disorder    Stroke [facility])    Past Surgical History:  Procedure Laterality Date   CRANIOTOMY Left 02/24/2024   Procedure: CRANIOTOMY HEMATOMA EVACUATION SUBDURAL;  Surgeon: Joshua Alm Hamilton, MD;  Location: [facility] OR;  Service: Neurosurgery;  Laterality: Left;   elbow surgury  1969   left elbow   INGUINAL HERNIA REPAIR  2003   left   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/2023   Procedure: Shunt Placment - right occipital;  Surgeon: Louis Shove, MD;  Location[facility] OR;  Service: Neurosurgery;  Laterality: Right;   Patient Active Problem List   Diagnosis Date Noted   Impairment of balance 08/19/2024   History of snoring 05/16/2024   Hyponatremia 03/17/2024   GERD (gastroesophageal reflux disease) 03/17/2024   Anxiety disorder with panic attacks 03/17/2024   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 03/11/2024   Subdural hematoma (HCC) 02/24/2024   S/P craniotomy 02/24/2024   Obstructive hydrocephalus (HCC) 03/13/2023   Ischemic cerebrovascular accident (CVA) (HCC) 02/19/2023   Cerebral ventriculomegaly 02/19/2023   Acute renal failure superimposed on stage 2 chronic kidney disease 02/19/2023   Hypertensive urgency 02/19/2023   Abnormal CXR  02/19/2023   CVA (cerebral vascular accident) (HCC) 02/19/2023   Fatigue 09/20/2012   Family history of hemochromatosis 09/20/2012   Preventative health care 09/06/2012   ADD (attention deficit disorder)    Allergic rhinitis     ONSET DATE: 02/2024  REFERRING DIAG: D93.5XAA (ICD-10-CM) - Subdural hematoma (HCC) R26.89 (ICD-10-CM) - Impairment of balance  THERAPY DIAG:  Muscle weakness (generalized)  Unsteadiness on feet  Difficulty in walking, not elsewhere classified  Other abnormalities of gait and mobility  Rationale for Evaluation and Treatment: Rehabilitation  SUBJECTIVE:  SUBJECTIVE STATEMENT: Got a sonogram on the right knee and found to have a torn meniscus Pt accompanied by: self  PERTINENT HISTORY: hx of SDH  PAIN:  Are you having pain? Right knee 2-3/10, posterior pain  PRECAUTIONS: None  RED FLAGS: None   WEIGHT BEARING RESTRICTIONS: No  FALLS: Has patient fallen in last 6 months? No  LIVING ENVIRONMENT: Lives with: lives with their family Lives in: House/apartment Stairs: stairs to enter home Has following equipment at home: None  PLOF: Independent  PATIENT GOALS: improve walking pattern and balance and fatigue.   OBJECTIVE:   TODAY'S TREATMENT: 10/03/24 Activity Comments  QS 30x   SLR 3x10 Supine and sidelying for hip abd  Mini-squats 3x10   Pt education Conservative mgmt strategies for torn mensicus. And education/handout for brain injury survivors locally  Resisted walking 2x2 min   TKE 3x10 20#        TODAY'S TREATMENT: 09/26/24 Activity Comments  Knee pain assessment -No tenderness to palpation -Slight discomfort to hamstring resistance -full ROM  Walking on compliant surfaces With and without obstacles  Single leg stance w/ bosu support EO/EC  3x30 sec  Standing on foam Stepping stone taps/hold  Rocker board   Resisted walking            PATIENT EDUCATION: Education details: HEP initiation and rationale Education method: Explanation Education comprehension: verbalized understanding  HOME EXERCISE PROGRAM: Access Code: [email or web address] URL: [email or web address] Date: 09/10/2024 Prepared by: Kelly Kierstyn Baranowski  Exercises - Standing Bilateral Ankle Dorsiflexion AROM  - 2-3 x weekly - 3 sets - 10 reps - 2 sec hold - Marching with Resistance  - 1 x daily - 2-3 x weekly - 3 sets - 10 reps - Side Stepping with Resistance at Feet  - 1 x daily - 2-3 x weekly - 1-3 sets - 2 min hold - Supine Quad Set  - 1 x daily - 7 x weekly - 3 sets - 10 reps - Active Straight Leg Raise with Quad Set  - 1 x daily - 7 x weekly - 3 sets - 10 reps - Sidelying Hip Abduction  - 1 x daily - 7 x weekly - 3 sets - 10 reps - Mini Squat with Counter Support  - 1 x daily - 7 x weekly - 3 sets - 10 reps  Note: Objective measures were completed at Evaluation unless otherwise noted.  DIAGNOSTIC FINDINGS:   COGNITION: Overall cognitive status: Within functional limits for tasks assessed   SENSATION: Not tested  COORDINATION: Decreased speed LLE/LUE to rapid alternating movements  EDEMA:  none  MUSCLE TONE: NT     POSTURE: No Significant postural limitations  LOWER EXTREMITY ROM:     WNL  LOWER EXTREMITY MMT:    MMT Right Eval Left Eval  Hip flexion    Hip extension    Hip abduction    Hip adduction    Hip internal rotation    Hip external rotation    Knee flexion 5 5  Knee extension 5 5  Ankle dorsiflexion 5 4  Ankle plantarflexion 4 3+  Ankle inversion    Ankle eversion    (Blank rows = not tested)  BED MOBILITY:  indep  TRANSFERS: indep   CURB:  Findings: indep  STAIRS: Independent but unsteady without HR GAIT: Findings: Comments: decreased left ankle DF/foot clearance with instances of  circumduction  FUNCTIONAL TESTS:  5 times sit to stand: 14 sec Mini-BESTest: 22/28 Single leg stance: 2-3 sec LLE; 3-5 sec  RLE M-CTSIB  Condition 1: Firm Surface, EO 30 Sec, Normal Sway  Condition 2: Firm Surface, EC 30 Sec, Normal Sway  Condition 3: Foam Surface, EO 30 Sec, Normal Sway  Condition 4: Foam Surface, EC 18 Sec, Mild and Moderate Sway                                                                                                                                  TREATMENT DATE: 09/01/24     GOALS: Goals reviewed with patient? Yes  SHORT TERM GOALS: Target date: same as LTG   LONG TERM GOALS: Target date: 10/20/2024    Patient will be independent in HEP to improve functional outcomes Baseline:  Goal status: INITIAL  2.  Improve Mini-BESTest score to 26/28 for improved balance and reduced fall risk Baseline: 22/28 Goal status: INITIAL  3.  Demo/report ability to lift/carry bulky/heavy items up/down stairs to improve safety at work/home Baseline: HR needed Goal status: INITIAL  4.  Improve single limb stance to 20 sec LLE/RLE to reduce risk for falls and improve single limb stance Baseline:  Goal status: INITIAL  5.  Demo/report improved left ankle dorsiflexion for foot clearance during gait Baseline: reports frequent scuffing left foot/tripping Goal status: INITIAL  6. TBD  Baseline: TBD  Goal status: INITIAL   ASSESSMENT:  CLINICAL IMPRESSION: Reports ongoing posterior right knee pain with confirmed meniscus tear.  Instructed in HEP to address knee deficits and education regarding conservative mgmt of meniscus tears. Activities with emphasis on hip and quad strength. Good ROM without much restriction.   OBJECTIVE IMPAIRMENTS: Abnormal gait, decreased activity tolerance, decreased balance, decreased endurance, difficulty walking, decreased strength, and pain.   ACTIVITY LIMITATIONS: carrying, lifting, stairs, and locomotion  level  PARTICIPATION LIMITATIONS: cleaning, community activity, and occupation  PERSONAL FACTORS: Age, Time since onset of injury/illness/exacerbation, and 1-2 comorbidities: PMH are also affecting patient's functional outcome.   REHAB POTENTIAL: Excellent  CLINICAL DECISION MAKING: Evolving/moderate complexity  EVALUATION COMPLEXITY: Moderate  PLAN:  PT FREQUENCY: 1x/week  PT DURATION: 6 weeks  PLANNED INTERVENTIONS: 97750- Physical Performance Testing, 97110-Therapeutic exercises, 97530- Therapeutic activity, [id number]- Neuromuscular re-education, 97535- Self Care, [id number]- Manual therapy, [id number]- Gait training, [phone]- Orthotic Initial, [id number]- Orthotic/Prosthetic subsequent, [phone]- Canalith repositioning, [phone]- Aquatic Therapy, and Patient/Family education  PLAN FOR NEXT SESSION: freaction balance, and goal, HEP development for balance. Higher level balance challenges such as walking/carrying large items up/down steps/ladder, etc   8:02 AM, 10/03/24 M. Kelly Riese Hellard, PT, DPT Physical Therapist- [place] Office Number: [phone]

## 2024-10-10 ENCOUNTER — Ambulatory Visit

## 2024-10-10 DIAGNOSIS — R2681 Unsteadiness on feet: Secondary | ICD-10-CM

## 2024-10-10 DIAGNOSIS — R2689 Other abnormalities of gait and mobility: Secondary | ICD-10-CM

## 2024-10-10 DIAGNOSIS — R262 Difficulty in walking, not elsewhere classified: Secondary | ICD-10-CM

## 2024-10-10 DIAGNOSIS — M6281 Muscle weakness (generalized): Secondary | ICD-10-CM | POA: Diagnosis not present

## 2024-10-10 NOTE — Therapy (Signed)
 OUTPATIENT PHYSICAL THERAPY NEURO TREATMENT   Patient Name: FENRIS CAUBLE MRN: [id number] DOB:10-24-1961, 63 y.o., male Today's Date: 10/10/2024   PCP: Verena Mems, MD REFERRING PROVIDER: Emeline Joesph BROCKS, DO  END OF SESSION:  PT End of Session - 10/10/24 0801     Visit Number 6    Number of Visits 7    Date for Recertification  10/20/24    Authorization Type Aetna    PT Start Time 0800    PT Stop Time 0845    PT Time Calculation (min) 45 min          Past Medical History:  Diagnosis Date   ADD (attention deficit disorder)    Allergic rhinitis, cause unspecified    Chronic kidney disease    Acute renal failure   Hemochromatosis, hereditary    seen at [facility]   Hypertension    Major depressive disorder    Stroke [facility])    Past Surgical History:  Procedure Laterality Date   CRANIOTOMY Left 02/24/2024   Procedure: CRANIOTOMY HEMATOMA EVACUATION SUBDURAL;  Surgeon: Joshua Alm Hamilton, MD;  Location: [facility] OR;  Service: Neurosurgery;  Laterality: Left;   elbow surgury  1969   left elbow   INGUINAL HERNIA REPAIR  2003   left   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/2023   Procedure: Shunt Placment - right occipital;  Surgeon: Louis Shove, MD;  Location: [facility] OR;  Service: Neurosurgery;  Laterality: Right;   Patient Active Problem List   Diagnosis Date Noted   Impairment of balance 08/19/2024   History of snoring 05/16/2024   Hyponatremia 03/17/2024   GERD (gastroesophageal reflux disease) 03/17/2024   Anxiety disorder with panic attacks 03/17/2024   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 03/11/2024   Subdural hematoma (HCC) 02/24/2024   S/P craniotomy 02/24/2024   Obstructive hydrocephalus (HCC) 03/13/2023   Ischemic cerebrovascular accident (CVA) (HCC) 02/19/2023   Cerebral ventriculomegaly 02/19/2023   Acute renal failure superimposed on stage 2 chronic kidney disease 02/19/2023   Hypertensive urgency 02/19/2023   Abnormal CXR  02/19/2023   CVA (cerebral vascular accident) (HCC) 02/19/2023   Fatigue 09/20/2012   Family history of hemochromatosis 09/20/2012   Preventative health care 09/06/2012   ADD (attention deficit disorder)    Allergic rhinitis     ONSET DATE: 02/2024  REFERRING DIAG: D93.5XAA (ICD-10-CM) - Subdural hematoma (HCC) R26.89 (ICD-10-CM) - Impairment of balance  THERAPY DIAG:  Muscle weakness (generalized)  Unsteadiness on feet  Difficulty in walking, not elsewhere classified  Other abnormalities of gait and mobility  Rationale for Evaluation and Treatment: Rehabilitation  SUBJECTIVE:  SUBJECTIVE STATEMENT: Knee is not too bad, hasn't affected walking to badly Pt accompanied by: self  PERTINENT HISTORY: hx of SDH  PAIN:  Are you having pain? Right knee 2-3/10, posterior pain  PRECAUTIONS: None  RED FLAGS: None   WEIGHT BEARING RESTRICTIONS: No  FALLS: Has patient fallen in last 6 months? No  LIVING ENVIRONMENT: Lives with: lives with their family Lives in: House/apartment Stairs: stairs to enter home Has following equipment at home: None  PLOF: Independent  PATIENT GOALS: improve walking pattern and balance and fatigue.   OBJECTIVE:   TODAY'S TREATMENT: 10/10/24 Activity Comments  HEP review: -QS -SLR (supine/sidelying abd) -mini-squats   High step hurdles x 60 sec  Forwards/lateral  Coordination drills   Static multisensory balance            TODAY'S TREATMENT: 10/03/24 Activity Comments  QS 30x   SLR 3x10 Supine and sidelying for hip abd  Mini-squats 3x10   Pt education Conservative mgmt strategies for torn mensicus. And education/handout for brain injury survivors locally  Resisted walking 2x2 min   TKE 3x10 20#                 PATIENT  EDUCATION: Education details: HEP initiation and rationale Education method: Explanation Education comprehension: verbalized understanding  HOME EXERCISE PROGRAM: Access Code: [email or web address] URL: [email or web address] Date: 09/10/2024 Prepared by: Kelly Kary Colaizzi  Exercises - Standing Bilateral Ankle Dorsiflexion AROM  - 2-3 x weekly - 3 sets - 10 reps - 2 sec hold - Marching with Resistance  - 1 x daily - 2-3 x weekly - 3 sets - 10 reps - Side Stepping with Resistance at Feet  - 1 x daily - 2-3 x weekly - 1-3 sets - 2 min hold - Supine Quad Set  - 1 x daily - 7 x weekly - 3 sets - 10 reps - Active Straight Leg Raise with Quad Set  - 1 x daily - 7 x weekly - 3 sets - 10 reps - Sidelying Hip Abduction  - 1 x daily - 7 x weekly - 3 sets - 10 reps - Mini Squat with Counter Support  - 1 x daily - 7 x weekly - 3 sets - 10 reps  Note: Objective measures were completed at Evaluation unless otherwise noted.  DIAGNOSTIC FINDINGS:   COGNITION: Overall cognitive status: Within functional limits for tasks assessed   SENSATION: Not tested  COORDINATION: Decreased speed LLE/LUE to rapid alternating movements  EDEMA:  none  MUSCLE TONE: NT     POSTURE: No Significant postural limitations  LOWER EXTREMITY ROM:     WNL  LOWER EXTREMITY MMT:    MMT Right Eval Left Eval  Hip flexion    Hip extension    Hip abduction    Hip adduction    Hip internal rotation    Hip external rotation    Knee flexion 5 5  Knee extension 5 5  Ankle dorsiflexion 5 4  Ankle plantarflexion 4 3+  Ankle inversion    Ankle eversion    (Blank rows = not tested)  BED MOBILITY:  indep  TRANSFERS: indep   CURB:  Findings: indep  STAIRS: Independent but unsteady without HR GAIT: Findings: Comments: decreased left ankle DF/foot clearance with instances of circumduction  FUNCTIONAL TESTS:  5 times sit to stand: 14 sec Mini-BESTest: 22/28 Single leg stance: 2-3 sec LLE; 3-5 sec  RLE M-CTSIB  Condition 1: Firm Surface, EO 30 Sec, Normal Sway  Condition 2: Firm Surface,  EC 30 Sec, Normal Sway  Condition 3: Foam Surface, EO 30 Sec, Normal Sway  Condition 4: Foam Surface, EC 18 Sec, Mild and Moderate Sway                                                                                                                                  TREATMENT DATE: 09/01/24     GOALS: Goals reviewed with patient? Yes  SHORT TERM GOALS: Target date: same as LTG   LONG TERM GOALS: Target date: 10/20/2024    Patient will be independent in HEP to improve functional outcomes Baseline:  Goal status: INITIAL  2.  Improve Mini-BESTest score to 26/28 for improved balance and reduced fall risk Baseline: 22/28 Goal status: INITIAL  3.  Demo/report ability to lift/carry bulky/heavy items up/down stairs to improve safety at work/home Baseline: HR needed Goal status: INITIAL  4.  Improve single limb stance to 20 sec LLE/RLE to reduce risk for falls and improve single limb stance Baseline:  Goal status: INITIAL  5.  Demo/report improved left ankle dorsiflexion for foot clearance during gait Baseline: reports frequent scuffing left foot/tripping Goal status: INITIAL  6. TBD  Baseline: TBD  Goal status: INITIAL   ASSESSMENT:  CLINICAL IMPRESSION: Poor recall to HEP items. Continued with dynamic and static multisensory balance activities incorporating multi and dual-tasking to improve balance to divided attention for work tasks.  Notes minimal dysfunction from knee at this time but has been limiting his climbing ladders/stairs.  Doing well and prepared for D/C to HEP  OBJECTIVE IMPAIRMENTS: Abnormal gait, decreased activity tolerance, decreased balance, decreased endurance, difficulty walking, decreased strength, and pain.   ACTIVITY LIMITATIONS: carrying, lifting, stairs, and locomotion level  PARTICIPATION LIMITATIONS: cleaning, community activity, and  occupation  PERSONAL FACTORS: Age, Time since onset of injury/illness/exacerbation, and 1-2 comorbidities: PMH are also affecting patient's functional outcome.   REHAB POTENTIAL: Excellent  CLINICAL DECISION MAKING: Evolving/moderate complexity  EVALUATION COMPLEXITY: Moderate  PLAN:  PT FREQUENCY: 1x/week  PT DURATION: 6 weeks  PLANNED INTERVENTIONS: 97750- Physical Performance Testing, 97110-Therapeutic exercises, 97530- Therapeutic activity, [id number]- Neuromuscular re-education, 97535- Self Care, 02859- Manual therapy, [id number]- Gait training, [phone]- Orthotic Initial, [phone]- Orthotic/Prosthetic subsequent, [phone]- Canalith repositioning, [phone]- Aquatic Therapy, and Patient/Family education  PLAN FOR NEXT SESSION:goals performance/review   8:02 AM, 10/10/24 M. Kelly Rashawn Rolon, PT, DPT Physical Therapist- [place] Office Number: [phone]

## 2024-10-17 ENCOUNTER — Ambulatory Visit

## 2024-10-17 DIAGNOSIS — R2681 Unsteadiness on feet: Secondary | ICD-10-CM

## 2024-10-17 DIAGNOSIS — R2689 Other abnormalities of gait and mobility: Secondary | ICD-10-CM

## 2024-10-17 DIAGNOSIS — R262 Difficulty in walking, not elsewhere classified: Secondary | ICD-10-CM

## 2024-10-17 DIAGNOSIS — M6281 Muscle weakness (generalized): Secondary | ICD-10-CM

## 2024-10-17 NOTE — Therapy (Signed)
 OUTPATIENT PHYSICAL THERAPY NEURO TREATMENT and D/C summary   Patient Name: Micheal Ayers MRN: [id number] DOB:March 05, 1961, 63 y.o., male Today's Date: 10/17/2024   PCP: Verena Mems, MD REFERRING PROVIDER: Emeline Joesph BROCKS, DO  PHYSICAL THERAPY DISCHARGE SUMMARY  Visits from Start of Care: 7  Current functional level related to goals / functional outcomes: Goals met and improved status   Remaining deficits: Slight gait deviations left ankle--circumduction   Education / Equipment: HEP   Patient agrees to discharge. Patient goals were met. Patient is being discharged due to meeting the stated rehab goals.   END OF SESSION:  PT End of Session - 10/17/24 0800     Visit Number 7    Number of Visits 7    Date for Recertification  10/20/24    Authorization Type [employer]    PT Start Time 0800    PT Stop Time 0845    PT Time Calculation (min) 45 min          Past Medical History:  Diagnosis Date   ADD (attention deficit disorder)    Allergic rhinitis, cause unspecified    Chronic kidney disease    Acute renal failure   Hemochromatosis, hereditary    seen at [facility]   Hypertension    Major depressive disorder    Stroke [facility])    Past Surgical History:  Procedure Laterality Date   CRANIOTOMY Left 02/24/2024   Procedure: CRANIOTOMY HEMATOMA EVACUATION SUBDURAL;  Surgeon: Joshua Alm Hamilton, MD;  Location: [facility] OR;  Service: Neurosurgery;  Laterality: Left;   elbow surgury  1969   left elbow   INGUINAL HERNIA REPAIR  2003   left   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/2023   Procedure: Shunt Placment - right occipital;  Surgeon: Louis Shove, MD;  Location: [facility] OR;  Service: Neurosurgery;  Laterality: Right;   Patient Active Problem List   Diagnosis Date Noted   Impairment of balance 08/19/2024   History of snoring 05/16/2024   Hyponatremia 03/17/2024   GERD (gastroesophageal reflux disease) 03/17/2024   Anxiety disorder with panic attacks 03/17/2024   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 03/11/2024   Subdural hematoma (HCC) 02/24/2024   S/P craniotomy 02/24/2024   Obstructive hydrocephalus (HCC) 03/13/2023   Ischemic cerebrovascular accident (CVA) (HCC) 02/19/2023   Cerebral ventriculomegaly 02/19/2023   Acute renal failure superimposed on stage 2 chronic kidney disease 02/19/2023   Hypertensive urgency 02/19/2023   Abnormal CXR 02/19/2023   CVA (cerebral vascular accident) (HCC) 02/19/2023   Fatigue 09/20/2012   Family history of hemochromatosis 09/20/2012   Preventative health care 09/06/2012   ADD (attention deficit disorder)    Allergic rhinitis     ONSET DATE: 02/2024  REFERRING DIAG: D93.5XAA (ICD-10-CM) - Subdural hematoma (HCC) R26.89 (ICD-10-CM) - Impairment of balance  THERAPY DIAG:  Muscle weakness (generalized)  Unsteadiness on feet  Difficulty in walking, not elsewhere classified  Other abnormalities of gait and mobility  Rationale for Evaluation and Treatment: Rehabilitation  SUBJECTIVE:  SUBJECTIVE STATEMENT: Knee is not too bad, hasn't affected walking to badly Pt accompanied by: self  PERTINENT HISTORY: hx of SDH  PAIN:  Are you having pain? Right knee 2/10, anterior pain  PRECAUTIONS: None  RED FLAGS: None   WEIGHT BEARING RESTRICTIONS: No  FALLS: Has patient fallen in last 6 months? No  LIVING ENVIRONMENT: Lives with: lives with their family Lives in: House/apartment Stairs: stairs to enter home Has following equipment at home: None  PLOF: Independent  PATIENT GOALS: improve walking pattern and balance and fatigue.   OBJECTIVE:   TODAY'S TREATMENT: 10/17/24 Activity Comments  POC performance/review   Pt education regarding community resources for support groups/exercise   HEP  review   Multisensory static balance to facilitate postural awareness            TODAY'S TREATMENT: 10/10/24 Activity Comments  HEP review: -QS -SLR (supine/sidelying abd) -mini-squats   High step hurdles x 60 sec  Forwards/lateral  Coordination drills   Static multisensory balance                     PATIENT EDUCATION: Education details: HEP initiation and rationale Education method: Explanation Education comprehension: verbalized understanding  HOME EXERCISE PROGRAM: Access Code: [email or web address] URL: [email or web address] Date: 09/10/2024 Prepared by: Kelly Dvonte Gatliff  Exercises - Standing Bilateral Ankle Dorsiflexion AROM  - 2-3 x weekly - 3 sets - 10 reps - 2 sec hold - Marching with Resistance  - 1 x daily - 2-3 x weekly - 3 sets - 10 reps - Side Stepping with Resistance at Feet  - 1 x daily - 2-3 x weekly - 1-3 sets - 2 min hold - Supine Quad Set  - 1 x daily - 7 x weekly - 3 sets - 10 reps - Active Straight Leg Raise with Quad Set  - 1 x daily - 7 x weekly - 3 sets - 10 reps - Sidelying Hip Abduction  - 1 x daily - 7 x weekly - 3 sets - 10 reps - Mini Squat with Counter Support  - 1 x daily - 7 x weekly - 3 sets - 10 reps  Note: Objective measures were completed at Evaluation unless otherwise noted.  DIAGNOSTIC FINDINGS:   COGNITION: Overall cognitive status: Within functional limits for tasks assessed   SENSATION: Not tested  COORDINATION: Decreased speed LLE/LUE to rapid alternating movements  EDEMA:  none  MUSCLE TONE: NT     POSTURE: No Significant postural limitations  LOWER EXTREMITY ROM:     WNL  LOWER EXTREMITY MMT:    MMT Right Eval Left Eval  Hip flexion    Hip extension    Hip abduction    Hip adduction    Hip internal rotation    Hip external rotation    Knee flexion 5 5  Knee extension 5 5  Ankle dorsiflexion 5 4  Ankle plantarflexion 4 3+  Ankle inversion    Ankle eversion    (Blank rows = not  tested)  BED MOBILITY:  indep  TRANSFERS: indep   CURB:  Findings: indep  STAIRS: Independent but unsteady without HR GAIT: Findings: Comments: decreased left ankle DF/foot clearance with instances of circumduction  FUNCTIONAL TESTS:  5 times sit to stand: 14 sec Mini-BESTest: 22/28 Single leg stance: 2-3 sec LLE; 3-5 sec RLE M-CTSIB  Condition 1: Firm Surface, EO 30 Sec, Normal Sway  Condition 2: Firm Surface, EC 30 Sec, Normal Sway  Condition 3: Foam Surface, EO 30 Sec,  Normal Sway  Condition 4: Foam Surface, EC 18 Sec, Mild and Moderate Sway                                                                                                                                  TREATMENT DATE: 09/01/24     GOALS: Goals reviewed with patient? Yes  SHORT TERM GOALS: Target date: same as LTG   LONG TERM GOALS: Target date: 10/20/2024    Patient will be independent in HEP to improve functional outcomes Baseline:  Goal status: MET  2.  Improve Mini-BESTest score to 26/28 for improved balance and reduced fall risk Baseline: 22/28; 28/28 Goal status: MET  3.  Demo/report ability to lift/carry bulky/heavy items up/down stairs to improve safety at work/home Baseline: HR needed; able to do so Goal status: MET  4.  Improve single limb stance to 20 sec LLE/RLE to reduce risk for falls and improve single limb stance Baseline:  Goal status: MET  5.  Demo/report improved left ankle dorsiflexion for foot clearance during gait Baseline: reports frequent scuffing left foot/tripping Goal status: MET  6. TBD  Baseline: TBD  Goal status: NT   ASSESSMENT:  CLINICAL IMPRESSION: Pt returns to clinic and reports overall improved status, activity tolerance, balance, and knee pain.  Demo improved performance from 22 to 28/28 Mini-BESTest and improved single leg balance to 20 sec R/L indicating low risk for falls.  Exhibits left ankle/LE circumduction with faster walking but no  LOB or tripping.  Pt reports ability to perform work duties without issue as he can be assisted as needed.  Educated on importance of continued activity and means of improving knee function and improving gait pattern and verbalized understanding  OBJECTIVE IMPAIRMENTS: Abnormal gait, decreased activity tolerance, decreased balance, decreased endurance, difficulty walking, decreased strength, and pain.   ACTIVITY LIMITATIONS: carrying, lifting, stairs, and locomotion level  PARTICIPATION LIMITATIONS: cleaning, community activity, and occupation  PERSONAL FACTORS: Age, Time since onset of injury/illness/exacerbation, and 1-2 comorbidities: PMH are also affecting patient's functional outcome.   REHAB POTENTIAL: Excellent  CLINICAL DECISION MAKING: Evolving/moderate complexity  EVALUATION COMPLEXITY: Moderate  PLAN:  PT FREQUENCY: 1x/week  PT DURATION: 6 weeks  PLANNED INTERVENTIONS: 97750- Physical Performance Testing, 97110-Therapeutic exercises, 97530- Therapeutic activity, [id number]- Neuromuscular re-education, 97535- Self Care, [id number]- Manual therapy, [id number]- Gait training, [phone]- Orthotic Initial, [phone]- Orthotic/Prosthetic subsequent, [phone]- Canalith repositioning, [phone]- Aquatic Therapy, and Patient/Family education  PLAN FOR NEXT SESSION:D/C   8:01 AM, 10/17/24 M. Kelly Lavelle Akel, PT, DPT Physical Therapist- [place] Office Number: [phone]

## 2024-11-18 ENCOUNTER — Ambulatory Visit: Admitting: Adult Health

## 2024-12-10 ENCOUNTER — Ambulatory Visit: Admitting: Adult Health

## 2025-01-16 ENCOUNTER — Ambulatory Visit: Admitting: Adult Health

## 2025-02-11 ENCOUNTER — Ambulatory Visit: Admitting: Physical Medicine and Rehabilitation
